# Patient Record
Sex: Male | Born: 1950
Health system: Southern US, Community
[De-identification: ages and names within clinical notes are randomized; demographics above are authoritative.]

## PROBLEM LIST (undated history)

## (undated) ENCOUNTER — Ambulatory Visit (HOSPITAL_BASED_OUTPATIENT_CLINIC_OR_DEPARTMENT_OTHER)

## (undated) DIAGNOSIS — C679 Malignant neoplasm of bladder, unspecified: Secondary | ICD-10-CM

## (undated) DIAGNOSIS — Z86711 Personal history of pulmonary embolism: Secondary | ICD-10-CM

## (undated) DIAGNOSIS — I420 Dilated cardiomyopathy: Secondary | ICD-10-CM

## (undated) HISTORY — PX: PROSTATE SURGERY: SHX751

## (undated) HISTORY — PX: CATARACT EXTRACTION: SUR2

## (undated) HISTORY — PX: WISDOM TOOTH EXTRACTION: SHX21

## (undated) HISTORY — DX: Malignant neoplasm of bladder, unspecified: C67.9

## (undated) HISTORY — PX: OTHER SURGICAL HISTORY: SHX169

## (undated) HISTORY — PX: TONSILLECTOMY: SUR1361

## (undated) HISTORY — DX: Dilated cardiomyopathy: I42.0

## (undated) HISTORY — DX: Personal history of pulmonary embolism: Z86.711

---

## 2001-04-19 HISTORY — PX: INSERTION PROSTATE RADIATION SEED: SUR718

## 2001-08-29 ENCOUNTER — Encounter: Payer: Self-pay | Admitting: Cardiovascular Disease

## 2014-04-23 ENCOUNTER — Ambulatory Visit
Admit: 2014-04-23 | Discharge: 2014-04-23 | Disposition: A | Discharge status: Home / Self Care | Payer: Self-pay | Attending: Cardiology | Admitting: Cardiology

## 2014-04-23 ENCOUNTER — Ambulatory Visit: Payer: Self-pay | Admitting: Cardiology

## 2014-04-23 ENCOUNTER — Encounter: Payer: Self-pay | Admitting: Cardiology

## 2014-04-23 VITALS — BP 148/82 | HR 66 | Ht 71.0 in | Wt 232.0 lb

## 2014-04-23 DIAGNOSIS — I10 Essential (primary) hypertension: Secondary | ICD-10-CM | POA: Insufficient documentation

## 2014-04-23 DIAGNOSIS — R972 Elevated prostate specific antigen [PSA]: Secondary | ICD-10-CM | POA: Insufficient documentation

## 2014-04-23 DIAGNOSIS — I82409 Acute embolism and thrombosis of unspecified deep veins of unspecified lower extremity: Secondary | ICD-10-CM | POA: Insufficient documentation

## 2014-04-23 DIAGNOSIS — I429 Cardiomyopathy, unspecified: Secondary | ICD-10-CM

## 2014-04-23 DIAGNOSIS — I2699 Other pulmonary embolism without acute cor pulmonale: Secondary | ICD-10-CM

## 2014-04-23 DIAGNOSIS — M1610 Unilateral primary osteoarthritis, unspecified hip: Secondary | ICD-10-CM | POA: Insufficient documentation

## 2014-04-23 LAB — ECHO COMPLETE
AV CWD Velocity (Peak): 138 cm/s
Aortic Arch Diameter: 3.62 cm
Aortic Diameter (mid tubular): 3.36 cm
Aortic Diameter (sinus of Valsalva): 3.2 cm
BMI: 32.4 kg/m2
BSA: 2.3 m2
Height: 71 in
LA Systolic Diameter: 4.12 cm
LV Posterior Wall Thickness: 0.92 cm
LV Septal Thickness: 0.99 cm
LVED Diameter: 5.85 cm
LVES Diameter: 4.62 cm
MV Peak A Velocity: 75.3 cm/s
MV Peak E Velocity: 84.7 cm/s
Weight: 3712 oz

## 2014-04-23 LAB — TIBC
Iron: 100 ug/dL (ref 45–170)
TIBC: 312 ug/dL (ref 250–450)
Transferrin Saturation: 32 % (ref 20–55)

## 2014-04-23 LAB — FERRITIN: Ferritin: 326 ng/mL — ABNORMAL HIGH (ref 20–250)

## 2014-04-23 MED ORDER — CARVEDILOL 3.125 MG PO TABS *I*
3.1250 mg | ORAL_TABLET | Freq: Two times a day (BID) | ORAL | Status: DC
Start: 2014-04-23 — End: 2014-08-16

## 2014-04-23 NOTE — Progress Notes (Signed)
Cardiology Office Revisit Note    Date of Visit: 04/23/2014 Patient: John Hale   Patients PCP: Rochele Pageshangkakoti, Narendra C, MD Patient DOB: [birth year]     Subjective/Reason For Visit     I had the pleasure of seeing John Hale in cardiology followup on 04/23/2014.   This is a very pleasant 64 year old gentleman here for followup.  In November of 2014 he had massive bilateral pulmonary embolism.  An echocardiogram done at that time showed and ejection fraction of 35%.  He also had RV failure.  He had IVC filter placed and he also was anticoagulated.  He took Xarelto for 6 months.  His thrombophilia workup was done by a hematologist and apparently was negative.    He does have an elevated PSA and is currently undergoing further evaluation for prostate cancer.  He had a stress echocardiogram in January of 2015.  His LVEF was 45%.  He had normal augmentation of the left ventricle with exercise.  There was no evidence of ischemic heart disease.    The patient has been doing well.  He had to take down trees this summer and he was able to complete strenuous tasks without any chest pain or shortness of breath.  He denies any palpitations syncopal or presyncopal episodes.    His blood pressure he says is usually well controlled.  [place] recently [place] from [place] to [place] and he feels this has resulted in slightly high blood pressure now.      PMHx:    The patient's past medical history significant for hypertension, asthma, prostate cancer, and  DVT and massive bilateral pulmonary embolism.  The patient's past surgical history is noncontributory.    ROS  Medications     Current Outpatient Prescriptions   Medication Sig    ADVAIR DISKUS 100-50 MCG/DOSE diskus inhaler Inhale 1 puff into the lungs 2 times daily    BUDESONIDE PO Take by mouth    lisinopril-hydrochlorothiazide (PRINZIDE,ZESTORETIC) 10-12.5 MG per tablet Take 1 tablet by mouth every morning    rivaroxaban (XARELTO) 20 MG tablet Take 20 mg by mouth  daily     Vitals and Physical Exam     Quintan's  height is 1.803 m (5\' 11" ) and weight is 105.235 kg (232 lb). His blood pressure is 148/82 and his pulse is 66.  Body mass index is 32.37 kg/(m^2).    Physical Exam   HEENT/Neck: No JVD LAD TM Bruit  Lungs: Bil AE adequate. No crackles or rhonchi.  Heart: S1 S2 normal LVS4+  PA: Soft, non tender.  Ext: No c/c/e.  CNS: No focal deficits.    Cardiac/Imaging Data     ECHO:                   Impression and Plan     Patient Active Problem List   Diagnosis Code    HTN (hypertension) I10    Elevated PSA R97.2    DVT (deep venous thrombosis) I82.409    Pulmonary embolism I26.99       This is an 64 y.o. male with  1. Bil Massive PE. Treated with Xarelto for 6 months.  Clinically doing well.  He has an IVC filter for his DVT.  According to the patient thrombophilia workup was negative.  2. Cardiomyopathy. His cardiomyopathy is very likely stress induced.I will do an echocardiogram now to confirm this her liver function has normalized.  This is a lady function continues to be decreased  will do workup  for nonischemic adenopathy.  He is NYHA class I at this time.  I don't see a need for beta blocker therapy at this time, unless his LV function is persistently depressed.   3. HTN.   4. Asthma. Stable.  Echocardiogram today.  Follow up with me in one year.         Danielle Dess, MD  Electronically signed on 04/23/2014 at 9:22 AM.    Addendum:  ECHO today  Mildly dilated left ventricle with mild LV systolic dysfunction. LVEF 45% with grade 2 diastolic dysfunction.   No significant valvular abnormalities.   Dilated left atrium.    LV l function is not completely recovered.  I will initiate workup for nonischemic cardiomyopathy.  I will see him back in 3 months.  I am starting him on beta blocker therapy

## 2014-04-24 LAB — PROTEIN ELECTROPHORESIS, SERUM
A/G Ratio: 1.4 (ref 0.9–1.8)
Albumin: 4.4 g/dL (ref 3.5–5.1)
Alpha 1: 0.4 g/dL (ref 0.2–0.4)
Alpha 2: 0.9 g/dL (ref 0.4–0.9)
Beta: 0.8 g/dL (ref 0.5–1.0)
Gamma: 1 g/dL (ref 0.7–1.4)
Interp,PE: NORMAL
Total Protein: 7.5 g/dL (ref 6.3–7.7)

## 2014-04-24 LAB — ANTINUCLEAR ANTIBODY SCREEN: ANA Screen: NEGATIVE

## 2014-04-25 LAB — PE ELECT,REVIEW

## 2014-05-10 ENCOUNTER — Ambulatory Visit: Payer: Self-pay | Admitting: Cardiology

## 2014-08-16 ENCOUNTER — Encounter: Payer: Self-pay | Admitting: Cardiology

## 2014-08-16 ENCOUNTER — Ambulatory Visit: Payer: Self-pay | Admitting: Cardiology

## 2014-08-16 VITALS — BP 138/82 | HR 62 | Ht 70.5 in | Wt 231.6 lb

## 2014-08-16 DIAGNOSIS — I429 Cardiomyopathy, unspecified: Secondary | ICD-10-CM

## 2014-08-16 MED ORDER — CARVEDILOL 6.25 MG PO TABS *I*
6.2500 mg | ORAL_TABLET | Freq: Two times a day (BID) | ORAL | Status: DC
Start: 2014-08-16 — End: 2015-07-25

## 2014-08-16 NOTE — Progress Notes (Signed)
Cardiology Office Revisit Note    Date of Visit: 08/16/2014 Patient: John Hale   Patients PCP: Rochele Pages, MD Patient DOB: 06-23-1950     Subjective/Reason For Visit     I had the pleasure of seeing John Hale in cardiology followup on 08/16/2014.   Background:  1.  November of 2014 he had massive bilateral pulmonary embolism. An echocardiogram done at that time showed and ejection fraction of 35%. He also had RV failure. He had IVC filter placed and he also was anticoagulated. He took Xarelto for 6 months. His thrombophilia workup was done by a hematologist is reported negative.He does have an elevated PSA and is currently undergoing further evaluation for prostate cancer.  2. He had a stress echocardiogram in January of 2015. His LVEF was 45%. He had normal augmentation of the left ventricle with exercise. There was no evidence of ischemic heart disease.  3. LVEF remains 45%. NICM work up negative.(ANA, SPEP, Iron studies).    This is a very pleasant 64 year old gentleman here for follow-up of his cardiomyopathy.  The patient is overall doing really well.  He denies any chest pain or shortness of breath.  He is in the process of moving to [place].  He denies any palpitations syncopal or presyncopal episodes.  Nothing to suggest sleep apnea either.  ROS  Medications     Current Outpatient Prescriptions   Medication Sig    budesonide (PULMICORT) 90 MCG/ACT flexhaler Inhale 1 puff into the lungs 2 times daily    ADVAIR DISKUS 100-50 MCG/DOSE diskus inhaler Inhale 1 puff into the lungs 2 times daily    lisinopril-hydrochlorothiazide (PRINZIDE,ZESTORETIC) 10-12.5 MG per tablet Take 1 tablet by mouth every morning    carvedilol (COREG) 3.125 MG tablet Take 1 tablet (3.125 mg total) by mouth 2 times daily (with meals)    BUDESONIDE PO Take by mouth    rivaroxaban (XARELTO) 20 MG tablet Take 20 mg by mouth daily     Vitals and Physical Exam     Mckyle's  height is 1.791 m  (5' 10.5") and weight is 105.053 kg (231 lb 9.6 oz). His blood pressure is 138/82 and his pulse is 62. His oxygen saturation is 98%.  Body mass index is 32.75 kg/(m^2).    Physical Exam   HEENT/Neck: No JVD LAD TM Bruit  Lungs: Bil AE adequate. No crackles or rhonchi.  Heart: S1 S2 normal No murmur or rub. No additional sounds.  PA: Soft, non tender.  Ext: No c/c/e.  CNS: No focal deficits.    Cardiac/Imaging Data          Echo Complete 04/23/2014    Narrative  Mildly dilated left ventricle with mild LV systolic dysfunction. LVEF   45% with grade 2 diastolic dysfunction.   No significant valvular abnormalities.   Dilated left atrium.           Impression and Plan     Patient Active Problem List   Diagnosis Code    HTN (hypertension) I10    Elevated PSA R97.2    DVT (deep venous thrombosis) I82.409    Pulmonary embolism I26.99       This is an 64 y.o. male with   1. Bil Massive PE. Treated with Xarelto for 6 months. Clinically doing well. He has an IVC filter for his DVT. Thrombophilia workup per his hematologist has been negative. His right ventricle function has normalized.  2. Cardiomyopathy.He had a significant LV systolic dysfunction diagnosis at  the time of his massive pulmonary embolism.  His ejection fraction was as low as 35%.  It has improved up to 45%.  He is on lisinopril and carvedilol.  I'm increasing his carvedilol to 6.25 mg twice a day.  He is euvolemic and NYHA class I without any symptoms of dyspnea on extreme exertion.  As far as his cardiomyopathy workup is concerned, he said a negative stress echocardiogram and suspicion for ischemic cardiomyopathy is very low. Nonischemic workup so far negative.  If his ejection fraction does not improve I was considering doing an MRI.  Since [place] is moving to [place] he can pursue this when he establishes with a primary care physician there.  3. HTN. Well-controlled.   4. Asthma. Stable.       Danielle DessVijay Breyson Kelm, MD  Electronically signed on  08/16/2014 at 12:09 PM.

## 2014-08-19 ENCOUNTER — Other Ambulatory Visit: Payer: Self-pay | Admitting: Urology

## 2015-07-25 ENCOUNTER — Other Ambulatory Visit: Payer: Self-pay | Admitting: Cardiology

## 2015-07-25 DIAGNOSIS — I429 Cardiomyopathy, unspecified: Secondary | ICD-10-CM

## 2015-11-28 DIAGNOSIS — J4520 Mild intermittent asthma, uncomplicated: Secondary | ICD-10-CM | POA: Diagnosis not present

## 2015-11-28 DIAGNOSIS — Z1389 Encounter for screening for other disorder: Secondary | ICD-10-CM | POA: Diagnosis not present

## 2015-11-28 DIAGNOSIS — C61 Malignant neoplasm of prostate: Secondary | ICD-10-CM | POA: Diagnosis not present

## 2015-11-28 DIAGNOSIS — Z9181 History of falling: Secondary | ICD-10-CM | POA: Diagnosis not present

## 2015-11-28 DIAGNOSIS — I10 Essential (primary) hypertension: Secondary | ICD-10-CM | POA: Diagnosis not present

## 2015-12-12 DIAGNOSIS — R9721 Rising PSA following treatment for malignant neoplasm of prostate: Secondary | ICD-10-CM | POA: Diagnosis not present

## 2015-12-12 DIAGNOSIS — C61 Malignant neoplasm of prostate: Secondary | ICD-10-CM | POA: Diagnosis not present

## 2015-12-12 DIAGNOSIS — N3000 Acute cystitis without hematuria: Secondary | ICD-10-CM | POA: Diagnosis not present

## 2015-12-18 DIAGNOSIS — I420 Dilated cardiomyopathy: Secondary | ICD-10-CM | POA: Diagnosis not present

## 2015-12-18 DIAGNOSIS — I10 Essential (primary) hypertension: Secondary | ICD-10-CM | POA: Insufficient documentation

## 2015-12-18 DIAGNOSIS — Z86711 Personal history of pulmonary embolism: Secondary | ICD-10-CM | POA: Insufficient documentation

## 2015-12-18 HISTORY — DX: Essential (primary) hypertension: I10

## 2015-12-18 HISTORY — DX: Personal history of pulmonary embolism: Z86.711

## 2015-12-24 DIAGNOSIS — S83204A Other tear of unspecified meniscus, current injury, left knee, initial encounter: Secondary | ICD-10-CM | POA: Diagnosis not present

## 2015-12-29 DIAGNOSIS — S8992XA Unspecified injury of left lower leg, initial encounter: Secondary | ICD-10-CM | POA: Diagnosis not present

## 2016-01-01 DIAGNOSIS — C61 Malignant neoplasm of prostate: Secondary | ICD-10-CM | POA: Diagnosis not present

## 2016-01-01 DIAGNOSIS — R9721 Rising PSA following treatment for malignant neoplasm of prostate: Secondary | ICD-10-CM | POA: Diagnosis not present

## 2016-01-02 DIAGNOSIS — I10 Essential (primary) hypertension: Secondary | ICD-10-CM | POA: Diagnosis not present

## 2016-01-02 DIAGNOSIS — Z86711 Personal history of pulmonary embolism: Secondary | ICD-10-CM | POA: Diagnosis not present

## 2016-01-02 DIAGNOSIS — I420 Dilated cardiomyopathy: Secondary | ICD-10-CM | POA: Diagnosis not present

## 2016-01-07 DIAGNOSIS — C61 Malignant neoplasm of prostate: Secondary | ICD-10-CM | POA: Diagnosis not present

## 2016-01-07 DIAGNOSIS — M5137 Other intervertebral disc degeneration, lumbosacral region: Secondary | ICD-10-CM | POA: Diagnosis not present

## 2016-01-21 ENCOUNTER — Other Ambulatory Visit (HOSPITAL_COMMUNITY): Payer: Self-pay | Admitting: Radiation Oncology

## 2016-01-21 DIAGNOSIS — C61 Malignant neoplasm of prostate: Secondary | ICD-10-CM

## 2016-01-23 DIAGNOSIS — I420 Dilated cardiomyopathy: Secondary | ICD-10-CM | POA: Diagnosis not present

## 2016-01-23 DIAGNOSIS — Z86711 Personal history of pulmonary embolism: Secondary | ICD-10-CM | POA: Diagnosis not present

## 2016-01-23 DIAGNOSIS — I10 Essential (primary) hypertension: Secondary | ICD-10-CM | POA: Diagnosis not present

## 2016-01-28 ENCOUNTER — Encounter (HOSPITAL_COMMUNITY): Admission: RE | Admit: 2016-01-28 | Payer: Medicare Other | Source: Ambulatory Visit

## 2016-01-28 ENCOUNTER — Other Ambulatory Visit (HOSPITAL_COMMUNITY): Payer: Self-pay | Admitting: Radiation Oncology

## 2016-01-28 DIAGNOSIS — C61 Malignant neoplasm of prostate: Secondary | ICD-10-CM

## 2016-02-10 ENCOUNTER — Ambulatory Visit (HOSPITAL_COMMUNITY): Admission: RE | Admit: 2016-02-10 | Payer: Medicare Other | Source: Ambulatory Visit

## 2016-02-16 DIAGNOSIS — Z86711 Personal history of pulmonary embolism: Secondary | ICD-10-CM | POA: Diagnosis not present

## 2016-02-16 DIAGNOSIS — I10 Essential (primary) hypertension: Secondary | ICD-10-CM | POA: Diagnosis not present

## 2016-02-16 DIAGNOSIS — I420 Dilated cardiomyopathy: Secondary | ICD-10-CM | POA: Diagnosis not present

## 2016-02-19 ENCOUNTER — Ambulatory Visit (HOSPITAL_COMMUNITY)
Admission: RE | Admit: 2016-02-19 | Discharge: 2016-02-19 | Disposition: A | Discharge status: Home / Self Care | Payer: Medicare Other | Source: Ambulatory Visit | Attending: Radiation Oncology | Admitting: Radiation Oncology

## 2016-02-19 DIAGNOSIS — C61 Malignant neoplasm of prostate: Secondary | ICD-10-CM | POA: Insufficient documentation

## 2016-02-19 DIAGNOSIS — R9721 Rising PSA following treatment for malignant neoplasm of prostate: Secondary | ICD-10-CM | POA: Diagnosis not present

## 2016-03-05 DIAGNOSIS — I10 Essential (primary) hypertension: Secondary | ICD-10-CM | POA: Diagnosis not present

## 2016-04-01 DIAGNOSIS — Z86711 Personal history of pulmonary embolism: Secondary | ICD-10-CM | POA: Diagnosis not present

## 2016-04-01 DIAGNOSIS — I420 Dilated cardiomyopathy: Secondary | ICD-10-CM | POA: Diagnosis not present

## 2016-04-01 DIAGNOSIS — I10 Essential (primary) hypertension: Secondary | ICD-10-CM | POA: Diagnosis not present

## 2016-04-13 DIAGNOSIS — C61 Malignant neoplasm of prostate: Secondary | ICD-10-CM | POA: Diagnosis not present

## 2016-04-13 DIAGNOSIS — N3000 Acute cystitis without hematuria: Secondary | ICD-10-CM | POA: Diagnosis not present

## 2016-04-13 DIAGNOSIS — R972 Elevated prostate specific antigen [PSA]: Secondary | ICD-10-CM | POA: Diagnosis not present

## 2016-08-06 DIAGNOSIS — C61 Malignant neoplasm of prostate: Secondary | ICD-10-CM | POA: Diagnosis not present

## 2016-08-06 DIAGNOSIS — Z0000 Encounter for general adult medical examination without abnormal findings: Secondary | ICD-10-CM | POA: Diagnosis not present

## 2016-08-06 DIAGNOSIS — I10 Essential (primary) hypertension: Secondary | ICD-10-CM | POA: Diagnosis not present

## 2016-09-03 DIAGNOSIS — Z86711 Personal history of pulmonary embolism: Secondary | ICD-10-CM | POA: Diagnosis not present

## 2016-09-03 DIAGNOSIS — I10 Essential (primary) hypertension: Secondary | ICD-10-CM | POA: Diagnosis not present

## 2016-09-03 DIAGNOSIS — I420 Dilated cardiomyopathy: Secondary | ICD-10-CM | POA: Diagnosis not present

## 2016-09-07 DIAGNOSIS — I420 Dilated cardiomyopathy: Secondary | ICD-10-CM | POA: Diagnosis not present

## 2016-10-12 DIAGNOSIS — N3000 Acute cystitis without hematuria: Secondary | ICD-10-CM | POA: Diagnosis not present

## 2016-10-12 DIAGNOSIS — R972 Elevated prostate specific antigen [PSA]: Secondary | ICD-10-CM | POA: Diagnosis not present

## 2016-10-12 DIAGNOSIS — C61 Malignant neoplasm of prostate: Secondary | ICD-10-CM | POA: Diagnosis not present

## 2017-01-10 IMAGING — PT NM PET TUM IMG RESTAG (PS) SKULL BASE T - THIGH
1 of 7 series · 3 of 25 positions shown · non-contrast
Comparison: None.

CLINICAL DATA: Prostate carcinoma with rising PSA. Brachytherapy
seed implantation in the prostate gland (7448).

EXAM:
NUCLEAR MEDICINE PET SKULL BASE TO THIGH
TECHNIQUE: 10.3 mCi F-18 Fluciclovine was injected intravenously. Full-ring PET
imaging was performed from the skull base to thigh after the
radiotracer. CT data was obtained and used for attenuation
correction and anatomic localization.

[Series 4: ct sk_thigh 5.0 b31f · axial · 5.0mm · 0.98mm/px · z∈[+988,+1372]mm · 3 of 240 slices shown]
[im 48/240  brain]
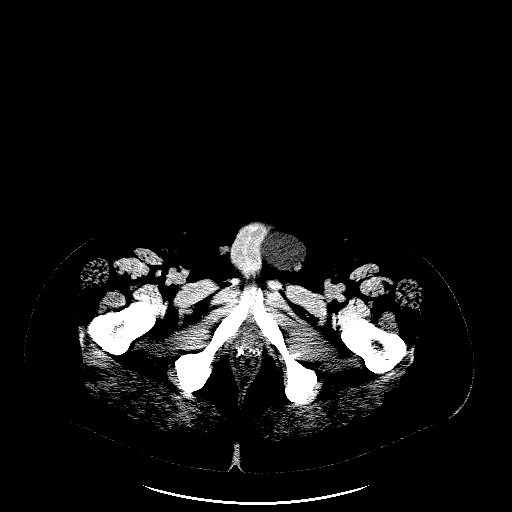
[im 96/240  brain]
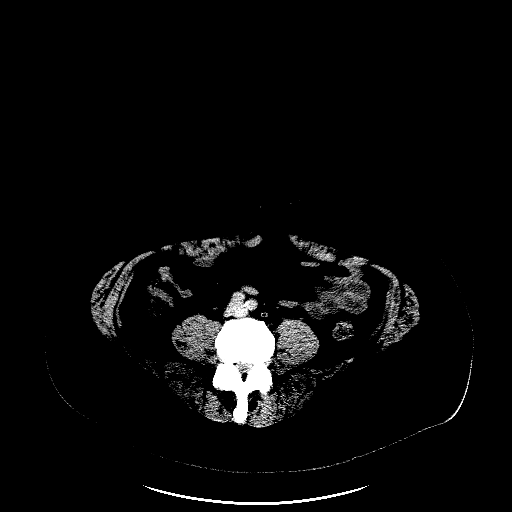
[im 144/240  brain]
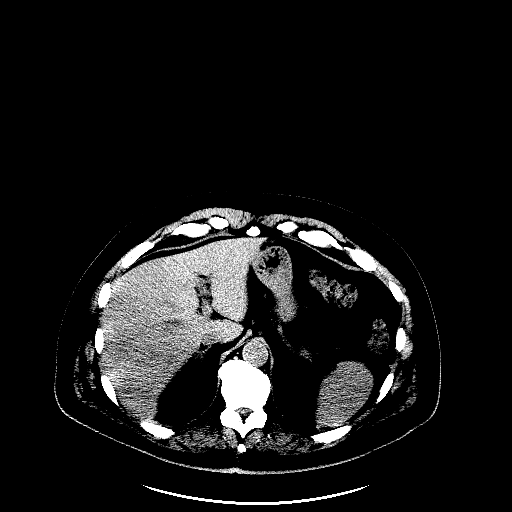

[3 of 25 positions shown; findings below may reference images not displayed]

FINDINGS: NECK

No radiotracer activity in neck lymph nodes.

CHEST

No radiotracer accumulation within mediastinal or hilar lymph nodes.
No suspicious pulmonary nodules on the CT scan. Symmetric
gynecomastia noted.

ABDOMEN/PELVIS

No significant metabolic activity in the prostate bed. Brachytherapy
seeds noted. Small amount of activity at the most inferior aspect of
the prostate / urethra is is favored physiologic.

No accumulation radiotracer in pelvic lymph nodes. No accumulates
radiotracer within periaortic lymph nodes. No liver metastasis.
Physiologic activity noted within the liver and pancreas.

Infrarenal IVC filter noted.  Large LEFT hydrocele noted.

SKELETON

No focal activity to suggest skeletal metastasis. This intense
uptake within the muscles. Query recent exercise.
IMPRESSION: 1. No evidence of local recurrence in the prostate gland.
2. No evidence of nodal metastasis pelvis.
3. No evidence distant loss of disease.
4. No evidence skeletal metastasis.

## 2017-04-14 ENCOUNTER — Other Ambulatory Visit: Payer: Self-pay | Admitting: Cardiology

## 2017-04-21 DIAGNOSIS — C61 Malignant neoplasm of prostate: Secondary | ICD-10-CM | POA: Diagnosis not present

## 2017-04-21 DIAGNOSIS — N3020 Other chronic cystitis without hematuria: Secondary | ICD-10-CM | POA: Diagnosis not present

## 2017-04-22 ENCOUNTER — Ambulatory Visit (HOSPITAL_BASED_OUTPATIENT_CLINIC_OR_DEPARTMENT_OTHER)
Admission: RE | Admit: 2017-04-22 | Discharge: 2017-04-22 | Disposition: A | Discharge status: Home / Self Care | Payer: Medicare Other | Source: Ambulatory Visit | Attending: Cardiology | Admitting: Cardiology

## 2017-04-22 ENCOUNTER — Ambulatory Visit (INDEPENDENT_AMBULATORY_CARE_PROVIDER_SITE_OTHER): Payer: Medicare Other | Admitting: Cardiology

## 2017-04-22 ENCOUNTER — Ambulatory Visit (HOSPITAL_BASED_OUTPATIENT_CLINIC_OR_DEPARTMENT_OTHER): Payer: Medicare Other

## 2017-04-22 ENCOUNTER — Encounter: Payer: Self-pay | Admitting: Cardiology

## 2017-04-22 VITALS — BP 120/66 | HR 68 | Ht 71.0 in | Wt 239.8 lb

## 2017-04-22 DIAGNOSIS — I10 Essential (primary) hypertension: Secondary | ICD-10-CM

## 2017-04-22 DIAGNOSIS — I82412 Acute embolism and thrombosis of left femoral vein: Secondary | ICD-10-CM | POA: Diagnosis not present

## 2017-04-22 DIAGNOSIS — Z86718 Personal history of other venous thrombosis and embolism: Secondary | ICD-10-CM

## 2017-04-22 DIAGNOSIS — I420 Dilated cardiomyopathy: Secondary | ICD-10-CM | POA: Diagnosis not present

## 2017-04-22 DIAGNOSIS — Z86711 Personal history of pulmonary embolism: Secondary | ICD-10-CM | POA: Diagnosis not present

## 2017-04-22 HISTORY — DX: Personal history of other venous thrombosis and embolism: Z86.718

## 2017-04-22 MED ORDER — RIVAROXABAN 20 MG PO TABS: 20.0000 mg | ORAL_TABLET | Freq: Every day | ORAL | 6 refills | Status: DC

## 2017-04-22 NOTE — Progress Notes (Addendum)
Cardiology Office Note:    Date:  04/22/2017   ID:  Allen Macdonald, DOB 1951-04-05, MRN [id number]  PCP:  Ronita Hipps, MD  Cardiologist:  Jenne Campus, MD    Referring MD: Ronita Hipps, MD   Chief Complaint  Patient presents with  . Follow-up  I am doing well  History of Present Illness:    Allen Macdonald is a 67 y.o. male with history of dilated cardiomyopathy.  He is on ACE inhibitor as well beta-blocker.  Comes for regular follow-up.  Overall he said he is doing fine however about 2 weeks ago he noted swelling of left lower extremity with some kind of strength sensation in his leg.  Years ago he had a DVT in this leg and had fairly similar symptoms.  He actually ended up having complication of DVT.  He did have PE.  He was anticoagulated for about a year after that.  [place] filter has been implanted and since that time he has been doing well.  Luckily denies having any shortness of breath, denies having any pleuritic chest pain.  Denies having any proximal nocturnal dyspnea no palpitations no dizziness  Past Medical History:  Diagnosis Date  . Dilated cardiomyopathy ([place])   . History of pulmonary embolus ([place])       Current Medications: Current Meds  Medication Sig  . bicalutamide (CASODEX) 50 MG tablet Take 50 mg by mouth daily.  . carvedilol (COREG) 6.25 MG tablet Take 6.25 mg by mouth 2 (two) times daily.  . fluticasone (FLOVENT HFA) 110 MCG/ACT inhaler Inhale 1 puff into the lungs 2 (two) times daily.  Marland Kitchen lisinopril (PRINIVIL,ZESTRIL) 20 MG tablet TAKE ONE TABLET EVERY DAY     Allergies:   Patient has no known allergies.   Social History   Socioeconomic History  . Marital status: Married    Spouse name: None  . Number of children: None  . Years of education: None  . Highest education level: None  Social Needs  . Financial resource strain: None  . Food insecurity - worry: None  . Food insecurity - inability: None  . Transportation needs - medical:  None  . Transportation needs - non-medical: None  Occupational History  . None  Tobacco Use  . Smoking status: Never Smoker  . Smokeless tobacco: Never Used  Substance and Sexual Activity  . Alcohol use: Yes  . Drug use: No  . Sexual activity: None  Other Topics Concern  . None  Social History Narrative  . None     Family History: The patient's family history includes Heart failure in his mother; Pleurisy in his mother. ROS:   Please see the history of present illness.    All 14 point review of systems negative except as described per history of present illness  EKGs/Labs/Other Studies Reviewed:      Recent Labs: No results found for requested labs within last 8760 hours.  Recent Lipid Panel No results found for: CHOL, TRIG, HDL, CHOLHDL, VLDL, LDLCALC, LDLDIRECT  Physical Exam:    VS:  BP 120/66   Pulse 68   Ht 5\' 11"  (1.803 m)   Wt 239 lb 12.8 oz (108.8 kg)   SpO2 96%   BMI 33.45 kg/m     Wt Readings from Last 3 Encounters:  04/22/17 239 lb 12.8 oz (108.8 kg)     GEN:  Well nourished, well developed in no acute distress HEENT: Normal NECK: No JVD; No carotid bruits LYMPHATICS: No lymphadenopathy CARDIAC:  RRR, no murmurs, no rubs, no gallops RESPIRATORY:  Clear to auscultation without rales, wheezing or rhonchi  ABDOMEN: Soft, non-tender, non-distended MUSCULOSKELETAL:  No edema; No deformity  SKIN: Warm and dry LOWER EXTREMITIES: His left leg got 2+ pitting edema.  There is some tenderness in the calf NEUROLOGIC:  Alert and oriented x 3 PSYCHIATRIC:  Normal affect   ASSESSMENT:    1. Dilated cardiomyopathy ([place])   2. Essential hypertension   3. History of pulmonary embolism   4. History of DVT (deep vein thrombosis)    PLAN:    In order of problems listed above:  1. Dilated cardiomyopathy: On ACE inhibitor and beta-blocker.  In May he had echocardiogram done in our old office will try to retrieve the results of the test.  He was not informed  about the results of the test.  If his ejection fraction is normal will continue present management if not we may consider switching him from ACE inhibitor to [facility]. 2. Essential hypertension: Blood pressure appears to be well controlled continue present management. 3. History of pulmonary emboli years ago.  Stable from that standpoint review. 4. History of deep venous thrombosis in the left lower extremities now new symptoms with swelling as well as pain in left calf.  That the calf is obviously swollen.  We must rule out DVT will do stat ultrasound study of this leg.  If it is positive will initiate his Xarelto 15 mg twice daily.  I will check his Chem-7 today make sure that the dose of this medication will be appropriate.  D-dimer will be done as well.   Of lower extremity was performed which showed acute and some chronic DVT.  I start him on Xarelto 15 mg twice daily.  I will send prescription for 20 mg daily thereafter.  I see him back in 1 month.  Laboratory tests are still pending  Medication Adjustments/Labs and Tests Ordered: Current medicines are reviewed at length with the patient today.  Concerns regarding medicines are outlined above.  No orders of the defined types were placed in this encounter.  Medication changes: No orders of the defined types were placed in this encounter.   Signed, Park Liter, MD, [facility] 04/22/2017 9:57 AM    [place]

## 2017-04-22 NOTE — Progress Notes (Signed)
  Lower extremity venous duplex. Unilateral (left) Positive for DVT. Dr. Agustin Cree was notified/ Patient was informed.  Joelene Millin 04/22/2017, 1:18 PM

## 2017-04-22 NOTE — Patient Instructions (Signed)
Medication Instructions:  Your physician has recommended you make the following change in your medication: Pending results from labs, Echo from our old office and your vascular study  Labwork: Your physician recommends that you have lab work today: BMP, CBC, and D-Dimer  Testing/Procedures: Your physician has requested that you have a lower or upper extremity venous duplex. This test is an ultrasound of the veins in the legs or arms. It looks at venous blood flow that carries blood from the heart to the legs or arms. Allow one hour for a Lower Venous exam. Allow thirty minutes for an Upper Venous exam. There are no restrictions or special instructions.  Follow-Up: Your physician recommends that you schedule a follow-up appointment in: 1 month with Dr. Agustin Cree  Any Other Special Instructions Will Be Listed Below (If Applicable).     If you need a refill on your cardiac medications before your next appointment, please call your pharmacy.

## 2017-04-22 NOTE — Addendum Note (Signed)
Addended by: Jenne Campus on: 04/22/2017 01:39 PM   Modules accepted: Orders

## 2017-04-22 NOTE — Addendum Note (Signed)
Addended by: Aleatha Borer on: 04/22/2017 10:18 AM   Modules accepted: Orders

## 2017-04-23 LAB — BASIC METABOLIC PANEL
BUN / CREAT RATIO: 16 (ref 10–24)
BUN: 18 mg/dL (ref 8–27)
CO2: 21 mmol/L (ref 20–29)
CREATININE: 1.15 mg/dL (ref 0.76–1.27)
Calcium: 9.8 mg/dL (ref 8.6–10.2)
Chloride: 103 mmol/L (ref 96–106)
GFR calc Af Amer: 75 mL/min/{1.73_m2} (ref >59)
GFR, EST NON AFRICAN AMERICAN: 65 mL/min/{1.73_m2} (ref >59)
GLUCOSE: 95 mg/dL (ref 65–99)
Potassium: 5.2 mmol/L (ref 3.5–5.2)
Sodium: 141 mmol/L (ref 134–144)

## 2017-04-23 LAB — D-DIMER, QUANTITATIVE: D-DIMER: 3.82 mg/L FEU — ABNORMAL HIGH (ref 0.00–0.49)

## 2017-04-23 LAB — CBC
HEMOGLOBIN: 13.5 g/dL (ref 13.0–17.7)
Hematocrit: 39.2 % (ref 37.5–51.0)
MCH: 32.5 pg (ref 26.6–33.0)
MCHC: 34.4 g/dL (ref 31.5–35.7)
MCV: 95 fL (ref 79–97)
Platelets: 164 10*3/uL (ref 150–379)
RBC: 4.15 x10E6/uL (ref 4.14–5.80)
RDW: 14 % (ref 12.3–15.4)
WBC: 6.7 10*3/uL (ref 3.4–10.8)

## 2017-04-26 ENCOUNTER — Other Ambulatory Visit: Payer: Self-pay | Admitting: Cardiology

## 2017-04-27 DIAGNOSIS — I10 Essential (primary) hypertension: Secondary | ICD-10-CM | POA: Diagnosis not present

## 2017-04-27 DIAGNOSIS — I82409 Acute embolism and thrombosis of unspecified deep veins of unspecified lower extremity: Secondary | ICD-10-CM | POA: Diagnosis not present

## 2017-04-27 DIAGNOSIS — Z1331 Encounter for screening for depression: Secondary | ICD-10-CM | POA: Diagnosis not present

## 2017-04-27 DIAGNOSIS — Z9181 History of falling: Secondary | ICD-10-CM | POA: Diagnosis not present

## 2017-04-27 DIAGNOSIS — J4520 Mild intermittent asthma, uncomplicated: Secondary | ICD-10-CM | POA: Diagnosis not present

## 2017-05-16 ENCOUNTER — Encounter: Payer: Self-pay | Admitting: Cardiology

## 2017-05-16 ENCOUNTER — Ambulatory Visit: Payer: Medicare Other | Admitting: Cardiology

## 2017-05-16 VITALS — BP 124/70 | HR 55 | Ht 71.0 in | Wt 239.0 lb

## 2017-05-16 DIAGNOSIS — Z86711 Personal history of pulmonary embolism: Secondary | ICD-10-CM

## 2017-05-16 DIAGNOSIS — I10 Essential (primary) hypertension: Secondary | ICD-10-CM

## 2017-05-16 DIAGNOSIS — Z86718 Personal history of other venous thrombosis and embolism: Secondary | ICD-10-CM

## 2017-05-16 DIAGNOSIS — I420 Dilated cardiomyopathy: Secondary | ICD-10-CM

## 2017-05-16 MED ORDER — RIVAROXABAN 20 MG PO TABS: 20.0000 mg | ORAL_TABLET | Freq: Every day | ORAL | 3 refills | Status: DC

## 2017-05-16 NOTE — Patient Instructions (Signed)
Medication Instructions:  Your physician recommends that you continue on your current medications as directed. Please refer to the Current Medication list given to you today.  Labwork: None ordered  Testing/Procedures: None ordered  Follow-Up: Your physician recommends that you schedule a follow-up appointment in: 3 months with Dr. Krasowski   Any Other Special Instructions Will Be Listed Below (If Applicable).     If you need a refill on your cardiac medications before your next appointment, please call your pharmacy.   

## 2017-05-16 NOTE — Addendum Note (Signed)
Addended by: Aleatha Borer on: 05/16/2017 10:41 AM   Modules accepted: Orders

## 2017-05-16 NOTE — Progress Notes (Signed)
Cardiology Office Note:    Date:  05/16/2017   ID:  Allen Macdonald, DOB 10/03/1950, MRN [id number]  PCP:  Ronita Hipps, MD  Cardiologist:  Jenne Campus, MD    Referring MD: Ronita Hipps, MD   Chief Complaint  Patient presents with  . Follow-up    1 month flup after venous doppler  Doing well  History of Present Illness:    Allen Macdonald is a 67 y.o. male with history of central recognize acute DVT.  He did have DVT and PE couple years ago but this is a new occurrence of DVT.  Is being appropriately treated with anticoagulation and doing well.  His leg is not hurting any more he walks climb stairs with no difficulties no chest pain tightness squeezing pressure burning in chest.  Past Medical History:  Diagnosis Date  . Dilated cardiomyopathy ([place])   . History of pulmonary embolus ([place])     Past Surgical History:  Procedure Laterality Date  . PROSTATE SURGERY    . Venacava Filter      Current Medications: Current Meds  Medication Sig  . bicalutamide (CASODEX) 50 MG tablet Take 50 mg by mouth daily.  . carvedilol (COREG) 6.25 MG tablet Take 6.25 mg by mouth 2 (two) times daily.  . fluticasone (FLOVENT HFA) 110 MCG/ACT inhaler Inhale 1 puff into the lungs 2 (two) times daily.  Marland Kitchen lisinopril (PRINIVIL,ZESTRIL) 20 MG tablet TAKE ONE TABLET BY MOUTH EVERY DAY  . rivaroxaban (XARELTO) 20 MG TABS tablet Take 1 tablet (20 mg total) by mouth daily with supper.     Allergies:   Patient has no known allergies.   Social History   Socioeconomic History  . Marital status: Married    Spouse name: None  . Number of children: None  . Years of education: None  . Highest education level: None  Social Needs  . Financial resource strain: None  . Food insecurity - worry: None  . Food insecurity - inability: None  . Transportation needs - medical: None  . Transportation needs - non-medical: None  Occupational History  . None  Tobacco Use  . Smoking status: Never Smoker    . Smokeless tobacco: Never Used  Substance and Sexual Activity  . Alcohol use: Yes  . Drug use: No  . Sexual activity: None  Other Topics Concern  . None  Social History Narrative  . None     Family History: The patient's family history includes Heart failure in his mother; Pleurisy in his mother. ROS:   Please see the history of present illness.    All 14 point review of systems negative except as described per history of present illness  EKGs/Labs/Other Studies Reviewed:      Recent Labs: 04/22/2017: BUN 18; Creatinine, Ser 1.15; Hemoglobin 13.5; Platelets 164; Potassium 5.2; Sodium 141  Recent Lipid Panel No results found for: CHOL, TRIG, HDL, CHOLHDL, VLDL, LDLCALC, LDLDIRECT  Physical Exam:    VS:  BP 124/70 (BP Location: Left Arm, Patient Position: Sitting, Cuff Size: Large)   Pulse (!) 55   Ht 5\' 11"  (1.803 m)   Wt 239 lb (108.4 kg)   SpO2 98%   BMI 33.33 kg/m     Wt Readings from Last 3 Encounters:  05/16/17 239 lb (108.4 kg)  04/22/17 239 lb 12.8 oz (108.8 kg)     GEN:  Well nourished, well developed in no acute distress HEENT: Normal NECK: No JVD; No carotid bruits LYMPHATICS: No lymphadenopathy CARDIAC:  RRR, no murmurs, no rubs, no gallops RESPIRATORY:  Clear to auscultation without rales, wheezing or rhonchi  ABDOMEN: Soft, non-tender, non-distended MUSCULOSKELETAL:  No edema; No deformity  SKIN: Warm and dry LOWER EXTREMITIES: no swelling NEUROLOGIC:  Alert and oriented x 3 PSYCHIATRIC:  Normal affect   ASSESSMENT:    1. Dilated cardiomyopathy ([place])   2. Essential hypertension   3. History of DVT (deep vein thrombosis)   4. History of pulmonary embolism    PLAN:    In order of problems listed above:  1. Dilated cardia myopathy: Still I do not have a report of his echocardiogram from May we will try to re-send request for him again.  He is on ACE inhibitor and beta-blocker which I will continue 2. Essential hypertension: Blood pressure  well controlled. 3. Acute DVT.  On anticoagulation which I will continue.  Now since we have unprovoked DVT he need to be on anticoagulation indefinitely   Medication Adjustments/Labs and Tests Ordered: Current medicines are reviewed at length with the patient today.  Concerns regarding medicines are outlined above.  No orders of the defined types were placed in this encounter.  Medication changes: No orders of the defined types were placed in this encounter.   Signed, Park Liter, MD, [facility] 05/16/2017 10:18 AM    [place]

## 2017-06-21 ENCOUNTER — Telehealth: Payer: Self-pay | Admitting: *Deleted

## 2017-06-21 NOTE — Telephone Encounter (Signed)
Left message with wife, Santiago Glad, to have patient call back.

## 2017-06-21 NOTE — Telephone Encounter (Signed)
Patient and wife, Santiago Glad, informed about [facility] financial assistance program for xarelto and where to go for more information. Both verbalized understanding. No further questions. Scheduled follow up appointment for 08/15/17.

## 2017-08-01 ENCOUNTER — Telehealth: Payer: Self-pay | Admitting: Cardiology

## 2017-08-01 NOTE — Telephone Encounter (Signed)
Dr. Agustin Cree advised to continue xarelto. Patient informed. No further questions.

## 2017-08-01 NOTE — Telephone Encounter (Signed)
Patient is having tooth extraction tomorrow, 08/02/2017 and wants to make sure that is okay with him taking xarelto 20 mg daily. Will have Dr. Agustin Cree advise.

## 2017-08-01 NOTE — Telephone Encounter (Signed)
Please contact patient about dental surgery

## 2017-08-11 DIAGNOSIS — K048 Radicular cyst: Secondary | ICD-10-CM | POA: Diagnosis not present

## 2017-08-11 DIAGNOSIS — M279 Disease of jaws, unspecified: Secondary | ICD-10-CM | POA: Diagnosis not present

## 2017-08-15 ENCOUNTER — Ambulatory Visit: Payer: Medicare Other | Admitting: Cardiology

## 2017-08-15 ENCOUNTER — Encounter: Payer: Self-pay | Admitting: Cardiology

## 2017-08-15 VITALS — BP 110/60 | HR 64 | Ht 71.0 in | Wt 235.8 lb

## 2017-08-15 DIAGNOSIS — Z86711 Personal history of pulmonary embolism: Secondary | ICD-10-CM

## 2017-08-15 DIAGNOSIS — Z86718 Personal history of other venous thrombosis and embolism: Secondary | ICD-10-CM | POA: Diagnosis not present

## 2017-08-15 DIAGNOSIS — I420 Dilated cardiomyopathy: Secondary | ICD-10-CM | POA: Diagnosis not present

## 2017-08-15 MED ORDER — RIVAROXABAN 20 MG PO TABS: 20.0000 mg | ORAL_TABLET | Freq: Every day | ORAL | 3 refills | Status: DC

## 2017-08-15 NOTE — Progress Notes (Signed)
Cardiology Office Note:    Date:  08/15/2017   ID:  Allen Macdonald, DOB [birth year], MRN [id number]  PCP:  Ronita Hipps, MD  Cardiologist:  Jenne Campus, MD    Referring MD: Ronita Hipps, MD   Chief Complaint  Patient presents with  . Follow-up  Doing well  History of Present Illness:    Allen Macdonald is a 67 y.o. male with cardia myopathy ejection fraction 45 to 50% last May.  On beta-blocker ACE inhibitor.  Also history of DVT as well as PE.  Overall seems to be doing clinically well no tightness squeezing pressure burning chest works a lot in the garden with no difficulties.  Past Medical History:  Diagnosis Date  . Dilated cardiomyopathy ([place])   . History of pulmonary embolus (PE)     Past Surgical History:  Procedure Laterality Date  . PROSTATE SURGERY    . Venacava Filter      Current Medications: Current Meds  Medication Sig  . bicalutamide (CASODEX) 50 MG tablet Take 50 mg by mouth daily.  . carvedilol (COREG) 6.25 MG tablet Take 6.25 mg by mouth 2 (two) times daily.  . fluticasone (FLOVENT HFA) 110 MCG/ACT inhaler Inhale 1 puff into the lungs 2 (two) times daily.  Marland Kitchen lisinopril (PRINIVIL,ZESTRIL) 20 MG tablet TAKE ONE TABLET BY MOUTH EVERY DAY  . rivaroxaban (XARELTO) 20 MG TABS tablet Take 1 tablet (20 mg total) by mouth daily with supper.     Allergies:   Patient has no known allergies.   Social History   Socioeconomic History  . Marital status: Married    Spouse name: Not on file  . Number of children: Not on file  . Years of education: Not on file  . Highest education level: Not on file  Occupational History  . Not on file  Social Needs  . Financial resource strain: Not on file  . Food insecurity:    Worry: Not on file    Inability: Not on file  . Transportation needs:    Medical: Not on file    Non-medical: Not on file  Tobacco Use  . Smoking status: Never Smoker  . Smokeless tobacco: Never Used  Substance and Sexual Activity  .  Alcohol use: Yes  . Drug use: No  . Sexual activity: Not on file  Lifestyle  . Physical activity:    Days per week: Not on file    Minutes per session: Not on file  . Stress: Not on file  Relationships  . Social connections:    Talks on phone: Not on file    Gets together: Not on file    Attends religious service: Not on file    Active member of club or organization: Not on file    Attends meetings of clubs or organizations: Not on file    Relationship status: Not on file  Other Topics Concern  . Not on file  Social History Narrative  . Not on file     Family History: The patient's family history includes Heart failure in his mother; Pleurisy in his mother. ROS:   Please see the history of present illness.    All 14 point review of systems negative except as described per history of present illness  EKGs/Labs/Other Studies Reviewed:      Recent Labs: 04/22/2017: BUN 18; Creatinine, Ser 1.15; Hemoglobin 13.5; Platelets 164; Potassium 5.2; Sodium 141  Recent Lipid Panel No results found for: CHOL, TRIG, HDL, CHOLHDL, VLDL, LDLCALC, LDLDIRECT  Physical Exam:    VS:  BP 110/60   Pulse 64   Ht 5\' 11"  (1.803 m)   Wt 235 lb 12.8 oz (107 kg)   SpO2 98%   BMI 32.89 kg/m     Wt Readings from Last 3 Encounters:  08/15/17 235 lb 12.8 oz (107 kg)  05/16/17 239 lb (108.4 kg)  04/22/17 239 lb 12.8 oz (108.8 kg)     GEN:  Well nourished, well developed in no acute distress HEENT: Normal NECK: No JVD; No carotid bruits LYMPHATICS: No lymphadenopathy CARDIAC: RRR, no murmurs, no rubs, no gallops RESPIRATORY:  Clear to auscultation without rales, wheezing or rhonchi  ABDOMEN: Soft, non-tender, non-distended MUSCULOSKELETAL:  No edema; No deformity  SKIN: Warm and dry LOWER EXTREMITIES: no swelling NEUROLOGIC:  Alert and oriented x 3 PSYCHIATRIC:  Normal affect   ASSESSMENT:    1. Dilated cardiomyopathy ([place])   2. History of DVT (deep vein thrombosis)   3. History of  pulmonary embolism    PLAN:    In order of problems listed above:  1. Dilated cardiomyopathy with ejection fraction 45-50 last year.  We will repeat echocardiogram to recheck left ventricular ejection fraction in the meantime we will continue with beta-blocker and ACE inhibitor. 2. History of DVT and PE, clinically doing well I will get echocardiogram to reassess right ventricle size and function we will continue anticoagulation 3. History of PE: As above.  I see him back in 6 months sooner if he has a problem   Medication Adjustments/Labs and Tests Ordered: Current medicines are reviewed at length with the patient today.  Concerns regarding medicines are outlined above.  No orders of the defined types were placed in this encounter.  Medication changes: No orders of the defined types were placed in this encounter.   Signed, Park Liter, MD, [facility] 08/15/2017 9:22 AM    [place]

## 2017-08-15 NOTE — Patient Instructions (Addendum)
Medication Instructions:  Your physician recommends that you continue on your current medications as directed. Please refer to the Current Medication list given to you today.   Labwork: None  Testing/Procedures: Your physician has requested that you have an echocardiogram. Echocardiography is a painless test that uses sound waves to create images of your heart. It provides your doctor with information about the size and shape of your heart and how well your heart's chambers and valves are working. This procedure takes approximately one hour. There are no restrictions for this procedure. This will be done in the new [place] office. You will be contacted to schedule this test.   Follow-Up: Your physician wants you to follow-up in: 6 months. You will receive a reminder letter in the mail two months in advance. If you don't receive a letter, please call our office to schedule the follow-up appointment.   Any Other Special Instructions Will Be Listed Below (If Applicable).     If you need a refill on your cardiac medications before your next appointment, please call your pharmacy.

## 2017-10-24 DIAGNOSIS — N3020 Other chronic cystitis without hematuria: Secondary | ICD-10-CM | POA: Diagnosis not present

## 2017-10-31 ENCOUNTER — Ambulatory Visit (INDEPENDENT_AMBULATORY_CARE_PROVIDER_SITE_OTHER): Payer: Medicare Other

## 2017-10-31 ENCOUNTER — Other Ambulatory Visit: Payer: Self-pay

## 2017-10-31 DIAGNOSIS — I420 Dilated cardiomyopathy: Secondary | ICD-10-CM | POA: Diagnosis not present

## 2017-10-31 DIAGNOSIS — Z86718 Personal history of other venous thrombosis and embolism: Secondary | ICD-10-CM | POA: Diagnosis not present

## 2017-10-31 DIAGNOSIS — Z86711 Personal history of pulmonary embolism: Secondary | ICD-10-CM

## 2017-10-31 NOTE — Progress Notes (Signed)
Echocardiogram Complete   [place]

## 2017-11-01 DIAGNOSIS — C61 Malignant neoplasm of prostate: Secondary | ICD-10-CM | POA: Diagnosis not present

## 2017-11-01 DIAGNOSIS — Z0000 Encounter for general adult medical examination without abnormal findings: Secondary | ICD-10-CM | POA: Diagnosis not present

## 2017-11-01 DIAGNOSIS — I10 Essential (primary) hypertension: Secondary | ICD-10-CM | POA: Diagnosis not present

## 2017-11-01 DIAGNOSIS — I420 Dilated cardiomyopathy: Secondary | ICD-10-CM | POA: Diagnosis not present

## 2017-12-02 DIAGNOSIS — H524 Presbyopia: Secondary | ICD-10-CM | POA: Diagnosis not present

## 2017-12-02 DIAGNOSIS — H43813 Vitreous degeneration, bilateral: Secondary | ICD-10-CM | POA: Diagnosis not present

## 2018-04-03 ENCOUNTER — Ambulatory Visit: Payer: Medicare Other | Admitting: Cardiology

## 2018-04-03 ENCOUNTER — Encounter: Payer: Self-pay | Admitting: Cardiology

## 2018-04-03 VITALS — BP 118/64 | HR 61 | Ht 71.0 in | Wt 244.6 lb

## 2018-04-03 DIAGNOSIS — Z86718 Personal history of other venous thrombosis and embolism: Secondary | ICD-10-CM

## 2018-04-03 DIAGNOSIS — I420 Dilated cardiomyopathy: Secondary | ICD-10-CM | POA: Diagnosis not present

## 2018-04-03 DIAGNOSIS — I10 Essential (primary) hypertension: Secondary | ICD-10-CM

## 2018-04-03 DIAGNOSIS — Z86711 Personal history of pulmonary embolism: Secondary | ICD-10-CM | POA: Diagnosis not present

## 2018-04-03 NOTE — Patient Instructions (Signed)
Medication Instructions:  Your physician recommends that you continue on your current medications as directed. Please refer to the Current Medication list given to you today.  If you need a refill on your cardiac medications before your next appointment, please call your pharmacy.   Lab work: None ordered If you have labs (blood work) drawn today and your tests are completely normal, you will receive your results only by: Marland Kitchen MyChart Message (if you have MyChart) OR . A paper copy in the mail If you have any lab test that is abnormal or we need to change your treatment, we will call you to review the results.  Testing/Procedures: None ordered  Follow-Up: At [facility], you and your health needs are our priority.  As part of our continuing mission to provide you with exceptional heart care, we have created designated Provider Care Teams.  These Care Teams include your primary Cardiologist (physician) and Advanced Practice Providers (APPs -  Physician Assistants and Nurse Practitioners) who all work together to provide you with the care you need, when you need it. You will need a follow up appointment in 6 months.  Please call our office 2 months in advance to schedule this appointment.  You may see  Dorathy Kinsman or another member of our [employer] Provider Team in [place]: Shirlee More, MD . Jyl Heinz, MD  Any Other Special Instructions Will Be Listed Below (If Applicable).

## 2018-04-03 NOTE — Progress Notes (Signed)
Cardiology Office Note:    Date:  04/03/2018   ID:  Allen Macdonald, DOB Dec 06, 1950, MRN [id number]  PCP:  Ronita Hipps, MD  Cardiologist:  Jenne Campus, MD    Referring MD: Ronita Hipps, MD   Chief Complaint  Patient presents with  . Follow-up  Doing great, asymptomatic.  History of Present Illness:    Allen Macdonald is a 67 y.o. male with history of cardiomyopathy latest estimation of left ventricle ejection fraction done in summer showed ejection fraction 50 to 55%.  He is doing well asymptomatic no chest pain tightness squeezing pressure been chest no shortness of breath.  He does not exercise on the regular basis but changing insurance which gave him Silver sneakers he wants to join [employer] which I strongly recommended.  Past Medical History:  Diagnosis Date  . Dilated cardiomyopathy ([place])   . History of pulmonary embolus (PE)     Past Surgical History:  Procedure Laterality Date  . PROSTATE SURGERY    . Venacava Filter      Current Medications: Current Meds  Medication Sig  . bicalutamide (CASODEX) 50 MG tablet Take 50 mg by mouth daily.  . carvedilol (COREG) 6.25 MG tablet Take 6.25 mg by mouth 2 (two) times daily.  . fluticasone (FLOVENT HFA) 110 MCG/ACT inhaler Inhale 1 puff into the lungs 2 (two) times daily.  Marland Kitchen lisinopril (PRINIVIL,ZESTRIL) 20 MG tablet TAKE ONE TABLET BY MOUTH EVERY DAY  . rivaroxaban (XARELTO) 20 MG TABS tablet Take 1 tablet (20 mg total) by mouth daily with supper.     Allergies:   Patient has no known allergies.   Social History   Socioeconomic History  . Marital status: Married    Spouse name: Not on file  . Number of children: Not on file  . Years of education: Not on file  . Highest education level: Not on file  Occupational History  . Not on file  Social Needs  . Financial resource strain: Not on file  . Food insecurity:    Worry: Not on file    Inability: Not on file  . Transportation needs:    Medical: Not on file     Non-medical: Not on file  Tobacco Use  . Smoking status: Never Smoker  . Smokeless tobacco: Never Used  Substance and Sexual Activity  . Alcohol use: Yes  . Drug use: No  . Sexual activity: Not on file  Lifestyle  . Physical activity:    Days per week: Not on file    Minutes per session: Not on file  . Stress: Not on file  Relationships  . Social connections:    Talks on phone: Not on file    Gets together: Not on file    Attends religious service: Not on file    Active member of club or organization: Not on file    Attends meetings of clubs or organizations: Not on file    Relationship status: Not on file  Other Topics Concern  . Not on file  Social History Narrative  . Not on file     Family History: The patient's family history includes Heart failure in his mother; Pleurisy in his mother. ROS:   Please see the history of present illness.    All 14 point review of systems negative except as described per history of present illness  EKGs/Labs/Other Studies Reviewed:      Recent Labs: 04/22/2017: BUN 18; Creatinine, Ser 1.15; Hemoglobin 13.5; Platelets 164; Potassium  5.2; Sodium 141  Recent Lipid Panel No results found for: CHOL, TRIG, HDL, CHOLHDL, VLDL, LDLCALC, LDLDIRECT  Physical Exam:    VS:  BP 118/64   Pulse 61   Ht 5\' 11"  (1.803 m)   Wt 244 lb 9.6 oz (110.9 kg)   SpO2 98%   BMI 34.11 kg/m     Wt Readings from Last 3 Encounters:  04/03/18 244 lb 9.6 oz (110.9 kg)  08/15/17 235 lb 12.8 oz (107 kg)  05/16/17 239 lb (108.4 kg)     GEN:  Well nourished, well developed in no acute distress HEENT: Normal NECK: No JVD; No carotid bruits LYMPHATICS: No lymphadenopathy CARDIAC: RRR, no murmurs, no rubs, no gallops RESPIRATORY:  Clear to auscultation without rales, wheezing or rhonchi  ABDOMEN: Soft, non-tender, non-distended MUSCULOSKELETAL:  No edema; No deformity  SKIN: Warm and dry LOWER EXTREMITIES: no swelling NEUROLOGIC:  Alert and oriented x  3 PSYCHIATRIC:  Normal affect   ASSESSMENT:    1. Dilated cardiomyopathy ([place])   2. Essential hypertension   3. History of DVT (deep vein thrombosis)   4. History of pulmonary embolism    PLAN:    In order of problems listed above:  1. History of cardiomyopathy ejection fraction 5055% on beta-blocker and ACE inhibitor will continue. 2. History of pulmonary emboli anticoagulated which I will continue no evidence of right ventricle enlargement or strength on echocardiogram. 3. Dyslipidemia wanting to have fasting lipid profile done.  However he is going to see his primary care physician next month and he preferred to have it done there.  Will wait for the results of it.   Medication Adjustments/Labs and Tests Ordered: Current medicines are reviewed at length with the patient today.  Concerns regarding medicines are outlined above.  No orders of the defined types were placed in this encounter.  Medication changes: No orders of the defined types were placed in this encounter.   Signed, Park Liter, MD, [facility] 04/03/2018 8:24 AM    [place]

## 2018-04-28 DIAGNOSIS — N3000 Acute cystitis without hematuria: Secondary | ICD-10-CM | POA: Diagnosis not present

## 2018-04-28 DIAGNOSIS — C61 Malignant neoplasm of prostate: Secondary | ICD-10-CM | POA: Diagnosis not present

## 2018-05-01 DIAGNOSIS — I10 Essential (primary) hypertension: Secondary | ICD-10-CM | POA: Diagnosis not present

## 2018-05-01 DIAGNOSIS — J4520 Mild intermittent asthma, uncomplicated: Secondary | ICD-10-CM | POA: Diagnosis not present

## 2018-05-01 DIAGNOSIS — Z6833 Body mass index (BMI) 33.0-33.9, adult: Secondary | ICD-10-CM | POA: Diagnosis not present

## 2018-09-13 ENCOUNTER — Other Ambulatory Visit: Payer: Self-pay | Admitting: Cardiology

## 2018-10-09 ENCOUNTER — Other Ambulatory Visit: Payer: Self-pay

## 2018-10-09 ENCOUNTER — Encounter: Payer: Self-pay | Admitting: Cardiology

## 2018-10-09 ENCOUNTER — Ambulatory Visit (INDEPENDENT_AMBULATORY_CARE_PROVIDER_SITE_OTHER): Payer: Medicare HMO | Admitting: Cardiology

## 2018-10-09 VITALS — BP 108/68 | HR 61 | Wt 234.2 lb

## 2018-10-09 DIAGNOSIS — Z86711 Personal history of pulmonary embolism: Secondary | ICD-10-CM

## 2018-10-09 DIAGNOSIS — Z86718 Personal history of other venous thrombosis and embolism: Secondary | ICD-10-CM | POA: Diagnosis not present

## 2018-10-09 DIAGNOSIS — I10 Essential (primary) hypertension: Secondary | ICD-10-CM

## 2018-10-09 DIAGNOSIS — I420 Dilated cardiomyopathy: Secondary | ICD-10-CM

## 2018-10-09 NOTE — Progress Notes (Signed)
Cardiology Office Note:    Date:  10/09/2018   ID:  Pj Zehner, DOB 02/26/1951, MRN [id number]  PCP:  Allen Hipps, MD  Cardiologist:  Jenne Campus, MD    Referring MD: Allen Hipps, MD   Chief Complaint  Patient presents with  . Follow-up  Doing well  History of Present Illness:    Allen Macdonald is a 68 y.o. male with history of cardiomyopathy, history of PE as well as DVT comes to my office for follow-up overall doing well denies have any chest pain tightness squeezing pressure burning chest.  He is frustrated with coronavirus situation he started going to [facility] and started really liking it but now because of coronavirus he cannot do it he tried to keep up with exercises just walking around and being active but still missing regular structured exercise program.  He likes to travel however his wife developing arthritis and started having difficulty moving around.  That frustrates him as well.  Denies have any chest pain tightness squeezing pressure burning chest.  Past Medical History:  Diagnosis Date  . Dilated cardiomyopathy ([place])   . History of pulmonary embolus (PE)     Past Surgical History:  Procedure Laterality Date  . PROSTATE SURGERY    . Venacava Filter      Current Medications: Current Meds  Medication Sig  . bicalutamide (CASODEX) 50 MG tablet Take 50 mg by mouth daily.  . carvedilol (COREG) 6.25 MG tablet Take 6.25 mg by mouth 2 (two) times daily.  . fluticasone (FLOVENT HFA) 110 MCG/ACT inhaler Inhale 1 puff into the lungs 2 (two) times daily.  Marland Kitchen lisinopril (PRINIVIL,ZESTRIL) 20 MG tablet TAKE ONE TABLET BY MOUTH EVERY DAY  . XARELTO 20 MG TABS tablet TAKE ONE TABLET BY MOUTH ONCE DAILY WITHSUPPER     Allergies:   Patient has no known allergies.   Social History   Socioeconomic History  . Marital status: Married    Spouse name: Not on file  . Number of children: Not on file  . Years of education: Not on file  . Highest education level:  Not on file  Occupational History  . Not on file  Social Needs  . Financial resource strain: Not on file  . Food insecurity    Worry: Not on file    Inability: Not on file  . Transportation needs    Medical: Not on file    Non-medical: Not on file  Tobacco Use  . Smoking status: Never Smoker  . Smokeless tobacco: Never Used  Substance and Sexual Activity  . Alcohol use: Yes  . Drug use: No  . Sexual activity: Not on file  Lifestyle  . Physical activity    Days per week: Not on file    Minutes per session: Not on file  . Stress: Not on file  Relationships  . Social Herbalist on phone: Not on file    Gets together: Not on file    Attends religious service: Not on file    Active member of club or organization: Not on file    Attends meetings of clubs or organizations: Not on file    Relationship status: Not on file  Other Topics Concern  . Not on file  Social History Narrative  . Not on file     Family History: The patient's family history includes Heart failure in his mother; Pleurisy in his mother. ROS:   Please see the history of present illness.  All 14 point review of systems negative except as described per history of present illness  EKGs/Labs/Other Studies Reviewed:      Recent Labs: No results found for requested labs within last 8760 hours.  Recent Lipid Panel No results found for: CHOL, TRIG, HDL, CHOLHDL, VLDL, LDLCALC, LDLDIRECT  Physical Exam:    VS:  BP 108/68   Pulse 61   Wt 234 lb 3.2 oz (106.2 kg)   SpO2 98%   BMI 32.66 kg/m     Wt Readings from Last 3 Encounters:  10/09/18 234 lb 3.2 oz (106.2 kg)  04/03/18 244 lb 9.6 oz (110.9 kg)  08/15/17 235 lb 12.8 oz (107 kg)     GEN:  Well nourished, well developed in no acute distress HEENT: Normal NECK: No JVD; No carotid bruits LYMPHATICS: No lymphadenopathy CARDIAC: RRR, no murmurs, no rubs, no gallops RESPIRATORY:  Clear to auscultation without rales, wheezing or rhonchi   ABDOMEN: Soft, non-tender, non-distended MUSCULOSKELETAL:  No edema; No deformity  SKIN: Warm and dry LOWER EXTREMITIES: no swelling NEUROLOGIC:  Alert and oriented x 3 PSYCHIATRIC:  Normal affect   ASSESSMENT:    1. Dilated cardiomyopathy ([place])   2. Essential hypertension   3. History of DVT (deep vein thrombosis)   4. History of pulmonary embolism    PLAN:    In order of problems listed above:  1. Cardiomyopathy he is on ACE inhibitor as well as beta-blocker I will repeat echocardiogram to check left ventricular ejection fraction.  If his ejection fraction is stable we will continue present management if deteriorates will need to switch him to [facility].  Overall clinically he is doing well physically he is euvolemic. 2. Essential hypertension blood pressure well controlled continue present management. 3. History of DVT and PE anticoagulated which I will continue.  No new problems.  Last echocardiogram showed normal pulmonary pressure.   Medication Adjustments/Labs and Tests Ordered: Current medicines are reviewed at length with the patient today.  Concerns regarding medicines are outlined above.  Orders Placed This Encounter  Procedures  . ECHOCARDIOGRAM COMPLETE   Medication changes: No orders of the defined types were placed in this encounter.   Signed, Park Liter, MD, [facility] 10/09/2018 9:00 AM    [place]

## 2018-10-09 NOTE — Patient Instructions (Signed)
Medication Instructions:  Your physician recommends that you continue on your current medications as directed. Please refer to the Current Medication list given to you today.  If you need a refill on your cardiac medications before your next appointment, please call your pharmacy.   Lab work: None.  If you have labs (blood work) drawn today and your tests are completely normal, you will receive your results only by: . MyChart Message (if you have MyChart) OR . A paper copy in the mail If you have any lab test that is abnormal or we need to change your treatment, we will call you to review the results.  Testing/Procedures: Your physician has requested that you have an echocardiogram. Echocardiography is a painless test that uses sound waves to create images of your heart. It provides your doctor with information about the size and shape of your heart and how well your heart's chambers and valves are working. This procedure takes approximately one hour. There are no restrictions for this procedure.    Follow-Up: At [facility], you and your health needs are our priority.  As part of our continuing mission to provide you with exceptional heart care, we have created designated Provider Care Teams.  These Care Teams include your primary Cardiologist (physician) and Advanced Practice Providers (APPs -  Physician Assistants and Nurse Practitioners) who all work together to provide you with the care you need, when you need it. You will need a follow up appointment in 6 months.  Please call our office 2 months in advance to schedule this appointment.  You may see No primary care provider on file. or another member of our [facility] Provider Team in [place]: Brian Munley, MD . Rajan Revankar, MD  Any Other Special Instructions Will Be Listed Below (If Applicable).   Echocardiogram An echocardiogram is a procedure that uses painless sound waves (ultrasound) to produce an image of the heart.  Images from an echocardiogram can provide important information about:  Signs of coronary artery disease (CAD).  Aneurysm detection. An aneurysm is a weak or damaged part of an artery wall that bulges out from the normal force of blood pumping through the body.  Heart size and shape. Changes in the size or shape of the heart can be associated with certain conditions, including heart failure, aneurysm, and CAD.  Heart muscle function.  Heart valve function.  Signs of a past heart attack.  Fluid buildup around the heart.  Thickening of the heart muscle.  A tumor or infectious growth around the heart valves. Tell a health care provider about:  Any allergies you have.  All medicines you are taking, including vitamins, herbs, eye drops, creams, and over-the-counter medicines.  Any blood disorders you have.  Any surgeries you have had.  Any medical conditions you have.  Whether you are pregnant or may be pregnant. What are the risks? Generally, this is a safe procedure. However, problems may occur, including:  Allergic reaction to dye (contrast) that may be used during the procedure. What happens before the procedure? No specific preparation is needed. You may eat and drink normally. What happens during the procedure?   An IV tube may be inserted into one of your veins.  You may receive contrast through this tube. A contrast is an injection that improves the quality of the pictures from your heart.  A gel will be applied to your chest.  A wand-like tool (transducer) will be moved over your chest. The gel will help to   transmit the sound waves from the transducer.  The sound waves will harmlessly bounce off of your heart to allow the heart images to be captured in real-time motion. The images will be recorded on a computer. The procedure may vary among health care providers and hospitals. What happens after the procedure?  You may return to your normal, everyday life,  including diet, activities, and medicines, unless your health care provider tells you not to do that. Summary  An echocardiogram is a procedure that uses painless sound waves (ultrasound) to produce an image of the heart.  Images from an echocardiogram can provide important information about the size and shape of your heart, heart muscle function, heart valve function, and fluid buildup around your heart.  You do not need to do anything to prepare before this procedure. You may eat and drink normally.  After the echocardiogram is completed, you may return to your normal, everyday life, unless your health care provider tells you not to do that. This information is not intended to replace advice given to you by your health care provider. Make sure you discuss any questions you have with your health care provider. Document Released: 04/02/2000 Document Revised: 05/08/2016 Document Reviewed: 05/08/2016 Elsevier Interactive Patient Education  2019 Elsevier Inc.    

## 2018-10-27 DIAGNOSIS — N3020 Other chronic cystitis without hematuria: Secondary | ICD-10-CM | POA: Diagnosis not present

## 2018-10-27 DIAGNOSIS — C61 Malignant neoplasm of prostate: Secondary | ICD-10-CM | POA: Diagnosis not present

## 2018-11-17 ENCOUNTER — Other Ambulatory Visit: Payer: Medicare HMO

## 2018-11-23 ENCOUNTER — Other Ambulatory Visit: Payer: Self-pay

## 2018-11-23 ENCOUNTER — Ambulatory Visit (INDEPENDENT_AMBULATORY_CARE_PROVIDER_SITE_OTHER): Payer: Medicare HMO

## 2018-11-23 DIAGNOSIS — I420 Dilated cardiomyopathy: Secondary | ICD-10-CM | POA: Diagnosis not present

## 2018-11-23 NOTE — Progress Notes (Signed)
Complete echocardiogram has been performed.  Jimmy Torrez [place] RDCS, RVT 

## 2018-11-28 ENCOUNTER — Telehealth: Payer: Self-pay | Admitting: Emergency Medicine

## 2018-11-28 DIAGNOSIS — I10 Essential (primary) hypertension: Secondary | ICD-10-CM

## 2018-11-28 NOTE — Telephone Encounter (Signed)
Patient informed of echo results and advised to have labs drawn to determine kidney function before Dr. Agustin Cree adjusts medication. Patient verbally understands. No further questions.

## 2018-11-30 DIAGNOSIS — I10 Essential (primary) hypertension: Secondary | ICD-10-CM | POA: Diagnosis not present

## 2018-12-01 LAB — BASIC METABOLIC PANEL
BUN/Creatinine Ratio: 14 (ref 10–24)
BUN: 14 mg/dL (ref 8–27)
CO2: 20 mmol/L (ref 20–29)
Calcium: 8.9 mg/dL (ref 8.6–10.2)
Chloride: 105 mmol/L (ref 96–106)
Creatinine, Ser: 0.99 mg/dL (ref 0.76–1.27)
GFR calc Af Amer: 89 mL/min/{1.73_m2} (ref >59)
GFR calc non Af Amer: 77 mL/min/{1.73_m2} (ref >59)
Glucose: 101 mg/dL — ABNORMAL HIGH (ref 65–99)
Potassium: 4.3 mmol/L (ref 3.5–5.2)
Sodium: 140 mmol/L (ref 134–144)

## 2019-01-29 DIAGNOSIS — Z23 Encounter for immunization: Secondary | ICD-10-CM | POA: Diagnosis not present

## 2019-03-13 ENCOUNTER — Other Ambulatory Visit: Payer: Self-pay | Admitting: Cardiology

## 2019-03-13 NOTE — Telephone Encounter (Signed)
Xarelto refill sent to [facility] [place]. Overdue for follow-up visit

## 2019-03-28 ENCOUNTER — Encounter: Payer: Self-pay | Admitting: Cardiology

## 2019-03-28 ENCOUNTER — Other Ambulatory Visit: Payer: Self-pay

## 2019-03-28 ENCOUNTER — Ambulatory Visit (INDEPENDENT_AMBULATORY_CARE_PROVIDER_SITE_OTHER): Payer: Medicare HMO | Admitting: Cardiology

## 2019-03-28 VITALS — BP 142/80 | HR 59 | Resp 16 | Ht 71.0 in | Wt 237.0 lb

## 2019-03-28 DIAGNOSIS — I10 Essential (primary) hypertension: Secondary | ICD-10-CM | POA: Diagnosis not present

## 2019-03-28 DIAGNOSIS — Z86711 Personal history of pulmonary embolism: Secondary | ICD-10-CM | POA: Diagnosis not present

## 2019-03-28 DIAGNOSIS — I420 Dilated cardiomyopathy: Secondary | ICD-10-CM

## 2019-03-28 MED ORDER — ENTRESTO 24-26 MG PO TABS: 1.0000 | ORAL_TABLET | Freq: Two times a day (BID) | ORAL | 2 refills | Status: DC

## 2019-03-28 NOTE — Patient Instructions (Signed)
Medication Instructions:  Your physician has recommended you make the following change in your medication:   STOP: Lisinopril   START: Entresto 24/26 mg twice daily (after stopping lisinopril for 48 hrs)   *If you need a refill on your cardiac medications before your next appointment, please call your pharmacy*  Lab Work: Your physician recommends that you return for lab work in 1 week: bmp   If you have labs (blood work) drawn today and your tests are completely normal, you will receive your results only by: Marland Kitchen MyChart Message (if you have MyChart) OR . A paper copy in the mail If you have any lab test that is abnormal or we need to change your treatment, we will call you to review the results.  Testing/Procedures: Your physician has requested that you have a lexiscan myoview. For further information please visit [email or web address]. Please follow instruction sheet, as given.    Follow-Up: At [facility], you and your health needs are our priority.  As part of our continuing mission to provide you with exceptional heart care, we have created designated Provider Care Teams.  These Care Teams include your primary Cardiologist (physician) and Advanced Practice Providers (APPs -  Physician Assistants and Nurse Practitioners) who all work together to provide you with the care you need, when you need it.  Your next appointment:   1 month(s)  The format for your next appointment:   In Person  Provider:   Jenne Campus, MD  Other Instructions   Cardiac Nuclear Scan A cardiac nuclear scan is a test that measures blood flow to the heart when a person is resting and when he or she is exercising. The test looks for problems such as:  Not enough blood reaching a portion of the heart.  The heart muscle not working normally. You may need this test if:  You have heart disease.  You have had abnormal lab results.  You have had heart surgery or a balloon procedure to open up  blocked arteries (angioplasty).  You have chest pain.  You have shortness of breath. In this test, a radioactive dye (tracer) is injected into your bloodstream. After the tracer has traveled to your heart, an imaging device is used to measure how much of the tracer is absorbed by or distributed to various areas of your heart. This procedure is usually done at a hospital and takes 2-4 hours. Tell a health care provider about:  Any allergies you have.  All medicines you are taking, including vitamins, herbs, eye drops, creams, and over-the-counter medicines.  Any problems you or family members have had with anesthetic medicines.  Any blood disorders you have.  Any surgeries you have had.  Any medical conditions you have.  Whether you are pregnant or may be pregnant. What are the risks? Generally, this is a safe procedure. However, problems may occur, including:  Serious chest pain and heart attack. This is only a risk if the stress portion of the test is done.  Rapid heartbeat.  Sensation of warmth in your chest. This usually passes quickly.  Allergic reaction to the tracer. What happens before the procedure?  Ask your health care provider about changing or stopping your regular medicines. This is especially important if you are taking diabetes medicines or blood thinners.  Follow instructions from your health care provider about eating or drinking restrictions.  Remove your jewelry on the day of the procedure. What happens during the procedure?  An IV will be inserted into one  of your veins.  Your health care provider will inject a small amount of radioactive tracer through the IV.  You will wait for 20-40 minutes while the tracer travels through your bloodstream.  Your heart activity will be monitored with an electrocardiogram (ECG).  You will lie down on an exam table.  Images of your heart will be taken for about 15-20 minutes.  You may also have a stress test.  For this test, one of the following may be done: ? You will exercise on a treadmill or stationary bike. While you exercise, your heart's activity will be monitored with an ECG, and your blood pressure will be checked. ? You will be given medicines that will increase blood flow to parts of your heart. This is done if you are unable to exercise.  When blood flow to your heart has peaked, a tracer will again be injected through the IV.  After 20-40 minutes, you will get back on the exam table and have more images taken of your heart.  Depending on the type of tracer used, scans may need to be repeated 3-4 hours later.  Your IV line will be removed when the procedure is over. The procedure may vary among health care providers and hospitals. What happens after the procedure?  Unless your health care provider tells you otherwise, you may return to your normal schedule, including diet, activities, and medicines.  Unless your health care provider tells you otherwise, you may increase your fluid intake. This will help to flush the contrast dye from your body. Drink enough fluid to keep your urine pale yellow.  Ask your health care provider, or the department that is doing the test: ? When will my results be ready? ? How will I get my results? Summary  A cardiac nuclear scan measures the blood flow to the heart when a person is resting and when he or she is exercising.  Tell your health care provider if you are pregnant.  Before the procedure, ask your health care provider about changing or stopping your regular medicines. This is especially important if you are taking diabetes medicines or blood thinners.  After the procedure, unless your health care provider tells you otherwise, increase your fluid intake. This will help flush the contrast dye from your body.  After the procedure, unless your health care provider tells you otherwise, you may return to your normal schedule, including diet,  activities, and medicines. This information is not intended to replace advice given to you by your health care provider. Make sure you discuss any questions you have with your health care provider. Document Released: 04/30/2004 Document Revised: 09/19/2017 Document Reviewed: 09/19/2017 Elsevier Patient Education  [place]; Valsartan oral tablet What is this medicine? SACUBITRIL; VALSARTAN (sak UE bi tril; val SAR tan) is a combination of 2 drugs used to reduce the risk of death and hospitalizations in people with long-lasting heart failure. It is usually used with other medicines to treat heart failure. This medicine may be used for other purposes; ask your health care provider or pharmacist if you have questions. COMMON BRAND NAME(S): Entresto What should I tell my health care provider before I take this medicine? They need to know if you have any of these conditions:  diabetes and take a medicine that contains aliskiren  kidney disease  liver disease  an unusual or allergic reaction to sacubitril; valsartan, drugs called angiotensin converting enzyme (ACE) inhibitors, angiotensin II receptor blockers (ARBs), other medicines, foods,  dyes, or preservatives  pregnant or trying to get pregnant  breast-feeding How should I use this medicine? Take this medicine by mouth with a glass of water. Follow the directions on the prescription label. You can take it with or without food. If it upsets your stomach, take it with food. Take your medicine at regular intervals. Do not take it more often than directed. Do not stop taking except on your doctor's advice. Do not take this medicine for at least 36 hours before or after you take an ACE inhibitor medicine. Talk to your health care provider if you are not sure if you take an ACE inhibitor. Talk to your pediatrician regarding the use of this medicine in children. Special care may be needed. Overdosage: If you think you have  taken too much of this medicine contact a poison control center or emergency room at once. NOTE: This medicine is only for you. Do not share this medicine with others. What if I miss a dose? If you miss a dose, take it as soon as you can. If it is almost time for next dose, take only that dose. Do not take double or extra doses. What may interact with this medicine? Do not take this medicine with any of the following medicines:  aliskiren if you have diabetes  angiotensin-converting enzyme (ACE) inhibitors, like benazepril, captopril, enalapril, fosinopril, lisinopril, or ramipril This medicine may also interact with the following medicines:  angiotensin II receptor blockers (ARBs) like azilsartan, candesartan, eprosartan, irbesartan, losartan, olmesartan, telmisartan, or valsartan  lithium  NSAIDS, medicines for pain and inflammation, like ibuprofen or naproxen  potassium-sparing diuretics like amiloride, spironolactone, and triamterene  potassium supplements This list may not describe all possible interactions. Give your health care provider a list of all the medicines, herbs, non-prescription drugs, or dietary supplements you use. Also tell them if you smoke, drink alcohol, or use illegal drugs. Some items may interact with your medicine. What should I watch for while using this medicine? Tell your doctor or healthcare professional if your symptoms do not start to get better or if they get worse. Do not become pregnant while taking this medicine. Women should inform their doctor if they wish to become pregnant or think they might be pregnant. There is a potential for serious side effects to an unborn child. Talk to your health care professional or pharmacist for more information. You may get dizzy. Do not drive, use machinery, or do anything that needs mental alertness until you know how this medicine affects you. Do not stand or sit up quickly, especially if you are an older patient.  This reduces the risk of dizzy or fainting spells. Avoid alcoholic drinks; they can make you more dizzy. What side effects may I notice from receiving this medicine? Side effects that you should report to your doctor or health care professional as soon as possible:  allergic reactions like skin rash, itching or hives, swelling of the face, lips, or tongue  signs and symptoms of increased potassium like muscle weakness; chest pain; or fast, irregular heartbeat  signs and symptoms of kidney injury like trouble passing urine or change in the amount of urine  signs and symptoms of low blood pressure like feeling dizzy or lightheaded, or if you develop extreme fatigue Side effects that usually do not require medical attention (report to your doctor or health care professional if they continue or are bothersome):  cough This list may not describe all possible side effects. Call your doctor  for medical advice about side effects. You may report side effects to FDA at 1-800-FDA-1088. Where should I keep my medicine? Keep out of the reach of children. Store at room temperature between 15 and 30 degrees C (59 and 86 degrees F). Throw away any unused medicine after the expiration date. NOTE: This sheet is a summary. It may not cover all possible information. If you have questions about this medicine, talk to your doctor, pharmacist, or health care provider.  2020 Elsevier/Gold Standard (2015-05-21 13:54:19)

## 2019-03-28 NOTE — Progress Notes (Signed)
Cardiology Office Note:    Date:  03/28/2019   ID:  Allen Macdonald, DOB Jul 16, 1950, MRN [id number]  PCP:  Allen Hipps, MD  Cardiologist:  Allen Campus, MD    Referring MD: Allen Hipps, MD   Chief Complaint  Patient presents with  . Follow-up  Doing well  History of Present Illness:    Allen Macdonald is a 68 y.o. male with history of cardiomyopathy with almost normalization however latest echocardiogram showed deterioration of left ventricle ejection fraction now ejection fraction is 35%.  Overall he is doing well.  Denies have any chest pain, tightness, pressure, burning in the chest still fatigued tired and short of breath but otherwise seems to be doing well.  No dizziness no passing out.  Past Medical History:  Diagnosis Date  . Dilated cardiomyopathy ([place])   . History of pulmonary embolus (PE)     Past Surgical History:  Procedure Laterality Date  . PROSTATE SURGERY    . Venacava Filter      Current Medications: Current Meds  Medication Sig  . bicalutamide (CASODEX) 50 MG tablet Take 50 mg by mouth daily.  . carvedilol (COREG) 6.25 MG tablet Take 6.25 mg by mouth 2 (two) times daily.  . fluticasone (FLOVENT HFA) 110 MCG/ACT inhaler Inhale 1 puff into the lungs 2 (two) times daily.  Marland Kitchen lisinopril (PRINIVIL,ZESTRIL) 20 MG tablet TAKE ONE TABLET BY MOUTH EVERY DAY  . XARELTO 20 MG TABS tablet TAKE ONE TABLET BY MOUTH EVERY DAY WITH SUPPER     Allergies:   Patient has no known allergies.   Social History   Socioeconomic History  . Marital status: Married    Spouse name: Not on file  . Number of children: Not on file  . Years of education: Not on file  . Highest education level: Not on file  Occupational History  . Not on file  Social Needs  . Financial resource strain: Not on file  . Food insecurity    Worry: Not on file    Inability: Not on file  . Transportation needs    Medical: Not on file    Non-medical: Not on file  Tobacco Use  . Smoking  status: Never Smoker  . Smokeless tobacco: Never Used  Substance and Sexual Activity  . Alcohol use: Yes    Alcohol/week: 14.0 standard drinks    Types: 10 Cans of beer, 1 Shots of liquor, 3 Glasses of wine per week  . Drug use: No  . Sexual activity: Not on file  Lifestyle  . Physical activity    Days per week: Not on file    Minutes per session: Not on file  . Stress: Not on file  Relationships  . Social Herbalist on phone: Not on file    Gets together: Not on file    Attends religious service: Not on file    Active member of club or organization: Not on file    Attends meetings of clubs or organizations: Not on file    Relationship status: Not on file  Other Topics Concern  . Not on file  Social History Narrative  . Not on file     Family History: The patient's family history includes Heart failure in his mother; Pleurisy in his mother. ROS:   Please see the history of present illness.    All 14 point review of systems negative except as described per history of present illness  EKGs/Labs/Other Studies Reviewed:  Recent Labs: 11/30/2018: BUN 14; Creatinine, Ser 0.99; Potassium 4.3; Sodium 140  Recent Lipid Panel No results found for: CHOL, TRIG, HDL, CHOLHDL, VLDL, LDLCALC, LDLDIRECT  Physical Exam:    VS:  BP (!) 142/80 (BP Location: Left Arm, Patient Position: Sitting)   Pulse (!) 59   Resp 16   Ht 5\' 11"  (1.803 m)   Wt 237 lb (107.5 kg)   SpO2 98%   BMI 33.05 kg/m     Wt Readings from Last 3 Encounters:  03/28/19 237 lb (107.5 kg)  10/09/18 234 lb 3.2 oz (106.2 kg)  04/03/18 244 lb 9.6 oz (110.9 kg)     GEN:  Well nourished, well developed in no acute distress HEENT: Normal NECK: No JVD; No carotid bruits LYMPHATICS: No lymphadenopathy CARDIAC: RRR, no murmurs, no rubs, no gallops RESPIRATORY:  Clear to auscultation without rales, wheezing or rhonchi  ABDOMEN: Soft, non-tender, non-distended MUSCULOSKELETAL:  No edema; No deformity   SKIN: Warm and dry LOWER EXTREMITIES: no swelling NEUROLOGIC:  Alert and oriented x 3 PSYCHIATRIC:  Normal affect   ASSESSMENT:    1. Dilated cardiomyopathy ([place])   2. History of pulmonary embolism   3. Essential hypertension    PLAN:    In order of problems listed above:  1. Cardiomyopathy with deterioration.  I will stop his lisinopril 48 hours then I start him on Entresto 2426 with intention to quickly ramp up his medical therapy watching for few months and then recheck left ventricle ejection fraction.  In the meantime we will also try to find out etiology of this phenomenon.  He will be scheduled to have stress test.  We will do Lexiscan.  Next week will come to our office to have Chem-7 done and I see him back in my office in the next month to ramp up his medical therapy. 2. History of pulmonary emboli still taking Xarelto which I will continue. 3. Essential hypertension mildly elevated today but hopefully will be better with addition and changes in his medications.  Sadly I cannot go up with his beta-blocker because he is already bradycardic.   Medication Adjustments/Labs and Tests Ordered: Current medicines are reviewed at length with the patient today.  Concerns regarding medicines are outlined above.  No orders of the defined types were placed in this encounter.  Medication changes: No orders of the defined types were placed in this encounter.   Signed, Allen Liter, MD, [facility] 03/28/2019 11:39 AM    [place]

## 2019-04-03 ENCOUNTER — Telehealth (HOSPITAL_COMMUNITY): Payer: Self-pay | Admitting: *Deleted

## 2019-04-03 NOTE — Telephone Encounter (Signed)
Patient given detailed instructions per Myocardial Perfusion Study Information Sheet for the test on 04/04/19 Patient notified to arrive 15 minutes early and that it is imperative to arrive on time for appointment to keep from having the test rescheduled.  If you need to cancel or reschedule your appointment, please call the office within 24 hours of your appointment. . Patient verbalized understanding. Kirstie Peri

## 2019-04-04 ENCOUNTER — Ambulatory Visit (INDEPENDENT_AMBULATORY_CARE_PROVIDER_SITE_OTHER): Payer: Medicare HMO

## 2019-04-04 ENCOUNTER — Other Ambulatory Visit: Payer: Self-pay

## 2019-04-04 VITALS — Ht 70.0 in | Wt 237.0 lb

## 2019-04-04 DIAGNOSIS — I420 Dilated cardiomyopathy: Secondary | ICD-10-CM

## 2019-04-04 DIAGNOSIS — Z86718 Personal history of other venous thrombosis and embolism: Secondary | ICD-10-CM | POA: Diagnosis not present

## 2019-04-04 LAB — MYOCARDIAL PERFUSION IMAGING
LV dias vol: 110 mL (ref 62–150)
LV sys vol: 62 mL
Peak HR: 77 {beats}/min
Rest HR: 57 {beats}/min
SDS: 0
SRS: 6
SSS: 6
TID: 1.1

## 2019-04-04 MED ORDER — TECHNETIUM TC 99M TETROFOSMIN IV KIT
31.6000 | PACK | Freq: Once | INTRAVENOUS | Status: AC | PRN
  Administered 2019-04-04: 31.6 via INTRAVENOUS

## 2019-04-04 MED ORDER — TECHNETIUM TC 99M TETROFOSMIN IV KIT
10.5000 | PACK | Freq: Once | INTRAVENOUS | Status: AC | PRN
  Administered 2019-04-04: 10.5 via INTRAVENOUS

## 2019-04-04 MED ORDER — REGADENOSON 0.4 MG/5ML IV SOLN
0.4000 mg | Freq: Once | INTRAVENOUS | Status: AC
  Administered 2019-04-04: 0.4 mg via INTRAVENOUS

## 2019-04-09 DIAGNOSIS — I10 Essential (primary) hypertension: Secondary | ICD-10-CM | POA: Diagnosis not present

## 2019-04-10 LAB — BASIC METABOLIC PANEL
BUN/Creatinine Ratio: 16 (ref 10–24)
BUN: 16 mg/dL (ref 8–27)
CO2: 20 mmol/L (ref 20–29)
Calcium: 9.3 mg/dL (ref 8.6–10.2)
Chloride: 104 mmol/L (ref 96–106)
Creatinine, Ser: 0.98 mg/dL (ref 0.76–1.27)
GFR calc Af Amer: 90 mL/min/{1.73_m2} (ref >59)
GFR calc non Af Amer: 78 mL/min/{1.73_m2} (ref >59)
Glucose: 91 mg/dL (ref 65–99)
Potassium: 4.5 mmol/L (ref 3.5–5.2)
Sodium: 141 mmol/L (ref 134–144)

## 2019-04-11 ENCOUNTER — Other Ambulatory Visit: Payer: Self-pay | Admitting: Cardiology

## 2019-05-01 ENCOUNTER — Encounter: Payer: Self-pay | Admitting: Cardiology

## 2019-05-01 ENCOUNTER — Other Ambulatory Visit: Payer: Self-pay

## 2019-05-01 ENCOUNTER — Ambulatory Visit (INDEPENDENT_AMBULATORY_CARE_PROVIDER_SITE_OTHER): Payer: Medicare Other | Admitting: Cardiology

## 2019-05-01 VITALS — BP 132/72 | HR 62 | Ht 70.0 in | Wt 238.0 lb

## 2019-05-01 DIAGNOSIS — I10 Essential (primary) hypertension: Secondary | ICD-10-CM

## 2019-05-01 DIAGNOSIS — Z86711 Personal history of pulmonary embolism: Secondary | ICD-10-CM

## 2019-05-01 DIAGNOSIS — I420 Dilated cardiomyopathy: Secondary | ICD-10-CM | POA: Diagnosis not present

## 2019-05-01 DIAGNOSIS — R9439 Abnormal result of other cardiovascular function study: Secondary | ICD-10-CM

## 2019-05-01 HISTORY — DX: Abnormal result of other cardiovascular function study: R94.39

## 2019-05-01 MED ORDER — ENTRESTO 49-51 MG PO TABS: 1.0000 | ORAL_TABLET | Freq: Two times a day (BID) | ORAL | 2 refills | Status: DC

## 2019-05-01 NOTE — Patient Instructions (Signed)
Medication Instructions:  Your physician has recommended you make the following change in your medication:  INCREASE: Entresto to 49/51 mg twice daily   *If you need a refill on your cardiac medications before your next appointment, please call your pharmacy*  Lab Work: Your physician recommends that you return for lab work on Friday or Monday: BMP   If you have labs (blood work) drawn today and your tests are completely normal, you will receive your results only by: Marland Kitchen MyChart Message (if you have MyChart) OR . A paper copy in the mail If you have any lab test that is abnormal or we need to change your treatment, we will call you to review the results.  Testing/Procedures: None.   Follow-Up: At [facility], you and your health needs are our priority.  As part of our continuing mission to provide you with exceptional heart care, we have created designated Provider Care Teams.  These Care Teams include your primary Cardiologist (physician) and Advanced Practice Providers (APPs -  Physician Assistants and Nurse Practitioners) who all work together to provide you with the care you need, when you need it.  Your next appointment:   3 week(s)  The format for your next appointment:   In Person  Provider:   Jenne Campus, MD  Other Instructions

## 2019-05-01 NOTE — Progress Notes (Signed)
Cardiology Office Note:    Date:  05/01/2019   ID:  Melissa Clemente, DOB 02-04-1951, MRN [id number]  PCP:  Ronita Hipps, MD  Cardiologist:  Jenne Campus, MD    Referring MD: Ronita Hipps, MD   Chief Complaint  Patient presents with  . Follow-up  Doing well  History of Present Illness:    Allen Macdonald is a 69 y.o. male with history of cardiomyopathy initial ejection fraction severely diminished and normalization now we have again dropping his ejection fraction to 30 to 35%.  Overall he is doing very well.  Denies have any chest pain, tightness, pressure, burning in the chest.  He is basically asymptomatic.  Gradually trying to put him on right medications.  He is on Entresto tolerating well will double the dose today Chem-7 will be checked next week.  He did have a stress test which showed minimal area of ischemia which is obviously concerned he is completely asymptomatic I will maximize his medical therapy, echocardiogram will be done if there is no improvement will proceed with cardiac catheterization  Past Medical History:  Diagnosis Date  . Dilated cardiomyopathy ([place])   . History of pulmonary embolus (PE)     Past Surgical History:  Procedure Laterality Date  . PROSTATE SURGERY    . Venacava Filter      Current Medications: Current Meds  Medication Sig  . bicalutamide (CASODEX) 50 MG tablet Take 50 mg by mouth daily.  . carvedilol (COREG) 6.25 MG tablet Take 6.25 mg by mouth 2 (two) times daily.  . fluticasone (FLOVENT HFA) 110 MCG/ACT inhaler Inhale 1 puff into the lungs 2 (two) times daily.  . sacubitril-valsartan (ENTRESTO) 24-26 MG Take 1 tablet by mouth 2 (two) times daily.  Alveda Reasons 20 MG TABS tablet TAKE ONE TABLET BY MOUTH EVERY DAY WITH SUPPER.     Allergies:   Patient has no known allergies.   Social History   Socioeconomic History  . Marital status: Married    Spouse name: Not on file  . Number of children: Not on file  . Years of education:  Not on file  . Highest education level: Not on file  Occupational History  . Not on file  Tobacco Use  . Smoking status: Never Smoker  . Smokeless tobacco: Never Used  Substance and Sexual Activity  . Alcohol use: Yes    Alcohol/week: 14.0 standard drinks    Types: 10 Cans of beer, 1 Shots of liquor, 3 Glasses of wine per week  . Drug use: No  . Sexual activity: Not on file  Other Topics Concern  . Not on file  Social History Narrative  . Not on file   Social Determinants of Health   Financial Resource Strain:   . Difficulty of Paying Living Expenses: Not on file  Food Insecurity:   . Worried About Charity fundraiser in the Last Year: Not on file  . Ran Out of Food in the Last Year: Not on file  Transportation Needs:   . Lack of Transportation (Medical): Not on file  . Lack of Transportation (Non-Medical): Not on file  Physical Activity:   . Days of Exercise per Week: Not on file  . Minutes of Exercise per Session: Not on file  Stress:   . Feeling of Stress : Not on file  Social Connections:   . Frequency of Communication with Friends and Family: Not on file  . Frequency of Social Gatherings with Friends and Family:  Not on file  . Attends Religious Services: Not on file  . Active Member of Clubs or Organizations: Not on file  . Attends Archivist Meetings: Not on file  . Marital Status: Not on file     Family History: The patient's family history includes Heart failure in his mother; Pleurisy in his mother. ROS:   Please see the history of present illness.    All 14 point review of systems negative except as described per history of present illness  EKGs/Labs/Other Studies Reviewed:      Recent Labs: 04/09/2019: BUN 16; Creatinine, Ser 0.98; Potassium 4.5; Sodium 141  Recent Lipid Panel No results found for: CHOL, TRIG, HDL, CHOLHDL, VLDL, LDLCALC, LDLDIRECT  Physical Exam:    VS:  BP 132/72   Pulse 62   Ht 5\' 10"  (1.778 m)   Wt 238 lb (108 kg)    SpO2 98%   BMI 34.15 kg/m     Wt Readings from Last 3 Encounters:  05/01/19 238 lb (108 kg)  04/04/19 237 lb (107.5 kg)  03/28/19 237 lb (107.5 kg)     GEN:  Well nourished, well developed in no acute distress HEENT: Normal NECK: No JVD; No carotid bruits LYMPHATICS: No lymphadenopathy CARDIAC: RRR, no murmurs, no rubs, no gallops RESPIRATORY:  Clear to auscultation without rales, wheezing or rhonchi  ABDOMEN: Soft, non-tender, non-distended MUSCULOSKELETAL:  No edema; No deformity  SKIN: Warm and dry LOWER EXTREMITIES: no swelling NEUROLOGIC:  Alert and oriented x 3 PSYCHIATRIC:  Normal affect   ASSESSMENT:    1. Dilated cardiomyopathy ([place])   2. History of pulmonary embolism   3. Essential hypertension   4. Abnormal stress test minimal ischemia involving apex    PLAN:    In order of problems listed above:  1. Dilated cardiomyopathy gradually try to go on appropriate medication we will increase Entresto today 2. History of pulmonary emboli continue with anticoagulation 3. Essential hypertension blood pressure well controlled continue present management. 4. Abnormal stress test.  Plan as outlined above we will try medical therapy first and then if no improvement ejection fraction will proceed with cardiac catheterization   Medication Adjustments/Labs and Tests Ordered: Current medicines are reviewed at length with the patient today.  Concerns regarding medicines are outlined above.  No orders of the defined types were placed in this encounter.  Medication changes: No orders of the defined types were placed in this encounter.   Signed, Park Liter, MD, [facility] 05/01/2019 10:31 AM    [place]

## 2019-05-01 NOTE — Addendum Note (Signed)
Addended by: Ashok Norris on: 05/01/2019 10:46 AM   Modules accepted: Orders

## 2019-05-07 DIAGNOSIS — I10 Essential (primary) hypertension: Secondary | ICD-10-CM | POA: Diagnosis not present

## 2019-05-07 LAB — BASIC METABOLIC PANEL
BUN/Creatinine Ratio: 18 (ref 10–24)
BUN: 21 mg/dL (ref 8–27)
CO2: 20 mmol/L (ref 20–29)
Calcium: 9.5 mg/dL (ref 8.6–10.2)
Chloride: 103 mmol/L (ref 96–106)
Creatinine, Ser: 1.15 mg/dL (ref 0.76–1.27)
GFR calc Af Amer: 74 mL/min/{1.73_m2} (ref >59)
GFR calc non Af Amer: 64 mL/min/{1.73_m2} (ref >59)
Glucose: 108 mg/dL — ABNORMAL HIGH (ref 65–99)
Potassium: 4.6 mmol/L (ref 3.5–5.2)
Sodium: 138 mmol/L (ref 134–144)

## 2019-05-23 ENCOUNTER — Ambulatory Visit (INDEPENDENT_AMBULATORY_CARE_PROVIDER_SITE_OTHER): Payer: Medicare Other | Admitting: Cardiology

## 2019-05-23 ENCOUNTER — Other Ambulatory Visit: Payer: Self-pay

## 2019-05-23 ENCOUNTER — Encounter: Payer: Self-pay | Admitting: Cardiology

## 2019-05-23 VITALS — BP 138/80 | HR 67 | Ht 70.0 in | Wt 237.0 lb

## 2019-05-23 DIAGNOSIS — R9439 Abnormal result of other cardiovascular function study: Secondary | ICD-10-CM | POA: Diagnosis not present

## 2019-05-23 DIAGNOSIS — Z86711 Personal history of pulmonary embolism: Secondary | ICD-10-CM | POA: Diagnosis not present

## 2019-05-23 DIAGNOSIS — I10 Essential (primary) hypertension: Secondary | ICD-10-CM | POA: Diagnosis not present

## 2019-05-23 DIAGNOSIS — I420 Dilated cardiomyopathy: Secondary | ICD-10-CM

## 2019-05-23 MED ORDER — CARVEDILOL 12.5 MG PO TABS: 12.5000 mg | ORAL_TABLET | Freq: Two times a day (BID) | ORAL | 2 refills | Status: DC

## 2019-05-23 NOTE — Progress Notes (Signed)
Cardiology Office Note:    Date:  05/23/2019   ID:  Allen Macdonald, DOB 1950/07/16, MRN [id number]  PCP:  Allen Hipps, MD  Cardiologist:  Allen Campus, MD    Referring MD: Allen Hipps, MD   Chief Complaint  Patient presents with  . Follow-up    3 WK FU     History of Present Illness:    Allen Macdonald is a 69 y.o. male with past medical history significant for cardiomyopathy, initial ejection fraction diminished and normalization and now again deterioration of left ventricle ejection fraction to 35%.  He did have a stress test done which showed minimal area of ischemia involving apex.  Favor medical therapy for now.  He is on decent dose of Entresto.  Today I will double the dose of carvedilol.  Interestingly he tells me that he got pain in the right calf and right Achilles.  This exactly the same sensation he got before he developed his DVT.  He is taking Xarelto on the regular basis, therefore chances for DVT is rather low however, I will ask him to have a duplex of lower extremities to rule out any DVT.  Past Medical History:  Diagnosis Date  . Dilated cardiomyopathy ([place])   . History of pulmonary embolus (PE)     Past Surgical History:  Procedure Laterality Date  . PROSTATE SURGERY    . Venacava Filter      Current Medications: Current Meds  Medication Sig  . bicalutamide (CASODEX) 50 MG tablet Take 50 mg by mouth daily.  . carvedilol (COREG) 6.25 MG tablet Take 6.25 mg by mouth 2 (two) times daily.  . fluticasone (FLOVENT HFA) 110 MCG/ACT inhaler Inhale 1 puff into the lungs 2 (two) times daily.  . sacubitril-valsartan (ENTRESTO) 49-51 MG Take 1 tablet by mouth 2 (two) times daily.  Alveda Reasons 20 MG TABS tablet TAKE ONE TABLET BY MOUTH EVERY DAY WITH SUPPER.     Allergies:   Patient has no known allergies.   Social History   Socioeconomic History  . Marital status: Married    Spouse name: Not on file  . Number of children: Not on file  . Years of  education: Not on file  . Highest education level: Not on file  Occupational History  . Not on file  Tobacco Use  . Smoking status: Never Smoker  . Smokeless tobacco: Never Used  Substance and Sexual Activity  . Alcohol use: Yes    Alcohol/week: 14.0 standard drinks    Types: 10 Cans of beer, 1 Shots of liquor, 3 Glasses of wine per week  . Drug use: No  . Sexual activity: Not on file  Other Topics Concern  . Not on file  Social History Narrative  . Not on file   Social Determinants of Health   Financial Resource Strain:   . Difficulty of Paying Living Expenses: Not on file  Food Insecurity:   . Worried About Charity fundraiser in the Last Year: Not on file  . Ran Out of Food in the Last Year: Not on file  Transportation Needs:   . Lack of Transportation (Medical): Not on file  . Lack of Transportation (Non-Medical): Not on file  Physical Activity:   . Days of Exercise per Week: Not on file  . Minutes of Exercise per Session: Not on file  Stress:   . Feeling of Stress : Not on file  Social Connections:   . Frequency of Communication with  Friends and Family: Not on file  . Frequency of Social Gatherings with Friends and Family: Not on file  . Attends Religious Services: Not on file  . Active Member of Clubs or Organizations: Not on file  . Attends Archivist Meetings: Not on file  . Marital Status: Not on file     Family History: The patient's family history includes Heart failure in his mother; Pleurisy in his mother. ROS:   Please see the history of present illness.    All 14 point review of systems negative except as described per history of present illness  EKGs/Labs/Other Studies Reviewed:      Recent Labs: 05/07/2019: BUN 21; Creatinine, Ser 1.15; Potassium 4.6; Sodium 138  Recent Lipid Panel No results found for: CHOL, TRIG, HDL, CHOLHDL, VLDL, LDLCALC, LDLDIRECT  Physical Exam:    VS:  BP 138/80   Pulse 67   Ht 5\' 10"  (1.778 m)   Wt 237  lb (107.5 kg)   SpO2 99%   BMI 34.01 kg/m     Wt Readings from Last 3 Encounters:  05/23/19 237 lb (107.5 kg)  05/01/19 238 lb (108 kg)  04/04/19 237 lb (107.5 kg)     GEN:  Well nourished, well developed in no acute distress HEENT: Normal NECK: No JVD; No carotid bruits LYMPHATICS: No lymphadenopathy CARDIAC: RRR, no murmurs, no rubs, no gallops RESPIRATORY:  Clear to auscultation without rales, wheezing or rhonchi  ABDOMEN: Soft, non-tender, non-distended MUSCULOSKELETAL:  No edema; No deformity  SKIN: Warm and dry LOWER EXTREMITIES: no swelling NEUROLOGIC:  Alert and oriented x 3 PSYCHIATRIC:  Normal affect   ASSESSMENT:    1. Dilated cardiomyopathy ([place])   2. Essential hypertension   3. History of pulmonary embolism   4. Abnormal stress test minimal ischemia involving apex    PLAN:    In order of problems listed above:  1. Dilated cardiomyopathy gradually try to put him on right medications.  Will double the dose of carvedilol today.  His EKG today showed sinus rhythm rate 67 right axis and no ST segment changes 2. Essential hypertension blood pressure well controlled continue present management. 3. History of pulmonary emboli continue anticoagulation 4. Abnormal stress test showing minimal area of ischemia.  Completely asymptomatic.  Plan is to watch his heart if it does not improve within a month or 2 then will proceed with cardiac catheterization   Medication Adjustments/Labs and Tests Ordered: Current medicines are reviewed at length with the patient today.  Concerns regarding medicines are outlined above.  No orders of the defined types were placed in this encounter.  Medication changes: No orders of the defined types were placed in this encounter.   Signed, Park Liter, MD, [facility] 05/23/2019 11:20 AM    [place]

## 2019-05-23 NOTE — Patient Instructions (Signed)
Medication Instructions:  Your physician has recommended you make the following change in your medication:  INCREASE: carvedilol to 12.5 mg twice daily   *If you need a refill on your cardiac medications before your next appointment, please call your pharmacy*  Lab Work: None.  If you have labs (blood work) drawn today and your tests are completely normal, you will receive your results only by: Marland Kitchen MyChart Message (if you have MyChart) OR . A paper copy in the mail If you have any lab test that is abnormal or we need to change your treatment, we will call you to review the results.  Testing/Procedures: None.   Follow-Up: At [facility], you and your health needs are our priority.  As part of our continuing mission to provide you with exceptional heart care, we have created designated Provider Care Teams.  These Care Teams include your primary Cardiologist (physician) and Advanced Practice Providers (APPs -  Physician Assistants and Nurse Practitioners) who all work together to provide you with the care you need, when you need it.  Your next appointment:   1 month(s)  The format for your next appointment:   In Person  Provider:   Jenne Campus, MD  Other Instructions

## 2019-06-21 ENCOUNTER — Ambulatory Visit: Payer: Medicare Other | Admitting: Cardiology

## 2019-06-25 ENCOUNTER — Ambulatory Visit: Payer: Medicare Other | Admitting: Cardiology

## 2019-06-25 ENCOUNTER — Encounter: Payer: Self-pay | Admitting: Cardiology

## 2019-06-25 ENCOUNTER — Other Ambulatory Visit: Payer: Self-pay

## 2019-06-25 VITALS — BP 104/68 | HR 60 | Ht 71.0 in | Wt 242.0 lb

## 2019-06-25 DIAGNOSIS — R9439 Abnormal result of other cardiovascular function study: Secondary | ICD-10-CM | POA: Diagnosis not present

## 2019-06-25 DIAGNOSIS — I420 Dilated cardiomyopathy: Secondary | ICD-10-CM

## 2019-06-25 DIAGNOSIS — I10 Essential (primary) hypertension: Secondary | ICD-10-CM | POA: Diagnosis not present

## 2019-06-25 DIAGNOSIS — Z86711 Personal history of pulmonary embolism: Secondary | ICD-10-CM | POA: Diagnosis not present

## 2019-06-25 LAB — BASIC METABOLIC PANEL
BUN/Creatinine Ratio: 21 (ref 10–24)
BUN: 21 mg/dL (ref 8–27)
CO2: 20 mmol/L (ref 20–29)
Calcium: 9.3 mg/dL (ref 8.6–10.2)
Chloride: 105 mmol/L (ref 96–106)
Creatinine, Ser: 1.02 mg/dL (ref 0.76–1.27)
GFR calc Af Amer: 86 mL/min/{1.73_m2} (ref >59)
GFR calc non Af Amer: 74 mL/min/{1.73_m2} (ref >59)
Glucose: 89 mg/dL (ref 65–99)
Potassium: 4.6 mmol/L (ref 3.5–5.2)
Sodium: 139 mmol/L (ref 134–144)

## 2019-06-25 NOTE — Progress Notes (Signed)
Bmp

## 2019-06-25 NOTE — Progress Notes (Signed)
Cardiology Office Note:    Date:  06/25/2019   ID:  Allen Macdonald, DOB [birth year], MRN [id number]  PCP:  Ronita Hipps, MD  Cardiologist:  Jenne Campus, MD    Referring MD: Ronita Hipps, MD   No chief complaint on file. Doing well  History of Present Illness:    Allen Macdonald is a 69 y.o. male with past medical history significant for cardiomyopathy with normalization, however recently repeated echocardiogram showed deterioration of left ventricle ejection fraction down to 35.  He did have a stress test done which showed minimal area of ischemia however he prefer medical therapy.  Gradually try to go off of the medication he is already on decent dose of Entresto, recently increased dose of carvedilol, now his heart rate is 60 and his blood pressure is 104/68.  I think there is some role will Aldactone to his medical regiment which I will try to do today.  We will check his Chem-7 if Chem-7 is fine then will continue on Bactrim  Past Medical History:  Diagnosis Date  . Dilated cardiomyopathy ([place])   . History of pulmonary embolus (PE)     Past Surgical History:  Procedure Laterality Date  . PROSTATE SURGERY    . Venacava Filter      Current Medications: Current Meds  Medication Sig  . bicalutamide (CASODEX) 50 MG tablet Take 50 mg by mouth daily.  . carvedilol (COREG) 12.5 MG tablet Take 1 tablet (12.5 mg total) by mouth 2 (two) times daily.  . fluticasone (FLOVENT HFA) 110 MCG/ACT inhaler Inhale 1 puff into the lungs 2 (two) times daily.  . sacubitril-valsartan (ENTRESTO) 49-51 MG Take 1 tablet by mouth 2 (two) times daily.  Alveda Reasons 20 MG TABS tablet TAKE ONE TABLET BY MOUTH EVERY DAY WITH SUPPER.     Allergies:   Patient has no known allergies.   Social History   Socioeconomic History  . Marital status: Married    Spouse name: Not on file  . Number of children: Not on file  . Years of education: Not on file  . Highest education level: Not on file    Occupational History  . Not on file  Tobacco Use  . Smoking status: Never Smoker  . Smokeless tobacco: Never Used  Substance and Sexual Activity  . Alcohol use: Yes    Alcohol/week: 14.0 standard drinks    Types: 10 Cans of beer, 1 Shots of liquor, 3 Glasses of wine per week  . Drug use: No  . Sexual activity: Not on file  Other Topics Concern  . Not on file  Social History Narrative  . Not on file   Social Determinants of Health   Financial Resource Strain:   . Difficulty of Paying Living Expenses: Not on file  Food Insecurity:   . Worried About Charity fundraiser in the Last Year: Not on file  . Ran Out of Food in the Last Year: Not on file  Transportation Needs:   . Lack of Transportation (Medical): Not on file  . Lack of Transportation (Non-Medical): Not on file  Physical Activity:   . Days of Exercise per Week: Not on file  . Minutes of Exercise per Session: Not on file  Stress:   . Feeling of Stress : Not on file  Social Connections:   . Frequency of Communication with Friends and Family: Not on file  . Frequency of Social Gatherings with Friends and Family: Not on file  .  Attends Religious Services: Not on file  . Active Member of Clubs or Organizations: Not on file  . Attends Archivist Meetings: Not on file  . Marital Status: Not on file     Family History: The patient's family history includes Heart failure in his mother; Pleurisy in his mother. ROS:   Please see the history of present illness.    All 14 point review of systems negative except as described per history of present illness  EKGs/Labs/Other Studies Reviewed:      Recent Labs: 05/07/2019: BUN 21; Creatinine, Ser 1.15; Potassium 4.6; Sodium 138  Recent Lipid Panel No results found for: CHOL, TRIG, HDL, CHOLHDL, VLDL, LDLCALC, LDLDIRECT  Physical Exam:    VS:  BP 104/68   Pulse 60   Ht 5\' 11"  (1.803 m)   Wt 242 lb (109.8 kg)   BMI 33.75 kg/m     Wt Readings from Last 3  Encounters:  06/25/19 242 lb (109.8 kg)  05/23/19 237 lb (107.5 kg)  05/01/19 238 lb (108 kg)     GEN:  Well nourished, well developed in no acute distress HEENT: Normal NECK: No JVD; No carotid bruits LYMPHATICS: No lymphadenopathy CARDIAC: RRR, no murmurs, no rubs, no gallops RESPIRATORY:  Clear to auscultation without rales, wheezing or rhonchi  ABDOMEN: Soft, non-tender, non-distended MUSCULOSKELETAL:  No edema; No deformity  SKIN: Warm and dry LOWER EXTREMITIES: no swelling NEUROLOGIC:  Alert and oriented x 3 PSYCHIATRIC:  Normal affect   ASSESSMENT:    1. Dilated cardiomyopathy ([place])   2. Essential hypertension   3. History of pulmonary embolism   4. Abnormal stress test minimal ischemia involving apex    PLAN:    In order of problems listed above:  1. Dilated cardiomyopathy: Stable asymptomatic plan as outlined above we will document if you prefer just maintain. 2. Essential hypertension blood pressure appears to be on the low side.  We will continue present management. 3. History of PE.  He is anticoagulated which I will continue. 4. Normal stress test, favor medical therapy.   Medication Adjustments/Labs and Tests Ordered: Current medicines are reviewed at length with the patient today.  Concerns regarding medicines are outlined above.  No orders of the defined types were placed in this encounter.  Medication changes: No orders of the defined types were placed in this encounter.   Signed, Park Liter, MD, [facility] 06/25/2019 9:05 AM    [place]

## 2019-06-25 NOTE — Patient Instructions (Signed)

## 2019-06-28 ENCOUNTER — Telehealth: Payer: Self-pay | Admitting: Emergency Medicine

## 2019-06-28 DIAGNOSIS — I10 Essential (primary) hypertension: Secondary | ICD-10-CM

## 2019-06-28 MED ORDER — SPIRONOLACTONE 25 MG PO TABS: 12.5000 mg | ORAL_TABLET | Freq: Every day | ORAL | 1 refills | Status: DC

## 2019-06-28 NOTE — Telephone Encounter (Signed)
Called patient. Informed him of lab results and Dr. Wendy Poet recommendation to start aldactone 12.5 mg daily and have labs redrawn in 1 week. He verbally understood. No further questions.

## 2019-07-05 DIAGNOSIS — I10 Essential (primary) hypertension: Secondary | ICD-10-CM | POA: Diagnosis not present

## 2019-07-05 LAB — BASIC METABOLIC PANEL
BUN/Creatinine Ratio: 11 (ref 10–24)
BUN: 11 mg/dL (ref 8–27)
CO2: 21 mmol/L (ref 20–29)
Calcium: 8.9 mg/dL (ref 8.6–10.2)
Chloride: 105 mmol/L (ref 96–106)
Creatinine, Ser: 0.97 mg/dL (ref 0.76–1.27)
GFR calc Af Amer: 91 mL/min/{1.73_m2} (ref >59)
GFR calc non Af Amer: 79 mL/min/{1.73_m2} (ref >59)
Glucose: 96 mg/dL (ref 65–99)
Potassium: 4.7 mmol/L (ref 3.5–5.2)
Sodium: 139 mmol/L (ref 134–144)

## 2019-08-06 DIAGNOSIS — I10 Essential (primary) hypertension: Secondary | ICD-10-CM | POA: Diagnosis not present

## 2019-08-06 DIAGNOSIS — Z0000 Encounter for general adult medical examination without abnormal findings: Secondary | ICD-10-CM | POA: Diagnosis not present

## 2019-08-08 ENCOUNTER — Other Ambulatory Visit: Payer: Self-pay | Admitting: Cardiology

## 2019-08-28 DIAGNOSIS — C61 Malignant neoplasm of prostate: Secondary | ICD-10-CM

## 2019-08-28 HISTORY — DX: Malignant neoplasm of prostate: C61

## 2019-09-10 DIAGNOSIS — Z13820 Encounter for screening for osteoporosis: Secondary | ICD-10-CM | POA: Diagnosis not present

## 2019-09-10 DIAGNOSIS — M810 Age-related osteoporosis without current pathological fracture: Secondary | ICD-10-CM | POA: Diagnosis not present

## 2019-09-18 ENCOUNTER — Other Ambulatory Visit: Payer: Self-pay

## 2019-09-18 ENCOUNTER — Ambulatory Visit: Payer: Medicare Other | Admitting: Cardiology

## 2019-09-18 ENCOUNTER — Encounter: Payer: Self-pay | Admitting: Cardiology

## 2019-09-18 VITALS — BP 132/68 | HR 59 | Ht 71.0 in | Wt 239.2 lb

## 2019-09-18 DIAGNOSIS — R05 Cough: Secondary | ICD-10-CM | POA: Diagnosis not present

## 2019-09-18 DIAGNOSIS — I420 Dilated cardiomyopathy: Secondary | ICD-10-CM

## 2019-09-18 DIAGNOSIS — Z86718 Personal history of other venous thrombosis and embolism: Secondary | ICD-10-CM

## 2019-09-18 DIAGNOSIS — I10 Essential (primary) hypertension: Secondary | ICD-10-CM

## 2019-09-18 DIAGNOSIS — Z86711 Personal history of pulmonary embolism: Secondary | ICD-10-CM | POA: Diagnosis not present

## 2019-09-18 DIAGNOSIS — R9439 Abnormal result of other cardiovascular function study: Secondary | ICD-10-CM | POA: Diagnosis not present

## 2019-09-18 NOTE — Progress Notes (Signed)
Cardiology Office Note:    Date:  09/18/2019   ID:  Allen Macdonald, DOB [birth year], MRN [id number]  PCP:  Ronita Hipps, MD  Cardiologist:  Jenne Campus, MD    Referring MD: Ronita Hipps, MD   Chief Complaint  Patient presents with  . Follow-up    3 MO FU   I am feeling well  History of Present Illness:    Allen Macdonald is a 69 y.o. male with past medical history significant for cardiomyopathy, then he improved left ventricle ejection fraction to normal, however last echocardiogram done in August of last year showed deterioration of left ventricle ejection fraction to about 35%.  His medication has been modified he has been put on [employer] as well as Aldactone.  He seems to be doing well.  Still busy working the garden.  As a part of evaluation he had stress test done which showed a small minimal area of ischemia involving apex.  We did talk about options for the situation including cardiac catheterization, however he prefers conservative approach.  Therefore the plan will be to repeat his echocardiogram based on that decide what will be the next step.  If his ejection fraction still diminished then cardiac catheterization will be warranted.  Luckily, clinically he is doing well.  He slowed down a little bit he said but still working the garden have no difficulty doing it.  Past Medical History:  Diagnosis Date  . Dilated cardiomyopathy ([place])   . History of pulmonary embolus (PE)     Past Surgical History:  Procedure Laterality Date  . PROSTATE SURGERY    . Venacava Filter      Current Medications: Current Meds  Medication Sig  . bicalutamide (CASODEX) 50 MG tablet Take 50 mg by mouth daily.  . carvedilol (COREG) 12.5 MG tablet Take 1 tablet (12.5 mg total) by mouth 2 (two) times daily.  Marland Kitchen ENTRESTO 49-51 MG TAKE 1 TABLET BY MOUTH TWICE DAILY.  . fluticasone (FLOVENT HFA) 110 MCG/ACT inhaler Inhale 1 puff into the lungs 2 (two) times daily.  Marland Kitchen spironolactone  (ALDACTONE) 25 MG tablet Take 0.5 tablets (12.5 mg total) by mouth daily.  Alveda Reasons 20 MG TABS tablet TAKE ONE TABLET BY MOUTH EVERY DAY WITH SUPPER.     Allergies:   Patient has no known allergies.   Social History   Socioeconomic History  . Marital status: Married    Spouse name: Not on file  . Number of children: Not on file  . Years of education: Not on file  . Highest education level: Not on file  Occupational History  . Not on file  Tobacco Use  . Smoking status: Never Smoker  . Smokeless tobacco: Never Used  Substance and Sexual Activity  . Alcohol use: Yes    Alcohol/week: 14.0 standard drinks    Types: 10 Cans of beer, 1 Shots of liquor, 3 Glasses of wine per week  . Drug use: No  . Sexual activity: Not on file  Other Topics Concern  . Not on file  Social History Narrative  . Not on file   Social Determinants of Health   Financial Resource Strain:   . Difficulty of Paying Living Expenses:   Food Insecurity:   . Worried About Charity fundraiser in the Last Year:   . Arboriculturist in the Last Year:   Transportation Needs:   . Film/video editor (Medical):   Marland Kitchen Lack of Transportation (Non-Medical):  Physical Activity:   . Days of Exercise per Week:   . Minutes of Exercise per Session:   Stress:   . Feeling of Stress :   Social Connections:   . Frequency of Communication with Friends and Family:   . Frequency of Social Gatherings with Friends and Family:   . Attends Religious Services:   . Active Member of Clubs or Organizations:   . Attends Archivist Meetings:   Marland Kitchen Marital Status:      Family History: The patient's family history includes Heart failure in his mother; Pleurisy in his mother. ROS:   Please see the history of present illness.    All 14 point review of systems negative except as described per history of present illness  EKGs/Labs/Other Studies Reviewed:      Recent Labs: 07/05/2019: BUN 11; Creatinine, Ser 0.97;  Potassium 4.7; Sodium 139  Recent Lipid Panel No results found for: CHOL, TRIG, HDL, CHOLHDL, VLDL, LDLCALC, LDLDIRECT  Physical Exam:    VS:  BP 132/68   Pulse (!) 59   Ht 5\' 11"  (1.803 m)   Wt 239 lb 3.2 oz (108.5 kg)   SpO2 97%   BMI 33.36 kg/m     Wt Readings from Last 3 Encounters:  09/18/19 239 lb 3.2 oz (108.5 kg)  06/25/19 242 lb (109.8 kg)  05/23/19 237 lb (107.5 kg)     GEN:  Well nourished, well developed in no acute distress HEENT: Normal NECK: No JVD; No carotid bruits LYMPHATICS: No lymphadenopathy CARDIAC: RRR, no murmurs, no rubs, no gallops RESPIRATORY:  Clear to auscultation without rales, wheezing or rhonchi  ABDOMEN: Soft, non-tender, non-distended MUSCULOSKELETAL:  No edema; No deformity  SKIN: Warm and dry LOWER EXTREMITIES: no swelling NEUROLOGIC:  Alert and oriented x 3 PSYCHIATRIC:  Normal affect   ASSESSMENT:    1. Dilated cardiomyopathy ([place])   2. Essential hypertension   3. Abnormal stress test minimal ischemia involving apex   4. History of DVT (deep vein thrombosis)   5. History of pulmonary embolism    PLAN:    In order of problems listed above:  1. Dilated cardiomyopathy on appropriate medical therapy echocardiogram will be repeated to recheck left ventricle ejection fraction. 2. Essential hypertension: Blood pressure well controlled continue present management. 3. Abnormal stress test showed minimal area of ischemia involving apex.  Patient does not want to have a cardiac catheterization at this stage.  However, if his echocardiogram showed persistence of diminished left ventricle ejection fraction then cardiac catheterization will be reconsidered. 4. History of DVT as well as PE.  Continue anticoagulation with Xarelto. 5. I have his cholesterol from [facility] this is from January 2020.  We will recheck his fasting lipid profile.   Medication Adjustments/Labs and Tests Ordered: Current medicines are reviewed at length with the patient  today.  Concerns regarding medicines are outlined above.  No orders of the defined types were placed in this encounter.  Medication changes: No orders of the defined types were placed in this encounter.   Signed, Park Liter, MD, [facility] 09/18/2019 9:34 AM    [place]

## 2019-09-18 NOTE — Patient Instructions (Signed)
Medication Instructions:  Your physician recommends that you continue on your current medications as directed. Please refer to the Current Medication list given to you today.  *If you need a refill on your cardiac medications before your next appointment, please call your pharmacy*   Lab Work: Your physician recommends that you return for lab work today: lipid, bmp   If you have labs (blood work) drawn today and your tests are completely normal, you will receive your results only by:  [place] (if you have MyChart) OR  A paper copy in the mail If you have any lab test that is abnormal or we need to change your treatment, we will call you to review the results.   Testing/Procedures: Your physician has requested that you have an echocardiogram. Echocardiography is a painless test that uses sound waves to create images of your heart. It provides your doctor with information about the size and shape of your heart and how well your hearts chambers and valves are working. This procedure takes approximately one hour. There are no restrictions for this procedure.      Follow-Up: At [facility], you and your health needs are our priority.  As part of our continuing mission to provide you with exceptional heart care, we have created designated Provider Care Teams.  These Care Teams include your primary Cardiologist (physician) and Advanced Practice Providers (APPs -  Physician Assistants and Nurse Practitioners) who all work together to provide you with the care you need, when you need it.  We recommend signing up for the patient portal called "MyChart".  Sign up information is provided on this After Visit Summary.  MyChart is used to connect with patients for Virtual Visits (Telemedicine).  Patients are able to view lab/test results, encounter notes, upcoming appointments, etc.  Non-urgent messages can be sent to your provider as well.   To learn more about what you can do with MyChart,  go to [email or web address].    Your next appointment:   5 month(s)  The format for your next appointment:   In Person  Provider:   Jenne Campus, MD   Other Instructions   Echocardiogram An echocardiogram is a procedure that uses painless sound waves (ultrasound) to produce an image of the heart. Images from an echocardiogram can provide important information about:  Signs of coronary artery disease (CAD).  Aneurysm detection. An aneurysm is a weak or damaged part of an artery wall that bulges out from the normal force of blood pumping through the body.  Heart size and shape. Changes in the size or shape of the heart can be associated with certain conditions, including heart failure, aneurysm, and CAD.  Heart muscle function.  Heart valve function.  Signs of a past heart attack.  Fluid buildup around the heart.  Thickening of the heart muscle.  A tumor or infectious growth around the heart valves. Tell a health care provider about:  Any allergies you have.  All medicines you are taking, including vitamins, herbs, eye drops, creams, and over-the-counter medicines.  Any blood disorders you have.  Any surgeries you have had.  Any medical conditions you have.  Whether you are pregnant or may be pregnant. What are the risks? Generally, this is a safe procedure. However, problems may occur, including:  Allergic reaction to dye (contrast) that may be used during the procedure. What happens before the procedure? No specific preparation is needed. You may eat and drink normally. What happens during the procedure?   An  IV tube may be inserted into one of your veins.  You may receive contrast through this tube. A contrast is an injection that improves the quality of the pictures from your heart.  A gel will be applied to your chest.  A wand-like tool (transducer) will be moved over your chest. The gel will help to transmit the sound waves from the  transducer.  The sound waves will harmlessly bounce off of your heart to allow the heart images to be captured in real-time motion. The images will be recorded on a computer. The procedure may vary among health care providers and hospitals. What happens after the procedure?  You may return to your normal, everyday life, including diet, activities, and medicines, unless your health care provider tells you not to do that. Summary  An echocardiogram is a procedure that uses painless sound waves (ultrasound) to produce an image of the heart.  Images from an echocardiogram can provide important information about the size and shape of your heart, heart muscle function, heart valve function, and fluid buildup around your heart.  You do not need to do anything to prepare before this procedure. You may eat and drink normally.  After the echocardiogram is completed, you may return to your normal, everyday life, unless your health care provider tells you not to do that. This information is not intended to replace advice given to you by your health care provider. Make sure you discuss any questions you have with your health care provider. Document Revised: 07/27/2018 Document Reviewed: 05/08/2016 Elsevier Patient Education  [place].

## 2019-10-01 ENCOUNTER — Other Ambulatory Visit: Payer: Self-pay | Admitting: Cardiology

## 2019-10-01 NOTE — Telephone Encounter (Signed)
LOV was 09/18/2019 with Dr. Raliegh Ip

## 2019-10-04 ENCOUNTER — Ambulatory Visit (INDEPENDENT_AMBULATORY_CARE_PROVIDER_SITE_OTHER): Payer: Medicare Other

## 2019-10-04 ENCOUNTER — Other Ambulatory Visit: Payer: Medicare Other

## 2019-10-04 ENCOUNTER — Other Ambulatory Visit: Payer: Self-pay

## 2019-10-04 DIAGNOSIS — I420 Dilated cardiomyopathy: Secondary | ICD-10-CM

## 2019-11-08 ENCOUNTER — Encounter: Payer: Self-pay | Admitting: Allergy and Immunology

## 2019-11-08 ENCOUNTER — Ambulatory Visit (INDEPENDENT_AMBULATORY_CARE_PROVIDER_SITE_OTHER): Payer: Medicare Other | Admitting: Allergy and Immunology

## 2019-11-08 ENCOUNTER — Other Ambulatory Visit: Payer: Self-pay

## 2019-11-08 ENCOUNTER — Telehealth: Payer: Self-pay

## 2019-11-08 VITALS — BP 114/62 | HR 60 | Resp 16 | Ht 70.5 in | Wt 238.0 lb

## 2019-11-08 DIAGNOSIS — K219 Gastro-esophageal reflux disease without esophagitis: Secondary | ICD-10-CM | POA: Diagnosis not present

## 2019-11-08 DIAGNOSIS — Z8709 Personal history of other diseases of the respiratory system: Secondary | ICD-10-CM | POA: Diagnosis not present

## 2019-11-08 DIAGNOSIS — K224 Dyskinesia of esophagus: Secondary | ICD-10-CM

## 2019-11-08 DIAGNOSIS — J383 Other diseases of vocal cords: Secondary | ICD-10-CM | POA: Diagnosis not present

## 2019-11-08 DIAGNOSIS — R1310 Dysphagia, unspecified: Secondary | ICD-10-CM | POA: Diagnosis not present

## 2019-11-08 DIAGNOSIS — J310 Chronic rhinitis: Secondary | ICD-10-CM

## 2019-11-08 DIAGNOSIS — R05 Cough: Secondary | ICD-10-CM | POA: Diagnosis not present

## 2019-11-08 MED ORDER — FAMOTIDINE 40 MG PO TABS
40.0000 mg | ORAL_TABLET | Freq: Every day | ORAL | 5 refills | Status: DC
Start: 2019-11-08 — End: 2020-05-12

## 2019-11-08 MED ORDER — OMEPRAZOLE 40 MG PO CPDR: 40.0000 mg | DELAYED_RELEASE_CAPSULE | Freq: Every morning | ORAL | 5 refills | Status: DC

## 2019-11-08 MED ORDER — IPRATROPIUM BROMIDE 0.06 % NA SOLN: NASAL | 5 refills | Status: DC

## 2019-11-08 NOTE — Telephone Encounter (Signed)
Please refer to [facility] for Laryngopharyngeal reflux.  They prefer faxed referrals.  Thank you. Please do not schedule between August 12 to 23rd.

## 2019-11-08 NOTE — Patient Instructions (Addendum)
  1. Obtain a Chest X-ray  2. Obtain a Barium swallow  3. Obtain a ENT evaluation of throat  4. Treat reflux / LPR:   A. Omeprazole 40 mg in AM  B. Famotidine 40 mg in PM  C. Decrease caffeine and alcohol consumption  5. Eliminate use of Flovent  6. Can use nasal ipratropium 0.06% - 2 sprays each nostril every 6 hours to dry up nose. Can use prior to eating.  7. Entresto???  8. Return to clinic in 4 weeks or earlier if needed

## 2019-11-08 NOTE — Progress Notes (Signed)
[place] - [place] - [place] - [place] - [place]   Dear Dr. Helene Kelp,  Thank you for referring Allen Macdonald to the [facility] on 11/08/2019.   Below is a summation of this patient's evaluation and recommendations.  Thank you for your referral. I will keep you informed about this patient's response to treatment.   If you have any questions please do not hesitate to contact me.   Sincerely,  Jiles Prows, MD Allergy / Immunology [place]   ______________________________________________________________________    NEW PATIENT NOTE  Referring Provider: Ronita Hipps, MD Primary Provider: Ronita Hipps, MD Date of office visit: 11/08/2019    Subjective:   Chief Complaint:  Allen Macdonald (DOB: 1951/02/09) is a 69 y.o. male who presents to the clinic on 11/08/2019 with a chief complaint of Cough .     HPI: Allen Macdonald presents to this clinic in evaluation of respiratory tract problems.  Approximately 2 months ago he acutely lost his voice and he cannot project his voice with any volume.  In addition, he has had some discomfort in his mid chest when he swallows and when he swallows he has had some intermittent cough.  This intermittent cough and discomfort with swallowing actually predates his loss of voice by a year or so.  He has a long history of indigestion with foods especially if he eats the "wrong food".  He does consume approximately 4 cups of coffee per day and drinks 6 beers per day several times per week.  He does not have any shortness of breath or chest tightness or coughing spells or other respiratory tract symptoms.  He has a little bit of runny nose especially when he eats but he does not have any sneezing or nasal congestion or anosmia or loss of taste.  Apparently he was started on Flovent 20 years ago for cough.  He started Enhaut about a month prior to losing his  voice.  He is using Entresto for history of cardiomyopathy.  His most recent echocardiogram 2021 identified very good heart function.  He has a history of prostate cancer diagnosed in 2003 and treated with prostate radioactive seeds.  Apparently had an increase in his PSA and was started on antiandrogenic medicines including the use of Lupron and bicalutamide with a very good response driving his PSA to 0.  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  His last chest x-ray was 2014 at which point in time he had a PE and DVT.  Past Medical History:  Diagnosis Date  . Dilated cardiomyopathy ([place])   . History of pulmonary embolus (PE)     Past Surgical History:  Procedure Laterality Date  . INSERTION PROSTATE RADIATION SEED  2003  . PROSTATE SURGERY    . Venacava Filter      Allergies as of 11/08/2019   No Known Allergies     Medication List      bicalutamide 50 MG tablet Commonly known as: CASODEX Take 50 mg by mouth daily.   carvedilol 12.5 MG tablet Commonly known as: COREG Take 1 tablet (12.5 mg total) by mouth 2 (two) times daily.   DENOSUMAB [place] Inject into the skin every 6 (six) months.   Entresto 49-51 MG Generic drug: sacubitril-valsartan TAKE 1 TABLET BY MOUTH TWICE DAILY.   fluticasone 110  MCG/ACT inhaler Commonly known as: FLOVENT HFA Inhale 1 puff into the lungs 2 (two) times daily.   LUPRON IJ Inject as directed every 6 (six) months.   spironolactone 25 MG tablet Commonly known as: ALDACTONE Take 12.5 mg by mouth daily.   Xarelto 20 MG Tabs tablet Generic drug: rivaroxaban TAKE ONE TABLET BY MOUTH EVERY DAY WITH SUPPER       Review of systems negative except as noted in HPI / PMHx or noted below:  Review of Systems  Constitutional: Negative.   HENT: Negative.   Eyes: Negative.   Respiratory: Negative.   Cardiovascular: Negative.     Gastrointestinal: Negative.   Genitourinary: Negative.   Musculoskeletal: Negative.   Skin: Negative.   Neurological: Negative.   Endo/Heme/Allergies: Negative.   Psychiatric/Behavioral: Negative.     Family History  Problem Relation Age of Onset  . Heart failure Mother   . Pleurisy Mother     Social History   Socioeconomic History  . Marital status: Married    Spouse name: Not on file  . Number of children: Not on file  . Years of education: Not on file  . Highest education level: Not on file  Occupational History  . Not on file  Tobacco Use  . Smoking status: Never Smoker  . Smokeless tobacco: Never Used  Vaping Use  . Vaping Use: Never used  Substance and Sexual Activity  . Alcohol use: Yes    Alcohol/week: 14.0 standard drinks    Types: 10 Cans of beer, 1 Shots of liquor, 3 Glasses of wine per week  . Drug use: No  . Sexual activity: Not on file  Other Topics Concern  . Not on file  Social History Narrative  . Not on file    Environmental and Social history  Lives in a house with a dry environment, no animals located inside the household, carpet in the bedroom, no plastic on the bed, plastic on the pillow, no smoking ongoing with inside the household.  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  Objective:   Vitals:   11/08/19 1007  BP: 114/62  Pulse: 60  Resp: 16  SpO2: 96%   Height: 5' 10.5" (179.1 cm) Weight: 238 lb (108 kg)  Physical Exam Constitutional:      Appearance: He is not diaphoretic.     Comments: Raspy low-volume voice  HENT:     Head: Normocephalic.     Right Ear: Tympanic membrane, ear canal and external ear normal.     Left Ear: Tympanic membrane, ear canal and external ear normal.     Nose: Nose normal. No mucosal edema or rhinorrhea.     Mouth/Throat:     Pharynx: Uvula midline. No oropharyngeal exudate.  Eyes:     Conjunctiva/sclera: Conjunctivae normal.  Neck:     Thyroid: No thyromegaly.      Trachea: Trachea normal. No tracheal tenderness or tracheal deviation.  Cardiovascular:     Rate and Rhythm: Normal rate and regular rhythm.     Heart sounds: Normal heart sounds, S1 normal and S2 normal. No murmur heard.   Pulmonary:     Effort: No respiratory distress.     Breath sounds: Normal breath sounds. No stridor. No wheezing or rales.  Lymphadenopathy:     Head:     Right side of head: No tonsillar adenopathy.     Left side of head: No tonsillar adenopathy.     Cervical: No cervical  adenopathy.  Skin:    Findings: No erythema or rash.     Nails: There is no clubbing.  Neurological:     Mental Status: He is alert.     Diagnostics: Allergy skin tests were not performed.   Spirometry was performed and demonstrated an FEV1 of 3.24 @ 100 % of predicted. FEV1/FVC = 0.76   Results of an echocardiogram obtained 04 October 2019 identified the following:  1. Left ventricular ejection fraction, by estimation, is 55 to 60%. The  left ventricle has normal function. The left ventricle has no regional  wall motion abnormalities. Left ventricular diastolic parameters are  consistent with Grade I diastolic  dysfunction (impaired relaxation).  2. Left atrial size was mildly dilated.  3. The mitral valve is normal in structure. Mild mitral valve  regurgitation. No evidence of mitral stenosis.  4. The aortic valve is normal in structure. Aortic valve regurgitation is  not visualized. No aortic stenosis is present.    Assessment and Plan:    1. Vocal cord dysfunction   2. LPRD (laryngopharyngeal reflux disease)   3. Esophageal dysmotility   4. History of asthma   5. Nonallergic rhinitis     1. Obtain a Chest X-ray  2. Obtain a Barium swallow  3. Obtain a ENT evaluation of throat  4. Treat reflux / LPR:   A. Omeprazole 40 mg in AM  B. Famotidine 40 mg in PM  C. Decrease caffeine and alcohol consumption  5. Eliminate use of Flovent  6. Can use nasal ipratropium  0.06% - 2 sprays each nostril every 6 hours to dry up nose. Can use prior to eating.  7. Entresto???  8. Return to clinic in 4 weeks or earlier if needed  Allen Macdonald has a laryngeal problem and an esophageal problem and a cough in the context of asbestos exposure as a young man, a history of prostate cancer, a history of reflux, and the use of a ACE inhibitor.  We will work through these issues with the diagnostic studies noted above and empirically treat him for LPR.  Whether he requires Flovent is an open question as he really has no symptomatology suggesting active asthma and we will remove this agent at this point and see what happens as we move forward.  I will see him back in his clinic in 4 weeks or earlier if there is a problem.  Jiles Prows, MD Allergy / Immunology [place] [facility] [place]

## 2019-11-12 ENCOUNTER — Other Ambulatory Visit: Payer: Self-pay

## 2019-11-12 ENCOUNTER — Telehealth: Payer: Self-pay

## 2019-11-12 ENCOUNTER — Encounter: Payer: Self-pay | Admitting: Allergy and Immunology

## 2019-11-12 DIAGNOSIS — K224 Dyskinesia of esophagus: Secondary | ICD-10-CM | POA: Diagnosis not present

## 2019-11-12 DIAGNOSIS — K449 Diaphragmatic hernia without obstruction or gangrene: Secondary | ICD-10-CM | POA: Diagnosis not present

## 2019-11-12 MED ORDER — ENTRESTO 49-51 MG PO TABS: 1.0000 | ORAL_TABLET | Freq: Two times a day (BID) | ORAL | 2 refills | Status: DC

## 2019-11-12 NOTE — Telephone Encounter (Signed)
Refill sent for Entresto 49-51 to [facility].

## 2019-11-12 NOTE — Telephone Encounter (Signed)
Left message for patient to return call to office.   Please inform patient that per Dr. Neldon Mc, barium swallow, and chest x ray studies were both normal. He will discuss more with Kolt at follow up visit.

## 2019-11-13 NOTE — Telephone Encounter (Signed)
Wife informed of information by Bret C.

## 2019-11-14 ENCOUNTER — Telehealth: Payer: Self-pay | Admitting: Allergy and Immunology

## 2019-11-14 NOTE — Telephone Encounter (Signed)
Issachar has been referred to [facility].  When contacted, they preferred referrals via fax.  I have faxed the referral as well as placing the referral in Epic.  They are to contact the patient to schedule, and then contact me with the appointment information.  I will follow up on 7/30.

## 2019-11-19 NOTE — Telephone Encounter (Signed)
Patient scheduled for August 26th at 3:00pm.  Patient has been notified by [facility].

## 2019-11-20 ENCOUNTER — Encounter: Payer: Self-pay | Admitting: *Deleted

## 2019-12-10 ENCOUNTER — Telehealth: Payer: Self-pay | Admitting: *Deleted

## 2019-12-10 MED ORDER — SPIRONOLACTONE 25 MG PO TABS: 12.5000 mg | ORAL_TABLET | Freq: Every day | ORAL | 3 refills | Status: DC

## 2019-12-10 NOTE — Telephone Encounter (Signed)
Rx refill sent to pharmacy. 

## 2019-12-12 ENCOUNTER — Ambulatory Visit: Payer: Medicare Other | Admitting: Allergy and Immunology

## 2019-12-13 DIAGNOSIS — R490 Dysphonia: Secondary | ICD-10-CM | POA: Diagnosis not present

## 2019-12-13 DIAGNOSIS — R070 Pain in throat: Secondary | ICD-10-CM | POA: Diagnosis not present

## 2019-12-13 DIAGNOSIS — J342 Deviated nasal septum: Secondary | ICD-10-CM | POA: Diagnosis not present

## 2019-12-13 DIAGNOSIS — K219 Gastro-esophageal reflux disease without esophagitis: Secondary | ICD-10-CM | POA: Diagnosis not present

## 2019-12-13 DIAGNOSIS — J309 Allergic rhinitis, unspecified: Secondary | ICD-10-CM | POA: Diagnosis not present

## 2019-12-17 ENCOUNTER — Encounter: Payer: Self-pay | Admitting: Allergy and Immunology

## 2019-12-17 ENCOUNTER — Other Ambulatory Visit: Payer: Self-pay

## 2019-12-17 ENCOUNTER — Ambulatory Visit (INDEPENDENT_AMBULATORY_CARE_PROVIDER_SITE_OTHER): Payer: Medicare Other | Admitting: Allergy and Immunology

## 2019-12-17 VITALS — BP 110/54 | HR 52 | Resp 14

## 2019-12-17 DIAGNOSIS — J383 Other diseases of vocal cords: Secondary | ICD-10-CM

## 2019-12-17 DIAGNOSIS — K219 Gastro-esophageal reflux disease without esophagitis: Secondary | ICD-10-CM | POA: Diagnosis not present

## 2019-12-17 DIAGNOSIS — J310 Chronic rhinitis: Secondary | ICD-10-CM | POA: Diagnosis not present

## 2019-12-17 NOTE — Progress Notes (Signed)
[place] - [place] - [place]   Follow-up Note  Referring Provider: Ronita Hipps, MD Primary Provider: Ronita Hipps, MD Date of Office Visit: 12/17/2019  Subjective:   Allen Macdonald (DOB: Aug 07, 1950) is a 69 y.o. male who returns to the [facility] on 12/17/2019 in re-evaluation of the following:  HPI: Allen Macdonald returns to this clinic in evaluation of of hoarseness and swallowing difficulty and slight cough addressed during his initial evaluation of 08 November 2019.  He has basically resolved the discomfort in his mid chest.  His swallowing has improved significantly though occasionally he is still little bit "sore" at the top of his throat when he swallows.  He has consolidated his caffeine from 4 cups of coffee per day to 2 cups/day and has decreased his 6 beers per day down to 4 beers per day.  He continues to treat reflux on a consistent basis.  His runny nose that occurred around the time of eating has responded to the use of his nasal ipratropium.  Allergies as of 12/17/2019   No Known Allergies     Medication List    bicalutamide 50 MG tablet Commonly known as: CASODEX Take 50 mg by mouth daily.   carvedilol 12.5 MG tablet Commonly known as: COREG Take 1 tablet (12.5 mg total) by mouth 2 (two) times daily.   DENOSUMAB [place] Inject into the skin every 6 (six) months.   Entresto 49-51 MG Generic drug: sacubitril-valsartan Take 1 tablet by mouth 2 (two) times daily.   famotidine 40 MG tablet Commonly known as: PEPCID Take 1 tablet (40 mg total) by mouth at bedtime.   ipratropium 0.06 % nasal spray Commonly known as: ATROVENT Can use two sprays in each nostril every six hours if needed to dry up runny nose.   LUPRON IJ Inject as directed every 6 (six) months.   omeprazole 40 MG capsule Commonly known as: PRILOSEC Take 1 capsule (40 mg total) by mouth in the morning.   spironolactone 25 MG tablet Commonly known as:  ALDACTONE Take 0.5 tablets (12.5 mg total) by mouth daily.   Xarelto 20 MG Tabs tablet Generic drug: rivaroxaban TAKE ONE TABLET BY MOUTH EVERY DAY WITH SUPPER       Past Medical History:  Diagnosis Date  . Dilated cardiomyopathy ([place])   . History of pulmonary embolus (PE)     Past Surgical History:  Procedure Laterality Date  . INSERTION PROSTATE RADIATION SEED  2003  . PROSTATE SURGERY    . Venacava Filter      Review of systems negative except as noted in HPI / PMHx or noted below:  Review of Systems  Constitutional: Negative.   HENT: Negative.   Eyes: Negative.   Respiratory: Negative.   Cardiovascular: Negative.   Gastrointestinal: Negative.   Genitourinary: Negative.   Musculoskeletal: Negative.   Skin: Negative.   Neurological: Negative.   Endo/Heme/Allergies: Negative.   Psychiatric/Behavioral: Negative.      Objective:   Vitals:   12/17/19 0955  BP: (!) 110/54  Pulse: (!) 52  Resp: 14  SpO2: 97%          Physical Exam Constitutional:      Appearance: He is not diaphoretic.  HENT:     Head: Normocephalic.     Right Ear: Tympanic membrane, ear canal and external ear normal.     Left Ear: Tympanic membrane, ear canal and external ear normal.     Nose: Nose  normal. No mucosal edema or rhinorrhea.     Mouth/Throat:     Pharynx: Uvula midline. No oropharyngeal exudate.  Eyes:     Conjunctiva/sclera: Conjunctivae normal.  Neck:     Thyroid: No thyromegaly.     Trachea: Trachea normal. No tracheal tenderness or tracheal deviation.  Cardiovascular:     Rate and Rhythm: Normal rate and regular rhythm.     Heart sounds: Normal heart sounds, S1 normal and S2 normal. No murmur heard.   Pulmonary:     Effort: No respiratory distress.     Breath sounds: Normal breath sounds. No stridor. No wheezing or rales.  Lymphadenopathy:     Head:     Right side of head: No tonsillar adenopathy.     Left side of head: No tonsillar adenopathy.     Cervical:  No cervical adenopathy.  Skin:    Findings: No erythema or rash.     Nails: There is no clubbing.  Neurological:     Mental Status: He is alert.     Diagnostics:    Results of a chest x-ray obtained 08 November 2019 identified mild peribronchial thickening.  Results of a barium swallow obtained 12 November 2019 identified no significant abnormality.  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  Assessment and Plan:   1. LPRD (laryngopharyngeal reflux disease)   2. Nonallergic rhinitis   3. Vocal cord mass     1. Treat reflux / LPR:   A. Omeprazole 40 mg in AM  B. Famotidine 40 mg in PM  C. Decrease caffeine and alcohol consumption  2. Can use nasal ipratropium 0.06% - 2 sprays each nostril every 6 hours to dry up nose. Can use prior to eating.  3. Biopsy of vocal cord with ENT  4. Return to clinic in December 2021 or earlier if problem  Allen Macdonald will continue to treat his reflux issue with the therapy noted above and his nonallergic rhinitis with nasal ipratropium and he will follow up with ENT regarding further evaluation and treatment of his vocal cord mass.  I will see him back in this clinic in December 2021 or earlier if there is a problem.  Allena Katz, MD Allergy / Immunology [place]

## 2019-12-17 NOTE — Patient Instructions (Signed)
  1. Treat reflux / LPR:   A. Omeprazole 40 mg in AM  B. Famotidine 40 mg in PM  C. Decrease caffeine and alcohol consumption  2. Can use nasal ipratropium 0.06% - 2 sprays each nostril every 6 hours to dry up nose. Can use prior to eating.  3. Biopsy of vocal cord with ENT  4. Return to clinic in December 2021 or earlier if problem

## 2019-12-18 ENCOUNTER — Encounter: Payer: Self-pay | Admitting: Allergy and Immunology

## 2020-01-08 ENCOUNTER — Telehealth: Payer: Self-pay | Admitting: Cardiology

## 2020-01-08 NOTE — Telephone Encounter (Signed)
   [facility] Pre-operative Risk Assessment    HEARTCARE STAFF: - Please ensure there is not already an duplicate clearance open for this procedure. - Under Visit Info/Reason for Call, type in Other and utilize the format Clearance MM/DD/YY or Clearance TBD. Do not use dashes or single digits. - If request is for dental extraction, please clarify the # of teeth to be extracted.  Request for surgical clearance:  1. What type of surgery is being performed? Laryngoscopy with Biopsy  2. When is this surgery scheduled? 01/23/20  3. What type of clearance is required (medical clearance vs. Pharmacy clearance to hold med vs. Both)? Both  4. Are there any medications that need to be held prior to surgery and how long? Xarelto 5-7 days   5. Practice name and name of physician performing surgery? Dr. Cletis [place], [facility]  6. What is the office phone number? [phone]    7.   What is the office fax number? [phone]  8.   Anesthesia type (None, local, MAC, general) ? general   Allen Macdonald 01/08/2020, 4:39 PM  _________________________________________________________________   (provider comments below)

## 2020-01-09 NOTE — Telephone Encounter (Signed)
Patient with diagnosis of DVT and PE on Xarelto for anticoagulation.    Procedure: Laryngoscopy with Biopsy Date of procedure: 01/23/20  CrCl >177mL/min Platelet count 185K  Requested 5-7 day hold is excessive for Xarelto as it will be cleared in a day. Per office protocol, patient can hold Xarelto for 1 day prior to procedure given history of recurrent VTE.

## 2020-01-09 NOTE — Telephone Encounter (Signed)
Left voicemail to call back.  Pharmacy to review anticoagulation. 

## 2020-01-09 NOTE — Telephone Encounter (Signed)
   Primary Cardiologist: Jenne Campus, MD  Chart reviewed as part of pre-operative protocol coverage. Patient was contacted 01/09/2020 in reference to pre-operative risk assessment for pending surgery as outlined below.  Recardo Linn was last seen on 09/18/19 by Dr Agustin Cree for hx of cardiomyopathy that improved from 35% to 60%.  He is on meds to improve this. He is active and no chest pain.  Since that day, Micael Barb has done well from cardiac perspective.   Therefore, based on ACC/AHA guidelines, the patient would be at acceptable risk for the planned procedure without further cardiovascular testing.   For Xarelto see note from pharmacist.  We recommend holding 24 hours pre and resume post op when safe to do so.     The patient was advised that if he develops new symptoms prior to surgery to contact our office to arrange for a follow-up visit, and he verbalized understanding.  I will route this recommendation to the requesting party via Epic fax function and remove from pre-op pool. Please call with questions.  Cecilie Kicks, NP 01/09/2020, 1:59 PM

## 2020-01-09 NOTE — Telephone Encounter (Signed)
Fu   Wife returning call to Preop   Best call back number is [phone]

## 2020-01-14 DIAGNOSIS — Z86711 Personal history of pulmonary embolism: Secondary | ICD-10-CM | POA: Diagnosis not present

## 2020-01-14 DIAGNOSIS — R070 Pain in throat: Secondary | ICD-10-CM | POA: Diagnosis not present

## 2020-01-14 DIAGNOSIS — R490 Dysphonia: Secondary | ICD-10-CM | POA: Diagnosis not present

## 2020-01-14 DIAGNOSIS — J383 Other diseases of vocal cords: Secondary | ICD-10-CM | POA: Diagnosis not present

## 2020-01-14 DIAGNOSIS — D380 Neoplasm of uncertain behavior of larynx: Secondary | ICD-10-CM | POA: Diagnosis not present

## 2020-01-15 NOTE — Telephone Encounter (Signed)
I forwarded Dr. Wendy Poet recommendation to [facility]

## 2020-01-15 NOTE — Telephone Encounter (Signed)
Called by the patient's wife, surgeon really prefers to hold Xarelto for 3 days. Family is aware longer holding time may cause increased chance of DVT/PE. Patient's wife want Dr. Wendy Poet recommendation in this case.   Dr. Agustin Cree, please forward your recommendation to [facility] PREOP

## 2020-01-15 NOTE — Telephone Encounter (Signed)
Within normal kidney function we do not usually hold Xarelto for more than 2 days but if surgeon insists 3 days should be fine please be aware that it does carry some risks of thrombosis.  Also we extend duration of holding of Xarelto in situations that we dealing with neurological procedure.  It does not look this is the type of procedure.  Again ideally 48 hours no more.

## 2020-01-15 NOTE — Telephone Encounter (Signed)
Follow Up:    Wife calling back, concerning pt's Xarelto.

## 2020-01-17 DIAGNOSIS — Z1152 Encounter for screening for COVID-19: Secondary | ICD-10-CM | POA: Diagnosis not present

## 2020-01-17 DIAGNOSIS — Z1159 Encounter for screening for other viral diseases: Secondary | ICD-10-CM | POA: Diagnosis not present

## 2020-01-21 NOTE — Telephone Encounter (Signed)
I called and spoke with the patient. He has already held his xarelto today in anticipation of surgery on 01/23/20.   I will forward to the requesting office again, please reach out to them and let them know.   Ledora Bottcher, PA-C 01/21/2020, 10:09 AM [place] [address] [place], [place] [address]

## 2020-01-21 NOTE — Telephone Encounter (Signed)
Allen Macdonald is calling wanting to confirm it is okay for the patient to stop the Xarelto today, due to his surgery being 01/23/20. She states if she does not answer a VM can be left confirming.

## 2020-01-23 DIAGNOSIS — Z79899 Other long term (current) drug therapy: Secondary | ICD-10-CM | POA: Diagnosis not present

## 2020-01-23 DIAGNOSIS — I447 Left bundle-branch block, unspecified: Secondary | ICD-10-CM | POA: Diagnosis not present

## 2020-01-23 DIAGNOSIS — M1990 Unspecified osteoarthritis, unspecified site: Secondary | ICD-10-CM | POA: Diagnosis not present

## 2020-01-23 DIAGNOSIS — Z86718 Personal history of other venous thrombosis and embolism: Secondary | ICD-10-CM | POA: Diagnosis not present

## 2020-01-23 DIAGNOSIS — I519 Heart disease, unspecified: Secondary | ICD-10-CM | POA: Diagnosis not present

## 2020-01-23 DIAGNOSIS — R001 Bradycardia, unspecified: Secondary | ICD-10-CM | POA: Diagnosis not present

## 2020-01-23 DIAGNOSIS — C006 Malignant neoplasm of commissure of lip, unspecified: Secondary | ICD-10-CM | POA: Diagnosis not present

## 2020-01-23 DIAGNOSIS — Z86711 Personal history of pulmonary embolism: Secondary | ICD-10-CM | POA: Diagnosis not present

## 2020-01-23 DIAGNOSIS — I10 Essential (primary) hypertension: Secondary | ICD-10-CM | POA: Diagnosis not present

## 2020-01-23 DIAGNOSIS — D380 Neoplasm of uncertain behavior of larynx: Secondary | ICD-10-CM | POA: Diagnosis not present

## 2020-01-23 DIAGNOSIS — K219 Gastro-esophageal reflux disease without esophagitis: Secondary | ICD-10-CM | POA: Diagnosis not present

## 2020-01-23 DIAGNOSIS — J45909 Unspecified asthma, uncomplicated: Secondary | ICD-10-CM | POA: Diagnosis not present

## 2020-01-23 DIAGNOSIS — D491 Neoplasm of unspecified behavior of respiratory system: Secondary | ICD-10-CM | POA: Diagnosis not present

## 2020-01-23 DIAGNOSIS — R490 Dysphonia: Secondary | ICD-10-CM | POA: Diagnosis not present

## 2020-01-23 DIAGNOSIS — C320 Malignant neoplasm of glottis: Secondary | ICD-10-CM | POA: Diagnosis not present

## 2020-01-23 DIAGNOSIS — J383 Other diseases of vocal cords: Secondary | ICD-10-CM | POA: Diagnosis not present

## 2020-01-31 DIAGNOSIS — C320 Malignant neoplasm of glottis: Secondary | ICD-10-CM | POA: Diagnosis not present

## 2020-01-31 DIAGNOSIS — H9201 Otalgia, right ear: Secondary | ICD-10-CM | POA: Diagnosis not present

## 2020-01-31 DIAGNOSIS — J342 Deviated nasal septum: Secondary | ICD-10-CM | POA: Diagnosis not present

## 2020-01-31 DIAGNOSIS — R070 Pain in throat: Secondary | ICD-10-CM | POA: Diagnosis not present

## 2020-01-31 DIAGNOSIS — C329 Malignant neoplasm of larynx, unspecified: Secondary | ICD-10-CM | POA: Diagnosis not present

## 2020-02-12 DIAGNOSIS — C61 Malignant neoplasm of prostate: Secondary | ICD-10-CM | POA: Diagnosis not present

## 2020-02-20 DIAGNOSIS — C329 Malignant neoplasm of larynx, unspecified: Secondary | ICD-10-CM | POA: Diagnosis not present

## 2020-02-20 DIAGNOSIS — R7301 Impaired fasting glucose: Secondary | ICD-10-CM | POA: Diagnosis not present

## 2020-02-20 LAB — POCT GLUCOSE (2 HR PP): Glucose 2 Hr PP, POC: 109 mg/dL

## 2020-02-22 DIAGNOSIS — Z86711 Personal history of pulmonary embolism: Secondary | ICD-10-CM | POA: Insufficient documentation

## 2020-02-24 NOTE — Progress Notes (Signed)
[facility]  [address] [place],    [address] [phone]  Clinic Day:  02/25/2020  Referring physician: Ronita Hipps, MD   HISTORY OF PRESENT ILLNESS:  The patient is a 69 y.o. male with early stage laryngeal cancer.  He comes in today to go over his PET scan to determine if he has any evidence of metastatic disease.  Since his last visit, the patient has been doing well.  Outside of persistent hoarseness, he denies having other symptoms which concern his for disease progression.  PHYSICAL EXAM:  Blood pressure (!) 165/77, pulse (!) 59, temperature 98 F (36.7 C), temperature source Oral, resp. rate 16, height 5' 10.5" (1.791 m), weight 240 lb 4.8 oz (109 kg), SpO2 96 %. Wt Readings from Last 3 Encounters:  02/25/20 240 lb 4.8 oz (109 kg)  02/25/20 241 lb (109.3 kg)  11/08/19 238 lb (108 kg)   Body mass index is 33.99 kg/m. Performance status (ECOG): 1 Physical Exam Constitutional:      Appearance: Normal appearance. He is not ill-appearing.  HENT:     Mouth/Throat:     Mouth: Mucous membranes are moist.     Pharynx: Oropharynx is clear. No oropharyngeal exudate or posterior oropharyngeal erythema.  Cardiovascular:     Rate and Rhythm: Normal rate and regular rhythm.     Heart sounds: No murmur heard.  No friction rub. No gallop.   Pulmonary:     Effort: Pulmonary effort is normal. No respiratory distress.     Breath sounds: Normal breath sounds. No wheezing, rhonchi or rales.  Abdominal:     General: Bowel sounds are normal. There is no distension.     Palpations: Abdomen is soft. There is no mass.     Tenderness: There is no abdominal tenderness.  Musculoskeletal:        General: No swelling.     Right lower leg: No edema.     Left lower leg: No edema.  Lymphadenopathy:     Cervical: No cervical adenopathy.     Upper Body:     Right upper body: No supraclavicular or axillary adenopathy.     Left upper body: No  supraclavicular or axillary adenopathy.     Lower Body: No right inguinal adenopathy. No left inguinal adenopathy.  Skin:    General: Skin is warm.     Coloration: Skin is not jaundiced.     Findings: No lesion or rash.  Neurological:     General: No focal deficit present.     Mental Status: He is alert and oriented to person, place, and time. Mental status is at baseline.     Cranial Nerves: Cranial nerves are intact.  Psychiatric:        Mood and Affect: Mood normal.        Behavior: Behavior normal.        Thought Content: Thought content normal.     STUDIES:  His PET scan revealed the following: FINDINGS: Mediastinal blood pool activity: SUV max 2.78  Liver activity: SUV max NA  NECK: Intense activity in the anterior commissure of the larynx with SUV max equal 17.8. There are no hypermetabolic cervical lymph nodes. Physiologic metabolic activity in the submandibular glands.  Incidental CT findings: none  CHEST: Normal mediastinum and cardiac silhouette. Normal pulmonary vasculature. No evidence of effusion, infiltrate, or pneumothorax. No acute bony abnormality.  Incidental CT findings: none  ABDOMEN/PELVIS: No abnormal hypermetabolic activity within the liver, pancreas, adrenal  glands, or spleen. No hypermetabolic lymph nodes in the abdomen or pelvis.  Incidental CT findings: Infrarenal IVC filter noted. Brachytherapy seeds in the prostate gland  SKELETON: No focal hypermetabolic activity to suggest skeletal metastasis.  Incidental CT findings: none  IMPRESSION: 1. Intense metabolic activity in the anterior commissure of the larynx consistent with head neck carcinoma. 2. No evidence of metastatic adenopathy in the neck. 3. No evidence distant metastatic disease.  ASSESSMENT & PLAN:   Assessment/Plan:  A 69 y.o. male who clinically appears to have only stage I (T1 N0 M0) laryngeal cancer.  In clinic today, I went over his PET scan images with him, for which  he could see he has no evidence of nodal or distant metastasis.  Based upon this, I do not think he warrants concurrent chemotherapy.  He likely only needs definitive radiation to eradicate his disease.  I will turn his care over to radiation oncology so they may spearhead his treatment plan.  I would not have a problem seeing him in the future if new issues arise that require repeat medical oncologic evaluation.  The patient understands all the plans discussed today and is in agreement with them.     Allessandra Bernardi Macarthur Critchley, MD

## 2020-02-25 ENCOUNTER — Other Ambulatory Visit: Payer: Self-pay

## 2020-02-25 ENCOUNTER — Encounter: Payer: Self-pay | Admitting: Cardiology

## 2020-02-25 ENCOUNTER — Inpatient Hospital Stay: Payer: Medicare Other | Attending: Oncology | Admitting: Oncology

## 2020-02-25 ENCOUNTER — Ambulatory Visit: Payer: Medicare Other | Admitting: Cardiology

## 2020-02-25 ENCOUNTER — Encounter: Payer: Self-pay | Admitting: Oncology

## 2020-02-25 VITALS — BP 165/77 | HR 59 | Temp 98.0°F | Resp 16 | Ht 70.5 in | Wt 240.3 lb

## 2020-02-25 VITALS — BP 110/64 | HR 58 | Ht 70.5 in | Wt 241.0 lb

## 2020-02-25 DIAGNOSIS — Z86711 Personal history of pulmonary embolism: Secondary | ICD-10-CM

## 2020-02-25 DIAGNOSIS — C329 Malignant neoplasm of larynx, unspecified: Secondary | ICD-10-CM | POA: Insufficient documentation

## 2020-02-25 DIAGNOSIS — R9439 Abnormal result of other cardiovascular function study: Secondary | ICD-10-CM | POA: Diagnosis not present

## 2020-02-25 DIAGNOSIS — I10 Essential (primary) hypertension: Secondary | ICD-10-CM

## 2020-02-25 DIAGNOSIS — I420 Dilated cardiomyopathy: Secondary | ICD-10-CM | POA: Diagnosis not present

## 2020-02-25 HISTORY — DX: Malignant neoplasm of larynx, unspecified: C32.9

## 2020-02-25 NOTE — Progress Notes (Signed)
Cardiology Office Note:    Date:  02/25/2020   ID:  Allen Macdonald, DOB 1950-05-07, MRN [id number]  PCP:  Allen Hipps, MD  Cardiologist:  Allen Campus, MD    Referring MD: Allen Hipps, MD   Chief Complaint  Patient presents with  . Follow-up  I am doing well cardiac wise  History of Present Illness:    Allen Macdonald is a 68 y.o. male with past medical history significant for cardiomyopathy ejection fraction 35%, then he was put on appropriate medication with improvement however following echocardiogram showed deterioration of left ventricle ejection fraction.  Stress test was done which showed small/minimal area showing ischemia involving apex.  Cardiac catheterization was offered, he did not want to do it.  Repeated echocardiogram showed normalization of left ventricle ejection fraction.  Also recently he was diagnosed with laryngeal cancer.  This is a squamous cell.  Likely his path did not show any metastasis.  He is scheduled to talk to oncologist today about therapy I suspect radiation will be recommended.  Except for hoarse voice and some difficulty swallowing meaning pain he does not have any problems he left guardian he got very busy gardening season doing very well from that point review.  No chest pain tightness squeezing pressure burning chest no shortness of breath no swelling of lower extremities.  Past Medical History:  Diagnosis Date  . Abnormal stress test minimal ischemia involving apex 05/01/2019  . Dilated cardiomyopathy ([place])   . Essential hypertension 12/18/2015  . History of DVT (deep vein thrombosis) 04/22/2017  . History of pulmonary embolism 12/18/2015  . History of pulmonary embolus (PE)   . PC (prostate cancer) ([place]) 08/28/2019    Past Surgical History:  Procedure Laterality Date  . INSERTION PROSTATE RADIATION SEED  2003  . PROSTATE SURGERY    . Venacava Filter      Current Medications: Current Meds  Medication Sig  . bicalutamide (CASODEX)  50 MG tablet Take 50 mg by mouth daily.  . carvedilol (COREG) 12.5 MG tablet Take 1 tablet (12.5 mg total) by mouth 2 (two) times daily.  . DENOSUMAB [place] Inject into the skin every 6 (six) months.  . famotidine (PEPCID) 40 MG tablet Take 1 tablet (40 mg total) by mouth at bedtime.  Marland Kitchen ipratropium (ATROVENT) 0.06 % nasal spray Can use two sprays in each nostril every six hours if needed to dry up runny nose.  Marland Kitchen Leuprolide Acetate (LUPRON IJ) Inject as directed every 6 (six) months.  Marland Kitchen omeprazole (PRILOSEC) 40 MG capsule Take 1 capsule (40 mg total) by mouth in the morning.  . sacubitril-valsartan (ENTRESTO) 49-51 MG Take 1 tablet by mouth 2 (two) times daily.  Marland Kitchen spironolactone (ALDACTONE) 25 MG tablet Take 0.5 tablets (12.5 mg total) by mouth daily.  Alveda Reasons 20 MG TABS tablet TAKE ONE TABLET BY MOUTH EVERY DAY WITH SUPPER     Allergies:   Patient has no known allergies.   Social History   Socioeconomic History  . Marital status: Married    Spouse name: Not on file  . Number of children: Not on file  . Years of education: Not on file  . Highest education level: Not on file  Occupational History  . Not on file  Tobacco Use  . Smoking status: Never Smoker  . Smokeless tobacco: Never Used  Vaping Use  . Vaping Use: Never used  Substance and Sexual Activity  . Alcohol use: Yes    Alcohol/week: 14.0 standard drinks  Types: 10 Cans of beer, 1 Shots of liquor, 3 Glasses of wine per week  . Drug use: No  . Sexual activity: Not on file  Other Topics Concern  . Not on file  Social History Narrative  . Not on file   Social Determinants of Health   Financial Resource Strain:   . Difficulty of Paying Living Expenses: Not on file  Food Insecurity:   . Worried About Charity fundraiser in the Last Year: Not on file  . Ran Out of Food in the Last Year: Not on file  Transportation Needs:   . Lack of Transportation (Medical): Not on file  . Lack of Transportation (Non-Medical): Not on  file  Physical Activity:   . Days of Exercise per Week: Not on file  . Minutes of Exercise per Session: Not on file  Stress:   . Feeling of Stress : Not on file  Social Connections:   . Frequency of Communication with Friends and Family: Not on file  . Frequency of Social Gatherings with Friends and Family: Not on file  . Attends Religious Services: Not on file  . Active Member of Clubs or Organizations: Not on file  . Attends Archivist Meetings: Not on file  . Marital Status: Not on file     Family History: The patient's family history includes Heart failure in his mother; Pleurisy in his mother. ROS:   Please see the history of present illness.    All 14 point review of systems negative except as described per history of present illness  EKGs/Labs/Other Studies Reviewed:      Recent Labs: 07/05/2019: BUN 11; Creatinine, Ser 0.97; Potassium 4.7; Sodium 139  Recent Lipid Panel No results found for: CHOL, TRIG, HDL, CHOLHDL, VLDL, LDLCALC, LDLDIRECT  Physical Exam:    VS:  BP 110/64 (BP Location: Right Arm, Patient Position: Sitting, Cuff Size: Normal)   Pulse (!) 58   Ht 5' 10.5" (1.791 m)   Wt 241 lb (109.3 kg)   SpO2 99%   BMI 34.09 kg/m     Wt Readings from Last 3 Encounters:  02/25/20 241 lb (109.3 kg)  11/08/19 238 lb (108 kg)  09/18/19 239 lb 3.2 oz (108.5 kg)     GEN:  Well nourished, well developed in no acute distress HEENT: Normal NECK: No JVD; No carotid bruits LYMPHATICS: No lymphadenopathy CARDIAC: RRR, no murmurs, no rubs, no gallops RESPIRATORY:  Clear to auscultation without rales, wheezing or rhonchi  ABDOMEN: Soft, non-tender, non-distended MUSCULOSKELETAL:  No edema; No deformity  SKIN: Warm and dry LOWER EXTREMITIES: no swelling NEUROLOGIC:  Alert and oriented x 3 PSYCHIATRIC:  Normal affect   ASSESSMENT:    1. Dilated cardiomyopathy ([place])   2. Abnormal stress test minimal ischemia involving apex   3. History of pulmonary  embolism   4. Essential hypertension   5. Laryngeal cancer ([place]) squamous cell    PLAN:    In order of problems listed above:  1. Dilated cardiomyopathy with echocardiogram reviewed showed normalization of left ventricle ejection fraction.  Left atrium was mildly enlarged.  Ejection fraction 55 to 60%.  We will continue present management which include Entresto, Coreg, Aldactone.  I did review Chem-7 which was done just 2 weeks ago in the hospital which show potassium 4.2 and normal creatinine.  We will continue present management. 2. Abnormal stress test involving apex.  Patient completely asymptomatic does not want to do anything about it.  He is doing  well again if his ejection fraction deteriorated then will push harder for cardiac catheterization. 3. History of pulmonary emboli: He is on anticoagulation which I will continue. 4. Essential hypertension blood pressure well controlled continue present management. 5. aryngeal cancer: I did review note by Dr. Thayne Sink his oncologist.  This is a squamous cell, likely his PET scan did not show any metastasis.  I suspect radiation will be recommended.  Today he does have appointment to discuss that  Medication Adjustments/Labs and Tests Ordered: Current medicines are reviewed at length with the patient today.  Concerns regarding medicines are outlined above.  No orders of the defined types were placed in this encounter.  Medication changes: No orders of the defined types were placed in this encounter.   Signed, Park Liter, MD, [facility] 02/25/2020 8:33 AM    [place]

## 2020-02-25 NOTE — Patient Instructions (Signed)

## 2020-02-27 ENCOUNTER — Telehealth: Payer: Self-pay | Admitting: Oncology

## 2020-02-27 NOTE — Telephone Encounter (Signed)
Made no changes to patient per 11/8 LOS

## 2020-03-03 DIAGNOSIS — Z510 Encounter for antineoplastic radiation therapy: Secondary | ICD-10-CM | POA: Diagnosis not present

## 2020-03-03 DIAGNOSIS — C320 Malignant neoplasm of glottis: Secondary | ICD-10-CM | POA: Diagnosis not present

## 2020-03-04 DIAGNOSIS — C320 Malignant neoplasm of glottis: Secondary | ICD-10-CM | POA: Diagnosis not present

## 2020-03-04 DIAGNOSIS — Z23 Encounter for immunization: Secondary | ICD-10-CM | POA: Diagnosis not present

## 2020-03-04 DIAGNOSIS — Z510 Encounter for antineoplastic radiation therapy: Secondary | ICD-10-CM | POA: Diagnosis not present

## 2020-03-06 DIAGNOSIS — Z510 Encounter for antineoplastic radiation therapy: Secondary | ICD-10-CM | POA: Diagnosis not present

## 2020-03-06 DIAGNOSIS — C320 Malignant neoplasm of glottis: Secondary | ICD-10-CM | POA: Diagnosis not present

## 2020-03-07 DIAGNOSIS — C320 Malignant neoplasm of glottis: Secondary | ICD-10-CM | POA: Diagnosis not present

## 2020-03-07 DIAGNOSIS — Z510 Encounter for antineoplastic radiation therapy: Secondary | ICD-10-CM | POA: Diagnosis not present

## 2020-03-10 DIAGNOSIS — Z510 Encounter for antineoplastic radiation therapy: Secondary | ICD-10-CM | POA: Diagnosis not present

## 2020-03-10 DIAGNOSIS — C320 Malignant neoplasm of glottis: Secondary | ICD-10-CM | POA: Diagnosis not present

## 2020-03-11 DIAGNOSIS — C320 Malignant neoplasm of glottis: Secondary | ICD-10-CM | POA: Diagnosis not present

## 2020-03-11 DIAGNOSIS — Z510 Encounter for antineoplastic radiation therapy: Secondary | ICD-10-CM | POA: Diagnosis not present

## 2020-03-12 DIAGNOSIS — Z510 Encounter for antineoplastic radiation therapy: Secondary | ICD-10-CM | POA: Diagnosis not present

## 2020-03-12 DIAGNOSIS — C320 Malignant neoplasm of glottis: Secondary | ICD-10-CM | POA: Diagnosis not present

## 2020-03-17 DIAGNOSIS — Z510 Encounter for antineoplastic radiation therapy: Secondary | ICD-10-CM | POA: Diagnosis not present

## 2020-03-17 DIAGNOSIS — C320 Malignant neoplasm of glottis: Secondary | ICD-10-CM | POA: Diagnosis not present

## 2020-03-18 DIAGNOSIS — Z510 Encounter for antineoplastic radiation therapy: Secondary | ICD-10-CM | POA: Diagnosis not present

## 2020-03-18 DIAGNOSIS — C320 Malignant neoplasm of glottis: Secondary | ICD-10-CM | POA: Diagnosis not present

## 2020-03-19 DIAGNOSIS — C320 Malignant neoplasm of glottis: Secondary | ICD-10-CM | POA: Diagnosis not present

## 2020-03-19 DIAGNOSIS — Z510 Encounter for antineoplastic radiation therapy: Secondary | ICD-10-CM | POA: Diagnosis not present

## 2020-03-20 DIAGNOSIS — C320 Malignant neoplasm of glottis: Secondary | ICD-10-CM | POA: Diagnosis not present

## 2020-03-20 DIAGNOSIS — Z510 Encounter for antineoplastic radiation therapy: Secondary | ICD-10-CM | POA: Diagnosis not present

## 2020-03-21 DIAGNOSIS — Z510 Encounter for antineoplastic radiation therapy: Secondary | ICD-10-CM | POA: Diagnosis not present

## 2020-03-21 DIAGNOSIS — C320 Malignant neoplasm of glottis: Secondary | ICD-10-CM | POA: Diagnosis not present

## 2020-03-24 DIAGNOSIS — C320 Malignant neoplasm of glottis: Secondary | ICD-10-CM | POA: Diagnosis not present

## 2020-03-24 DIAGNOSIS — Z510 Encounter for antineoplastic radiation therapy: Secondary | ICD-10-CM | POA: Diagnosis not present

## 2020-03-25 DIAGNOSIS — C320 Malignant neoplasm of glottis: Secondary | ICD-10-CM | POA: Diagnosis not present

## 2020-03-25 DIAGNOSIS — Z510 Encounter for antineoplastic radiation therapy: Secondary | ICD-10-CM | POA: Diagnosis not present

## 2020-03-26 DIAGNOSIS — Z510 Encounter for antineoplastic radiation therapy: Secondary | ICD-10-CM | POA: Diagnosis not present

## 2020-03-26 DIAGNOSIS — C320 Malignant neoplasm of glottis: Secondary | ICD-10-CM | POA: Diagnosis not present

## 2020-03-27 DIAGNOSIS — C320 Malignant neoplasm of glottis: Secondary | ICD-10-CM | POA: Diagnosis not present

## 2020-03-27 DIAGNOSIS — Z510 Encounter for antineoplastic radiation therapy: Secondary | ICD-10-CM | POA: Diagnosis not present

## 2020-03-28 DIAGNOSIS — Z510 Encounter for antineoplastic radiation therapy: Secondary | ICD-10-CM | POA: Diagnosis not present

## 2020-03-28 DIAGNOSIS — C320 Malignant neoplasm of glottis: Secondary | ICD-10-CM | POA: Diagnosis not present

## 2020-03-31 DIAGNOSIS — C320 Malignant neoplasm of glottis: Secondary | ICD-10-CM | POA: Diagnosis not present

## 2020-03-31 DIAGNOSIS — Z510 Encounter for antineoplastic radiation therapy: Secondary | ICD-10-CM | POA: Diagnosis not present

## 2020-04-01 DIAGNOSIS — Z510 Encounter for antineoplastic radiation therapy: Secondary | ICD-10-CM | POA: Diagnosis not present

## 2020-04-01 DIAGNOSIS — C320 Malignant neoplasm of glottis: Secondary | ICD-10-CM | POA: Diagnosis not present

## 2020-04-02 DIAGNOSIS — C320 Malignant neoplasm of glottis: Secondary | ICD-10-CM | POA: Diagnosis not present

## 2020-04-02 DIAGNOSIS — Z510 Encounter for antineoplastic radiation therapy: Secondary | ICD-10-CM | POA: Diagnosis not present

## 2020-04-03 ENCOUNTER — Other Ambulatory Visit: Payer: Self-pay | Admitting: Cardiology

## 2020-04-04 DIAGNOSIS — C320 Malignant neoplasm of glottis: Secondary | ICD-10-CM | POA: Diagnosis not present

## 2020-04-04 DIAGNOSIS — Z510 Encounter for antineoplastic radiation therapy: Secondary | ICD-10-CM | POA: Diagnosis not present

## 2020-04-07 DIAGNOSIS — Z510 Encounter for antineoplastic radiation therapy: Secondary | ICD-10-CM | POA: Diagnosis not present

## 2020-04-07 DIAGNOSIS — C320 Malignant neoplasm of glottis: Secondary | ICD-10-CM | POA: Diagnosis not present

## 2020-04-08 DIAGNOSIS — C320 Malignant neoplasm of glottis: Secondary | ICD-10-CM | POA: Diagnosis not present

## 2020-04-08 DIAGNOSIS — Z510 Encounter for antineoplastic radiation therapy: Secondary | ICD-10-CM | POA: Diagnosis not present

## 2020-04-09 DIAGNOSIS — Z510 Encounter for antineoplastic radiation therapy: Secondary | ICD-10-CM | POA: Diagnosis not present

## 2020-04-09 DIAGNOSIS — C320 Malignant neoplasm of glottis: Secondary | ICD-10-CM | POA: Diagnosis not present

## 2020-04-10 DIAGNOSIS — Z510 Encounter for antineoplastic radiation therapy: Secondary | ICD-10-CM | POA: Diagnosis not present

## 2020-04-10 DIAGNOSIS — C320 Malignant neoplasm of glottis: Secondary | ICD-10-CM | POA: Diagnosis not present

## 2020-04-14 DIAGNOSIS — C320 Malignant neoplasm of glottis: Secondary | ICD-10-CM | POA: Diagnosis not present

## 2020-04-14 DIAGNOSIS — Z510 Encounter for antineoplastic radiation therapy: Secondary | ICD-10-CM | POA: Diagnosis not present

## 2020-04-15 DIAGNOSIS — Z510 Encounter for antineoplastic radiation therapy: Secondary | ICD-10-CM | POA: Diagnosis not present

## 2020-04-15 DIAGNOSIS — C320 Malignant neoplasm of glottis: Secondary | ICD-10-CM | POA: Diagnosis not present

## 2020-04-16 DIAGNOSIS — Z510 Encounter for antineoplastic radiation therapy: Secondary | ICD-10-CM | POA: Diagnosis not present

## 2020-04-16 DIAGNOSIS — C320 Malignant neoplasm of glottis: Secondary | ICD-10-CM | POA: Diagnosis not present

## 2020-04-29 ENCOUNTER — Other Ambulatory Visit: Payer: Self-pay | Admitting: Allergy and Immunology

## 2020-05-07 ENCOUNTER — Encounter: Payer: Self-pay | Admitting: Allergy and Immunology

## 2020-05-07 ENCOUNTER — Ambulatory Visit (INDEPENDENT_AMBULATORY_CARE_PROVIDER_SITE_OTHER): Payer: Medicare Other | Admitting: Allergy and Immunology

## 2020-05-07 ENCOUNTER — Other Ambulatory Visit: Payer: Self-pay

## 2020-05-07 VITALS — BP 130/82 | HR 75 | Resp 16

## 2020-05-07 DIAGNOSIS — J310 Chronic rhinitis: Secondary | ICD-10-CM | POA: Diagnosis not present

## 2020-05-07 DIAGNOSIS — K219 Gastro-esophageal reflux disease without esophagitis: Secondary | ICD-10-CM

## 2020-05-07 NOTE — Patient Instructions (Addendum)
  1.  Continue to treat reflux / LPR:   A. Omeprazole 40 mg in AM  B. Famotidine 40 mg in PM  C. Decrease caffeine and alcohol consumption  2.  Continue to use nasal ipratropium 0.06% - 2 sprays each nostril every 6 hours to dry up nose. Can use prior to eating.  3. Return to clinic in 1 year or earlier if problem

## 2020-05-07 NOTE — Progress Notes (Signed)
[place] [facility] [place] [facility] [place] - [place] - [place]   Follow-up Note  Referring Provider: Marylen Ponto, MD Primary Provider: Marylen Ponto, MD Date of Office Visit: 05/07/2020  Subjective:   Allen Macdonald (DOB: 09/17/1950) is a 70 y.o. male who [facility] in re-evaluation of the following:  HPI:   Allen Macdonald returns to this clinic in reevaluation of LPR and nonallergic rhinitis and laryngeal cancer.  His last visit to this clinic was 17 December 2019.  Overall he feels pretty good.  He still has a very raspy voice.  He has undergone his radiation therapy for his isolated laryngeal cancer without metastasis or regional lymph node involvement.   He continues to treat his reflux with a combination of a proton pump inhibitor and H2 receptor blocker and has decreased his caffeine consumption to 2 caffeinated drinks per day.    He continues to use nasal ipratropium which is working quite well for his nonallergic rhinitis.  He has received 3 Moderna COVID-vaccine and a flu vaccine this year.  Allergies as of 05/07/2020   No Known Allergies     Medication List      bicalutamide 50 MG tablet Commonly known as: CASODEX Take 50 mg by mouth daily.   carvedilol 12.5 MG tablet Commonly known as: COREG Take 1 tablet (12.5 mg total) by mouth 2 (two) times daily.   DENOSUMAB [place] Inject into the skin every 6 (six) months.   Entresto 49-51 MG Generic drug: sacubitril-valsartan Take 1 tablet by mouth 2 (two) times daily.   famotidine 40 MG tablet Commonly known as: PEPCID Take 1 tablet (40 mg total) by mouth at bedtime.   ipratropium 0.06 % nasal spray Commonly known as: ATROVENT Can use two sprays in each nostril every six hours if needed to dry up runny nose.   LUPRON IJ Inject as directed every 6 (six) months.   omeprazole 40 MG capsule Commonly known as: PRILOSEC TAKE 1 CAPSULE BY MOUTH EVERY MORNING   spironolactone 25 MG  tablet Commonly known as: ALDACTONE Take 0.5 tablets (12.5 mg total) by mouth daily.   Xarelto 20 MG Tabs tablet Generic drug: rivaroxaban TAKE ONE TABLET BY MOUTH EVERY DAY WITH SUPPER       Past Medical History:  Diagnosis Date  . Abnormal stress test minimal ischemia involving apex 05/01/2019  . Dilated cardiomyopathy ([facility])   . Essential hypertension 12/18/2015  . History of DVT (deep vein thrombosis) 04/22/2017  . History of pulmonary embolism 12/18/2015  . History of pulmonary embolus (PE)   . PC (prostate cancer) ([facility]) 08/28/2019    Past Surgical History:  Procedure Laterality Date  . INSERTION PROSTATE RADIATION SEED  2003  . PROSTATE SURGERY    . Venacava Filter      Review of systems negative except as noted in HPI / PMHx or noted below:  Review of Systems  Constitutional: Negative.   HENT: Negative.   Eyes: Negative.   Respiratory: Negative.   Cardiovascular: Negative.   Gastrointestinal: Negative.   Genitourinary: Negative.   Musculoskeletal: Negative.   Skin: Negative.   Neurological: Negative.   Endo/Heme/Allergies: Negative.   Psychiatric/Behavioral: Negative.      Objective:   Vitals:   05/07/20 0949  BP: 130/82  Pulse: 75  Resp: 16  SpO2: 98%          Physical Exam Constitutional:      Appearance: He is not diaphoretic.  Comments: Raspy voice  HENT:     Head: Normocephalic.     Right Ear: Tympanic membrane, ear canal and external ear normal.     Left Ear: Tympanic membrane, ear canal and external ear normal.     Nose: Nose normal. No mucosal edema or rhinorrhea.     Mouth/Throat:     Mouth: Oropharynx is clear and moist and mucous membranes are normal.     Pharynx: Uvula midline. No oropharyngeal exudate.  Eyes:     Conjunctiva/sclera: Conjunctivae normal.  Neck:     Thyroid: No thyromegaly.     Trachea: Trachea normal. No tracheal tenderness or tracheal deviation.  Cardiovascular:     Rate and Rhythm: Normal rate and regular  rhythm.     Heart sounds: Normal heart sounds, S1 normal and S2 normal. No murmur heard.   Pulmonary:     Effort: No respiratory distress.     Breath sounds: Normal breath sounds. No stridor. No wheezing or rales.  Musculoskeletal:        General: No edema.  Lymphadenopathy:     Head:     Right side of head: No tonsillar adenopathy.     Left side of head: No tonsillar adenopathy.     Cervical: No cervical adenopathy.  Skin:    Findings: No erythema or rash.     Nails: There is no clubbing.  Neurological:     Mental Status: He is alert.     Diagnostics: none  Assessment and Plan:   1. LPRD (laryngopharyngeal reflux disease)   2. Nonallergic rhinitis    1.  Continue to treat reflux / LPR:   A. Omeprazole 40 mg in AM  B. Famotidine 40 mg in PM  C. Decrease caffeine and alcohol consumption  2.  Continue to use nasal ipratropium 0.06% - 2 sprays each nostril every 6 hours to dry up nose. Can use prior to eating.  3. Return to clinic in 1 year or earlier if problem  Allen Macdonald is doing quite well on his current plan and I see no need for changing this plan for all of 2022.  He will follow-up with his radiation oncologist and ENT doctor regarding future evaluation of his isolated/limited laryngeal cancer.  I will see him back in this clinic in 1 year or earlier if there is a problem.  Laurette Schimke, MD Allergy / Immunology [facility]

## 2020-05-08 ENCOUNTER — Encounter: Payer: Self-pay | Admitting: Allergy and Immunology

## 2020-05-12 ENCOUNTER — Other Ambulatory Visit: Payer: Self-pay | Admitting: Allergy and Immunology

## 2020-07-17 DIAGNOSIS — C320 Malignant neoplasm of glottis: Secondary | ICD-10-CM | POA: Diagnosis not present

## 2020-07-17 DIAGNOSIS — Z08 Encounter for follow-up examination after completed treatment for malignant neoplasm: Secondary | ICD-10-CM | POA: Diagnosis not present

## 2020-07-22 DIAGNOSIS — H52223 Regular astigmatism, bilateral: Secondary | ICD-10-CM | POA: Diagnosis not present

## 2020-07-22 DIAGNOSIS — H43813 Vitreous degeneration, bilateral: Secondary | ICD-10-CM | POA: Diagnosis not present

## 2020-07-22 DIAGNOSIS — H524 Presbyopia: Secondary | ICD-10-CM | POA: Diagnosis not present

## 2020-07-25 ENCOUNTER — Other Ambulatory Visit: Payer: Self-pay | Admitting: Cardiology

## 2020-07-25 NOTE — Telephone Encounter (Signed)
Entresto approved and filled

## 2020-08-26 DIAGNOSIS — Z923 Personal history of irradiation: Secondary | ICD-10-CM | POA: Diagnosis not present

## 2020-08-26 DIAGNOSIS — J384 Edema of larynx: Secondary | ICD-10-CM | POA: Diagnosis not present

## 2020-08-26 DIAGNOSIS — J342 Deviated nasal septum: Secondary | ICD-10-CM | POA: Diagnosis not present

## 2020-08-26 DIAGNOSIS — J343 Hypertrophy of nasal turbinates: Secondary | ICD-10-CM | POA: Diagnosis not present

## 2020-08-26 DIAGNOSIS — Z8521 Personal history of malignant neoplasm of larynx: Secondary | ICD-10-CM | POA: Diagnosis not present

## 2020-08-26 DIAGNOSIS — R490 Dysphonia: Secondary | ICD-10-CM | POA: Diagnosis not present

## 2020-09-01 ENCOUNTER — Ambulatory Visit: Payer: Medicare Other | Admitting: Cardiology

## 2020-09-22 ENCOUNTER — Other Ambulatory Visit: Payer: Self-pay

## 2020-09-24 DIAGNOSIS — M810 Age-related osteoporosis without current pathological fracture: Secondary | ICD-10-CM | POA: Diagnosis not present

## 2020-10-06 ENCOUNTER — Ambulatory Visit: Payer: Medicare Other | Admitting: Cardiology

## 2020-10-06 ENCOUNTER — Encounter: Payer: Self-pay | Admitting: Cardiology

## 2020-10-06 ENCOUNTER — Other Ambulatory Visit: Payer: Self-pay

## 2020-10-06 VITALS — BP 130/74 | HR 49 | Ht 70.5 in | Wt 237.0 lb

## 2020-10-06 DIAGNOSIS — I10 Essential (primary) hypertension: Secondary | ICD-10-CM | POA: Diagnosis not present

## 2020-10-06 DIAGNOSIS — I420 Dilated cardiomyopathy: Secondary | ICD-10-CM

## 2020-10-06 DIAGNOSIS — Z86711 Personal history of pulmonary embolism: Secondary | ICD-10-CM | POA: Diagnosis not present

## 2020-10-06 NOTE — Progress Notes (Signed)
Cardiology Office Note:    Date:  10/06/2020   ID:  Allen Macdonald, DOB [birth year], MRN [id number]  PCP:  Ronita Hipps, MD  Cardiologist:  Jenne Campus, MD    Referring MD: Ronita Hipps, MD   Chief Complaint  Patient presents with   Follow-up  Am doing very well  History of Present Illness:    Allen Macdonald is a 70 y.o. male with past medical history significant for cardiomyopathy ejection fraction 35% but then with appropriate medication his ejection fraction normalized.  In the process of investigation of etiology of this phenomenon he did have a stress test which showed minimal area of ischemia involving apex, however he did not want to have a cardiac catheterization.  Also recently he was discovered to have laryngeal cancer.  That being managed with radiation.  He seems to be doing well from that point review.  Luckily he did not have any metastasis. He comes today to my office for follow-up overall doing well.  Denies have any chest pain, tightness, pressure, burning in the chest.  He was noted to be bradycardic but asymptomatic.  There is no dizziness passing out.  Overall seems to be doing well.  Past Medical History:  Diagnosis Date   Abnormal stress test minimal ischemia involving apex 05/01/2019   Dilated cardiomyopathy ([facility])    Essential hypertension 12/18/2015   History of DVT (deep vein thrombosis) 04/22/2017   History of pulmonary embolism 12/18/2015   History of pulmonary embolus (PE)    Laryngeal cancer (H[facility]) squamous cell 02/25/2020   PC (prostate cancer) ([place]) 08/28/2019    Past Surgical History:  Procedure Laterality Date   INSERTION PROSTATE RADIATION SEED  2003   PROSTATE SURGERY     Venacava Filter      Current Medications: Current Meds  Medication Sig   bicalutamide (CASODEX) 50 MG tablet Take 50 mg by mouth daily.   carvedilol (COREG) 12.5 MG tablet Take 1 tablet (12.5 mg total) by mouth 2 (two) times daily.   DENOSUMAB [place] Inject into the skin  every 6 (six) months.   ENTRESTO 49-51 MG TAKE ONE TABLET BY MOUTH TWICE DAILY   Leuprolide Acetate (LUPRON IJ) Inject as directed every 6 (six) months.   lisinopril (ZESTRIL) 20 MG tablet Take 1 tablet by mouth daily.   omeprazole (PRILOSEC) 40 MG capsule TAKE 1 CAPSULE BY MOUTH EVERY MORNING   spironolactone (ALDACTONE) 25 MG tablet Take 0.5 tablets (12.5 mg total) by mouth daily.   XARELTO 20 MG TABS tablet TAKE ONE TABLET BY MOUTH EVERY DAY WITH SUPPER     Allergies:   Patient has no known allergies.   Social History   Socioeconomic History   Marital status: Married    Spouse name: Not on file   Number of children: Not on file   Years of education: Not on file   Highest education level: Not on file  Occupational History   Not on file  Tobacco Use   Smoking status: Never   Smokeless tobacco: Never  Vaping Use   Vaping Use: Never used  Substance and Sexual Activity   Alcohol use: Yes    Alcohol/week: 14.0 standard drinks    Types: 10 Cans of beer, 1 Shots of liquor, 3 Glasses of wine per week   Drug use: No   Sexual activity: Not on file  Other Topics Concern   Not on file  Social History Narrative   Not on file   Social Determinants of  Health   Financial Resource Strain: Not on file  Food Insecurity: Not on file  Transportation Needs: Not on file  Physical Activity: Not on file  Stress: Not on file  Social Connections: Not on file     Family History: The patient's family history includes Heart failure in his mother; Pleurisy in his mother. ROS:   Please see the history of present illness.    All 14 point review of systems negative except as described per history of present illness  EKGs/Labs/Other Studies Reviewed:      Recent Labs: No results found for requested labs within last 8760 hours.  Recent Lipid Panel No results found for: CHOL, TRIG, HDL, CHOLHDL, VLDL, LDLCALC, LDLDIRECT  Physical Exam:    VS:  BP 130/74 (BP Location: Right Arm, Patient  Position: Sitting, Cuff Size: Normal)   Pulse (!) 49   Ht 5' 10.5" (1.791 m)   Wt 237 lb (107.5 kg)   SpO2 98%   BMI 33.53 kg/m     Wt Readings from Last 3 Encounters:  10/06/20 237 lb (107.5 kg)  02/25/20 240 lb 4.8 oz (109 kg)  02/25/20 241 lb (109.3 kg)     GEN:  Well nourished, well developed in no acute distress HEENT: Normal NECK: No JVD; No carotid bruits LYMPHATICS: No lymphadenopathy CARDIAC: RRR, no murmurs, no rubs, no gallops RESPIRATORY:  Clear to auscultation without rales, wheezing or rhonchi  ABDOMEN: Soft, non-tender, non-distended MUSCULOSKELETAL:  No edema; No deformity  SKIN: Warm and dry LOWER EXTREMITIES: no swelling NEUROLOGIC:  Alert and oriented x 3 PSYCHIATRIC:  Normal affect   ASSESSMENT:    1. Essential hypertension   2. Dilated cardiomyopathy ([place])   3. History of pulmonary embolus (PE)    PLAN:    In order of problems listed above:  Essential hypertension blood pressure well controlled continue present management Dilated cardiomyopathy we will repeat his echocardiogram to check left ventricle ejection fraction if ejection fraction is preserved we will cut down his beta-blocker if not we will continue. Sinus bradycardia which is related to beta-blocker but again he is asymptomatic therefore I favor continue. History of pulmonary emboli we will continue with anticoagulation he takes Xarelto 20 which I will continue Dyslipidemia I do not have any recent fasting lipid profile he is scheduled to see his primary care physician tomorrow and he will have cholesterol checked and   Medication Adjustments/Labs and Tests Ordered: Current medicines are reviewed at length with the patient today.  Concerns regarding medicines are outlined above.  No orders of the defined types were placed in this encounter.  Medication changes: No orders of the defined types were placed in this encounter.   Signed, Park Liter, MD, [facility] 10/06/2020 12:02 PM     [name]

## 2020-10-06 NOTE — Patient Instructions (Signed)
Medication Instructions:  Your physician recommends that you continue on your current medications as directed. Please refer to the Current Medication list given to you today.  *If you need a refill on your cardiac medications before your next appointment, please call your pharmacy*   Lab Work: None If you have labs (blood work) drawn today and your tests are completely normal, you will receive your results only by: [place] (if you have MyChart) OR A paper copy in the mail If you have any lab test that is abnormal or we need to change your treatment, we will call you to review the results.   Testing/Procedures: Your physician has requested that you have an echocardiogram. Echocardiography is a painless test that uses sound waves to create images of your heart. It provides your doctor with information about the size and shape of your heart and how well your heart's chambers and valves are working. This procedure takes approximately one hour. There are no restrictions for this procedure.    Follow-Up: At [facility], you and your health needs are our priority.  As part of our continuing mission to provide you with exceptional heart care, we have created designated Provider Care Teams.  These Care Teams include your primary Cardiologist (physician) and Advanced Practice Providers (APPs -  Physician Assistants and Nurse Practitioners) who all work together to provide you with the care you need, when you need it.  We recommend signing up for the patient portal called "MyChart".  Sign up information is provided on this After Visit Summary.  MyChart is used to connect with patients for Virtual Visits (Telemedicine).  Patients are able to view lab/test results, encounter notes, upcoming appointments, etc.  Non-urgent messages can be sent to your provider as well.   To learn more about what you can do with MyChart, go to [email or web address].    Your next appointment:   6  month(s)  The format for your next appointment:   In Person  Provider:   Jenne Campus, MD   Other Instructions  Echocardiogram An echocardiogram is a test that uses sound waves (ultrasound) to produce images of the heart. Images from an echocardiogram can provide important information about: Heart size and shape. The size and thickness and movement of your heart's walls. Heart muscle function and strength. Heart valve function or if you have stenosis. Stenosis is when the heart valves are too narrow. If blood is flowing backward through the heart valves (regurgitation). A tumor or infectious growth around the heart valves. Areas of heart muscle that are not working well because of poor blood flow or injury from a heart attack. Aneurysm detection. An aneurysm is a weak or damaged part of an artery wall. The wall bulges out from the normal force of blood pumping through the body. Tell a health care provider about: Any allergies you have. All medicines you are taking, including vitamins, herbs, eye drops, creams, and over-the-counter medicines. Any blood disorders you have. Any surgeries you have had. Any medical conditions you have. Whether you are pregnant or may be pregnant. What are the risks? Generally, this is a safe test. However, problems may occur, including an allergic reaction to dye (contrast) that may be used during the test. What happens before the test? No specific preparation is needed. You may eat and drink normally. What happens during the test?  You will take off your clothes from the waist up and put on a hospital gown. Electrodes or electrocardiogram (ECG)patches may be placed  on your chest. The electrodes or patches are then connected to a device that monitors your heart rate and rhythm. You will lie down on a table for an ultrasound exam. A gel will be applied to your chest to help sound waves pass through your skin. A handheld device, called a  transducer, will be pressed against your chest and moved over your heart. The transducer produces sound waves that travel to your heart and bounce back (or "echo" back) to the transducer. These sound waves will be captured in real-time and changed into images of your heart that can be viewed on a video monitor. The images will be recorded on a computer and reviewed by your health care provider. You may be asked to change positions or hold your breath for a short time. This makes it easier to get different views or better views of your heart. In some cases, you may receive contrast through an IV in one of your veins. This can improve the quality of the pictures from your heart. The procedure may vary among health care providers and hospitals. What can I expect after the test? You may return to your normal, everyday life, including diet, activities, andmedicines, unless your health care provider tells you not to do that. Follow these instructions at home: It is up to you to get the results of your test. Ask your health care provider, or the department that is doing the test, when your results will be ready. Keep all follow-up visits. This is important. Summary An echocardiogram is a test that uses sound waves (ultrasound) to produce images of the heart. Images from an echocardiogram can provide important information about the size and shape of your heart, heart muscle function, heart valve function, and other possible heart problems. You do not need to do anything to prepare before this test. You may eat and drink normally. After the echocardiogram is completed, you may return to your normal, everyday life, unless your health care provider tells you not to do that. This information is not intended to replace advice given to you by your health care provider. Make sure you discuss any questions you have with your healthcare provider. Document Revised: 11/27/2019 Document Reviewed: 11/27/2019 Elsevier  Patient Education  2022 [employer].

## 2020-10-07 ENCOUNTER — Other Ambulatory Visit: Payer: Self-pay | Admitting: Cardiology

## 2020-10-07 DIAGNOSIS — I420 Dilated cardiomyopathy: Secondary | ICD-10-CM | POA: Diagnosis not present

## 2020-10-07 DIAGNOSIS — Z0000 Encounter for general adult medical examination without abnormal findings: Secondary | ICD-10-CM | POA: Diagnosis not present

## 2020-10-07 DIAGNOSIS — Z79899 Other long term (current) drug therapy: Secondary | ICD-10-CM | POA: Diagnosis not present

## 2020-10-07 DIAGNOSIS — I10 Essential (primary) hypertension: Secondary | ICD-10-CM | POA: Diagnosis not present

## 2020-10-10 ENCOUNTER — Ambulatory Visit (INDEPENDENT_AMBULATORY_CARE_PROVIDER_SITE_OTHER): Payer: Medicare Other

## 2020-10-10 ENCOUNTER — Other Ambulatory Visit: Payer: Self-pay

## 2020-10-10 DIAGNOSIS — I420 Dilated cardiomyopathy: Secondary | ICD-10-CM | POA: Diagnosis not present

## 2020-10-10 DIAGNOSIS — I10 Essential (primary) hypertension: Secondary | ICD-10-CM

## 2020-10-10 DIAGNOSIS — Z86711 Personal history of pulmonary embolism: Secondary | ICD-10-CM

## 2020-10-10 LAB — ECHOCARDIOGRAM COMPLETE
Area-P 1/2: 2.5 cm2
Calc EF: 59.1 %
S' Lateral: 4.2 cm
Single Plane A2C EF: 64.9 %
Single Plane A4C EF: 54.6 %

## 2020-10-27 ENCOUNTER — Other Ambulatory Visit: Payer: Self-pay | Admitting: Allergy and Immunology

## 2020-10-27 DIAGNOSIS — M79672 Pain in left foot: Secondary | ICD-10-CM | POA: Diagnosis not present

## 2020-11-19 DIAGNOSIS — C320 Malignant neoplasm of glottis: Secondary | ICD-10-CM | POA: Diagnosis not present

## 2020-11-20 DIAGNOSIS — S96912A Strain of unspecified muscle and tendon at ankle and foot level, left foot, initial encounter: Secondary | ICD-10-CM | POA: Diagnosis not present

## 2020-11-20 DIAGNOSIS — M2022 Hallux rigidus, left foot: Secondary | ICD-10-CM | POA: Diagnosis not present

## 2020-11-20 DIAGNOSIS — M722 Plantar fascial fibromatosis: Secondary | ICD-10-CM | POA: Diagnosis not present

## 2020-12-08 ENCOUNTER — Other Ambulatory Visit: Payer: Self-pay | Admitting: Cardiology

## 2020-12-26 DIAGNOSIS — J343 Hypertrophy of nasal turbinates: Secondary | ICD-10-CM | POA: Diagnosis not present

## 2020-12-26 DIAGNOSIS — R070 Pain in throat: Secondary | ICD-10-CM | POA: Diagnosis not present

## 2020-12-26 DIAGNOSIS — R490 Dysphonia: Secondary | ICD-10-CM | POA: Diagnosis not present

## 2020-12-26 DIAGNOSIS — Z8521 Personal history of malignant neoplasm of larynx: Secondary | ICD-10-CM | POA: Diagnosis not present

## 2020-12-26 DIAGNOSIS — J342 Deviated nasal septum: Secondary | ICD-10-CM | POA: Diagnosis not present

## 2020-12-26 DIAGNOSIS — Z923 Personal history of irradiation: Secondary | ICD-10-CM | POA: Diagnosis not present

## 2021-01-02 ENCOUNTER — Telehealth: Payer: Self-pay

## 2021-01-20 DIAGNOSIS — Z23 Encounter for immunization: Secondary | ICD-10-CM | POA: Diagnosis not present

## 2021-01-29 DIAGNOSIS — Z08 Encounter for follow-up examination after completed treatment for malignant neoplasm: Secondary | ICD-10-CM | POA: Diagnosis not present

## 2021-01-29 DIAGNOSIS — C320 Malignant neoplasm of glottis: Secondary | ICD-10-CM | POA: Diagnosis not present

## 2021-03-30 ENCOUNTER — Other Ambulatory Visit: Payer: Self-pay | Admitting: Cardiology

## 2021-03-30 NOTE — Telephone Encounter (Signed)
Xarelto 20 mg refill request received. Pt is 70 years old, weight- 107.5 kg, Crea- 1.1  on 10/07/20, last seen by Dr. Agustin Cree on 10/06/20, Diagnosis-afib, CrCl- 78.45; Dose is appropriate based on dosing criteria. Will send in refill to requested pharmacy.

## 2021-04-22 DIAGNOSIS — C61 Malignant neoplasm of prostate: Secondary | ICD-10-CM | POA: Diagnosis not present

## 2021-04-28 ENCOUNTER — Ambulatory Visit: Payer: Medicare Other | Admitting: Cardiology

## 2021-04-28 DIAGNOSIS — R0981 Nasal congestion: Secondary | ICD-10-CM | POA: Diagnosis not present

## 2021-04-28 DIAGNOSIS — Z8521 Personal history of malignant neoplasm of larynx: Secondary | ICD-10-CM | POA: Diagnosis not present

## 2021-04-28 DIAGNOSIS — J343 Hypertrophy of nasal turbinates: Secondary | ICD-10-CM | POA: Diagnosis not present

## 2021-04-28 DIAGNOSIS — J342 Deviated nasal septum: Secondary | ICD-10-CM | POA: Diagnosis not present

## 2021-04-28 DIAGNOSIS — Z923 Personal history of irradiation: Secondary | ICD-10-CM | POA: Diagnosis not present

## 2021-04-29 ENCOUNTER — Other Ambulatory Visit: Payer: Self-pay

## 2021-04-29 ENCOUNTER — Encounter: Payer: Self-pay | Admitting: Cardiology

## 2021-04-29 ENCOUNTER — Ambulatory Visit: Payer: Medicare HMO | Admitting: Cardiology

## 2021-04-29 VITALS — BP 106/62 | HR 53 | Ht 71.0 in | Wt 242.0 lb

## 2021-04-29 DIAGNOSIS — C61 Malignant neoplasm of prostate: Secondary | ICD-10-CM

## 2021-04-29 DIAGNOSIS — I420 Dilated cardiomyopathy: Secondary | ICD-10-CM | POA: Diagnosis not present

## 2021-04-29 DIAGNOSIS — C329 Malignant neoplasm of larynx, unspecified: Secondary | ICD-10-CM | POA: Diagnosis not present

## 2021-04-29 DIAGNOSIS — R9439 Abnormal result of other cardiovascular function study: Secondary | ICD-10-CM | POA: Diagnosis not present

## 2021-04-29 DIAGNOSIS — Z86718 Personal history of other venous thrombosis and embolism: Secondary | ICD-10-CM | POA: Diagnosis not present

## 2021-04-29 DIAGNOSIS — I10 Essential (primary) hypertension: Secondary | ICD-10-CM | POA: Diagnosis not present

## 2021-04-29 DIAGNOSIS — R350 Frequency of micturition: Secondary | ICD-10-CM | POA: Diagnosis not present

## 2021-04-29 DIAGNOSIS — R351 Nocturia: Secondary | ICD-10-CM | POA: Diagnosis not present

## 2021-04-29 DIAGNOSIS — N400 Enlarged prostate without lower urinary tract symptoms: Secondary | ICD-10-CM | POA: Diagnosis not present

## 2021-04-29 DIAGNOSIS — Z86711 Personal history of pulmonary embolism: Secondary | ICD-10-CM

## 2021-04-29 DIAGNOSIS — Z79818 Long term (current) use of other agents affecting estrogen receptors and estrogen levels: Secondary | ICD-10-CM | POA: Diagnosis not present

## 2021-04-29 DIAGNOSIS — R3916 Straining to void: Secondary | ICD-10-CM | POA: Diagnosis not present

## 2021-04-29 DIAGNOSIS — Z5111 Encounter for antineoplastic chemotherapy: Secondary | ICD-10-CM | POA: Diagnosis not present

## 2021-04-29 NOTE — Addendum Note (Signed)
Addended by: Edwyna Shell I on: 04/29/2021 10:04 AM   Modules accepted: Orders

## 2021-04-29 NOTE — Patient Instructions (Signed)

## 2021-04-29 NOTE — Progress Notes (Signed)
Cardiology Office Note:    Date:  04/29/2021   ID:  Allen Macdonald, DOB [birth year], MRN [id number]  PCP:  Ronita Hipps, MD  Cardiologist:  Jenne Campus, MD    Referring MD: Ronita Hipps, MD   Chief Complaint  Patient presents with   Follow-up  I am doing fine  History of Present Illness:    Allen Macdonald is a 71 y.o. male   with past medical history significant for cardiomyopathy ejection fraction 35% but then with appropriate medication his ejection fraction normalized.  In the process of investigation of etiology of this phenomenon he did have a stress test which showed minimal area of ischemia involving apex, however he did not want to have a cardiac catheterization.  Also recently he was discovered to have laryngeal cancer.  That being managed with radiation.   He comes today to my office for follow-up.  Overall he is doing very well.  He denies have any chest pain tightness squeezing pressure burning chest no palpitations no dizziness no swelling of lower extremities.  He recently seen his doctor for laryngeal cancer everything is stable, today later he is going to see his new urologist.  He is getting injections for his prostate CA but thinks seems to be stable cardiac wise doing well  Past Medical History:  Diagnosis Date   Abnormal stress test minimal ischemia involving apex 05/01/2019   Dilated cardiomyopathy ([place])    Essential hypertension 12/18/2015   History of DVT (deep vein thrombosis) 04/22/2017   History of pulmonary embolism 12/18/2015   History of pulmonary embolus (PE)    Laryngeal cancer ([place]) squamous cell 02/25/2020   PC (prostate cancer) ([place]) 08/28/2019    Past Surgical History:  Procedure Laterality Date   INSERTION PROSTATE RADIATION SEED  2003   PROSTATE SURGERY     Venacava Filter      Current Medications: Current Meds  Medication Sig   bicalutamide (CASODEX) 50 MG tablet Take 50 mg by mouth daily.   carvedilol (COREG) 12.5 MG tablet Take 1  tablet (12.5 mg total) by mouth 2 (two) times daily.   DENOSUMAB [place] Inject 60 mLs into the skin every 6 (six) months.   Leuprolide Acetate (LUPRON IJ) Inject 1 each as directed every 6 (six) months. Unknown strength   lisinopril (ZESTRIL) 20 MG tablet Take 1 tablet by mouth daily.   omeprazole (PRILOSEC) 40 MG capsule Take 40 mg by mouth daily.   rivaroxaban (XARELTO) 20 MG TABS tablet Take 20 mg by mouth daily with supper.   sacubitril-valsartan (ENTRESTO) 49-51 MG Take 1 tablet by mouth 2 (two) times daily.   spironolactone (ALDACTONE) 25 MG tablet Take 12.5 mg by mouth daily.     Allergies:   Patient has no known allergies.   Social History   Socioeconomic History   Marital status: Married    Spouse name: Not on file   Number of children: Not on file   Years of education: Not on file   Highest education level: Not on file  Occupational History   Not on file  Tobacco Use   Smoking status: Never   Smokeless tobacco: Never  Vaping Use   Vaping Use: Never used  Substance and Sexual Activity   Alcohol use: Yes    Alcohol/week: 14.0 standard drinks    Types: 10 Cans of beer, 1 Shots of liquor, 3 Glasses of wine per week   Drug use: No   Sexual activity: Not on file  Other  Topics Concern   Not on file  Social History Narrative   Not on file   Social Determinants of Health   Financial Resource Strain: Not on file  Food Insecurity: Not on file  Transportation Needs: Not on file  Physical Activity: Not on file  Stress: Not on file  Social Connections: Not on file     Family History: The patient's family history includes Heart failure in his mother; Pleurisy in his mother. ROS:   Please see the history of present illness.    All 14 point review of systems negative except as described per history of present illness  EKGs/Labs/Other Studies Reviewed:      Recent Labs: No results found for requested labs within last 8760 hours.  Recent Lipid Panel No results found  for: CHOL, TRIG, HDL, CHOLHDL, VLDL, LDLCALC, LDLDIRECT  Physical Exam:    VS:  BP 106/62 (BP Location: Left Arm, Patient Position: Sitting)    Pulse (!) 53    Ht 5\' 11"  (1.803 m)    Wt 242 lb (109.8 kg)    SpO2 91%    BMI 33.75 kg/m     Wt Readings from Last 3 Encounters:  04/29/21 242 lb (109.8 kg)  10/06/20 237 lb (107.5 kg)  02/25/20 240 lb 4.8 oz (109 kg)     GEN:  Well nourished, well developed in no acute distress HEENT: Normal NECK: No JVD; No carotid bruits LYMPHATICS: No lymphadenopathy CARDIAC: RRR, no murmurs, no rubs, no gallops RESPIRATORY:  Clear to auscultation without rales, wheezing or rhonchi  ABDOMEN: Soft, non-tender, non-distended MUSCULOSKELETAL:  No edema; No deformity  SKIN: Warm and dry LOWER EXTREMITIES: no swelling NEUROLOGIC:  Alert and oriented x 3 PSYCHIATRIC:  Normal affect   ASSESSMENT:    1. Dilated cardiomyopathy ([place])   2. Essential hypertension   3. Laryngeal cancer (HCC) squamous cell   4. PC (prostate cancer) ([place])   5. History of DVT (deep vein thrombosis)   6. History of pulmonary embolism   7. Abnormal stress test minimal ischemia involving apex    PLAN:    In order of problems listed above:  History of dilated cardiomyopathy in the summertime last year we did echocardiogram which showed lower limits of normal ejection fraction.  He is on Entresto as well as a beta-blocker which I will continue. Essential hypertension: Blood pressure well controlled continue present management. Laryngeal cancer apparently stable followed by oncology team Dyslipidemia I did review K PN which show HDL 56 I do have LDL total cholesterol however is 172 Prostate CA stable followed by urology. History of DVT and PE.  He is anticoagulated which I will continue Abnormal stress test but she is completely asymptomatic does not want to have any work-up for it.   Medication Adjustments/Labs and Tests Ordered: Current medicines are reviewed at length with  the patient today.  Concerns regarding medicines are outlined above.  No orders of the defined types were placed in this encounter.  Medication changes: No orders of the defined types were placed in this encounter.   Signed, Park Liter, MD, [facility] 04/29/2021 9:46 AM    [place]

## 2021-05-04 ENCOUNTER — Other Ambulatory Visit: Payer: Self-pay | Admitting: Allergy and Immunology

## 2021-05-04 ENCOUNTER — Other Ambulatory Visit: Payer: Self-pay | Admitting: Cardiology

## 2021-05-27 ENCOUNTER — Ambulatory Visit: Payer: Medicare Other | Admitting: Allergy and Immunology

## 2021-06-10 ENCOUNTER — Other Ambulatory Visit: Payer: Self-pay

## 2021-06-10 ENCOUNTER — Encounter: Payer: Self-pay | Admitting: Allergy and Immunology

## 2021-06-10 ENCOUNTER — Ambulatory Visit: Payer: Medicare HMO | Admitting: Allergy and Immunology

## 2021-06-10 VITALS — BP 112/64 | HR 53 | Resp 16 | Ht 70.5 in | Wt 247.8 lb

## 2021-06-10 DIAGNOSIS — J310 Chronic rhinitis: Secondary | ICD-10-CM | POA: Diagnosis not present

## 2021-06-10 DIAGNOSIS — K219 Gastro-esophageal reflux disease without esophagitis: Secondary | ICD-10-CM

## 2021-06-10 NOTE — Patient Instructions (Addendum)
°  1.  Continue to treat reflux / LPR:   A. Omeprazole 40 mg in AM  B. Minimize caffeine and alcohol consumption  2.  Continue to use nasal ipratropium 0.06% - 2 sprays each nostril every 6 hours to dry up nose. Can use prior to eating.  3. Return to clinic if needed

## 2021-06-10 NOTE — Progress Notes (Signed)
[place] [facility] [place] [facility] [place]   Follow-up Note  Referring Provider: Ronita Hipps, MD Primary Provider: Ronita Hipps, MD Date of Office Visit: 06/10/2021  Subjective:   Allen Macdonald (DOB: [birth year]) is a 71 y.o. male who returns to the [facility] [place] on 06/10/2021 in re-evaluation of the following:  HPI: Allen Macdonald returns to this clinic in evaluation of LPR, rhinitis, and a history of laryngeal cancer.  His last visit to this clinic was 07 May 2020.  He is really doing very well and has very little issues with his throat at this point in time.  He continues to use omeprazole on a consistent basis and has been able to discontinue his famotidine.  He is careful about caffeine consumption not consuming more than 2 cups of coffee in the morning.  His nose is really doing very well while using nasal ipratropium on a rare basis.  He has had an excellent response to radiation therapy for his isolated laryngeal cancer with remission at 1 year.  He has received 5 COVID vaccines and does not think that he has received this years flu vaccine.  Allergies as of 06/10/2021   No Known Allergies      Medication List    bicalutamide 50 MG tablet Commonly known as: CASODEX Take 50 mg by mouth daily.   carvedilol 12.5 MG tablet Commonly known as: COREG Take 1 tablet (12.5 mg total) by mouth 2 (two) times daily.   DENOSUMAB [place] Inject 60 mLs into the skin every 6 (six) months.   Entresto 49-51 MG Generic drug: sacubitril-valsartan Take 1 tablet by mouth 2 (two) times daily.   LUPRON IJ Inject 1 each as directed every 6 (six) months. Unknown strength   omeprazole 40 MG capsule Commonly known as: PRILOSEC TAKE 1 CAPSULE BY MOUTH EVERY MORNING   rivaroxaban 20 MG Tabs tablet Commonly known as: XARELTO Take 20 mg by mouth daily with supper.   spironolactone 25 MG tablet Commonly known as: ALDACTONE Take 12.5 mg by mouth daily.     Past Medical History:  Diagnosis Date   Abnormal stress test minimal ischemia involving apex 05/01/2019   Dilated cardiomyopathy ([place])    Essential hypertension 12/18/2015   History of DVT (deep vein thrombosis) 04/22/2017   History of pulmonary embolism 12/18/2015   History of pulmonary embolus (PE)    Laryngeal cancer (HCC) squamous cell 02/25/2020   PC (prostate cancer) ([place]) 08/28/2019    Past Surgical History:  Procedure Laterality Date   INSERTION PROSTATE RADIATION SEED  2003   PROSTATE SURGERY     Venacava Filter      Review of systems negative except as noted in HPI / PMHx or noted below:  Review of Systems  Constitutional: Negative.   HENT: Negative.    Eyes: Negative.   Respiratory: Negative.    Cardiovascular: Negative.   Gastrointestinal: Negative.   Genitourinary: Negative.   Musculoskeletal: Negative.   Skin: Negative.   Neurological: Negative.   Endo/Heme/Allergies: Negative.   Psychiatric/Behavioral: Negative.      Objective:   Vitals:   06/10/21 1010  BP: 112/64  Pulse: (!) 53  Resp: 16  SpO2: 95%   Height: 5' 10.5" (179.1 cm)  Weight: 247 lb 12.8 oz (112.4 kg)   Physical Exam Constitutional:      Appearance: He is not diaphoretic.  HENT:     Head: Normocephalic.     Right Ear: Tympanic membrane, ear canal and  external ear normal.     Left Ear: Tympanic membrane, ear canal and external ear normal.     Nose: Nose normal. No mucosal edema or rhinorrhea.     Mouth/Throat:     Pharynx: Uvula midline. No oropharyngeal exudate.  Eyes:     Conjunctiva/sclera: Conjunctivae normal.  Neck:     Thyroid: No thyromegaly.     Trachea: Trachea normal. No tracheal tenderness or tracheal deviation.  Cardiovascular:     Rate and Rhythm: Normal rate and regular rhythm.     Heart sounds: Normal heart sounds, S1 normal and S2 normal. No murmur heard. Pulmonary:     Effort: No respiratory distress.     Breath sounds: Normal breath sounds. No stridor. No  wheezing or rales.  Lymphadenopathy:     Head:     Right side of head: No tonsillar adenopathy.     Left side of head: No tonsillar adenopathy.     Cervical: No cervical adenopathy.  Skin:    Findings: No erythema or rash.     Nails: There is no clubbing.  Neurological:     Mental Status: He is alert.    Diagnostics: none  Assessment and Plan:   1. LPRD (laryngopharyngeal reflux disease)   2. Nonallergic rhinitis     1.  Continue to treat reflux / LPR:   A. Omeprazole 40 mg in AM  B. Minimize caffeine and alcohol consumption  2.  Continue to use nasal ipratropium 0.06% - 2 sprays each nostril every 6 hours to dry up nose. Can use prior to eating.  3. Return to clinic if needed  Allen Macdonald is really doing very well on his current plan and I think it is worthwhile for him to remain on omeprazole on a longstanding basis and he certainly has the option of using nasal ipratropium should it be required.  I do not think there is any need for him to follow-up in this clinic on a regular basis and he can have refills of his omeprazole and ipratropium performed by his primary care doctor.  Certainly if he develops more problems in the future he is welcome to return to this clinic.  Allena Katz, MD Allergy / Immunology [place]

## 2021-06-11 ENCOUNTER — Encounter: Payer: Self-pay | Admitting: Allergy and Immunology

## 2021-07-08 DIAGNOSIS — C320 Malignant neoplasm of glottis: Secondary | ICD-10-CM | POA: Diagnosis not present

## 2021-07-08 DIAGNOSIS — Z8521 Personal history of malignant neoplasm of larynx: Secondary | ICD-10-CM | POA: Diagnosis not present

## 2021-07-08 DIAGNOSIS — Z08 Encounter for follow-up examination after completed treatment for malignant neoplasm: Secondary | ICD-10-CM | POA: Diagnosis not present

## 2021-08-31 ENCOUNTER — Other Ambulatory Visit: Payer: Self-pay | Admitting: Cardiology

## 2021-09-07 DIAGNOSIS — J343 Hypertrophy of nasal turbinates: Secondary | ICD-10-CM | POA: Diagnosis not present

## 2021-09-07 DIAGNOSIS — Z8521 Personal history of malignant neoplasm of larynx: Secondary | ICD-10-CM | POA: Diagnosis not present

## 2021-09-07 DIAGNOSIS — R490 Dysphonia: Secondary | ICD-10-CM | POA: Diagnosis not present

## 2021-09-07 DIAGNOSIS — R0981 Nasal congestion: Secondary | ICD-10-CM | POA: Diagnosis not present

## 2021-09-07 DIAGNOSIS — Z923 Personal history of irradiation: Secondary | ICD-10-CM | POA: Diagnosis not present

## 2021-09-07 DIAGNOSIS — J342 Deviated nasal septum: Secondary | ICD-10-CM | POA: Diagnosis not present

## 2021-09-28 ENCOUNTER — Other Ambulatory Visit: Payer: Self-pay | Admitting: Cardiology

## 2021-09-28 NOTE — Telephone Encounter (Signed)
Prescription refill request for Xarelto received.  Indication: DVT/PE Last office visit: 04/29/21  Nelta Numbers MD Weight: 109.8kg Age: 71 Scr: 1.10 on 10/07/21  Scheduled for labs at MD appt 11/2021 CrCl: 92.89  Based on above findings Xarelto '20mg'$  daily is the appropriate dose.  Refill approved.

## 2021-10-14 DIAGNOSIS — I10 Essential (primary) hypertension: Secondary | ICD-10-CM | POA: Diagnosis not present

## 2021-10-14 DIAGNOSIS — Z79899 Other long term (current) drug therapy: Secondary | ICD-10-CM | POA: Diagnosis not present

## 2021-10-14 DIAGNOSIS — Z6832 Body mass index (BMI) 32.0-32.9, adult: Secondary | ICD-10-CM | POA: Diagnosis not present

## 2021-10-14 DIAGNOSIS — Z0000 Encounter for general adult medical examination without abnormal findings: Secondary | ICD-10-CM | POA: Diagnosis not present

## 2021-10-14 DIAGNOSIS — Z1331 Encounter for screening for depression: Secondary | ICD-10-CM | POA: Diagnosis not present

## 2021-10-23 DIAGNOSIS — C61 Malignant neoplasm of prostate: Secondary | ICD-10-CM | POA: Diagnosis not present

## 2021-10-28 ENCOUNTER — Other Ambulatory Visit: Payer: Self-pay | Admitting: Allergy and Immunology

## 2021-11-09 DIAGNOSIS — N401 Enlarged prostate with lower urinary tract symptoms: Secondary | ICD-10-CM | POA: Diagnosis not present

## 2021-11-09 DIAGNOSIS — R3912 Poor urinary stream: Secondary | ICD-10-CM | POA: Diagnosis not present

## 2021-11-09 DIAGNOSIS — R3916 Straining to void: Secondary | ICD-10-CM | POA: Diagnosis not present

## 2021-11-09 DIAGNOSIS — R350 Frequency of micturition: Secondary | ICD-10-CM | POA: Diagnosis not present

## 2021-11-09 DIAGNOSIS — R351 Nocturia: Secondary | ICD-10-CM | POA: Diagnosis not present

## 2021-11-09 DIAGNOSIS — N138 Other obstructive and reflux uropathy: Secondary | ICD-10-CM | POA: Diagnosis not present

## 2021-11-09 DIAGNOSIS — C61 Malignant neoplasm of prostate: Secondary | ICD-10-CM | POA: Diagnosis not present

## 2021-11-09 DIAGNOSIS — Z79818 Long term (current) use of other agents affecting estrogen receptors and estrogen levels: Secondary | ICD-10-CM | POA: Diagnosis not present

## 2021-11-19 DIAGNOSIS — C320 Malignant neoplasm of glottis: Secondary | ICD-10-CM | POA: Diagnosis not present

## 2021-11-19 DIAGNOSIS — Z8521 Personal history of malignant neoplasm of larynx: Secondary | ICD-10-CM | POA: Diagnosis not present

## 2021-11-30 ENCOUNTER — Other Ambulatory Visit: Payer: Self-pay | Admitting: Allergy and Immunology

## 2021-12-10 ENCOUNTER — Ambulatory Visit: Payer: Medicare HMO | Admitting: Cardiology

## 2021-12-10 ENCOUNTER — Encounter: Payer: Self-pay | Admitting: Cardiology

## 2021-12-10 VITALS — BP 106/68 | HR 58 | Resp 18 | Ht 71.0 in | Wt 241.6 lb

## 2021-12-10 DIAGNOSIS — R9439 Abnormal result of other cardiovascular function study: Secondary | ICD-10-CM

## 2021-12-10 DIAGNOSIS — I10 Essential (primary) hypertension: Secondary | ICD-10-CM | POA: Diagnosis not present

## 2021-12-10 DIAGNOSIS — Z86711 Personal history of pulmonary embolism: Secondary | ICD-10-CM | POA: Diagnosis not present

## 2021-12-10 DIAGNOSIS — C61 Malignant neoplasm of prostate: Secondary | ICD-10-CM | POA: Diagnosis not present

## 2021-12-10 DIAGNOSIS — C329 Malignant neoplasm of larynx, unspecified: Secondary | ICD-10-CM

## 2021-12-10 DIAGNOSIS — I420 Dilated cardiomyopathy: Secondary | ICD-10-CM | POA: Diagnosis not present

## 2021-12-10 NOTE — Progress Notes (Signed)
Cardiology Office Note:    Date:  12/10/2021   ID:  Allen Macdonald, DOB [birth year], MRN [id number]  PCP:  Ronita Hipps, MD  Cardiologist:  Jenne Campus, MD    Referring MD: Ronita Hipps, MD   Chief Complaint  Patient presents with   Follow-up  Doing well  History of Present Illness:    Allen Macdonald is a 71 y.o. male with past medical history significant for cardiomyopathy, he was put on guideline directed medical therapy and his ejection fraction improved to lower limits of normal.  Additional problems include minimally abnormal stress test but he is completely asymptomatic as he does not want to do any work-up for it, prostate cancer treated with seeds radiation as well as laryngeal cancer treated with radiation.  He is in my office today, he is doing well.  Denies have any chest pain tightness squeezing pressure burning chest no palpitations dizziness swelling of lower extremities.  He was noted to have frequent ventricular ectopy on the EKG but completely asymptomatic  Past Medical History:  Diagnosis Date   Abnormal stress test minimal ischemia involving apex 05/01/2019   Dilated cardiomyopathy ([place])    Essential hypertension 12/18/2015   History of DVT (deep vein thrombosis) 04/22/2017   History of pulmonary embolism 12/18/2015   History of pulmonary embolus (PE)    Laryngeal cancer (HCC) squamous cell 02/25/2020   PC (prostate cancer) ([place]) 08/28/2019    Past Surgical History:  Procedure Laterality Date   INSERTION PROSTATE RADIATION SEED  2003   PROSTATE SURGERY     Venacava Filter      Current Medications: Current Meds  Medication Sig   bicalutamide (CASODEX) 50 MG tablet Take 50 mg by mouth daily.   carvedilol (COREG) 12.5 MG tablet Take 1 tablet (12.5 mg total) by mouth 2 (two) times daily.   DENOSUMAB [place] Inject 60 mLs into the skin every 6 (six) months.   Leuprolide Acetate (LUPRON IJ) Inject 1 each as directed every 6 (six) months. Unknown strength    omeprazole (PRILOSEC) 40 MG capsule TAKE 1 CAPSULE BY MOUTH EVERY MORNING   rivaroxaban (XARELTO) 20 MG TABS tablet TAKE 1 TABLET BY MOUTH ONCE DAILY WITH SUPPER.   sacubitril-valsartan (ENTRESTO) 49-51 MG Take 1 tablet by mouth 2 (two) times daily.   spironolactone (ALDACTONE) 25 MG tablet TAKE 1/2 TABLET BY MOUTH ONCE DAILY     Allergies:   Patient has no known allergies.   Social History   Socioeconomic History   Marital status: Married    Spouse name: Not on file   Number of children: Not on file   Years of education: Not on file   Highest education level: Not on file  Occupational History   Not on file  Tobacco Use   Smoking status: Never   Smokeless tobacco: Never  Vaping Use   Vaping Use: Never used  Substance and Sexual Activity   Alcohol use: Yes    Alcohol/week: 14.0 standard drinks of alcohol    Types: 10 Cans of beer, 1 Shots of liquor, 3 Glasses of wine per week   Drug use: No   Sexual activity: Not on file  Other Topics Concern   Not on file  Social History Narrative   Not on file   Social Determinants of Health   Financial Resource Strain: Not on file  Food Insecurity: Not on file  Transportation Needs: Not on file  Physical Activity: Not on file  Stress: Not on file  Social Connections: Not on file     Family History: The patient's family history includes Heart failure in his mother; Pleurisy in his mother. ROS:   Please see the history of present illness.    All 14 point review of systems negative except as described per history of present illness  EKGs/Labs/Other Studies Reviewed:      Recent Labs: No results found for requested labs within last 365 days.  Recent Lipid Panel No results found for: "CHOL", "TRIG", "HDL", "CHOLHDL", "VLDL", "LDLCALC", "LDLDIRECT"  Physical Exam:    VS:  BP 106/68 (BP Location: Left Arm, Patient Position: Sitting, Cuff Size: Normal)   Pulse (!) 58   Resp 18   Ht '5\' 11"'$  (1.803 m)   Wt 241 lb 9.6 oz (109.6  kg)   SpO2 97%   BMI 33.70 kg/m     Wt Readings from Last 3 Encounters:  12/10/21 241 lb 9.6 oz (109.6 kg)  06/10/21 247 lb 12.8 oz (112.4 kg)  04/29/21 242 lb (109.8 kg)     GEN:  Well nourished, well developed in no acute distress HEENT: Normal NECK: No JVD; No carotid bruits LYMPHATICS: No lymphadenopathy CARDIAC: RRR, no murmurs, no rubs, no gallops RESPIRATORY:  Clear to auscultation without rales, wheezing or rhonchi  ABDOMEN: Soft, non-tender, non-distended MUSCULOSKELETAL:  No edema; No deformity  SKIN: Warm and dry LOWER EXTREMITIES: no swelling NEUROLOGIC:  Alert and oriented x 3 PSYCHIATRIC:  Normal affect   ASSESSMENT:    1. Dilated cardiomyopathy ([place])   2. Essential hypertension   3. Laryngeal cancer (HCC) squamous cell   4. PC (prostate cancer) ([place])   5. History of pulmonary embolism   6. Abnormal stress test minimal ischemia involving apex    PLAN:    In order of problems listed above:  Dilated cardiomyopathy on appropriate medications I will ask him to have echocardiogram done to recheck left ventricle ejection fraction. Essential hypertension blood pressure well controlled continue present management Laryngeal cancer which was squamous cell, that is being managed with radiation.  I will schedule him to have carotic ultrasounds make sure he does not have carotic artery stenosis secondary to radiation therapy for his laryngeal cancer Prostate cancer apparently stable History of PE as well as DVT, anticoagulated which I will continue   Medication Adjustments/Labs and Tests Ordered: Current medicines are reviewed at length with the patient today.  Concerns regarding medicines are outlined above.  No orders of the defined types were placed in this encounter.  Medication changes: No orders of the defined types were placed in this encounter.   Signed, Park Liter, MD, [facility] 12/10/2021 10:03 AM    [place]

## 2021-12-10 NOTE — Patient Instructions (Signed)
Medication Instructions:  Your physician recommends that you continue on your current medications as directed. Please refer to the Current Medication list given to you today.  *If you need a refill on your cardiac medications before your next appointment, please call your pharmacy*   Lab Work: None If you have labs (blood work) drawn today and your tests are completely normal, you will receive your results only by: [place] (if you have MyChart) OR A paper copy in the mail If you have any lab test that is abnormal or we need to change your treatment, we will call you to review the results.   Testing/Procedures: Your physician has requested that you have an echocardiogram. Echocardiography is a painless test that uses sound waves to create images of your heart. It provides your doctor with information about the size and shape of your heart and how well your heart's chambers and valves are working. This procedure takes approximately one hour. There are no restrictions for this procedure.  Your physician has requested that you have a carotid duplex. This test is an ultrasound of the carotid arteries in your neck. It looks at blood flow through these arteries that supply the brain with blood. Allow one hour for this exam. There are no restrictions or special instructions.    Follow-Up: At [facility], you and your health needs are our priority.  As part of our continuing mission to provide you with exceptional heart care, we have created designated Provider Care Teams.  These Care Teams include your primary Cardiologist (physician) and Advanced Practice Providers (APPs -  Physician Assistants and Nurse Practitioners) who all work together to provide you with the care you need, when you need it.  We recommend signing up for the patient portal called "MyChart".  Sign up information is provided on this After Visit Summary.  MyChart is used to connect with patients for Virtual Visits  (Telemedicine).  Patients are able to view lab/test results, encounter notes, upcoming appointments, etc.  Non-urgent messages can be sent to your provider as well.   To learn more about what you can do with MyChart, go to [email or web address].    Your next appointment:   6 month(s)  The format for your next appointment:   In Person  Provider:   Jenne Campus, MD    Other Instructions None  Important Information About Sugar

## 2021-12-10 NOTE — Addendum Note (Signed)
Addended by: Edwyna Shell I on: 12/10/2021 10:14 AM   Modules accepted: Orders

## 2021-12-23 DIAGNOSIS — H5213 Myopia, bilateral: Secondary | ICD-10-CM | POA: Diagnosis not present

## 2021-12-24 ENCOUNTER — Ambulatory Visit: Payer: Medicare HMO | Attending: Cardiology

## 2021-12-24 ENCOUNTER — Ambulatory Visit (INDEPENDENT_AMBULATORY_CARE_PROVIDER_SITE_OTHER): Payer: Medicare HMO

## 2021-12-24 DIAGNOSIS — C61 Malignant neoplasm of prostate: Secondary | ICD-10-CM

## 2021-12-24 DIAGNOSIS — I420 Dilated cardiomyopathy: Secondary | ICD-10-CM

## 2021-12-24 DIAGNOSIS — R9439 Abnormal result of other cardiovascular function study: Secondary | ICD-10-CM

## 2021-12-24 DIAGNOSIS — Z86711 Personal history of pulmonary embolism: Secondary | ICD-10-CM

## 2021-12-24 DIAGNOSIS — C329 Malignant neoplasm of larynx, unspecified: Secondary | ICD-10-CM | POA: Diagnosis not present

## 2021-12-24 DIAGNOSIS — I10 Essential (primary) hypertension: Secondary | ICD-10-CM | POA: Diagnosis not present

## 2021-12-24 LAB — ECHOCARDIOGRAM COMPLETE
Area-P 1/2: 2.61 cm2
Calc EF: 48.6 %
S' Lateral: 4.3 cm
Single Plane A2C EF: 49.6 %
Single Plane A4C EF: 45.3 %

## 2022-01-06 ENCOUNTER — Telehealth: Payer: Self-pay

## 2022-01-06 NOTE — Telephone Encounter (Signed)
-----   Message from Park Liter, MD sent at 12/30/2021 11:17 AM EDT ----- Coronary artery showing no significant stenosis

## 2022-01-06 NOTE — Telephone Encounter (Signed)
Unable to reach the patient, I mailed a letter requesting a call back

## 2022-01-14 ENCOUNTER — Telehealth: Payer: Self-pay | Admitting: Cardiology

## 2022-01-14 NOTE — Telephone Encounter (Signed)
Pt returning call in regards to results. Please advise

## 2022-01-14 NOTE — Telephone Encounter (Signed)
Results reviewed with pt as per Dr. Krasowski's note.  Pt verbalized understanding and had no additional questions. Routed to PCP  

## 2022-01-22 DIAGNOSIS — H25812 Combined forms of age-related cataract, left eye: Secondary | ICD-10-CM | POA: Diagnosis not present

## 2022-02-08 DIAGNOSIS — Z23 Encounter for immunization: Secondary | ICD-10-CM | POA: Diagnosis not present

## 2022-02-16 DIAGNOSIS — L819 Disorder of pigmentation, unspecified: Secondary | ICD-10-CM | POA: Diagnosis not present

## 2022-02-16 DIAGNOSIS — H0013 Chalazion right eye, unspecified eyelid: Secondary | ICD-10-CM | POA: Diagnosis not present

## 2022-02-16 DIAGNOSIS — Z6832 Body mass index (BMI) 32.0-32.9, adult: Secondary | ICD-10-CM | POA: Diagnosis not present

## 2022-02-23 DIAGNOSIS — H25812 Combined forms of age-related cataract, left eye: Secondary | ICD-10-CM | POA: Diagnosis not present

## 2022-02-23 DIAGNOSIS — Z01818 Encounter for other preprocedural examination: Secondary | ICD-10-CM | POA: Diagnosis not present

## 2022-03-01 ENCOUNTER — Other Ambulatory Visit: Payer: Self-pay | Admitting: Cardiology

## 2022-03-02 DIAGNOSIS — I10 Essential (primary) hypertension: Secondary | ICD-10-CM | POA: Diagnosis not present

## 2022-03-02 DIAGNOSIS — H259 Unspecified age-related cataract: Secondary | ICD-10-CM | POA: Diagnosis not present

## 2022-03-02 DIAGNOSIS — H25812 Combined forms of age-related cataract, left eye: Secondary | ICD-10-CM | POA: Diagnosis not present

## 2022-03-16 DIAGNOSIS — I10 Essential (primary) hypertension: Secondary | ICD-10-CM | POA: Diagnosis not present

## 2022-03-16 DIAGNOSIS — Z86711 Personal history of pulmonary embolism: Secondary | ICD-10-CM | POA: Diagnosis not present

## 2022-03-16 DIAGNOSIS — H0011 Chalazion right upper eyelid: Secondary | ICD-10-CM | POA: Diagnosis not present

## 2022-03-16 DIAGNOSIS — H259 Unspecified age-related cataract: Secondary | ICD-10-CM | POA: Diagnosis not present

## 2022-03-16 DIAGNOSIS — Z7901 Long term (current) use of anticoagulants: Secondary | ICD-10-CM | POA: Diagnosis not present

## 2022-03-16 DIAGNOSIS — Z79899 Other long term (current) drug therapy: Secondary | ICD-10-CM | POA: Diagnosis not present

## 2022-03-16 DIAGNOSIS — K219 Gastro-esophageal reflux disease without esophagitis: Secondary | ICD-10-CM | POA: Diagnosis not present

## 2022-03-16 DIAGNOSIS — H2511 Age-related nuclear cataract, right eye: Secondary | ICD-10-CM | POA: Diagnosis not present

## 2022-03-16 DIAGNOSIS — H25811 Combined forms of age-related cataract, right eye: Secondary | ICD-10-CM | POA: Diagnosis not present

## 2022-03-23 DIAGNOSIS — Z8521 Personal history of malignant neoplasm of larynx: Secondary | ICD-10-CM | POA: Diagnosis not present

## 2022-03-23 DIAGNOSIS — Z923 Personal history of irradiation: Secondary | ICD-10-CM | POA: Diagnosis not present

## 2022-03-23 DIAGNOSIS — R490 Dysphonia: Secondary | ICD-10-CM | POA: Diagnosis not present

## 2022-03-26 DIAGNOSIS — H59033 Cystoid macular edema following cataract surgery, bilateral: Secondary | ICD-10-CM | POA: Diagnosis not present

## 2022-03-29 ENCOUNTER — Other Ambulatory Visit: Payer: Self-pay | Admitting: Cardiology

## 2022-03-29 DIAGNOSIS — Z86718 Personal history of other venous thrombosis and embolism: Secondary | ICD-10-CM

## 2022-03-29 DIAGNOSIS — Z86711 Personal history of pulmonary embolism: Secondary | ICD-10-CM

## 2022-03-29 NOTE — Telephone Encounter (Signed)
Xarelto '20mg'$  refill request received. Pt is 71 years old, weight-109.6kg, Crea-1.30 on 10/14/2021 via [facility] from [facility], last seen by Dr. Agustin Cree on 12/10/2021, Diagnosis-DVT & PE, CrCl- 78.45 mL/min; Dose is appropriate based on dosing criteria. Will send in refill to requested pharmacy.

## 2022-05-03 ENCOUNTER — Other Ambulatory Visit: Payer: Self-pay | Admitting: Cardiology

## 2022-05-31 ENCOUNTER — Other Ambulatory Visit: Payer: Self-pay | Admitting: Allergy and Immunology

## 2022-06-08 DIAGNOSIS — H59033 Cystoid macular edema following cataract surgery, bilateral: Secondary | ICD-10-CM | POA: Diagnosis not present

## 2022-06-21 ENCOUNTER — Encounter: Payer: Self-pay | Admitting: Cardiology

## 2022-06-21 ENCOUNTER — Ambulatory Visit: Payer: Medicare HMO | Attending: Cardiology | Admitting: Cardiology

## 2022-06-21 VITALS — BP 90/60 | HR 56 | Ht 71.0 in | Wt 248.0 lb

## 2022-06-21 DIAGNOSIS — I10 Essential (primary) hypertension: Secondary | ICD-10-CM

## 2022-06-21 DIAGNOSIS — I420 Dilated cardiomyopathy: Secondary | ICD-10-CM | POA: Diagnosis not present

## 2022-06-21 DIAGNOSIS — C61 Malignant neoplasm of prostate: Secondary | ICD-10-CM

## 2022-06-21 DIAGNOSIS — C329 Malignant neoplasm of larynx, unspecified: Secondary | ICD-10-CM | POA: Diagnosis not present

## 2022-06-21 DIAGNOSIS — Z86711 Personal history of pulmonary embolism: Secondary | ICD-10-CM

## 2022-06-21 NOTE — Progress Notes (Signed)
Cardiology Office Note:    Date:  06/21/2022   ID:  Allen Macdonald, DOB [birth year], MRN [id number]  PCP:  Ronita Hipps, MD  Cardiologist:  Jenne Campus, MD    Referring MD: Ronita Hipps, MD   Chief Complaint  Patient presents with   Follow-up  Doing well  History of Present Illness:    Allen Macdonald is a 72 y.o. male past medical history significant for cardiomyopathy however latest echocardiogram showed normal low normal ejection fraction, he did have a minimally abnormal stress test but he is absolutely completely asymptomatic does not want to do any workup for ischemia, additional problem include prostate cancer treated with seed radiation, laryngeal cancer treated with radiation.  He is in my office today for follow-up.  Overall doing very well.  He denies have any chest pain tightness squeezing pressure burning chest no palpitation dizziness swelling of lower extremities.  Past Medical History:  Diagnosis Date   Abnormal stress test minimal ischemia involving apex 05/01/2019   Dilated cardiomyopathy (HCC)    Essential hypertension 12/18/2015   History of DVT (deep vein thrombosis) 04/22/2017   History of pulmonary embolism 12/18/2015   History of pulmonary embolus (PE)    Laryngeal cancer (HCC) squamous cell 02/25/2020   PC (prostate cancer) ([place]) 08/28/2019    Past Surgical History:  Procedure Laterality Date   INSERTION PROSTATE RADIATION SEED  2003   PROSTATE SURGERY     Venacava Filter      Current Medications: Current Meds  Medication Sig   bicalutamide (CASODEX) 50 MG tablet Take 50 mg by mouth daily.   carvedilol (COREG) 12.5 MG tablet Take 1 tablet (12.5 mg total) by mouth 2 (two) times daily.   DENOSUMAB [place] Inject 60 mLs into the skin every 6 (six) months.   Leuprolide Acetate (LUPRON IJ) Inject 1 each as directed every 6 (six) months. Unknown strength   omeprazole (PRILOSEC) 40 MG capsule TAKE 1 CAPSULE BY MOUTH EVERY MORNING   sacubitril-valsartan  (ENTRESTO) 49-51 MG Take 1 tablet by mouth 2 (two) times daily.   spironolactone (ALDACTONE) 25 MG tablet Take 0.5 tablets (12.5 mg total) by mouth daily.   XARELTO 20 MG TABS tablet TAKE ONE TABLET BY MOUTH EVERY DAY WITH SUPPER     Allergies:   Patient has no known allergies.   Social History   Socioeconomic History   Marital status: Married    Spouse name: Not on file   Number of children: Not on file   Years of education: Not on file   Highest education level: Not on file  Occupational History   Not on file  Tobacco Use   Smoking status: Never   Smokeless tobacco: Never  Vaping Use   Vaping Use: Never used  Substance and Sexual Activity   Alcohol use: Yes    Alcohol/week: 14.0 standard drinks of alcohol    Types: 10 Cans of beer, 1 Shots of liquor, 3 Glasses of wine per week   Drug use: No   Sexual activity: Not on file  Other Topics Concern   Not on file  Social History Narrative   Not on file   Social Determinants of Health   Financial Resource Strain: Not on file  Food Insecurity: Not on file  Transportation Needs: Not on file  Physical Activity: Not on file  Stress: Not on file  Social Connections: Not on file     Family History: The patient's family history includes Heart failure in his mother;  Pleurisy in his mother. ROS:   Please see the history of present illness.    All 14 point review of systems negative except as described per history of present illness  EKGs/Labs/Other Studies Reviewed:      Recent Labs: No results found for requested labs within last 365 days.  Recent Lipid Panel No results found for: "CHOL", "TRIG", "HDL", "CHOLHDL", "VLDL", "LDLCALC", "LDLDIRECT"  Physical Exam:    VS:  BP 90/60 (BP Location: Left Arm, Patient Position: Sitting, Cuff Size: Normal)   Pulse (!) 56   Ht '5\' 11"'$  (1.803 m)   Wt 248 lb (112.5 kg)   SpO2 97%   BMI 34.59 kg/m     Wt Readings from Last 3 Encounters:  06/21/22 248 lb (112.5 kg)  12/10/21  241 lb 9.6 oz (109.6 kg)  06/10/21 247 lb 12.8 oz (112.4 kg)     GEN:  Well nourished, well developed in no acute distress HEENT: Normal NECK: No JVD; No carotid bruits LYMPHATICS: No lymphadenopathy CARDIAC: RRR, no murmurs, no rubs, no gallops RESPIRATORY:  Clear to auscultation without rales, wheezing or rhonchi  ABDOMEN: Soft, non-tender, non-distended MUSCULOSKELETAL:  No edema; No deformity  SKIN: Warm and dry LOWER EXTREMITIES: no swelling NEUROLOGIC:  Alert and oriented x 3 PSYCHIATRIC:  Normal affect   ASSESSMENT:    1. Dilated cardiomyopathy ([place])   2. Essential hypertension   3. Laryngeal cancer (HCC) squamous cell   4. History of pulmonary embolism   5. PC (prostate cancer) ([place])    PLAN:    In order of problems listed above:  Dilated cardiomyopathy last ejection fraction lower limits of normal on guideline directed medical therapy continue present management. Essential hypertension actually opposite problem blood pressure remain low continue monitoring. History of laryngeal cancer.  Overall doing well from that point review. History of pulmonary emboli continue anticoagulation.  No reactivation of the problem Prostate cancer stable Dyslipidemia I did review K PN which show total cholesterol 177 HDL 51   Medication Adjustments/Labs and Tests Ordered: Current medicines are reviewed at length with the patient today.  Concerns regarding medicines are outlined above.  No orders of the defined types were placed in this encounter.  Medication changes: No orders of the defined types were placed in this encounter.   Signed, Park Liter, MD, [facility] 06/21/2022 10:46 AM    [place]

## 2022-06-21 NOTE — Patient Instructions (Signed)

## 2022-06-25 DIAGNOSIS — C61 Malignant neoplasm of prostate: Secondary | ICD-10-CM | POA: Diagnosis not present

## 2022-07-02 DIAGNOSIS — R350 Frequency of micturition: Secondary | ICD-10-CM | POA: Diagnosis not present

## 2022-07-02 DIAGNOSIS — R351 Nocturia: Secondary | ICD-10-CM | POA: Diagnosis not present

## 2022-07-02 DIAGNOSIS — N401 Enlarged prostate with lower urinary tract symptoms: Secondary | ICD-10-CM | POA: Diagnosis not present

## 2022-07-02 DIAGNOSIS — Z79818 Long term (current) use of other agents affecting estrogen receptors and estrogen levels: Secondary | ICD-10-CM | POA: Diagnosis not present

## 2022-07-02 DIAGNOSIS — R3916 Straining to void: Secondary | ICD-10-CM | POA: Diagnosis not present

## 2022-07-02 DIAGNOSIS — M818 Other osteoporosis without current pathological fracture: Secondary | ICD-10-CM | POA: Diagnosis not present

## 2022-07-02 DIAGNOSIS — C61 Malignant neoplasm of prostate: Secondary | ICD-10-CM | POA: Diagnosis not present

## 2022-07-09 ENCOUNTER — Other Ambulatory Visit: Payer: Self-pay

## 2022-07-09 ENCOUNTER — Telehealth: Payer: Self-pay | Admitting: Cardiology

## 2022-07-09 DIAGNOSIS — Z86718 Personal history of other venous thrombosis and embolism: Secondary | ICD-10-CM

## 2022-07-09 DIAGNOSIS — Z86711 Personal history of pulmonary embolism: Secondary | ICD-10-CM

## 2022-07-09 MED ORDER — RIVAROXABAN 20 MG PO TABS: ORAL_TABLET | ORAL | 0 refills | Status: DC

## 2022-07-09 NOTE — Telephone Encounter (Signed)
Called patient and informed him that samples of his medication Xarelto were left in the sample closet for him to pick up. Patient verbalized understanding and had no further questions at this time.

## 2022-07-09 NOTE — Telephone Encounter (Signed)
Pharmacy unable to get Xarelto due to being "hacked"; patient ran out yesterday and has no way of getting more. They are hoping to get it in sometime next week- Patient is hoping we can give samples to last until then.  Best number to call [phone]  Thank you!

## 2022-08-18 DIAGNOSIS — J3489 Other specified disorders of nose and nasal sinuses: Secondary | ICD-10-CM | POA: Diagnosis not present

## 2022-08-18 DIAGNOSIS — J069 Acute upper respiratory infection, unspecified: Secondary | ICD-10-CM | POA: Diagnosis not present

## 2022-08-18 DIAGNOSIS — R051 Acute cough: Secondary | ICD-10-CM | POA: Diagnosis not present

## 2022-08-18 DIAGNOSIS — R062 Wheezing: Secondary | ICD-10-CM | POA: Diagnosis not present

## 2022-08-18 DIAGNOSIS — R0981 Nasal congestion: Secondary | ICD-10-CM | POA: Diagnosis not present

## 2022-08-31 DIAGNOSIS — D485 Neoplasm of uncertain behavior of skin: Secondary | ICD-10-CM | POA: Diagnosis not present

## 2022-08-31 DIAGNOSIS — L814 Other melanin hyperpigmentation: Secondary | ICD-10-CM | POA: Diagnosis not present

## 2022-08-31 DIAGNOSIS — D692 Other nonthrombocytopenic purpura: Secondary | ICD-10-CM | POA: Diagnosis not present

## 2022-09-24 ENCOUNTER — Other Ambulatory Visit: Payer: Self-pay | Admitting: Cardiology

## 2022-09-24 DIAGNOSIS — Z86718 Personal history of other venous thrombosis and embolism: Secondary | ICD-10-CM

## 2022-09-24 DIAGNOSIS — Z86711 Personal history of pulmonary embolism: Secondary | ICD-10-CM

## 2022-09-24 NOTE — Telephone Encounter (Signed)
Prescription refill request for Xarelto received.  Indication:PE Last office visit:3/24 Weight:112.5  kg Age:72 Scr:1.3  6/23 CrCl:80.53  ml/min  Prescription refilled

## 2022-11-18 DIAGNOSIS — K219 Gastro-esophageal reflux disease without esophagitis: Secondary | ICD-10-CM | POA: Diagnosis not present

## 2022-11-18 DIAGNOSIS — Z1331 Encounter for screening for depression: Secondary | ICD-10-CM | POA: Diagnosis not present

## 2022-11-18 DIAGNOSIS — Z0000 Encounter for general adult medical examination without abnormal findings: Secondary | ICD-10-CM | POA: Diagnosis not present

## 2022-11-18 DIAGNOSIS — C61 Malignant neoplasm of prostate: Secondary | ICD-10-CM | POA: Diagnosis not present

## 2022-11-18 DIAGNOSIS — I10 Essential (primary) hypertension: Secondary | ICD-10-CM | POA: Diagnosis not present

## 2022-11-18 DIAGNOSIS — Z1339 Encounter for screening examination for other mental health and behavioral disorders: Secondary | ICD-10-CM | POA: Diagnosis not present

## 2022-11-18 DIAGNOSIS — Z6834 Body mass index (BMI) 34.0-34.9, adult: Secondary | ICD-10-CM | POA: Diagnosis not present

## 2022-12-01 ENCOUNTER — Other Ambulatory Visit: Payer: Self-pay | Admitting: Cardiology

## 2022-12-27 DIAGNOSIS — C61 Malignant neoplasm of prostate: Secondary | ICD-10-CM | POA: Diagnosis not present

## 2023-01-03 ENCOUNTER — Ambulatory Visit: Payer: Medicare HMO | Attending: Cardiology | Admitting: Cardiology

## 2023-01-03 ENCOUNTER — Encounter: Payer: Self-pay | Admitting: Cardiology

## 2023-01-03 VITALS — BP 110/58 | HR 62 | Ht 71.0 in | Wt 249.0 lb

## 2023-01-03 DIAGNOSIS — Z86711 Personal history of pulmonary embolism: Secondary | ICD-10-CM

## 2023-01-03 DIAGNOSIS — I420 Dilated cardiomyopathy: Secondary | ICD-10-CM | POA: Diagnosis not present

## 2023-01-03 DIAGNOSIS — C329 Malignant neoplasm of larynx, unspecified: Secondary | ICD-10-CM | POA: Diagnosis not present

## 2023-01-03 DIAGNOSIS — I10 Essential (primary) hypertension: Secondary | ICD-10-CM | POA: Diagnosis not present

## 2023-01-03 DIAGNOSIS — N401 Enlarged prostate with lower urinary tract symptoms: Secondary | ICD-10-CM | POA: Diagnosis not present

## 2023-01-03 DIAGNOSIS — Z79818 Long term (current) use of other agents affecting estrogen receptors and estrogen levels: Secondary | ICD-10-CM | POA: Diagnosis not present

## 2023-01-03 DIAGNOSIS — C61 Malignant neoplasm of prostate: Secondary | ICD-10-CM

## 2023-01-03 DIAGNOSIS — N138 Other obstructive and reflux uropathy: Secondary | ICD-10-CM | POA: Diagnosis not present

## 2023-01-03 DIAGNOSIS — R0609 Other forms of dyspnea: Secondary | ICD-10-CM

## 2023-01-03 NOTE — Addendum Note (Signed)
Addended by: Baldo Ash D on: 01/03/2023 09:39 AM   Modules accepted: Orders

## 2023-01-03 NOTE — Patient Instructions (Signed)

## 2023-01-03 NOTE — Progress Notes (Signed)
Cardiology Office Note:    Date:  01/03/2023   ID:  Allen Macdonald, DOB 1950-12-22, MRN [id number]  PCP:  Marylen Ponto, MD  Cardiologist:  Gypsy Balsam, MD    Referring MD: Marylen Ponto, MD   Chief Complaint  Patient presents with   Follow-up    History of Present Illness:    Allen Macdonald is a 72 y.o. male past medical history significant for cardiomyopathy with ejection fraction 4550, normalization, also minimally abnormal stress of but completely asymptomatic in favor of conservative approach at this additional problem include prostate cancer treated with seed radiation laryngeal cancer also treated with radiation.  Comes today to months for follow-up.  Overall doing well.  He denies have any chest pain tightness squeezing pressure burning chest.  Doing well overall.  Past Medical History:  Diagnosis Date   Abnormal stress test minimal ischemia involving apex 05/01/2019   Dilated cardiomyopathy (HCC)    Essential hypertension 12/18/2015   History of DVT (deep vein thrombosis) 04/22/2017   History of pulmonary embolism 12/18/2015   History of pulmonary embolus (PE)    Laryngeal cancer (HCC) squamous cell 02/25/2020   PC (prostate cancer) (HCC) 08/28/2019    Past Surgical History:  Procedure Laterality Date   INSERTION PROSTATE RADIATION SEED  2003   PROSTATE SURGERY     Venacava Filter      Current Medications: Current Meds  Medication Sig   bicalutamide (CASODEX) 50 MG tablet Take 50 mg by mouth daily.   carvedilol (COREG) 12.5 MG tablet Take 1 tablet (12.5 mg total) by mouth 2 (two) times daily.   DENOSUMAB [place] Inject 60 mLs into the skin every 6 (six) months.   Leuprolide Acetate (LUPRON IJ) Inject 1 each as directed every 6 (six) months. Unknown strength   omeprazole (PRILOSEC) 40 MG capsule TAKE 1 CAPSULE BY MOUTH EVERY MORNING (Patient taking differently: Take 40 mg by mouth daily.)   rivaroxaban (XARELTO) 20 MG TABS tablet TAKE 1 TABLET BY MOUTH EVERY DAY  WITH SUPPER (Patient taking differently: Take 20 mg by mouth daily with supper.)   sacubitril-valsartan (ENTRESTO) 49-51 MG Take 1 tablet by mouth 2 (two) times daily.   spironolactone (ALDACTONE) 25 MG tablet Take 0.5 tablets (12.5 mg total) by mouth daily.     Allergies:   Patient has no known allergies.   Social History   Socioeconomic History   Marital status: Married    Spouse name: Not on file   Number of children: Not on file   Years of education: Not on file   Highest education level: Not on file  Occupational History   Not on file  Tobacco Use   Smoking status: Never   Smokeless tobacco: Never  Vaping Use   Vaping status: Never Used  Substance and Sexual Activity   Alcohol use: Yes    Alcohol/week: 14.0 standard drinks of alcohol    Types: 10 Cans of beer, 1 Shots of liquor, 3 Glasses of wine per week   Drug use: No   Sexual activity: Not on file  Other Topics Concern   Not on file  Social History Narrative   Not on file   Social Determinants of Health   Financial Resource Strain: Not on file  Food Insecurity: Not on file  Transportation Needs: Not on file  Physical Activity: Not on file  Stress: Not on file  Social Connections: Not on file     Family History: The patient's family history includes Heart failure  in his mother; Pleurisy in his mother. ROS:   Please see the history of present illness.    All 14 point review of systems negative except as described per history of present illness  EKGs/Labs/Other Studies Reviewed:    EKG Interpretation Date/Time:  Monday January 03 2023 09:03:09 EDT Ventricular Rate:  62 PR Interval:  210 QRS Duration:  104 QT Interval:  422 QTC Calculation: 428 R Axis:   56  Text Interpretation: Sinus rhythm with 1st degree A-V block Otherwise normal ECG No previous ECGs available Confirmed by Gypsy Balsam [phone]) on 01/03/2023 9:24:14 AM    Recent Labs: No results found for requested labs within last 365  days.  Recent Lipid Panel No results found for: "CHOL", "TRIG", "HDL", "CHOLHDL", "VLDL", "LDLCALC", "LDLDIRECT"  Physical Exam:    VS:  BP (!) 110/58 (BP Location: Left Arm, Patient Position: Sitting)   Pulse 62   Ht 5\' 11"  (1.803 m)   Wt 249 lb (112.9 kg)   SpO2 96%   BMI 34.73 kg/m     Wt Readings from Last 3 Encounters:  01/03/23 249 lb (112.9 kg)  06/21/22 248 lb (112.5 kg)  12/10/21 241 lb 9.6 oz (109.6 kg)     GEN:  Well nourished, well developed in no acute distress HEENT: Normal NECK: No JVD; No carotid bruits LYMPHATICS: No lymphadenopathy CARDIAC: RRR, no murmurs, no rubs, no gallops RESPIRATORY:  Clear to auscultation without rales, wheezing or rhonchi  ABDOMEN: Soft, non-tender, non-distended MUSCULOSKELETAL:  No edema; No deformity  SKIN: Warm and dry LOWER EXTREMITIES: no swelling NEUROLOGIC:  Alert and oriented x 3 PSYCHIATRIC:  Normal affect   ASSESSMENT:    1. Essential hypertension   2. Dilated cardiomyopathy (HCC)   3. Laryngeal cancer (HCC) squamous cell   4. PC (prostate cancer) (HCC)   5. History of pulmonary embolism    PLAN:    In order of problems listed above:  Essential hypertension blood pressure well-controlled continue present management. Cardiomyopathy will repeat echocardiogram.  He has minimal swelling of lower extremities but he said he went to [place] and he ate around staff and he blames it on that.  Again echocardiogram will be done. Laryngeal cancer status post radiation.  Apparently doing well. History of pulmonary emboli continue anticoagulation. Prostate cancer followed by urology apparently stable.   Medication Adjustments/Labs and Tests Ordered: Current medicines are reviewed at length with the patient today.  Concerns regarding medicines are outlined above.  Orders Placed This Encounter  Procedures   EKG 12-Lead   Medication changes: No orders of the defined types were placed in this  encounter.   Signed, Georgeanna Lea, MD, [facility] 01/03/2023 9:30 AM    [facility]

## 2023-01-04 DIAGNOSIS — C6930 Malignant neoplasm of unspecified choroid: Secondary | ICD-10-CM | POA: Diagnosis not present

## 2023-01-04 DIAGNOSIS — H3129 Other hereditary choroidal dystrophy: Secondary | ICD-10-CM | POA: Diagnosis not present

## 2023-01-04 DIAGNOSIS — Z9849 Cataract extraction status, unspecified eye: Secondary | ICD-10-CM | POA: Diagnosis not present

## 2023-01-04 DIAGNOSIS — Z0100 Encounter for examination of eyes and vision without abnormal findings: Secondary | ICD-10-CM | POA: Diagnosis not present

## 2023-01-04 DIAGNOSIS — Z961 Presence of intraocular lens: Secondary | ICD-10-CM | POA: Diagnosis not present

## 2023-01-04 DIAGNOSIS — H52223 Regular astigmatism, bilateral: Secondary | ICD-10-CM | POA: Diagnosis not present

## 2023-01-04 DIAGNOSIS — H5213 Myopia, bilateral: Secondary | ICD-10-CM | POA: Diagnosis not present

## 2023-01-04 DIAGNOSIS — H524 Presbyopia: Secondary | ICD-10-CM | POA: Diagnosis not present

## 2023-01-11 DIAGNOSIS — Z743 Need for continuous supervision: Secondary | ICD-10-CM | POA: Diagnosis not present

## 2023-01-11 DIAGNOSIS — R0689 Other abnormalities of breathing: Secondary | ICD-10-CM | POA: Diagnosis not present

## 2023-01-11 DIAGNOSIS — R404 Transient alteration of awareness: Secondary | ICD-10-CM | POA: Diagnosis not present

## 2023-01-11 DIAGNOSIS — T7840XA Allergy, unspecified, initial encounter: Secondary | ICD-10-CM | POA: Diagnosis not present

## 2023-01-11 DIAGNOSIS — T782XXA Anaphylactic shock, unspecified, initial encounter: Secondary | ICD-10-CM | POA: Diagnosis not present

## 2023-01-11 DIAGNOSIS — T63441A Toxic effect of venom of bees, accidental (unintentional), initial encounter: Secondary | ICD-10-CM | POA: Diagnosis not present

## 2023-01-11 DIAGNOSIS — Z86711 Personal history of pulmonary embolism: Secondary | ICD-10-CM | POA: Diagnosis not present

## 2023-01-11 DIAGNOSIS — X58XXXA Exposure to other specified factors, initial encounter: Secondary | ICD-10-CM | POA: Diagnosis not present

## 2023-01-11 DIAGNOSIS — Z923 Personal history of irradiation: Secondary | ICD-10-CM | POA: Diagnosis not present

## 2023-01-11 DIAGNOSIS — Z7901 Long term (current) use of anticoagulants: Secondary | ICD-10-CM | POA: Diagnosis not present

## 2023-01-11 DIAGNOSIS — Z452 Encounter for adjustment and management of vascular access device: Secondary | ICD-10-CM | POA: Diagnosis not present

## 2023-01-11 DIAGNOSIS — R0902 Hypoxemia: Secondary | ICD-10-CM | POA: Diagnosis not present

## 2023-01-11 DIAGNOSIS — Z86718 Personal history of other venous thrombosis and embolism: Secondary | ICD-10-CM | POA: Diagnosis not present

## 2023-01-11 DIAGNOSIS — R918 Other nonspecific abnormal finding of lung field: Secondary | ICD-10-CM | POA: Diagnosis not present

## 2023-01-11 DIAGNOSIS — Z8521 Personal history of malignant neoplasm of larynx: Secondary | ICD-10-CM | POA: Diagnosis not present

## 2023-01-11 DIAGNOSIS — Z8546 Personal history of malignant neoplasm of prostate: Secondary | ICD-10-CM | POA: Diagnosis not present

## 2023-01-11 DIAGNOSIS — J45909 Unspecified asthma, uncomplicated: Secondary | ICD-10-CM | POA: Diagnosis not present

## 2023-01-11 DIAGNOSIS — I10 Essential (primary) hypertension: Secondary | ICD-10-CM | POA: Diagnosis not present

## 2023-01-12 DIAGNOSIS — T63441A Toxic effect of venom of bees, accidental (unintentional), initial encounter: Secondary | ICD-10-CM | POA: Diagnosis not present

## 2023-01-16 DIAGNOSIS — I493 Ventricular premature depolarization: Secondary | ICD-10-CM | POA: Diagnosis not present

## 2023-02-04 ENCOUNTER — Other Ambulatory Visit: Payer: Medicare HMO

## 2023-02-07 DIAGNOSIS — T782XXA Anaphylactic shock, unspecified, initial encounter: Secondary | ICD-10-CM | POA: Diagnosis not present

## 2023-02-07 DIAGNOSIS — Z6834 Body mass index (BMI) 34.0-34.9, adult: Secondary | ICD-10-CM | POA: Diagnosis not present

## 2023-02-11 ENCOUNTER — Ambulatory Visit: Payer: Medicare HMO | Attending: Cardiology

## 2023-02-11 DIAGNOSIS — R0609 Other forms of dyspnea: Secondary | ICD-10-CM

## 2023-02-11 LAB — ECHOCARDIOGRAM COMPLETE
AR max vel: 1.61 cm2
AV Area VTI: 1.7 cm2
AV Area mean vel: 1.63 cm2
AV Mean grad: 6 mm[Hg]
AV Peak grad: 10.5 mm[Hg]
Ao pk vel: 1.62 m/s
Area-P 1/2: 3.1 cm2
MV M vel: 3.72 m/s
MV Peak grad: 55.4 mm[Hg]
S' Lateral: 4.1 cm

## 2023-02-15 ENCOUNTER — Telehealth: Payer: Self-pay

## 2023-02-15 NOTE — Telephone Encounter (Signed)
Medication list provided by the patient has been updated and processed to scan.

## 2023-02-17 ENCOUNTER — Ambulatory Visit: Payer: Medicare HMO | Admitting: Allergy and Immunology

## 2023-02-17 ENCOUNTER — Encounter: Payer: Self-pay | Admitting: Allergy and Immunology

## 2023-02-17 VITALS — BP 126/82 | HR 54 | Resp 16 | Ht 71.0 in | Wt 246.2 lb

## 2023-02-17 DIAGNOSIS — K219 Gastro-esophageal reflux disease without esophagitis: Secondary | ICD-10-CM

## 2023-02-17 DIAGNOSIS — J310 Chronic rhinitis: Secondary | ICD-10-CM

## 2023-02-17 DIAGNOSIS — T782XXD Anaphylactic shock, unspecified, subsequent encounter: Secondary | ICD-10-CM | POA: Diagnosis not present

## 2023-02-17 DIAGNOSIS — T63481D Toxic effect of venom of other arthropod, accidental (unintentional), subsequent encounter: Secondary | ICD-10-CM | POA: Diagnosis not present

## 2023-02-17 NOTE — Patient Instructions (Addendum)
  1.  Continue to treat reflux / LPR:   A. Omeprazole 40 mg in AM  B. Minimize caffeine and alcohol consumption  2.  Treat and prevent venom reaction:   A. Loratadine 10 mg - 1 tablet 2 times per day  B. Famotidine 20 mg - 1 tablet 2 times per day  C. Epi-pen, benadryl, MD/ER evaluation for allergic reaction  D. Blood -hymenoptera venom IgE panel  3. Immunotherapy???

## 2023-02-21 ENCOUNTER — Encounter: Payer: Self-pay | Admitting: Allergy and Immunology

## 2023-02-26 LAB — HYMENOPTERA VENOM ALLERGY PROF
I001-IgE Honeybee: <0.1 kU/L
I003-IgE Yellow Jacket: 27.6 kU/L — AB
I208-IgE Api m 1: <0.1 kU/L
I209-IgE Ves v 5: 2.97 kU/L — AB
I210-IgE Pol d 5: 8.02 kU/L — AB
I211-IgE Ves v 1: 4.14 kU/L — AB
I211-IgE Ves v 1: 32.5 kU/L — AB
I214-IgE Api m 2: <0.1 kU/L
I215-IgE Api m 3: <0.1 kU/L
I216-IgE Api m 5: <0.1 kU/L
I217-IgE Api m 10: <0.1 kU/L
Tryptase: 4.9 ug/L (ref 2.2–13.2)

## 2023-02-26 LAB — ALLERGEN COMPONENT COMMENTS

## 2023-03-02 ENCOUNTER — Telehealth: Payer: Self-pay

## 2023-03-02 NOTE — Telephone Encounter (Signed)
-----   Message from Gypsy Balsam sent at 02/15/2023  9:02 AM EDT ----- Echocardiogram showed normal left ventricle ejection fraction mild mitral valve regurgitation, overall looks good

## 2023-03-02 NOTE — Telephone Encounter (Signed)
Patient notified of results and verbalized understanding.  

## 2023-03-22 DIAGNOSIS — R490 Dysphonia: Secondary | ICD-10-CM | POA: Diagnosis not present

## 2023-03-22 DIAGNOSIS — Z923 Personal history of irradiation: Secondary | ICD-10-CM | POA: Diagnosis not present

## 2023-03-22 DIAGNOSIS — Z8521 Personal history of malignant neoplasm of larynx: Secondary | ICD-10-CM | POA: Diagnosis not present

## 2023-03-28 ENCOUNTER — Other Ambulatory Visit: Payer: Self-pay | Admitting: Cardiology

## 2023-03-28 DIAGNOSIS — Z86711 Personal history of pulmonary embolism: Secondary | ICD-10-CM

## 2023-03-28 DIAGNOSIS — Z86718 Personal history of other venous thrombosis and embolism: Secondary | ICD-10-CM

## 2023-03-28 NOTE — Telephone Encounter (Signed)
Prescription refill request for Xarelto received.  Indication:pe Last office visit:9/24 Weight:111.7  kg Age:72 Scr:1.24  9/24 CrCl:82.57  ml/min  Prescription refilled

## 2023-04-27 ENCOUNTER — Other Ambulatory Visit: Payer: Self-pay | Admitting: Cardiology

## 2023-05-04 ENCOUNTER — Other Ambulatory Visit: Payer: Self-pay | Admitting: Allergy and Immunology

## 2023-05-04 DIAGNOSIS — T63481D Toxic effect of venom of other arthropod, accidental (unintentional), subsequent encounter: Secondary | ICD-10-CM

## 2023-05-16 DIAGNOSIS — N35911 Unspecified urethral stricture, male, meatal: Secondary | ICD-10-CM | POA: Diagnosis not present

## 2023-05-16 DIAGNOSIS — I10 Essential (primary) hypertension: Secondary | ICD-10-CM | POA: Diagnosis not present

## 2023-05-16 DIAGNOSIS — C329 Malignant neoplasm of larynx, unspecified: Secondary | ICD-10-CM | POA: Diagnosis not present

## 2023-05-16 DIAGNOSIS — N320 Bladder-neck obstruction: Secondary | ICD-10-CM | POA: Diagnosis not present

## 2023-05-16 DIAGNOSIS — N3041 Irradiation cystitis with hematuria: Secondary | ICD-10-CM | POA: Diagnosis not present

## 2023-05-16 DIAGNOSIS — Z9221 Personal history of antineoplastic chemotherapy: Secondary | ICD-10-CM | POA: Diagnosis not present

## 2023-05-16 DIAGNOSIS — C61 Malignant neoplasm of prostate: Secondary | ICD-10-CM | POA: Diagnosis not present

## 2023-05-16 DIAGNOSIS — R310 Gross hematuria: Secondary | ICD-10-CM | POA: Diagnosis not present

## 2023-05-17 DIAGNOSIS — N320 Bladder-neck obstruction: Secondary | ICD-10-CM | POA: Diagnosis not present

## 2023-05-17 DIAGNOSIS — N3040 Irradiation cystitis without hematuria: Secondary | ICD-10-CM | POA: Diagnosis not present

## 2023-05-17 DIAGNOSIS — N35911 Unspecified urethral stricture, male, meatal: Secondary | ICD-10-CM | POA: Diagnosis not present

## 2023-05-17 DIAGNOSIS — Z9221 Personal history of antineoplastic chemotherapy: Secondary | ICD-10-CM | POA: Diagnosis not present

## 2023-05-17 DIAGNOSIS — R310 Gross hematuria: Secondary | ICD-10-CM | POA: Diagnosis not present

## 2023-05-24 DIAGNOSIS — Z79818 Long term (current) use of other agents affecting estrogen receptors and estrogen levels: Secondary | ICD-10-CM | POA: Diagnosis not present

## 2023-05-24 DIAGNOSIS — R310 Gross hematuria: Secondary | ICD-10-CM | POA: Diagnosis not present

## 2023-05-24 DIAGNOSIS — R339 Retention of urine, unspecified: Secondary | ICD-10-CM | POA: Diagnosis not present

## 2023-05-24 DIAGNOSIS — N401 Enlarged prostate with lower urinary tract symptoms: Secondary | ICD-10-CM | POA: Diagnosis not present

## 2023-05-24 DIAGNOSIS — C61 Malignant neoplasm of prostate: Secondary | ICD-10-CM | POA: Diagnosis not present

## 2023-05-24 DIAGNOSIS — N138 Other obstructive and reflux uropathy: Secondary | ICD-10-CM | POA: Diagnosis not present

## 2023-05-25 ENCOUNTER — Ambulatory Visit (INDEPENDENT_AMBULATORY_CARE_PROVIDER_SITE_OTHER): Payer: Medicare HMO

## 2023-05-25 DIAGNOSIS — T63481D Toxic effect of venom of other arthropod, accidental (unintentional), subsequent encounter: Secondary | ICD-10-CM

## 2023-05-25 DIAGNOSIS — T782XXD Anaphylactic shock, unspecified, subsequent encounter: Secondary | ICD-10-CM

## 2023-05-25 NOTE — Progress Notes (Signed)
 Immunotherapy   Patient Details  Name: Millie Shorb MRN: [id number] Date of Birth: 1950-06-29  05/25/2023  Dorean Blahut started injections for  wasp and yellow jacket.  Frequency:1 time per week Epi-Pen:Epi-Pen Available  Consent signed and patient instructions given.   Christipher Rieger 05/25/2023, 9:59 AM

## 2023-06-01 ENCOUNTER — Ambulatory Visit (INDEPENDENT_AMBULATORY_CARE_PROVIDER_SITE_OTHER): Payer: Medicare HMO

## 2023-06-01 DIAGNOSIS — T63481D Toxic effect of venom of other arthropod, accidental (unintentional), subsequent encounter: Secondary | ICD-10-CM

## 2023-06-01 DIAGNOSIS — T782XXD Anaphylactic shock, unspecified, subsequent encounter: Secondary | ICD-10-CM | POA: Diagnosis not present

## 2023-06-01 DIAGNOSIS — C61 Malignant neoplasm of prostate: Secondary | ICD-10-CM | POA: Diagnosis not present

## 2023-06-01 DIAGNOSIS — R310 Gross hematuria: Secondary | ICD-10-CM | POA: Diagnosis not present

## 2023-06-06 ENCOUNTER — Other Ambulatory Visit: Payer: Self-pay | Admitting: Cardiology

## 2023-06-06 NOTE — Telephone Encounter (Signed)
 Prescription sent to pharmacy.

## 2023-06-07 DIAGNOSIS — K429 Umbilical hernia without obstruction or gangrene: Secondary | ICD-10-CM | POA: Diagnosis not present

## 2023-06-07 DIAGNOSIS — K449 Diaphragmatic hernia without obstruction or gangrene: Secondary | ICD-10-CM | POA: Diagnosis not present

## 2023-06-07 DIAGNOSIS — C61 Malignant neoplasm of prostate: Secondary | ICD-10-CM | POA: Diagnosis not present

## 2023-06-07 DIAGNOSIS — R338 Other retention of urine: Secondary | ICD-10-CM | POA: Diagnosis not present

## 2023-06-07 DIAGNOSIS — N401 Enlarged prostate with lower urinary tract symptoms: Secondary | ICD-10-CM | POA: Diagnosis not present

## 2023-06-07 DIAGNOSIS — Z79818 Long term (current) use of other agents affecting estrogen receptors and estrogen levels: Secondary | ICD-10-CM | POA: Diagnosis not present

## 2023-06-07 DIAGNOSIS — N1330 Unspecified hydronephrosis: Secondary | ICD-10-CM | POA: Diagnosis not present

## 2023-06-07 DIAGNOSIS — N2889 Other specified disorders of kidney and ureter: Secondary | ICD-10-CM | POA: Diagnosis not present

## 2023-06-07 DIAGNOSIS — N134 Hydroureter: Secondary | ICD-10-CM | POA: Diagnosis not present

## 2023-06-07 DIAGNOSIS — R399 Unspecified symptoms and signs involving the genitourinary system: Secondary | ICD-10-CM | POA: Insufficient documentation

## 2023-06-07 DIAGNOSIS — R310 Gross hematuria: Secondary | ICD-10-CM | POA: Diagnosis not present

## 2023-06-07 DIAGNOSIS — N138 Other obstructive and reflux uropathy: Secondary | ICD-10-CM | POA: Diagnosis not present

## 2023-06-08 ENCOUNTER — Ambulatory Visit (INDEPENDENT_AMBULATORY_CARE_PROVIDER_SITE_OTHER): Payer: Medicare HMO

## 2023-06-08 DIAGNOSIS — T63481D Toxic effect of venom of other arthropod, accidental (unintentional), subsequent encounter: Secondary | ICD-10-CM

## 2023-06-08 DIAGNOSIS — T782XXD Anaphylactic shock, unspecified, subsequent encounter: Secondary | ICD-10-CM | POA: Diagnosis not present

## 2023-06-11 DIAGNOSIS — Z7901 Long term (current) use of anticoagulants: Secondary | ICD-10-CM | POA: Diagnosis not present

## 2023-06-11 DIAGNOSIS — Z86711 Personal history of pulmonary embolism: Secondary | ICD-10-CM | POA: Diagnosis not present

## 2023-06-11 DIAGNOSIS — N1339 Other hydronephrosis: Secondary | ICD-10-CM | POA: Diagnosis not present

## 2023-06-11 DIAGNOSIS — Z6833 Body mass index (BMI) 33.0-33.9, adult: Secondary | ICD-10-CM | POA: Diagnosis not present

## 2023-06-11 DIAGNOSIS — R310 Gross hematuria: Secondary | ICD-10-CM | POA: Diagnosis not present

## 2023-06-11 DIAGNOSIS — Z86718 Personal history of other venous thrombosis and embolism: Secondary | ICD-10-CM | POA: Diagnosis not present

## 2023-06-11 DIAGNOSIS — I130 Hypertensive heart and chronic kidney disease with heart failure and stage 1 through stage 4 chronic kidney disease, or unspecified chronic kidney disease: Secondary | ICD-10-CM | POA: Diagnosis not present

## 2023-06-11 DIAGNOSIS — M1990 Unspecified osteoarthritis, unspecified site: Secondary | ICD-10-CM | POA: Diagnosis not present

## 2023-06-11 DIAGNOSIS — N179 Acute kidney failure, unspecified: Secondary | ICD-10-CM | POA: Diagnosis not present

## 2023-06-11 DIAGNOSIS — N183 Chronic kidney disease, stage 3 unspecified: Secondary | ICD-10-CM | POA: Diagnosis not present

## 2023-06-11 DIAGNOSIS — I509 Heart failure, unspecified: Secondary | ICD-10-CM | POA: Diagnosis not present

## 2023-06-11 DIAGNOSIS — Z923 Personal history of irradiation: Secondary | ICD-10-CM | POA: Diagnosis not present

## 2023-06-11 DIAGNOSIS — N1330 Unspecified hydronephrosis: Secondary | ICD-10-CM | POA: Diagnosis not present

## 2023-06-11 DIAGNOSIS — J45909 Unspecified asthma, uncomplicated: Secondary | ICD-10-CM | POA: Diagnosis not present

## 2023-06-11 DIAGNOSIS — N131 Hydronephrosis with ureteral stricture, not elsewhere classified: Secondary | ICD-10-CM | POA: Diagnosis not present

## 2023-06-11 DIAGNOSIS — E669 Obesity, unspecified: Secondary | ICD-10-CM | POA: Diagnosis not present

## 2023-06-11 DIAGNOSIS — Z8546 Personal history of malignant neoplasm of prostate: Secondary | ICD-10-CM | POA: Diagnosis not present

## 2023-06-11 DIAGNOSIS — Z8521 Personal history of malignant neoplasm of larynx: Secondary | ICD-10-CM | POA: Diagnosis not present

## 2023-06-11 DIAGNOSIS — C61 Malignant neoplasm of prostate: Secondary | ICD-10-CM | POA: Diagnosis not present

## 2023-06-11 DIAGNOSIS — I10 Essential (primary) hypertension: Secondary | ICD-10-CM | POA: Diagnosis not present

## 2023-06-11 DIAGNOSIS — Z91030 Bee allergy status: Secondary | ICD-10-CM | POA: Diagnosis not present

## 2023-06-12 DIAGNOSIS — C61 Malignant neoplasm of prostate: Secondary | ICD-10-CM | POA: Diagnosis not present

## 2023-06-12 DIAGNOSIS — N179 Acute kidney failure, unspecified: Secondary | ICD-10-CM | POA: Diagnosis not present

## 2023-06-12 DIAGNOSIS — N1330 Unspecified hydronephrosis: Secondary | ICD-10-CM | POA: Diagnosis not present

## 2023-06-13 DIAGNOSIS — N1339 Other hydronephrosis: Secondary | ICD-10-CM | POA: Diagnosis not present

## 2023-06-13 DIAGNOSIS — C61 Malignant neoplasm of prostate: Secondary | ICD-10-CM | POA: Diagnosis not present

## 2023-06-13 DIAGNOSIS — N183 Chronic kidney disease, stage 3 unspecified: Secondary | ICD-10-CM | POA: Diagnosis not present

## 2023-06-13 DIAGNOSIS — N179 Acute kidney failure, unspecified: Secondary | ICD-10-CM | POA: Diagnosis not present

## 2023-06-13 DIAGNOSIS — I10 Essential (primary) hypertension: Secondary | ICD-10-CM | POA: Diagnosis not present

## 2023-06-14 DIAGNOSIS — N1339 Other hydronephrosis: Secondary | ICD-10-CM | POA: Diagnosis not present

## 2023-06-14 DIAGNOSIS — C61 Malignant neoplasm of prostate: Secondary | ICD-10-CM | POA: Diagnosis not present

## 2023-06-14 DIAGNOSIS — N179 Acute kidney failure, unspecified: Secondary | ICD-10-CM | POA: Diagnosis not present

## 2023-06-14 DIAGNOSIS — N183 Chronic kidney disease, stage 3 unspecified: Secondary | ICD-10-CM | POA: Diagnosis not present

## 2023-06-14 DIAGNOSIS — I10 Essential (primary) hypertension: Secondary | ICD-10-CM | POA: Diagnosis not present

## 2023-06-15 ENCOUNTER — Ambulatory Visit: Payer: Medicare HMO

## 2023-06-26 DIAGNOSIS — C678 Malignant neoplasm of overlapping sites of bladder: Secondary | ICD-10-CM | POA: Diagnosis not present

## 2023-06-26 DIAGNOSIS — D62 Acute posthemorrhagic anemia: Secondary | ICD-10-CM | POA: Diagnosis not present

## 2023-06-26 DIAGNOSIS — Y846 Urinary catheterization as the cause of abnormal reaction of the patient, or of later complication, without mention of misadventure at the time of the procedure: Secondary | ICD-10-CM | POA: Diagnosis not present

## 2023-06-26 DIAGNOSIS — N1330 Unspecified hydronephrosis: Secondary | ICD-10-CM | POA: Diagnosis not present

## 2023-06-26 DIAGNOSIS — T45515A Adverse effect of anticoagulants, initial encounter: Secondary | ICD-10-CM | POA: Diagnosis not present

## 2023-06-26 DIAGNOSIS — E669 Obesity, unspecified: Secondary | ICD-10-CM | POA: Diagnosis not present

## 2023-06-26 DIAGNOSIS — R319 Hematuria, unspecified: Secondary | ICD-10-CM | POA: Diagnosis not present

## 2023-06-26 DIAGNOSIS — E875 Hyperkalemia: Secondary | ICD-10-CM | POA: Diagnosis not present

## 2023-06-26 DIAGNOSIS — Z452 Encounter for adjustment and management of vascular access device: Secondary | ICD-10-CM | POA: Diagnosis not present

## 2023-06-26 DIAGNOSIS — I130 Hypertensive heart and chronic kidney disease with heart failure and stage 1 through stage 4 chronic kidney disease, or unspecified chronic kidney disease: Secondary | ICD-10-CM | POA: Diagnosis not present

## 2023-06-26 DIAGNOSIS — R918 Other nonspecific abnormal finding of lung field: Secondary | ICD-10-CM | POA: Diagnosis not present

## 2023-06-26 DIAGNOSIS — I5033 Acute on chronic diastolic (congestive) heart failure: Secondary | ICD-10-CM | POA: Diagnosis not present

## 2023-06-26 DIAGNOSIS — J9811 Atelectasis: Secondary | ICD-10-CM | POA: Diagnosis not present

## 2023-06-26 DIAGNOSIS — Z923 Personal history of irradiation: Secondary | ICD-10-CM | POA: Diagnosis not present

## 2023-06-26 DIAGNOSIS — N179 Acute kidney failure, unspecified: Secondary | ICD-10-CM | POA: Diagnosis not present

## 2023-06-26 DIAGNOSIS — M7989 Other specified soft tissue disorders: Secondary | ICD-10-CM | POA: Diagnosis not present

## 2023-06-26 DIAGNOSIS — C675 Malignant neoplasm of bladder neck: Secondary | ICD-10-CM | POA: Diagnosis not present

## 2023-06-26 DIAGNOSIS — C679 Malignant neoplasm of bladder, unspecified: Secondary | ICD-10-CM | POA: Diagnosis not present

## 2023-06-26 DIAGNOSIS — I3481 Nonrheumatic mitral (valve) annulus calcification: Secondary | ICD-10-CM | POA: Diagnosis not present

## 2023-06-26 DIAGNOSIS — D6832 Hemorrhagic disorder due to extrinsic circulating anticoagulants: Secondary | ICD-10-CM | POA: Diagnosis not present

## 2023-06-26 DIAGNOSIS — I10 Essential (primary) hypertension: Secondary | ICD-10-CM | POA: Diagnosis not present

## 2023-06-26 DIAGNOSIS — Z95828 Presence of other vascular implants and grafts: Secondary | ICD-10-CM | POA: Diagnosis not present

## 2023-06-26 DIAGNOSIS — N183 Chronic kidney disease, stage 3 unspecified: Secondary | ICD-10-CM | POA: Diagnosis not present

## 2023-06-26 DIAGNOSIS — J90 Pleural effusion, not elsewhere classified: Secondary | ICD-10-CM | POA: Diagnosis not present

## 2023-06-26 DIAGNOSIS — N1832 Chronic kidney disease, stage 3b: Secondary | ICD-10-CM | POA: Diagnosis not present

## 2023-06-26 DIAGNOSIS — D494 Neoplasm of unspecified behavior of bladder: Secondary | ICD-10-CM | POA: Diagnosis not present

## 2023-06-26 DIAGNOSIS — C61 Malignant neoplasm of prostate: Secondary | ICD-10-CM | POA: Diagnosis not present

## 2023-06-26 DIAGNOSIS — Z8546 Personal history of malignant neoplasm of prostate: Secondary | ICD-10-CM | POA: Diagnosis not present

## 2023-06-26 DIAGNOSIS — I4891 Unspecified atrial fibrillation: Secondary | ICD-10-CM | POA: Diagnosis not present

## 2023-06-26 DIAGNOSIS — E872 Acidosis, unspecified: Secondary | ICD-10-CM | POA: Diagnosis not present

## 2023-06-26 DIAGNOSIS — N138 Other obstructive and reflux uropathy: Secondary | ICD-10-CM | POA: Diagnosis not present

## 2023-06-26 DIAGNOSIS — Z7901 Long term (current) use of anticoagulants: Secondary | ICD-10-CM | POA: Diagnosis not present

## 2023-06-26 DIAGNOSIS — C7949 Secondary malignant neoplasm of other parts of nervous system: Secondary | ICD-10-CM | POA: Diagnosis not present

## 2023-06-26 DIAGNOSIS — Z86718 Personal history of other venous thrombosis and embolism: Secondary | ICD-10-CM | POA: Diagnosis not present

## 2023-06-26 DIAGNOSIS — N139 Obstructive and reflux uropathy, unspecified: Secondary | ICD-10-CM | POA: Diagnosis not present

## 2023-06-26 DIAGNOSIS — R310 Gross hematuria: Secondary | ICD-10-CM | POA: Diagnosis not present

## 2023-06-26 DIAGNOSIS — I420 Dilated cardiomyopathy: Secondary | ICD-10-CM | POA: Diagnosis not present

## 2023-06-26 DIAGNOSIS — T83091A Other mechanical complication of indwelling urethral catheter, initial encounter: Secondary | ICD-10-CM | POA: Diagnosis not present

## 2023-06-27 DIAGNOSIS — I999 Unspecified disorder of circulatory system: Secondary | ICD-10-CM | POA: Insufficient documentation

## 2023-07-07 DIAGNOSIS — C679 Malignant neoplasm of bladder, unspecified: Secondary | ICD-10-CM | POA: Diagnosis not present

## 2023-07-07 DIAGNOSIS — Z09 Encounter for follow-up examination after completed treatment for conditions other than malignant neoplasm: Secondary | ICD-10-CM | POA: Diagnosis not present

## 2023-07-07 DIAGNOSIS — Z6834 Body mass index (BMI) 34.0-34.9, adult: Secondary | ICD-10-CM | POA: Diagnosis not present

## 2023-07-14 ENCOUNTER — Other Ambulatory Visit: Payer: Self-pay

## 2023-07-14 DIAGNOSIS — C61 Malignant neoplasm of prostate: Secondary | ICD-10-CM | POA: Diagnosis not present

## 2023-07-14 DIAGNOSIS — C679 Malignant neoplasm of bladder, unspecified: Secondary | ICD-10-CM | POA: Diagnosis not present

## 2023-07-14 DIAGNOSIS — D649 Anemia, unspecified: Secondary | ICD-10-CM

## 2023-07-15 ENCOUNTER — Telehealth: Payer: Self-pay | Admitting: Oncology

## 2023-07-15 ENCOUNTER — Other Ambulatory Visit: Payer: Self-pay | Admitting: Oncology

## 2023-07-15 ENCOUNTER — Encounter: Payer: Self-pay | Admitting: Oncology

## 2023-07-15 ENCOUNTER — Inpatient Hospital Stay

## 2023-07-15 ENCOUNTER — Inpatient Hospital Stay: Attending: Oncology | Admitting: Oncology

## 2023-07-15 VITALS — BP 132/64 | HR 58 | Temp 97.8°F | Resp 16 | Ht 71.0 in | Wt 230.4 lb

## 2023-07-15 DIAGNOSIS — C7989 Secondary malignant neoplasm of other specified sites: Secondary | ICD-10-CM | POA: Insufficient documentation

## 2023-07-15 DIAGNOSIS — R310 Gross hematuria: Secondary | ICD-10-CM | POA: Insufficient documentation

## 2023-07-15 DIAGNOSIS — C679 Malignant neoplasm of bladder, unspecified: Secondary | ICD-10-CM | POA: Insufficient documentation

## 2023-07-15 DIAGNOSIS — D649 Anemia, unspecified: Secondary | ICD-10-CM | POA: Diagnosis not present

## 2023-07-15 DIAGNOSIS — D500 Iron deficiency anemia secondary to blood loss (chronic): Secondary | ICD-10-CM

## 2023-07-15 LAB — CBC WITH DIFFERENTIAL (CANCER CENTER ONLY)
Abs Immature Granulocytes: 0.01 10*3/uL (ref 0.00–0.07)
Basophils Absolute: 0 10*3/uL (ref 0.0–0.1)
Basophils Relative: 0 %
Eosinophils Absolute: 0.2 10*3/uL (ref 0.0–0.5)
Eosinophils Relative: 5 %
HCT: 27.2 % — ABNORMAL LOW (ref 39.0–52.0)
Hemoglobin: 9 g/dL — ABNORMAL LOW (ref 13.0–17.0)
Immature Granulocytes: 0 %
Lymphocytes Relative: 19 %
Lymphs Abs: 0.9 10*3/uL (ref 0.7–4.0)
MCH: 31.6 pg (ref 26.0–34.0)
MCHC: 33.1 g/dL (ref 30.0–36.0)
MCV: 95.4 fL (ref 80.0–100.0)
Monocytes Absolute: 0.5 10*3/uL (ref 0.1–1.0)
Monocytes Relative: 10 %
Neutro Abs: 2.9 10*3/uL (ref 1.7–7.7)
Neutrophils Relative %: 66 %
Platelet Count: 170 10*3/uL (ref 150–400)
RBC: 2.85 MIL/uL — ABNORMAL LOW (ref 4.22–5.81)
RDW: 14.3 % (ref 11.5–15.5)
WBC Count: 4.5 10*3/uL (ref 4.0–10.5)
nRBC: 0 % (ref 0.0–0.2)
nRBC: 0 /100{WBCs}

## 2023-07-15 NOTE — Telephone Encounter (Signed)
 07/15/23 Spoke with patient and confirmed next appt

## 2023-07-15 NOTE — Progress Notes (Signed)
 [facility]  [address] [place],  [place]  [address] [phone]  Clinic Day:  07/15/2023  Referring physician: Gaither Juba, MD  HISTORY OF PRESENT ILLNESS:  The patient is a 73 y.o. male  who I was asked to consult upon for normocytic anemia.  Labs earlier this month showed a hemoglobin of 8.4, with an MCV of 90.  Of note, recent labs have shown renal insufficiency, with an elevated creatinine of 1.48.  Furthermore a recent biopsy showed a poorly differentiated bladder carcinoma which showed smooth muscle invasion.  This biopsy was done after he was recently hospitalized for hematuria.  Scans showed hydronephrosis to where bilateral nephrostomy tubes had to be placed.  Outside of his gross hematuria, he denies having other overt forms of blood loss.    PAST MEDICAL HISTORY:   Past Medical History:  Diagnosis Date   Abnormal stress test minimal ischemia involving apex 05/01/2019   Bladder cancer (HCC)    Dilated cardiomyopathy (HCC)    Essential hypertension 12/18/2015   History of DVT (deep vein thrombosis) 04/22/2017   History of pulmonary embolism 12/18/2015   Laryngeal cancer (HCC) squamous cell 02/25/2020   PC (prostate cancer) (HCC) 08/28/2019    PAST SURGICAL HISTORY:   Past Surgical History:  Procedure Laterality Date   CATARACT EXTRACTION Bilateral    INSERTION PROSTATE RADIATION SEED  2003   PROSTATE SURGERY     TONSILLECTOMY     Venacava Filter     WISDOM TOOTH EXTRACTION      CURRENT MEDICATIONS:   Current Outpatient Medications  Medication Sig Dispense Refill   bicalutamide (CASODEX) 50 MG tablet Take 50 mg by mouth daily.     carvedilol (COREG) 12.5 MG tablet Take 1 tablet (12.5 mg total) by mouth 2 (two) times daily. 60 tablet 2   DENOSUMAB [place] Inject 60 mLs into the skin every 6 (six) months.     EPINEPHRINE IJ Inject 0.3 mg as directed as needed (Anaphylaxis).     famotidine (PEPCID) 20 MG tablet Take 1 tablet (20  mg total) by mouth 2 (two) times daily. 180 tablet 3   Leuprolide Acetate (LUPRON IJ) Inject 1 each as directed every 6 (six) months. Unknown strength     loratadine (CLARITIN) 10 MG tablet Take 1 tablet (10 mg total) by mouth 2 (two) times daily. 180 tablet 3   omeprazole (PRILOSEC) 40 MG capsule TAKE 1 CAPSULE BY MOUTH EVERY MORNING (Patient taking differently: Take 40 mg by mouth daily.) 30 capsule 5   sacubitril-valsartan (ENTRESTO) 49-51 MG TAKE 1 TABLET BY MOUTH TWICE DAILY 180 tablet 2   spironolactone (ALDACTONE) 25 MG tablet TAKE 1/2 TABLET BY MOUTH ONCE DAILY. 45 tablet 1   XARELTO 20 MG TABS tablet TAKE 1 TABLET BY MOUTH EVERY DAY WITH SUPPER 90 tablet 1   No current facility-administered medications for this visit.    ALLERGIES:   Allergies  Allergen Reactions   Bee Venom Anaphylaxis    FAMILY HISTORY:   Family History  Problem Relation Age of Onset   Heart failure Mother    Pleurisy Mother    Other Father        UNK MEDICAL HX    SOCIAL HISTORY:  The patient was born in [place], [place].  He lives in town with his wife of 34 years.  He has no children.  He did [employer] for 44 years.  There is no history of smoking.  He intermittently consumes alcohol.  REVIEW OF SYSTEMS:  Review of Systems  Constitutional:  Negative for fatigue, fever and unexpected weight change.  Respiratory:  Negative for chest tightness, cough, hemoptysis and shortness of breath.   Cardiovascular:  Negative for chest pain and palpitations.  Gastrointestinal:  Negative for abdominal distention, abdominal pain, blood in stool, constipation, diarrhea, nausea and vomiting.  Genitourinary:  Positive for difficulty urinating and hematuria. Negative for dysuria and frequency.   Musculoskeletal:  Negative for arthralgias, back pain and myalgias.  Skin:  Negative for itching and rash.  Neurological:  Negative for dizziness, headaches and light-headedness.  Psychiatric/Behavioral:  Negative for  depression and suicidal ideas. The patient is not nervous/anxious.    PHYSICAL EXAM:  Blood pressure 132/64, pulse (!) 58, temperature 97.8 F (36.6 C), temperature source Oral, resp. rate 16, height 5\' 11"  (1.803 m), weight 230 lb 6.4 oz (104.5 kg), SpO2 100%. Wt Readings from Last 3 Encounters:  07/15/23 230 lb 6.4 oz (104.5 kg)  02/17/23 246 lb 3.2 oz (111.7 kg)  01/03/23 249 lb (112.9 kg)   Body mass index is 32.13 kg/m. Performance status (ECOG): 1 - Symptomatic but completely ambulatory Physical Exam Constitutional:      Appearance: Normal appearance. He is not ill-appearing.  HENT:     Mouth/Throat:     Mouth: Mucous membranes are moist.     Pharynx: Oropharynx is clear. No oropharyngeal exudate or posterior oropharyngeal erythema.  Cardiovascular:     Rate and Rhythm: Normal rate and regular rhythm.     Heart sounds: No murmur heard.    No friction rub. No gallop.  Pulmonary:     Effort: Pulmonary effort is normal. No respiratory distress.     Breath sounds: Normal breath sounds. No wheezing, rhonchi or rales.  Abdominal:     General: Bowel sounds are normal. There is no distension.     Palpations: Abdomen is soft. There is no mass.     Tenderness: There is no abdominal tenderness.  Musculoskeletal:        General: No swelling.     Right lower leg: No edema.     Left lower leg: No edema.  Lymphadenopathy:     Cervical: No cervical adenopathy.     Upper Body:     Right upper body: No supraclavicular or axillary adenopathy.     Left upper body: No supraclavicular or axillary adenopathy.     Lower Body: No right inguinal adenopathy. No left inguinal adenopathy.  Skin:    General: Skin is warm.     Coloration: Skin is not jaundiced.     Findings: No lesion or rash.  Neurological:     General: No focal deficit present.     Mental Status: He is alert and oriented to person, place, and time. Mental status is at baseline.  Psychiatric:        Mood and Affect: Mood  normal.        Behavior: Behavior normal.        Thought Content: Thought content normal.   LABS:      Latest Ref Rng & Units 07/15/2023    1:09 PM 04/22/2017   10:23 AM  CBC  WBC 4.0 - 10.5 K/uL 4.5  6.7   Hemoglobin 13.0 - 17.0 g/dL 9.0  46.9   Hematocrit 39.0 - 52.0 % 27.2  39.2   Platelets 150 - 400 K/uL 170  164       Latest Ref Rng & Units 07/05/2019   10:45 AM  06/25/2019    9:09 AM 05/07/2019   11:19 AM  CMP  Glucose 65 - 99 mg/dL 96  89  604   BUN 8 - 27 mg/dL 11  21  21    Creatinine 0.76 - 1.27 mg/dL 5.40  9.81  1.91   Sodium 134 - 144 mmol/L 139  139  138   Potassium 3.5 - 5.2 mmol/L 4.7  4.6  4.6   Chloride 96 - 106 mmol/L 105  105  103   CO2 20 - 29 mmol/L 21  20  20    Calcium 8.6 - 10.2 mg/dL 8.9  9.3  9.5    ASSESSMENT & PLAN:  A 73 y.o. male who I was asked to consult upon for gross hematuria.  As mentioned previously, this gentleman has been diagnosed with muscle invasive bladder, for which he is scheduled to undergo therapy in [place] to treat this.  I feel that IV iron could be given while he is getting treated for his bladder cancer.  He understands his bladder cancer needs to be treated to offset the gross hematuria he has been dealing with for weeks.  I will tentatively see him back in 6 months for repeat clinical assessment.  The patient understands all the plans discussed today and is in agreement with them.  I do appreciate Gaither Juba, MD for his new consult.   Zitlaly Malson Felicia Horde, MD

## 2023-07-17 LAB — SOLUBLE TRANSFERRIN RECEPTOR: Transferrin Receptor: 12.3 nmol/L (ref 12.2–27.3)

## 2023-07-20 DIAGNOSIS — Y828 Other medical devices associated with adverse incidents: Secondary | ICD-10-CM | POA: Diagnosis not present

## 2023-07-20 DIAGNOSIS — K8020 Calculus of gallbladder without cholecystitis without obstruction: Secondary | ICD-10-CM | POA: Diagnosis not present

## 2023-07-20 DIAGNOSIS — N3289 Other specified disorders of bladder: Secondary | ICD-10-CM | POA: Diagnosis not present

## 2023-07-20 DIAGNOSIS — K429 Umbilical hernia without obstruction or gangrene: Secondary | ICD-10-CM | POA: Diagnosis not present

## 2023-07-20 DIAGNOSIS — Z8551 Personal history of malignant neoplasm of bladder: Secondary | ICD-10-CM | POA: Diagnosis not present

## 2023-07-20 DIAGNOSIS — Z8546 Personal history of malignant neoplasm of prostate: Secondary | ICD-10-CM | POA: Diagnosis not present

## 2023-07-20 DIAGNOSIS — T83092A Other mechanical complication of nephrostomy catheter, initial encounter: Secondary | ICD-10-CM | POA: Diagnosis not present

## 2023-07-20 DIAGNOSIS — T83098A Other mechanical complication of other indwelling urethral catheter, initial encounter: Secondary | ICD-10-CM | POA: Diagnosis not present

## 2023-07-20 DIAGNOSIS — R319 Hematuria, unspecified: Secondary | ICD-10-CM | POA: Diagnosis not present

## 2023-07-21 DIAGNOSIS — C775 Secondary and unspecified malignant neoplasm of intrapelvic lymph nodes: Secondary | ICD-10-CM | POA: Diagnosis not present

## 2023-07-21 DIAGNOSIS — R310 Gross hematuria: Secondary | ICD-10-CM | POA: Diagnosis not present

## 2023-07-21 DIAGNOSIS — Z79818 Long term (current) use of other agents affecting estrogen receptors and estrogen levels: Secondary | ICD-10-CM | POA: Diagnosis not present

## 2023-07-21 DIAGNOSIS — N1339 Other hydronephrosis: Secondary | ICD-10-CM | POA: Diagnosis not present

## 2023-07-21 DIAGNOSIS — C7A1 Malignant poorly differentiated neuroendocrine tumors: Secondary | ICD-10-CM | POA: Diagnosis not present

## 2023-07-21 DIAGNOSIS — C61 Malignant neoplasm of prostate: Secondary | ICD-10-CM | POA: Diagnosis not present

## 2023-07-26 DIAGNOSIS — C61 Malignant neoplasm of prostate: Secondary | ICD-10-CM | POA: Diagnosis not present

## 2023-07-26 DIAGNOSIS — Z86718 Personal history of other venous thrombosis and embolism: Secondary | ICD-10-CM | POA: Diagnosis not present

## 2023-07-26 DIAGNOSIS — N1339 Other hydronephrosis: Secondary | ICD-10-CM | POA: Diagnosis not present

## 2023-07-26 DIAGNOSIS — Z7901 Long term (current) use of anticoagulants: Secondary | ICD-10-CM | POA: Diagnosis not present

## 2023-07-26 DIAGNOSIS — N1330 Unspecified hydronephrosis: Secondary | ICD-10-CM | POA: Diagnosis not present

## 2023-07-26 DIAGNOSIS — C675 Malignant neoplasm of bladder neck: Secondary | ICD-10-CM | POA: Diagnosis not present

## 2023-07-26 DIAGNOSIS — Z79899 Other long term (current) drug therapy: Secondary | ICD-10-CM | POA: Diagnosis not present

## 2023-07-26 DIAGNOSIS — Z86711 Personal history of pulmonary embolism: Secondary | ICD-10-CM | POA: Diagnosis not present

## 2023-07-26 DIAGNOSIS — C679 Malignant neoplasm of bladder, unspecified: Secondary | ICD-10-CM | POA: Diagnosis not present

## 2023-07-27 ENCOUNTER — Other Ambulatory Visit: Payer: Self-pay

## 2023-07-27 MED ORDER — FAMOTIDINE 20 MG PO TABS: 20.0000 mg | ORAL_TABLET | Freq: Two times a day (BID) | ORAL | 3 refills | Status: DC

## 2023-07-27 MED ORDER — LORATADINE 10 MG PO TABS: 10.0000 mg | ORAL_TABLET | Freq: Two times a day (BID) | ORAL | 3 refills | Status: DC

## 2023-08-03 DIAGNOSIS — C679 Malignant neoplasm of bladder, unspecified: Secondary | ICD-10-CM | POA: Diagnosis not present

## 2023-08-03 DIAGNOSIS — D500 Iron deficiency anemia secondary to blood loss (chronic): Secondary | ICD-10-CM | POA: Insufficient documentation

## 2023-08-08 DIAGNOSIS — C679 Malignant neoplasm of bladder, unspecified: Secondary | ICD-10-CM | POA: Diagnosis not present

## 2023-08-08 DIAGNOSIS — D649 Anemia, unspecified: Secondary | ICD-10-CM | POA: Diagnosis not present

## 2023-08-08 DIAGNOSIS — Z5111 Encounter for antineoplastic chemotherapy: Secondary | ICD-10-CM | POA: Insufficient documentation

## 2023-08-09 DIAGNOSIS — E538 Deficiency of other specified B group vitamins: Secondary | ICD-10-CM | POA: Insufficient documentation

## 2023-08-10 DIAGNOSIS — E538 Deficiency of other specified B group vitamins: Secondary | ICD-10-CM | POA: Diagnosis not present

## 2023-08-10 DIAGNOSIS — D649 Anemia, unspecified: Secondary | ICD-10-CM | POA: Diagnosis not present

## 2023-08-10 DIAGNOSIS — C679 Malignant neoplasm of bladder, unspecified: Secondary | ICD-10-CM | POA: Diagnosis not present

## 2023-08-10 DIAGNOSIS — Z5111 Encounter for antineoplastic chemotherapy: Secondary | ICD-10-CM | POA: Diagnosis not present

## 2023-08-10 DIAGNOSIS — Z5112 Encounter for antineoplastic immunotherapy: Secondary | ICD-10-CM | POA: Diagnosis not present

## 2023-08-11 DIAGNOSIS — E538 Deficiency of other specified B group vitamins: Secondary | ICD-10-CM | POA: Diagnosis not present

## 2023-08-11 DIAGNOSIS — Z5111 Encounter for antineoplastic chemotherapy: Secondary | ICD-10-CM | POA: Diagnosis not present

## 2023-08-11 DIAGNOSIS — D649 Anemia, unspecified: Secondary | ICD-10-CM | POA: Diagnosis not present

## 2023-08-11 DIAGNOSIS — C679 Malignant neoplasm of bladder, unspecified: Secondary | ICD-10-CM | POA: Diagnosis not present

## 2023-08-11 DIAGNOSIS — Z5112 Encounter for antineoplastic immunotherapy: Secondary | ICD-10-CM | POA: Diagnosis not present

## 2023-08-12 DIAGNOSIS — Z5112 Encounter for antineoplastic immunotherapy: Secondary | ICD-10-CM | POA: Diagnosis not present

## 2023-08-12 DIAGNOSIS — E538 Deficiency of other specified B group vitamins: Secondary | ICD-10-CM | POA: Diagnosis not present

## 2023-08-12 DIAGNOSIS — C679 Malignant neoplasm of bladder, unspecified: Secondary | ICD-10-CM | POA: Diagnosis not present

## 2023-08-12 DIAGNOSIS — Z5111 Encounter for antineoplastic chemotherapy: Secondary | ICD-10-CM | POA: Diagnosis not present

## 2023-08-12 DIAGNOSIS — D649 Anemia, unspecified: Secondary | ICD-10-CM | POA: Diagnosis not present

## 2023-08-15 DIAGNOSIS — C679 Malignant neoplasm of bladder, unspecified: Secondary | ICD-10-CM | POA: Diagnosis not present

## 2023-08-22 DIAGNOSIS — C679 Malignant neoplasm of bladder, unspecified: Secondary | ICD-10-CM | POA: Diagnosis not present

## 2023-08-22 DIAGNOSIS — D696 Thrombocytopenia, unspecified: Secondary | ICD-10-CM | POA: Insufficient documentation

## 2023-08-24 DIAGNOSIS — C679 Malignant neoplasm of bladder, unspecified: Secondary | ICD-10-CM | POA: Diagnosis not present

## 2023-08-29 DIAGNOSIS — N1339 Other hydronephrosis: Secondary | ICD-10-CM | POA: Diagnosis not present

## 2023-08-29 DIAGNOSIS — Z436 Encounter for attention to other artificial openings of urinary tract: Secondary | ICD-10-CM | POA: Diagnosis not present

## 2023-08-29 DIAGNOSIS — C61 Malignant neoplasm of prostate: Secondary | ICD-10-CM | POA: Diagnosis not present

## 2023-09-06 DIAGNOSIS — M7989 Other specified soft tissue disorders: Secondary | ICD-10-CM | POA: Diagnosis not present

## 2023-09-06 DIAGNOSIS — C679 Malignant neoplasm of bladder, unspecified: Secondary | ICD-10-CM | POA: Diagnosis not present

## 2023-09-06 DIAGNOSIS — Z5111 Encounter for antineoplastic chemotherapy: Secondary | ICD-10-CM | POA: Diagnosis not present

## 2023-09-06 DIAGNOSIS — M79661 Pain in right lower leg: Secondary | ICD-10-CM | POA: Diagnosis not present

## 2023-09-07 DIAGNOSIS — M79661 Pain in right lower leg: Secondary | ICD-10-CM | POA: Diagnosis not present

## 2023-09-07 DIAGNOSIS — I82431 Acute embolism and thrombosis of right popliteal vein: Secondary | ICD-10-CM | POA: Diagnosis not present

## 2023-09-07 DIAGNOSIS — M7989 Other specified soft tissue disorders: Secondary | ICD-10-CM | POA: Diagnosis not present

## 2023-09-07 DIAGNOSIS — I82411 Acute embolism and thrombosis of right femoral vein: Secondary | ICD-10-CM | POA: Diagnosis not present

## 2023-09-07 DIAGNOSIS — I82412 Acute embolism and thrombosis of left femoral vein: Secondary | ICD-10-CM | POA: Diagnosis not present

## 2023-09-07 DIAGNOSIS — M79604 Pain in right leg: Secondary | ICD-10-CM | POA: Diagnosis not present

## 2023-09-07 DIAGNOSIS — C679 Malignant neoplasm of bladder, unspecified: Secondary | ICD-10-CM | POA: Diagnosis not present

## 2023-09-07 DIAGNOSIS — I82441 Acute embolism and thrombosis of right tibial vein: Secondary | ICD-10-CM | POA: Diagnosis not present

## 2023-09-08 DIAGNOSIS — C679 Malignant neoplasm of bladder, unspecified: Secondary | ICD-10-CM | POA: Diagnosis not present

## 2023-09-13 DIAGNOSIS — Z79899 Other long term (current) drug therapy: Secondary | ICD-10-CM | POA: Diagnosis not present

## 2023-09-13 DIAGNOSIS — D696 Thrombocytopenia, unspecified: Secondary | ICD-10-CM | POA: Diagnosis not present

## 2023-09-13 DIAGNOSIS — C679 Malignant neoplasm of bladder, unspecified: Secondary | ICD-10-CM | POA: Diagnosis not present

## 2023-09-13 DIAGNOSIS — E538 Deficiency of other specified B group vitamins: Secondary | ICD-10-CM | POA: Diagnosis not present

## 2023-09-13 DIAGNOSIS — M79661 Pain in right lower leg: Secondary | ICD-10-CM | POA: Diagnosis not present

## 2023-09-13 DIAGNOSIS — M7989 Other specified soft tissue disorders: Secondary | ICD-10-CM | POA: Diagnosis not present

## 2023-09-13 DIAGNOSIS — Z5111 Encounter for antineoplastic chemotherapy: Secondary | ICD-10-CM | POA: Diagnosis not present

## 2023-09-13 DIAGNOSIS — Z5112 Encounter for antineoplastic immunotherapy: Secondary | ICD-10-CM | POA: Diagnosis not present

## 2023-09-13 DIAGNOSIS — D649 Anemia, unspecified: Secondary | ICD-10-CM | POA: Diagnosis not present

## 2023-09-27 DIAGNOSIS — Z5111 Encounter for antineoplastic chemotherapy: Secondary | ICD-10-CM | POA: Diagnosis not present

## 2023-09-27 DIAGNOSIS — C679 Malignant neoplasm of bladder, unspecified: Secondary | ICD-10-CM | POA: Diagnosis not present

## 2023-09-27 DIAGNOSIS — D630 Anemia in neoplastic disease: Secondary | ICD-10-CM | POA: Diagnosis not present

## 2023-09-27 DIAGNOSIS — Z5112 Encounter for antineoplastic immunotherapy: Secondary | ICD-10-CM | POA: Diagnosis not present

## 2023-09-27 DIAGNOSIS — D696 Thrombocytopenia, unspecified: Secondary | ICD-10-CM | POA: Diagnosis not present

## 2023-09-27 DIAGNOSIS — D649 Anemia, unspecified: Secondary | ICD-10-CM | POA: Diagnosis not present

## 2023-10-04 DIAGNOSIS — D649 Anemia, unspecified: Secondary | ICD-10-CM | POA: Diagnosis not present

## 2023-10-04 DIAGNOSIS — C679 Malignant neoplasm of bladder, unspecified: Secondary | ICD-10-CM | POA: Diagnosis not present

## 2023-10-04 DIAGNOSIS — D696 Thrombocytopenia, unspecified: Secondary | ICD-10-CM | POA: Diagnosis not present

## 2023-10-04 DIAGNOSIS — Z5112 Encounter for antineoplastic immunotherapy: Secondary | ICD-10-CM | POA: Diagnosis not present

## 2023-10-04 DIAGNOSIS — Z5111 Encounter for antineoplastic chemotherapy: Secondary | ICD-10-CM | POA: Diagnosis not present

## 2023-10-04 DIAGNOSIS — D630 Anemia in neoplastic disease: Secondary | ICD-10-CM | POA: Diagnosis not present

## 2023-10-11 DIAGNOSIS — D630 Anemia in neoplastic disease: Secondary | ICD-10-CM | POA: Diagnosis not present

## 2023-10-11 DIAGNOSIS — C679 Malignant neoplasm of bladder, unspecified: Secondary | ICD-10-CM | POA: Diagnosis not present

## 2023-10-11 DIAGNOSIS — D649 Anemia, unspecified: Secondary | ICD-10-CM | POA: Diagnosis not present

## 2023-10-11 DIAGNOSIS — Z5111 Encounter for antineoplastic chemotherapy: Secondary | ICD-10-CM | POA: Diagnosis not present

## 2023-10-11 DIAGNOSIS — Z5112 Encounter for antineoplastic immunotherapy: Secondary | ICD-10-CM | POA: Diagnosis not present

## 2023-10-11 DIAGNOSIS — D696 Thrombocytopenia, unspecified: Secondary | ICD-10-CM | POA: Diagnosis not present

## 2023-10-17 DIAGNOSIS — C679 Malignant neoplasm of bladder, unspecified: Secondary | ICD-10-CM | POA: Diagnosis not present

## 2023-10-17 DIAGNOSIS — D696 Thrombocytopenia, unspecified: Secondary | ICD-10-CM | POA: Diagnosis not present

## 2023-10-24 DIAGNOSIS — D696 Thrombocytopenia, unspecified: Secondary | ICD-10-CM | POA: Diagnosis not present

## 2023-10-24 DIAGNOSIS — D649 Anemia, unspecified: Secondary | ICD-10-CM | POA: Diagnosis not present

## 2023-10-24 DIAGNOSIS — C679 Malignant neoplasm of bladder, unspecified: Secondary | ICD-10-CM | POA: Diagnosis not present

## 2023-10-24 DIAGNOSIS — Z5111 Encounter for antineoplastic chemotherapy: Secondary | ICD-10-CM | POA: Diagnosis not present

## 2023-10-25 DIAGNOSIS — Z436 Encounter for attention to other artificial openings of urinary tract: Secondary | ICD-10-CM | POA: Diagnosis not present

## 2023-10-25 DIAGNOSIS — N1339 Other hydronephrosis: Secondary | ICD-10-CM | POA: Diagnosis not present

## 2023-10-25 DIAGNOSIS — C61 Malignant neoplasm of prostate: Secondary | ICD-10-CM | POA: Diagnosis not present

## 2023-10-28 DIAGNOSIS — K8020 Calculus of gallbladder without cholecystitis without obstruction: Secondary | ICD-10-CM | POA: Diagnosis not present

## 2023-10-28 DIAGNOSIS — I700 Atherosclerosis of aorta: Secondary | ICD-10-CM | POA: Diagnosis not present

## 2023-10-28 DIAGNOSIS — C679 Malignant neoplasm of bladder, unspecified: Secondary | ICD-10-CM | POA: Diagnosis not present

## 2023-11-02 DIAGNOSIS — D649 Anemia, unspecified: Secondary | ICD-10-CM | POA: Diagnosis not present

## 2023-11-02 DIAGNOSIS — C679 Malignant neoplasm of bladder, unspecified: Secondary | ICD-10-CM | POA: Diagnosis not present

## 2023-11-02 DIAGNOSIS — R9389 Abnormal findings on diagnostic imaging of other specified body structures: Secondary | ICD-10-CM | POA: Diagnosis not present

## 2023-11-02 DIAGNOSIS — E538 Deficiency of other specified B group vitamins: Secondary | ICD-10-CM | POA: Diagnosis not present

## 2023-11-02 DIAGNOSIS — Z5111 Encounter for antineoplastic chemotherapy: Secondary | ICD-10-CM | POA: Diagnosis not present

## 2023-11-03 DIAGNOSIS — D696 Thrombocytopenia, unspecified: Secondary | ICD-10-CM | POA: Diagnosis not present

## 2023-11-03 DIAGNOSIS — C679 Malignant neoplasm of bladder, unspecified: Secondary | ICD-10-CM | POA: Diagnosis not present

## 2023-11-03 DIAGNOSIS — D630 Anemia in neoplastic disease: Secondary | ICD-10-CM | POA: Diagnosis not present

## 2023-11-03 DIAGNOSIS — Z5111 Encounter for antineoplastic chemotherapy: Secondary | ICD-10-CM | POA: Diagnosis not present

## 2023-11-03 DIAGNOSIS — D649 Anemia, unspecified: Secondary | ICD-10-CM | POA: Diagnosis not present

## 2023-11-08 DIAGNOSIS — M47814 Spondylosis without myelopathy or radiculopathy, thoracic region: Secondary | ICD-10-CM | POA: Diagnosis not present

## 2023-11-08 DIAGNOSIS — M48061 Spinal stenosis, lumbar region without neurogenic claudication: Secondary | ICD-10-CM | POA: Diagnosis not present

## 2023-11-08 DIAGNOSIS — M4804 Spinal stenosis, thoracic region: Secondary | ICD-10-CM | POA: Diagnosis not present

## 2023-11-08 DIAGNOSIS — M5124 Other intervertebral disc displacement, thoracic region: Secondary | ICD-10-CM | POA: Diagnosis not present

## 2023-11-08 DIAGNOSIS — M4814 Ankylosing hyperostosis [Forestier], thoracic region: Secondary | ICD-10-CM | POA: Diagnosis not present

## 2023-11-08 DIAGNOSIS — C679 Malignant neoplasm of bladder, unspecified: Secondary | ICD-10-CM | POA: Diagnosis not present

## 2023-11-08 DIAGNOSIS — M898X8 Other specified disorders of bone, other site: Secondary | ICD-10-CM | POA: Diagnosis not present

## 2023-11-08 DIAGNOSIS — M533 Sacrococcygeal disorders, not elsewhere classified: Secondary | ICD-10-CM | POA: Diagnosis not present

## 2023-11-08 DIAGNOSIS — M47816 Spondylosis without myelopathy or radiculopathy, lumbar region: Secondary | ICD-10-CM | POA: Diagnosis not present

## 2023-11-08 DIAGNOSIS — R9389 Abnormal findings on diagnostic imaging of other specified body structures: Secondary | ICD-10-CM | POA: Diagnosis not present

## 2023-11-08 NOTE — Progress Notes (Addendum)
 Hematology/Oncology Follow-up Visit  Patient Name:  Allen Macdonald Date of Birth:  [birth year] Date of Encounter:  11/11/2023  Referring Provider:  Evern Maria Re*, [phone]   PCP:  Marcellus GORMAN Baptist, MD  Diagnoses:  1. Cancer of the bladder, poorly differentiated. Presumed poorly differentiated bladder cancer. Initially cT3N0M0. Now Stage IV, metastatic to the bone by MRI of the spine on 11/08/2023.   2. H/o prostate cancer, Gleason score (3+3=6). S/p brachytherapy in 2003. On ADT.   3. H/o laryngeal cancer in 2021. Stage I (T1 N0 M0) laryngeal cancer. S/p radiation therapy.   4. H/o DVT/PE   5. Anemia, B12 deficiency  6. Relapsed  Prostate Cancer  to lymph nodes  2016; initially Brachytherapy 2003 for localized disease;was started on Lupron  2016 and has been PSA 0 since    BTMB 2 mt/Mb  MSI High Not Detected  Tumor Fraction 7.9%   Variant of Unknown Significance  ATM (C2801Y) [6.20%]  ATRX (E1279K) [7.90%]  NTRK3 (E784Q) [4.80%]   CHIP  Pathogenic  TET2 (K1491*) [0.70%]  TET2 (Q913*) [0.40%]  TET2 (D411*) [2.50%]  POTENTIAL CLINICAL TRIALS  No molecular matches to active studies from these results   Treatment for bladder cancer:  1. Imfinzi/Split dose Cisplatin 35 mg/m2 on d1 and d8/Gemcitabine 1,000 mg/m2 on d1 and d8 x 4 cycles -surgery cystectomy- followed by maintenance Imfinzi x 8 cycles  Cycle 1: Day 1 - 08/08/2023, Day 8 - 08/15/2023   I reduced dose of Gemcitabine by 50% on 08/24/2023 due to severe thrombocytopenia and bleeding, which led to drop in hemoglobin.  Cycle 2: Day 1 - 09/06/2023, Day 8 - 09/13/2023 (Gemzar alone. Held Cisplatin for rising creatinine and developing hearing loss). Switched from Cisplatin to Carboplatin.   Imfinzi/Carboplatin/Gemcitabine 500 mg/m2 on d1 and d8  Held on 09/27/2023 due to dehydration, neutropenia, and rapid falling hemoglobin with symptoms.  Cycle 3: Day 1 - 10/04/2023, Day 8 - 10/11/2023    Treatment for B12  deficiency: 155 on 08/08/2023 - Started oral B12  - IM B12 1,000 mcg: 08/10/2023, 08/11/2023, 08/12/2023, 08/15/2023   Initial Hematologic/Oncologic History: 07/26/2023  Allen Macdonald is a 73 y.o. male who was seen in consultation at the request of Evern Maria Re* for an evaluation of bladder cancer.   He has a history of PE and DVT and was on Xarelto . According to cardiologist Dr. Karry note on 12/18/2015, he had a DVT and PE in 2014, which was caused by travels and sedentary life style. He apparently had some hematologic work-up at the time which was negative. He was on anticoagulants for 6 months then discontinued. Doppler on 04/22/2017 found an acute DVT involving the left femoral vein, left popliteal vein, and left peroneal veins. He was started on Xaretlo by Dr. Bernie. Recent doppler on 06/27/2023 showed a thrombus in the right GSV below the knee that appeared chronic.   CT urogram on 06/07/2023 showed mild bilateral hydronephrosis and hydroureter to the bilateral ureterovesical junctions, which appeared distended. No calculi or other obstruction visible. Findings suggest obstruction due to bladder wall edema. Urology planned to do a cystoscopy.   Patient was admitted from 06/11/2023 - 06/16/2023 with complaint of hematuria. Xarelto  was held and he had a Foley catheter placed. He had mild worsening of his kidney function, which was suspected to be secondary to IV contrast from CT urogram and bilateral ureteral obstruction. He was given IV fluids.   He presented to the ED on 06/26/2022 again with hematuria.  According to his wife, he began having hematuria on 06/18/2023 and they discussed with his urologist; he was advised to hold Xarelto  and the hematuria resolved. He restarted Xarelto  on 06/22/2023, then began experiencing hematuria again on 06/26/2023.   S/p cystoscopy with clot evacuation and TURBT on 06/27/2023. Tissue exam revealed poorly-differentiated carcinoma, invading smooth  muscle. The pattern of staining was nonspecific and could not differentiate between urothelial carcinoma versus prostatic adenocarcinoma since tumor cells are nonreactive for all the above antibodies.   Had percutaneous nephrostomy tubes placed on 06/29/2023. His creatinine improved afterwards.   CT of the chest on 07/01/2023 found no evidence of prostate cancer metastasis in the thorax. There was a 15 mm ground-glass nodule in the right upper lobe.   CT of the abdomen and pelvis on 07/20/2023 showed asymmetric left bladder wall thickening of the nondistended bladder.   He has an appointment with Dr. Evern on 09/08/2023 to discuss cystoprostatectomy.   Of note, he has a history of prostate cancer. According to Dr. Donnise note on 05/24/2023, he was diagnosed with prostate cancer, Gleason score (3+3=6). S/p brachytherapy in 2003. He had rising PSA in 2014 and 2016, but had negative prostate biopsies. He was found to have recurrence in a left lymph node in 2016 and was started on ADT. PSA was 28 prior to ADT. Last PSA was <0.01 on 06/01/2023. He is currently still receiving Lupron  through urology. Last Lupron  on 07/21/2023.   He also has a history of head and neck cancer. PET scan on 02/20/2020 showed intense metabolic activity in the anterior commissure of the larynx consistent with head and neck cancer. No evidence of metastatic adenopathy in the neck or distant metastatic disease. According to Dr. Trudi note on 02/25/2020, he had Stage I (T1 N0 M0) laryngeal cancer. As there was no evidence of metastatic disease, he did not warrant concurrent chemotherapy. He underwent radiation therapy.   Patient is doing good today. He is accompanied by his wife today. Patient reports that he worked with [employer] his entire life. He currently lives in [place].    He denies neuropathy in his hands or feet. Patient reports that he occasionally urinates through his urethra, but not often.    Patient reports he went  into anaphylactic shock in September when he was stung by a bee.    Interim Note:  Allen Macdonald returns today for follow up of bladder cancer. He is accompanied by his wife. Discussed recent scan with patient and his family. He is feeling better today, since receiving a blood transfusion. He noticed some blood in his right nephrostomy bag this morning, but he believes this is due to him accidentally tugging on the right nephrostomy tube when adjusting the placement of his nephrostomy bag.    Review of Systems: All other systems are negative.  Current Outpatient Medications:  .  amLODIPine  (NORVASC ) 5 mg tablet, Take 1 tablet (5 mg total) by mouth daily., Disp: 30 tablet, Rfl: 0 .  apixaban  (ELIQUIS ) 2.5 mg tab, Take 1 tablet (2.5 mg total) by mouth 2 (two) times a day., Disp: 60 tablet, Rfl: 11 .  bicalutamide  (CASODEX ) 50 mg tablet, Take 50 mg by mouth Once Daily., Disp: , Rfl:  .  calcium  carbonate-vitamin D3 600 mg-20 mcg (800 unit) tab, Take 1 tablet by mouth daily., Disp: , Rfl:  .  carvediloL  (COREG ) 12.5 mg tablet, Take 12.5 mg by mouth in the morning and 12.5 mg in the evening. Take with meals., Disp: ,  Rfl:  .  cyanocobalamin (VITAMIN B12) 1,000 mcg tablet, Take 1,000 mcg by mouth daily., Disp: , Rfl:  .  denosumab  (Prolia ) 60 mg/mL syrg syringe, Inject 60 mg under the skin every 6 (six) months., Disp: , Rfl:  .  dexAMETHasone  (Decadron ) 4 mg tablet, Take 1 tablet (4 mg total) by mouth in the morning and 1 tablet (4 mg total) in the evening. Take with meals. Take prescribed dose on the 3 days AFTER chemotherapy infusion treatment.., Disp: 36 tablet, Rfl: 1 .  EPINEPHrine (SYMJEPI) 0.3 mg/0.3 mL injection syringe, Inject 1 Syringe into the thigh as needed for anaphylaxis., Disp: , Rfl:  .  famotidine  (PEPCID ) 20 mg tablet, Take 1 tablet (20 mg total) by mouth 2 (two) times a day., Disp: 180 tablet, Rfl: 0 .  leuprolide  acetate, 6 month, (Lupron  Depot, 6 Month,) 45 mg sykt  injection, Inject 45 mg into the muscle every 6 (six) months., Disp: , Rfl:  .  loratadine  (CLARITIN ) 10 mg tablet, Take 1 tablet (10 mg total) by mouth daily., Disp: 90 tablet, Rfl: 0 .  magnesium oxide 400 mg (241 mg magnesium) tab, Take 400 mg by mouth 2 (two) times a day., Disp: , Rfl:  .  OLANZapine (ZyPREXA) 5 mg tablet, Take 1 tablet (5 mg total) by mouth at bedtime. Take prescribed dose on the 3 days AFTER chemotherapy infusion treatment., Disp: 30 tablet, Rfl: 1 .  omeprazole  (PriLOSEC) 40 mg DR capsule, Take 40 mg by mouth., Disp: , Rfl:  .  ondansetron  (Zofran ) 8 mg tablet, Take 1 tablet (8 mg total) by mouth every 8 (eight) hours as needed for nausea or vomiting., Disp: 30 tablet, Rfl: 3 .  prochlorperazine  (COMPAZINE ) 10 mg tablet, Take 1 tablet (10 mg total) by mouth every 6 (six) hours as needed for nausea or vomiting., Disp: 30 tablet, Rfl: 3 Past medical history, allergies, current medications, social history and family history were reviewed and updated when appropriate.  Patient Active Problem List  Diagnosis  . Left knee injury  . PC (prostate cancer) (HCC)  . Acquired hallux rigidus, left  . Strain of left foot  . Plantar fasciitis  . Abnormal stress test  . Dilated cardiomyopathy    (CMD)  . Essential hypertension  . History of DVT (deep vein thrombosis)  . Laryngeal cancer    (CMD)  . Personal history of pulmonary embolism  . Abnormal renal finding  . Vascular problem  . Malignant neoplasm of urinary bladder    (CMD)  . Encounter for antineoplastic chemotherapy  . B12 deficiency  . Thrombocytopenia  . Anemia  . Iron deficiency anemia due to chronic blood loss  . Thrombocytopenia    Allergies  Allergen Reactions  . Bee Sting Anaphylaxis  . Yellow Jacket Venom Anaphylaxis    Physical Examination: Vital Signs: BP 148/75 (BP Location: Left arm, Patient Position: Sitting)   Pulse 68   Temp 98.2 F (36.8 C) (Oral)   Resp 17   Wt 102 kg (223 lb 14.4 oz)    SpO2 96%   BMI 31.23 kg/m  General:  Pleasant male in no acute distress.  ECOG: 1/2 HEENT: Normocephalic, atraumatic. PERRLA, EOMI, sclera anicteric.  Nose without discharge.  No thrush or mucositis noted on oropharyngeal examination. Oral mucosa moist.  Pharynx had no tonsillar enlargement or exudates.  Neck supple without masses. No thyromegaly present. Trachea midline. Lymphatic/Immunologic:  No cervical, axillary, or femoral adenopathy.  Cardiovascular:  Regular rate and rhythm, no audible murmurs,  gallops, or rubs.  No JVD noted on examination.    Respiratory:  Chest clear to percussion and auscultation; no respiratory distress.  Gastrointestinal:  Has nephrostomy tubes. Abdomen soft and without tenderness; no hepatosplenomegaly or other masses. No clinical ascites. No rebound or guarding tenderness. Extremities:  No edema or suspicious rashes.  Skin:  No pathologic appearing petechiae or bruising noted.  Neurologic: Alert and oriented to person, place and circumstance.  Strength and sensation are grossly intact.  Cranial nerves III through XII grossly intact. Psychiatric:  Mood and affect are normal. Speech is fluent.  Results: Results for orders placed or performed in visit on 11/11/23  CBC with Differential   Collection Time: 11/11/23  8:44 AM  Result Value Ref Range   WBC 6.26 4.40 - 11.00 10*3/uL   RBC 3.45 (L) 4.50 - 5.90 10*6/uL   Hemoglobin 10.1 (L) 14.0 - 17.5 g/dL   Hematocrit 69.3 (L) 58.4 - 50.4 %   Mean Corpuscular Volume (MCV) 88.6 80.0 - 96.0 fL   Mean Corpuscular Hemoglobin (MCH) 29.3 27.5 - 33.2 pg   Mean Corpuscular Hemoglobin Conc (MCHC) 33.1 33.0 - 37.0 g/dL   Red Cell Distribution Width (RDW) 19.3 (H) 12.3 - 17.0 %   Platelet Count (PLT) 181 150 - 450 10*3/uL   Mean Platelet Volume (MPV) 6.7 (L) 6.8 - 10.2 fL   Neutrophils % 71 %   Lymphocytes % 15 %   Monocytes % 12 %   Eosinophils % 1 %   Basophils % 1 %   Neutrophils Absolute 4.40 1.80 - 7.80 10*3/uL    Lymphocytes # 0.90 (L) 1.00 - 4.80 10*3/uL   Monocytes # 0.80 0.00 - 0.80 10*3/uL   Eosinophils # 0.10 0.00 - 0.50 10*3/uL   Basophils # 0.10 0.00 - 0.20 10*3/uL    TISSUE EXAM: 06/27/2023  Final Diagnosis    BLADDER TUMOR, TRANSURETHRAL RESECTION:                POORLY-DIFFERENTIATED CARCINOMA, INVADING SMOOTH MUSCLE, SEE COMMENT.    Electronically signed by Will Lourdes Balding, MD on 07/01/2023 at 1123 EDT  Comment   Biopsy consists of 1 fragment of fibrous tissue and smooth muscle, infiltrated by malignant cells with marked crush artifact.  Tumor cells are positive for pancytokeratin immunohistochemical stain and are negative for p63, NKX3.1, PSAP and GATA3 immunohistochemical stains.   This pattern of staining is nonspecific and cannot differentiate between urothelial carcinoma versus prostatic adenocarcinoma since tumor cells are nonreactive for all the above antibodies.   This case has been reviewed by Dr Jenkins Glasser who is in essential agreement with the above diagnosis.   Positive controls were reviewed as stained appropriately    CT CHEST ABDOMEN PELVIS WWO CONTRAST: 10/28/2023  Narrative & Impression  CLINICAL DATA:  Malignant neoplasm of urinary bladder. * Tracking Code: BO *   EXAM: CT CHEST WITH CONTRAST   CT ABDOMEN AND PELVIS WITH AND WITHOUT CONTRAST   TECHNIQUE: Multidetector CT imaging of the chest was performed during intravenous contrast administration. Multidetector CT imaging of the abdomen and pelvis was performed following the standard protocol before and during bolus administration of intravenous contrast.   RADIATION DOSE REDUCTION: This exam was performed according to the departmental dose-optimization program which includes automated exposure control, adjustment of the mA and/or kV according to patient size and/or use of iterative reconstruction technique.   CONTRAST:  80 mL iohexoL  (OMNIPAQUE ) 350 mg iodine/mL injection (MDV) 80 mL    COMPARISON:  Multiple  priors including CT July 01, 2023 and CT July 20, 2023   FINDINGS: CT CHEST FINDINGS   Cardiovascular: Right chest Port-A-Cath with tip in the right atrium. Coronary artery calcifications. Aortic atherosclerosis. Normal size heart. No significant pericardial effusion/thickening.   Mediastinum/Nodes: No suspicious thyroid nodule. No pathologically enlarged mediastinal, hilar or axillary lymph nodes. Tiny hiatal hernia.   Lungs/Pleura: Previously seen right upper lobe ground-glass pulmonary nodule has resolved.   New 10 mm right lower lobe ground-glass pulmonary nodule on image 38/9. Adjacent tiny satellite nodules for instance a 3-4 mm nodule on image 38/9.   Musculoskeletal: Multifocal new tiny sclerotic osseous lesions involving the axial and proximal appendicular skeleton for instance the proximal left humerus on image 53/4 and multiple vertebral bodies on image 66/8   CT ABDOMEN AND PELVIS FINDINGS   Hepatobiliary: No suspicious hepatic lesion. Cholelithiasis without findings of acute cholecystitis. No biliary ductal dilation.   Pancreas: No pancreatic ductal dilation or evidence of acute inflammation.   Spleen: No splenomegaly.   Adrenals/Urinary Tract: Bilateral adrenal glands appear normal. Bilateral percutaneous nephroureteral stents dense in place with mild prominence of the bilateral collecting systems and ureters and some urothelial hyperenhancement for instance in the bilateral proximal ureters on image 54/7.   Urinary bladder is nondistended limiting evaluation.   Stomach/Bowel: Stomach is unremarkable for degree of distension. No pathologic dilation of small or large bowel. No evidence of acute bowel inflammation.   Vascular/Lymphatic: Aortic atherosclerosis. IVC filter in place. No pathologically enlarged abdominal or pelvic lymph nodes.   Reproductive: Fiducial markers in the prostate gland.   Other: No significant  abdominopelvic free fluid.   Musculoskeletal: Multifocal sclerotic osseous lesions involving the axial and proximal appendicular skeleton are new from prior examination for instance in the left pubic symphysis on image 122/6, in the right proximal femur on image 129/6 and in multiple vertebral bodies on image 65/4.   IMPRESSION: 1. Multifocal new tiny sclerotic osseous lesions involving the axial and proximal appendicular skeleton, compatible with osseous metastatic disease. 2. Bilateral percutaneous nephroureteral stents in place with mild prominence of the bilateral collecting systems and ureters and some urothelial hyperenhancement for instance in the bilateral proximal ureters, which may reflect infection or inflammation. 3. Previously seen right upper lobe ground-glass pulmonary nodule has resolved. New 10 mm right lower lobe ground-glass pulmonary nodule with adjacent tiny satellite nodules, nonspecific but favored to reflect an infectious or inflammatory process. Suggest continued attention on follow-up imaging. 4. Nondistended urinary bladder limits evaluation. 5. Cholelithiasis without findings of acute cholecystitis. 6.  Aortic Atherosclerosis (ICD10-I70.0).     Electronically Signed   By: Allen Macdonald M.D.   On: 10/28/2023 15:48    MRI SPINE THORACIC AND LUMBAR WWO CONTRAST: 11/08/2023  Narrative & Impression  CLINICAL DATA:  Provided history: Assessment of cancer to bone suggested by CT scan. Malignant neoplasm of bladder, unspecified.   EXAM: MRI THORACIC AND LUMBAR SPINE WITHOUT AND WITH CONTRAST   TECHNIQUE: Multiplanar and multiecho pulse sequences of the thoracic and lumbar spine were obtained without and with intravenous contrast.   CONTRAST:  10 mL gadobutroL (GADAVIST) INTRAVENOUS SOLUTION (SDV) soln 10 mL   COMPARISON:  CT chest/abdomen/pelvis 10/28/2023. CT abdomen/pelvis 07/20/2023. Chest CT 07/01/2023. PET CT 02/20/2020.   FINDINGS: MRI  THORACIC SPINE FINDINGS   Alignment:  No significant spondylolisthesis.   Vertebrae: Numerous small osseous lesions within the thoracic spine, the majority of which are sclerotic and were better appreciated on the recent prior CT of 10/28/2023. Findings  are consistent with osseous metastatic disease. The largest is a 16 mm lesion within the T6 vertebral body (for instance as seen on series 11, image 10) (series 17, image 10). No thoracic vertebral compression fracture. No epidural extension of disease is identified. Multilevel bridging ventrolateral osteophytes (with relative preservation of the intervertebral disc spaces). These findings are compatible with diffuse idiopathic skeletal hyperostosis (DISH). Multilevel degenerative endplate irregularity.   Cord: No signal abnormality identified within the thoracic spinal cord. No pathologic spinal cord enhancement.   Paraspinal and other soft tissues: No paraspinal mass or collection.   Disc levels:   Multilevel disc degeneration, greatest at T8-T9 (moderate-to-advanced at this level).   At T1-T2, there is a shallow broad-based central disc protrusion (without significant spinal canal stenosis).   At T5-T6, there is a small central disc protrusion (without significant spinal canal stenosis).   At T6-T7, there is a small central disc protrusion (without significant spinal canal stenosis).   At T7-T8, there is a central disc protrusion eccentric to the left which efface the ventral thecal sac, resulting in mild spinal canal stenosis and mildly flattening the ventral aspect of the spinal cord.   No significant foraminal stenosis within the thoracic spine.   MRI LUMBAR SPINE FINDINGS   Segmentation: 5 lumbar vertebrae. The caudal most well-formed intervertebral disc space is designated L5-S1.   Alignment: Mild grade 1 retrolisthesis at T12-L1, L1-L2, L2-L3, L3-L4 L4-L5 and L5-S1.   Vertebrae: Numerous small osseous lesions  within the lumbar spine, sacral spine and iliac bones, the majority of which are sclerotic and were better appreciated on the recent prior CT of 10/28/2023. Findings are consistent with osseous metastatic disease. The largest is a 12 mm lesion within the left aspect of the L2 vertebral body (for instance as seen on series 9, image 12) (series 11, image 12). No lumbar vertebral compression fracture. No evidence of epidural extension of disease. Multilevel degenerative endplate irregularity. Multilevel ventrolateral osteophytes.   Conus medullaris: Extends to the L1-L2 level and appears normal. No pathologic enhancement of the cauda equina nerve roots.   Paraspinal and other soft tissues: No paraspinal mass or collection.   Disc levels:   Multilevel disc degeneration, greatest at L2-L3 and L5-S1 (moderate-to-advanced at these levels).   T12-L1: Mild grade 1 retrolisthesis. Disc bulge. No significant spinal canal or foraminal stenosis.   L1-L2: Mild grade 1 retrolisthesis. Facet hypertrophy. No significant spinal canal or foraminal stenosis.   L2-L3: Mild grade 1 retrolisthesis. Disc bulge asymmetric to the right. Endplate osteophytes along the right lateral aspect of the disc space. Facet and ligamentum flavum hypertrophy. No significant spinal canal stenosis. Mild relative right neural foraminal narrowing.   L3-L4: Mild grade 1 retrolisthesis. Disc bulge. Facet and ligamentum flavum hypertrophy. Mild relative spinal canal narrowing. Mild relative neural foraminal narrowing on the right.   L4-L5: Mild grade 1 retrolisthesis. Disc bulge. Facet and ligamentum flavum hypertrophy. Mild relative bilateral subarticular narrowing. No significant central canal stenosis. Mild-to-moderate bilateral neural foraminal narrowing.   L5-S1: Grade 1 retrolisthesis. Disc bulge asymmetric to the left. Endplate osteophytes along the left lateral aspect of the disc space. Mild facet hypertrophy.  No significant spinal canal stenosis. Bilateral neural foraminal narrowing (mild right, moderate left).   IMPRESSION: MRI thoracic spine:   1. Numerous small osseous lesions within the thoracic spine, the majority of which are sclerotic and were better appreciated on the recent prior CT chest/abdomen/pelvis of 10/28/2023. Findings are consistent with osseous metastatic disease. No pathologic compression fracture.  No epidural extension of disease. 2. Thoracic spondylosis as described. At T7-T8, a central disc protrusion results in mild spinal canal stenosis (and mildly flattens the ventral aspect of the spinal cord). No significant spinal canal stenosis at the remaining levels. No significant foraminal stenosis. 3. Findings compatible with diffuse idiopathic skeletal hyperostosis (DISH).   MRI lumbar spine:   1. Numerous small osseous lesions within the lumbar spine, sacral spine and iliac bones, the majority of which are sclerotic and were better appreciated on the recent prior CT of 10/28/2023. Findings are consistent with osseous metastatic disease. No pathologic compression fracture. No epidural extension of disease. 2. Lumbar spondylosis as outlined within the body of the report. No more than mild subarticular or central canal stenosis. Multilevel foraminal stenosis, greatest on the left at L5-S1 (moderate at this site).     Electronically Signed   By: Rockey Childs D.O.   On: 11/08/2023 10:17    Assessment and Plan: 11/11/2023; Last seen 11/02/2023    (Initial consult on 07/26/2023)   1. Cancer of the bladder - poorly differentiated with muscle invasion. Confined to bladder by CT. Initially cT3N0M0. Now Stage IV, metastatic to the bone by MIR of the spine on 11/08/2023.   S/p cystoscopy with clot evacuation and TURBT on 06/27/2023. Tissue exam revealed poorly-differentiated carcinoma, invading smooth muscle. The pattern of staining was nonspecific and could not differentiate  between urothelial carcinoma versus prostatic adenocarcinoma since tumor cells are nonreactive for all the above antibodies. But PSA was not measurable and so likely poorly differentiated bladder cancer.   Explained that people with bladder cancer who can tolerate chemotherapy should receive neoadjuvant chemotherapy. Discussed Cisplatin and Carboplatin with the patient. I told the patient that Cisplatin is difficult to give to people with kidney issues. Discussed Durvalumab/Cisplatin/Gemcitabine with the patient. I explained that we could split dose the Cisplatin to prevent damage to the kidney. Reviewed side effects and toxicities of treatment. I explained that we would continue with immunotherapy for 8 cycles after surgery.    On 07/26/2023, I prescribed Compazine , Zofran , and Zyprexa for nausea.  His creatinine was >8.0 but is improving. His creatinine was 1.33 on 07/26/2023. We gave kidneys some time to improve before starting treatment.  Started Imfinzi/Cisplatin/Gemcitabine on 08/08/2023. We are using split dose Cisplatin d1 and d8 at 35 mg/m2. After Day 1, Cycle 1, he developed urinary incontinence. Prior to starting chemotherapy, he had not been able to urinate independently.   He started experiencing hematuria on 08/20/2023. His platelet count was 21,000 and we transfused 2 units of platelets. His counts were low on 08/24/2023; his WBC was 2,440, hemoglobin was 8.1 g/dL and his platelet count was 72,000. Counts would not be ready for re-treatment on 08/30/2023. We held chemotherapy on 08/30/2023 to allow full recovery of his counts.    I reduced dose of Gemcitabine by 50% on 08/24/2023 due to severe thrombocytopenia and bleeding, which led to drop in hemoglobin. Held Cisplatin on Day 8, Cycle 2 and gave gemcitabine alone due to worsening kidney function and developing hearing loss. Switched from Cisplatin to Carboplatin with Day 1, Cycle 3 on 09/27/2023. Held treatment on 09/27/2023 due to a neutrophil count  of 1,400, dehydration and falling hemoglobin. He will receive treatment next week.   Patient met with Dr. Evern on 09/08/2023 to discuss cystoprostatectomy. We aimed to get 3 cycles of neoadjuvant treatment first.    On 10/04/2023, he reported transient hematuria and temporarily stopped taking his Eliquis  due to the  new onset bleeding. He was advised to immediately restart given his adequate counts and his high probability for a recurrent thromboembolism with discontinuation of anticoagulation.   S/p Day 8, Cycle 3 of Gemcitabine on 10/11/2023. Completed 3 cycles of neoadjuvant platinum therapy and immunotherapy.   CT of the chest, abdomen, pelvis on 10/28/2023 showed multifocal new tiny sclerotic osseous lesions involving the axial and proximal appendicular skeleton, compatible with osseous metastatic disease. Previously seen right upper lobe ground-glass pulmonary nodule has resolved. New 10 mm right lower lobe ground-glass pulmonary nodule with adjacent tiny satellite nodules, nonspecific but favored to reflect an infectious or inflammatory process.   Given that he is now metastatic to the bone, he is no longer a candidate for surgical resection. We are ordering a PET scan to determine how active the cancer is in the bones. In retrospect, there were lesions in the bones prior, but it could not definitively be called metastatic disease to the bone. Based on the PET scan, we will determine if these are treated vs progressive lesions; if there is low activity in the osseous lesions and he is in a partial remission, we will continue immunotherapy with Imfinzi to hopefully prevent side effects from treatment. If the osseous lesions are very metabolically active and he is progressing now, we will switch to enfortumab and Keytruda.   We are sending a NGS study today, 11/11/2023.   Patient is agreeable to this approach.   Addendum: see PET/Bone mets 8/4/20251. Hypermetabolism at the base of the bladder and  in the region of prior prostate surgery with brachytherapy seeds. This is likely urine activity. No obvious bladder lesion. 2. No findings for metastatic disease involving the soft tissues of the chest, abdomen or pelvis. 3. Scattered sclerotic bone lesions most of which are not hypermetabolic. This is likely treated prostate cancer. (Written by radiologist) 4. A few scattered bone lesions demonstrate mild hypermetabolism most notably the right-side of the manubrium of the sternum which is suspicious for active metastatic disease.  Most of mets are inactive. But  one is definitely active in his sternum. Tried to get him in to do PSA (history stage 4 prostate in remission 9 years. Last PSA in Feb was 0.00.)But patient had stroke yesterday and is at [facility] after  thrombectomy and cannot come in. Will see one of my partners next week.Pld prostaae cancer just starting to grow or new bladder cancer mostly well traeted and out in remission except for 1-2 lesions.   2. Bone mets - CT of the chest, abdomen, pelvis on 10/28/2023 showed multifocal new tiny sclerotic osseous lesions involving the axial and proximal appendicular skeleton, compatible with osseous metastatic disease.  CT scan on 10/28/2023 was suggestive of new osseous metastatic disease. It was confirmed by MRI of the lumbar and thoracic spine on 11/08/2023; MRI of the spine showed numerous small osseous lesions within the thoracic and lumbar spine, consistent with osseous metastatic disease. No pathologic compression fracture or epidural extension of disease.   We are ordering a PET scan to determine how active the cancer is in the bones. In retrospect, there were lesions in the bones prior, but it could not definitively be called metastatic disease to the bone. Based on the PET scan, we will determine if these are treated vs progressive lesions; if there is low activity in the osseous lesions and he is in a partial remission, we will continue  immunotherapy with Imfinzi to hopefully prevent side effects from treatment. If the osseous lesions  are very metabolically active and he is progressing now, we will switch to enfortumab and Keytruda.   Addendum : 11/22/2023 PET scan revealed:  3. Prostate cancer advanced to lymph nodes (metastatic) 2016- Gleason score (3+3=6). S/p brachytherapy in 2003. He had rising PSA in 2014 and 2016, but had negative prostate biopsies. He was found to have recurrence in a left intrapelvic lymph node in 2016 and was started on ADT. PSA was 28 prior to ADT.   Last PSA was <0.01 on 06/01/2023. He is currently still receiving Lupron  through urology. Last Lupron  was received on 07/21/2023.   4. H/o laryngeal cancer - in 2021. Stage I (T1 N0 M0) laryngeal cancer. S/p radiation therapy.   PET scan on 02/20/2020 showed intense metabolic activity in the anterior commissure of the larynx consistent with head and neck cancer. No evidence of metastatic adenopathy in the neck or distant metastatic disease. As there was no evidence of metastatic disease, he did not warrant concurrent chemotherapy. He underwent radiation therapy.   5. H/o DVT/PE - DVT and PE in 2014, which was caused by travels and sedentary life style. He apparently had some hematologic work-up at the time which was negative. He was on anticoagulants for 6 months then discontinued.   Doppler on 04/22/2017 found an acute DVT involving the left femoral vein, left popliteal vein, and left peroneal veins. He was started on Xaretlo by Dr. Bernie.   Xarelto  has been held intermittently in early 2025 for hematuria. He reported still being off Xarelto  on 08/22/2023, due to the history of hematuria.    Patient had increased swelling in the right ankle on 09/06/2023. He was seen in the ED on 09/07/2023. Doppler on 09/07/2023 found an acute DVT in the right popliteal vein, posterior tibial veins, and left superficial femoral vein. He has an IVC filter, so the recommendation was  to start low dose anticoagulants. He was started on Eliquis .   On 10/04/2023, he reported transient hematuria and temporarily stopped taking his Eliquis  due to the new onset bleeding. He was advised to immediately restart given his adequate counts and his high probability for a recurrent thromboembolism with discontinuation of anticoagulation.   He is on Eliquis  2.5 mg BID.   Had one day of mild bleeding. Held Eliquis  for 2 days then restarted.   Patient presented to our urgent care on 10/17/2023 with complaint of blood in his nephrostomy bag, starting 2 days prior. He was on Eliquis  for history of DVT/PE at the time. His platelet count had fallen to 26,000 on 10/17/2023. We gave one unit of platelets and advised him to hold Eliquis  until his platelet count was above 50,000. Restarted Eliquis  on 10/22/2023.   6. Bilateral hydronephrosis. Patient has nephrostomy tubes - Creatinine initially >8.0 and on 07/26/2023, creatinine was down to 1.33 and GFR 57.   He had his nephrostomy tubes changed on 08/29/2023. He has an appointment to have his nephrostomy tubes exchanged on 10/25/2023.   7. Anemia, B12 deficiency - he received a blood transfusion while in the hospital in early 2025. He is asymptomatic.   Work-up on 08/08/2023: his ferritin was 119 with a percent saturation of 17%, B12 was low at 155, and folate was 8.8. Free kappa was mildly elevated at 23.84, free lambda was 25.47, and kappa/lambda ratio was 0.94.   Recommended IM B12 injections daily x 4, then taking oral B12 daily. Received B12 injections from 08/10/2023 - 08/15/2023. He is taking oral B12 daily.   He has  been experiencing hematuria.   Received 2 units of PRBC on 09/27/2023 for a hemoglobin of 7.7 g/dL.    Received 2 units of PRBC given hemoglobin of 7.3 g/dL on 2/82/7974.    On 11/02/2023, his ferritin was 714 with a percent saturation of 12%, and B12 was 634. Three stool guaiacs on 11/08/2023 were negative for blood.   Today, his Hgb  10.1 g/dL. He is asymptomatic.   8. Thrombocytopenia - his platelet count was 21,000 on 08/22/2023. He was experiencing hematuria. Patient was off Xarelto  at the time, due to history of hematuria. We are setting him up for platelet transfusions, to bring his platelet count up to 50,000. We transfused 2 units of platelets on 08/22/2023. Platelet count improved to 72,000 on 08/24/2023.   Patient presented to our urgent care on 10/17/2023 with complaint of blood in his nephrostomy bag, starting 2 days prior. He was on Eliquis  for history of DVT/PE at the time. His platelet count had fallen to 26,000 on 10/17/2023. We gave 1 unit of platelets on 10/17/2023 and 1 unit on 10/18/2023 and advised him to hold Eliquis  until his platelet count was above 50,000. Restarted Eliquis  on 10/22/2023.   Platelet count is 181,000 today, 11/11/2023.     Zachary Slocumb, MD   In total, 45 minutes of time was spent on this encounter, greater than 50% of which was spent face-to-face with Kaysan James Macnair for the history, physical exam, assessment and formulation of treatment plan.  This document serves as a record of services personally performed by Zachary Slocumb, MD. It was created on their behalf by Marlis Media, Scribe, a trained medical scribe. The creation of this record is the provider's dictation and/or activities during the visit.   Electronically signed by: Marlis Media, Scribe 11/11/2023 8:55 AM

## 2023-11-10 DIAGNOSIS — C679 Malignant neoplasm of bladder, unspecified: Secondary | ICD-10-CM | POA: Diagnosis not present

## 2023-11-11 DIAGNOSIS — C679 Malignant neoplasm of bladder, unspecified: Secondary | ICD-10-CM | POA: Diagnosis not present

## 2023-11-11 DIAGNOSIS — C61 Malignant neoplasm of prostate: Secondary | ICD-10-CM | POA: Diagnosis not present

## 2023-11-11 DIAGNOSIS — Z5111 Encounter for antineoplastic chemotherapy: Secondary | ICD-10-CM | POA: Diagnosis not present

## 2023-11-18 DIAGNOSIS — C61 Malignant neoplasm of prostate: Secondary | ICD-10-CM | POA: Diagnosis not present

## 2023-11-18 DIAGNOSIS — I700 Atherosclerosis of aorta: Secondary | ICD-10-CM | POA: Diagnosis not present

## 2023-11-18 DIAGNOSIS — J90 Pleural effusion, not elsewhere classified: Secondary | ICD-10-CM | POA: Diagnosis not present

## 2023-11-18 DIAGNOSIS — C679 Malignant neoplasm of bladder, unspecified: Secondary | ICD-10-CM | POA: Diagnosis not present

## 2023-11-18 DIAGNOSIS — M899 Disorder of bone, unspecified: Secondary | ICD-10-CM | POA: Diagnosis not present

## 2023-11-18 DIAGNOSIS — Z79899 Other long term (current) drug therapy: Secondary | ICD-10-CM | POA: Diagnosis not present

## 2023-11-18 DIAGNOSIS — K8020 Calculus of gallbladder without cholecystitis without obstruction: Secondary | ICD-10-CM | POA: Diagnosis not present

## 2023-11-18 DIAGNOSIS — N3289 Other specified disorders of bladder: Secondary | ICD-10-CM | POA: Diagnosis not present

## 2023-11-20 ENCOUNTER — Emergency Department (HOSPITAL_COMMUNITY)

## 2023-11-20 ENCOUNTER — Encounter (HOSPITAL_COMMUNITY)
Admission: EM | Disposition: A | Discharge status: Rehabilitation Facility | Payer: Self-pay | Source: Home / Self Care | Attending: Neurology

## 2023-11-20 ENCOUNTER — Other Ambulatory Visit: Payer: Self-pay

## 2023-11-20 ENCOUNTER — Inpatient Hospital Stay (HOSPITAL_COMMUNITY)
Admission: EM | Admit: 2023-11-20 | Discharge: 2023-11-25 | DRG: 023 | Disposition: A | Discharge status: Rehabilitation Facility | Attending: Neurology | Admitting: Neurology

## 2023-11-20 ENCOUNTER — Emergency Department (HOSPITAL_COMMUNITY): Admitting: Certified Registered Nurse Anesthetist

## 2023-11-20 ENCOUNTER — Encounter (HOSPITAL_COMMUNITY): Payer: Self-pay | Admitting: Neurology

## 2023-11-20 DIAGNOSIS — D6869 Other thrombophilia: Secondary | ICD-10-CM | POA: Diagnosis present

## 2023-11-20 DIAGNOSIS — E860 Dehydration: Secondary | ICD-10-CM | POA: Diagnosis not present

## 2023-11-20 DIAGNOSIS — C7951 Secondary malignant neoplasm of bone: Secondary | ICD-10-CM | POA: Diagnosis not present

## 2023-11-20 DIAGNOSIS — Z86711 Personal history of pulmonary embolism: Secondary | ICD-10-CM

## 2023-11-20 DIAGNOSIS — D62 Acute posthemorrhagic anemia: Secondary | ICD-10-CM | POA: Diagnosis not present

## 2023-11-20 DIAGNOSIS — I6502 Occlusion and stenosis of left vertebral artery: Secondary | ICD-10-CM | POA: Diagnosis not present

## 2023-11-20 DIAGNOSIS — Z7901 Long term (current) use of anticoagulants: Secondary | ICD-10-CM | POA: Diagnosis not present

## 2023-11-20 DIAGNOSIS — I63512 Cerebral infarction due to unspecified occlusion or stenosis of left middle cerebral artery: Secondary | ICD-10-CM | POA: Diagnosis present

## 2023-11-20 DIAGNOSIS — K219 Gastro-esophageal reflux disease without esophagitis: Secondary | ICD-10-CM | POA: Diagnosis present

## 2023-11-20 DIAGNOSIS — R29705 NIHSS score 5: Secondary | ICD-10-CM | POA: Diagnosis not present

## 2023-11-20 DIAGNOSIS — R29724 NIHSS score 24: Secondary | ICD-10-CM | POA: Diagnosis present

## 2023-11-20 DIAGNOSIS — E66811 Obesity, class 1: Secondary | ICD-10-CM | POA: Diagnosis not present

## 2023-11-20 DIAGNOSIS — Z8249 Family history of ischemic heart disease and other diseases of the circulatory system: Secondary | ICD-10-CM | POA: Diagnosis not present

## 2023-11-20 DIAGNOSIS — R9082 White matter disease, unspecified: Secondary | ICD-10-CM | POA: Diagnosis not present

## 2023-11-20 DIAGNOSIS — I651 Occlusion and stenosis of basilar artery: Secondary | ICD-10-CM | POA: Diagnosis not present

## 2023-11-20 DIAGNOSIS — I82441 Acute embolism and thrombosis of right tibial vein: Secondary | ICD-10-CM | POA: Diagnosis not present

## 2023-11-20 DIAGNOSIS — I679 Cerebrovascular disease, unspecified: Secondary | ICD-10-CM | POA: Diagnosis not present

## 2023-11-20 DIAGNOSIS — I6782 Cerebral ischemia: Secondary | ICD-10-CM | POA: Diagnosis not present

## 2023-11-20 DIAGNOSIS — I420 Dilated cardiomyopathy: Secondary | ICD-10-CM

## 2023-11-20 DIAGNOSIS — I63442 Cerebral infarction due to embolism of left cerebellar artery: Secondary | ICD-10-CM | POA: Diagnosis not present

## 2023-11-20 DIAGNOSIS — R29719 NIHSS score 19: Secondary | ICD-10-CM | POA: Diagnosis not present

## 2023-11-20 DIAGNOSIS — D6859 Other primary thrombophilia: Secondary | ICD-10-CM | POA: Diagnosis not present

## 2023-11-20 DIAGNOSIS — N179 Acute kidney failure, unspecified: Secondary | ICD-10-CM | POA: Diagnosis not present

## 2023-11-20 DIAGNOSIS — N390 Urinary tract infection, site not specified: Secondary | ICD-10-CM | POA: Diagnosis present

## 2023-11-20 DIAGNOSIS — E669 Obesity, unspecified: Secondary | ICD-10-CM | POA: Diagnosis present

## 2023-11-20 DIAGNOSIS — D649 Anemia, unspecified: Secondary | ICD-10-CM | POA: Diagnosis not present

## 2023-11-20 DIAGNOSIS — I82433 Acute embolism and thrombosis of popliteal vein, bilateral: Secondary | ICD-10-CM | POA: Diagnosis not present

## 2023-11-20 DIAGNOSIS — I110 Hypertensive heart disease with heart failure: Secondary | ICD-10-CM | POA: Diagnosis not present

## 2023-11-20 DIAGNOSIS — I63032 Cerebral infarction due to thrombosis of left carotid artery: Secondary | ICD-10-CM | POA: Diagnosis not present

## 2023-11-20 DIAGNOSIS — K31819 Angiodysplasia of stomach and duodenum without bleeding: Secondary | ICD-10-CM | POA: Diagnosis not present

## 2023-11-20 DIAGNOSIS — I10 Essential (primary) hypertension: Secondary | ICD-10-CM | POA: Diagnosis not present

## 2023-11-20 DIAGNOSIS — Z86718 Personal history of other venous thrombosis and embolism: Secondary | ICD-10-CM

## 2023-11-20 DIAGNOSIS — Z6833 Body mass index (BMI) 33.0-33.9, adult: Secondary | ICD-10-CM | POA: Diagnosis not present

## 2023-11-20 DIAGNOSIS — R195 Other fecal abnormalities: Secondary | ICD-10-CM | POA: Diagnosis not present

## 2023-11-20 DIAGNOSIS — I69398 Other sequelae of cerebral infarction: Secondary | ICD-10-CM | POA: Diagnosis not present

## 2023-11-20 DIAGNOSIS — R29710 NIHSS score 10: Secondary | ICD-10-CM | POA: Diagnosis not present

## 2023-11-20 DIAGNOSIS — I6522 Occlusion and stenosis of left carotid artery: Secondary | ICD-10-CM | POA: Diagnosis not present

## 2023-11-20 DIAGNOSIS — E785 Hyperlipidemia, unspecified: Secondary | ICD-10-CM | POA: Diagnosis present

## 2023-11-20 DIAGNOSIS — G8191 Hemiplegia, unspecified affecting right dominant side: Secondary | ICD-10-CM | POA: Diagnosis not present

## 2023-11-20 DIAGNOSIS — I82412 Acute embolism and thrombosis of left femoral vein: Secondary | ICD-10-CM | POA: Diagnosis not present

## 2023-11-20 DIAGNOSIS — I670 Dissection of cerebral arteries, nonruptured: Secondary | ICD-10-CM | POA: Diagnosis not present

## 2023-11-20 DIAGNOSIS — Z79899 Other long term (current) drug therapy: Secondary | ICD-10-CM | POA: Diagnosis not present

## 2023-11-20 DIAGNOSIS — Z91030 Bee allergy status: Secondary | ICD-10-CM

## 2023-11-20 DIAGNOSIS — I639 Cerebral infarction, unspecified: Principal | ICD-10-CM

## 2023-11-20 DIAGNOSIS — C679 Malignant neoplasm of bladder, unspecified: Secondary | ICD-10-CM | POA: Diagnosis not present

## 2023-11-20 DIAGNOSIS — I509 Heart failure, unspecified: Secondary | ICD-10-CM | POA: Diagnosis not present

## 2023-11-20 DIAGNOSIS — D509 Iron deficiency anemia, unspecified: Secondary | ICD-10-CM | POA: Diagnosis not present

## 2023-11-20 DIAGNOSIS — J9811 Atelectasis: Secondary | ICD-10-CM | POA: Diagnosis not present

## 2023-11-20 DIAGNOSIS — Z8551 Personal history of malignant neoplasm of bladder: Secondary | ICD-10-CM | POA: Diagnosis not present

## 2023-11-20 DIAGNOSIS — I69391 Dysphagia following cerebral infarction: Secondary | ICD-10-CM | POA: Diagnosis not present

## 2023-11-20 DIAGNOSIS — Z8521 Personal history of malignant neoplasm of larynx: Secondary | ICD-10-CM

## 2023-11-20 DIAGNOSIS — I6523 Occlusion and stenosis of bilateral carotid arteries: Secondary | ICD-10-CM | POA: Diagnosis not present

## 2023-11-20 DIAGNOSIS — I69320 Aphasia following cerebral infarction: Secondary | ICD-10-CM | POA: Diagnosis not present

## 2023-11-20 DIAGNOSIS — K449 Diaphragmatic hernia without obstruction or gangrene: Secondary | ICD-10-CM | POA: Diagnosis not present

## 2023-11-20 DIAGNOSIS — R4701 Aphasia: Secondary | ICD-10-CM | POA: Diagnosis present

## 2023-11-20 DIAGNOSIS — I63132 Cerebral infarction due to embolism of left carotid artery: Secondary | ICD-10-CM | POA: Diagnosis not present

## 2023-11-20 DIAGNOSIS — K2289 Other specified disease of esophagus: Secondary | ICD-10-CM | POA: Diagnosis not present

## 2023-11-20 DIAGNOSIS — R29818 Other symptoms and signs involving the nervous system: Secondary | ICD-10-CM | POA: Diagnosis not present

## 2023-11-20 DIAGNOSIS — K3189 Other diseases of stomach and duodenum: Secondary | ICD-10-CM | POA: Diagnosis not present

## 2023-11-20 DIAGNOSIS — I672 Cerebral atherosclerosis: Secondary | ICD-10-CM | POA: Diagnosis not present

## 2023-11-20 DIAGNOSIS — I63412 Cerebral infarction due to embolism of left middle cerebral artery: Principal | ICD-10-CM | POA: Diagnosis present

## 2023-11-20 DIAGNOSIS — E876 Hypokalemia: Secondary | ICD-10-CM | POA: Diagnosis not present

## 2023-11-20 DIAGNOSIS — I080 Rheumatic disorders of both mitral and aortic valves: Secondary | ICD-10-CM | POA: Diagnosis not present

## 2023-11-20 DIAGNOSIS — R531 Weakness: Secondary | ICD-10-CM | POA: Diagnosis not present

## 2023-11-20 DIAGNOSIS — I951 Orthostatic hypotension: Secondary | ICD-10-CM | POA: Diagnosis not present

## 2023-11-20 DIAGNOSIS — I82462 Acute embolism and thrombosis of left calf muscular vein: Secondary | ICD-10-CM | POA: Diagnosis not present

## 2023-11-20 DIAGNOSIS — I7770 Dissection of unspecified artery: Secondary | ICD-10-CM | POA: Diagnosis not present

## 2023-11-20 DIAGNOSIS — I69322 Dysarthria following cerebral infarction: Secondary | ICD-10-CM | POA: Diagnosis not present

## 2023-11-20 DIAGNOSIS — Z8546 Personal history of malignant neoplasm of prostate: Secondary | ICD-10-CM

## 2023-11-20 DIAGNOSIS — K317 Polyp of stomach and duodenum: Secondary | ICD-10-CM | POA: Diagnosis not present

## 2023-11-20 DIAGNOSIS — Z923 Personal history of irradiation: Secondary | ICD-10-CM

## 2023-11-20 DIAGNOSIS — I63312 Cerebral infarction due to thrombosis of left middle cerebral artery: Secondary | ICD-10-CM | POA: Diagnosis not present

## 2023-11-20 DIAGNOSIS — I825Y3 Chronic embolism and thrombosis of unspecified deep veins of proximal lower extremity, bilateral: Secondary | ICD-10-CM | POA: Diagnosis not present

## 2023-11-20 DIAGNOSIS — R29810 Facial weakness: Secondary | ICD-10-CM | POA: Diagnosis not present

## 2023-11-20 DIAGNOSIS — R7401 Elevation of levels of liver transaminase levels: Secondary | ICD-10-CM | POA: Diagnosis not present

## 2023-11-20 DIAGNOSIS — I6340 Cerebral infarction due to embolism of unspecified cerebral artery: Secondary | ICD-10-CM | POA: Diagnosis not present

## 2023-11-20 DIAGNOSIS — D638 Anemia in other chronic diseases classified elsewhere: Secondary | ICD-10-CM | POA: Diagnosis present

## 2023-11-20 DIAGNOSIS — I69351 Hemiplegia and hemiparesis following cerebral infarction affecting right dominant side: Secondary | ICD-10-CM | POA: Diagnosis not present

## 2023-11-20 DIAGNOSIS — R29718 NIHSS score 18: Secondary | ICD-10-CM | POA: Diagnosis not present

## 2023-11-20 DIAGNOSIS — R41841 Cognitive communication deficit: Secondary | ICD-10-CM | POA: Diagnosis present

## 2023-11-20 DIAGNOSIS — Z8673 Personal history of transient ischemic attack (TIA), and cerebral infarction without residual deficits: Secondary | ICD-10-CM | POA: Diagnosis not present

## 2023-11-20 DIAGNOSIS — R1310 Dysphagia, unspecified: Secondary | ICD-10-CM | POA: Diagnosis present

## 2023-11-20 DIAGNOSIS — D696 Thrombocytopenia, unspecified: Secondary | ICD-10-CM | POA: Diagnosis not present

## 2023-11-20 DIAGNOSIS — I63543 Cerebral infarction due to unspecified occlusion or stenosis of bilateral cerebellar arteries: Secondary | ICD-10-CM | POA: Diagnosis not present

## 2023-11-20 DIAGNOSIS — R600 Localized edema: Secondary | ICD-10-CM | POA: Diagnosis not present

## 2023-11-20 DIAGNOSIS — I6602 Occlusion and stenosis of left middle cerebral artery: Secondary | ICD-10-CM | POA: Diagnosis not present

## 2023-11-20 DIAGNOSIS — I6350 Cerebral infarction due to unspecified occlusion or stenosis of unspecified cerebral artery: Secondary | ICD-10-CM | POA: Diagnosis not present

## 2023-11-20 DIAGNOSIS — I6389 Other cerebral infarction: Secondary | ICD-10-CM | POA: Diagnosis not present

## 2023-11-20 HISTORY — PX: IR CT HEAD LTD: IMG2386

## 2023-11-20 HISTORY — PX: RADIOLOGY WITH ANESTHESIA: SHX6223

## 2023-11-20 HISTORY — PX: IR US GUIDE VASC ACCESS RIGHT: IMG2390

## 2023-11-20 HISTORY — PX: IR PERCUTANEOUS ART THROMBECTOMY/INFUSION INTRACRANIAL INC DIAG ANGIO: IMG6087

## 2023-11-20 LAB — DIFFERENTIAL
Abs Immature Granulocytes: 0.07 K/uL (ref 0.00–0.07)
Basophils Absolute: 0 K/uL (ref 0.0–0.1)
Basophils Relative: 1 %
Eosinophils Absolute: 0.2 K/uL (ref 0.0–0.5)
Eosinophils Relative: 3 %
Immature Granulocytes: 1 %
Lymphocytes Relative: 13 %
Lymphs Abs: 0.8 K/uL (ref 0.7–4.0)
Monocytes Absolute: 0.5 K/uL (ref 0.1–1.0)
Monocytes Relative: 8 %
Neutro Abs: 4.6 K/uL (ref 1.7–7.7)
Neutrophils Relative %: 74 %

## 2023-11-20 LAB — I-STAT CHEM 8, ED
BUN: 18 mg/dL (ref 8–23)
Calcium, Ion: 1.03 mmol/L — ABNORMAL LOW (ref 1.15–1.40)
Chloride: 108 mmol/L (ref 98–111)
Creatinine, Ser: 1.3 mg/dL — ABNORMAL HIGH (ref 0.61–1.24)
Glucose, Bld: 119 mg/dL — ABNORMAL HIGH (ref 70–99)
HCT: 26 % — ABNORMAL LOW (ref 39.0–52.0)
Hemoglobin: 8.8 g/dL — ABNORMAL LOW (ref 13.0–17.0)
Potassium: 4.4 mmol/L (ref 3.5–5.1)
Sodium: 139 mmol/L (ref 135–145)
TCO2: 21 mmol/L — ABNORMAL LOW (ref 22–32)

## 2023-11-20 LAB — URINALYSIS, COMPLETE (UACMP) WITH MICROSCOPIC
Bilirubin Urine: NEGATIVE
Glucose, UA: NEGATIVE mg/dL
Ketones, ur: NEGATIVE mg/dL
Nitrite: POSITIVE — AB
Protein, ur: 100 mg/dL — AB
Specific Gravity, Urine: 1.026 (ref 1.005–1.030)
WBC, UA: >50 WBC/hpf (ref 0–5)
pH: 5 (ref 5.0–8.0)

## 2023-11-20 LAB — CBC
HCT: 28.6 % — ABNORMAL LOW (ref 39.0–52.0)
Hemoglobin: 9 g/dL — ABNORMAL LOW (ref 13.0–17.0)
MCH: 30.1 pg (ref 26.0–34.0)
MCHC: 31.5 g/dL (ref 30.0–36.0)
MCV: 95.7 fL (ref 80.0–100.0)
Platelets: 183 K/uL (ref 150–400)
RBC: 2.99 MIL/uL — ABNORMAL LOW (ref 4.22–5.81)
RDW: 19.5 % — ABNORMAL HIGH (ref 11.5–15.5)
WBC: 6.1 K/uL (ref 4.0–10.5)
nRBC: 0 % (ref 0.0–0.2)

## 2023-11-20 LAB — COMPREHENSIVE METABOLIC PANEL WITH GFR
ALT: 40 U/L (ref 0–44)
AST: 33 U/L (ref 15–41)
Albumin: 2.7 g/dL — ABNORMAL LOW (ref 3.5–5.0)
Alkaline Phosphatase: 80 U/L (ref 38–126)
Anion gap: 9 (ref 5–15)
BUN: 17 mg/dL (ref 8–23)
CO2: 20 mmol/L — ABNORMAL LOW (ref 22–32)
Calcium: 8.1 mg/dL — ABNORMAL LOW (ref 8.9–10.3)
Chloride: 107 mmol/L (ref 98–111)
Creatinine, Ser: 1.2 mg/dL (ref 0.61–1.24)
GFR, Estimated: >60 mL/min (ref >60)
Glucose, Bld: 115 mg/dL — ABNORMAL HIGH (ref 70–99)
Potassium: 4.4 mmol/L (ref 3.5–5.1)
Sodium: 136 mmol/L (ref 135–145)
Total Bilirubin: 0.4 mg/dL (ref 0.0–1.2)
Total Protein: 6.3 g/dL — ABNORMAL LOW (ref 6.5–8.1)

## 2023-11-20 LAB — GLUCOSE, CAPILLARY: Glucose-Capillary: 113 mg/dL — ABNORMAL HIGH (ref 70–99)

## 2023-11-20 LAB — PROTIME-INR
INR: 1.2 (ref 0.8–1.2)
Prothrombin Time: 16.3 s — ABNORMAL HIGH (ref 11.4–15.2)

## 2023-11-20 LAB — RESP PANEL BY RT-PCR (RSV, FLU A&B, COVID)  RVPGX2
Influenza A by PCR: NEGATIVE
Influenza B by PCR: NEGATIVE
Resp Syncytial Virus by PCR: NEGATIVE
SARS Coronavirus 2 by RT PCR: NEGATIVE

## 2023-11-20 LAB — MRSA NEXT GEN BY PCR, NASAL: MRSA by PCR Next Gen: NOT DETECTED

## 2023-11-20 LAB — APTT: aPTT: 32 s (ref 24–36)

## 2023-11-20 LAB — ETHANOL: Alcohol, Ethyl (B): <15 mg/dL (ref <15)

## 2023-11-20 LAB — CBG MONITORING, ED: Glucose-Capillary: 119 mg/dL — ABNORMAL HIGH (ref 70–99)

## 2023-11-20 SURGERY — RADIOLOGY WITH ANESTHESIA
Anesthesia: General

## 2023-11-20 MED ORDER — SODIUM CHLORIDE 0.9 % IV BOLUS: 250.0000 mL | INTRAVENOUS | Status: DC | PRN

## 2023-11-20 MED ORDER — PHENYLEPHRINE 80 MCG/ML (10ML) SYRINGE FOR IV PUSH (FOR BLOOD PRESSURE SUPPORT)
PREFILLED_SYRINGE | INTRAVENOUS | Status: DC | PRN
  Administered 2023-11-20 (×3): 80 ug via INTRAVENOUS

## 2023-11-20 MED ORDER — ALBUMIN HUMAN 5 % IV SOLN: INTRAVENOUS | Status: DC | PRN

## 2023-11-20 MED ORDER — ONDANSETRON HCL 4 MG/2ML IJ SOLN
INTRAMUSCULAR | Status: DC | PRN
  Administered 2023-11-20: 4 mg via INTRAVENOUS

## 2023-11-20 MED ORDER — LIDOCAINE HCL (CARDIAC) PF 100 MG/5ML IV SOSY
PREFILLED_SYRINGE | INTRAVENOUS | Status: DC | PRN
  Administered 2023-11-20: 40 mg via INTRAVENOUS

## 2023-11-20 MED ORDER — HEPARIN SODIUM (PORCINE) 1000 UNIT/ML IJ SOLN
INTRAMUSCULAR | Status: DC | PRN
  Administered 2023-11-20: 5000 [IU] via INTRAVENOUS

## 2023-11-20 MED ORDER — ALBUMIN HUMAN 5 % IV SOLN
INTRAVENOUS | Status: AC
  Filled 2023-11-20: qty 250

## 2023-11-20 MED ORDER — SENNOSIDES-DOCUSATE SODIUM 8.6-50 MG PO TABS: 1.0000 | ORAL_TABLET | Freq: Every evening | ORAL | Status: DC | PRN

## 2023-11-20 MED ORDER — SODIUM CHLORIDE 0.9% FLUSH
3.0000 mL | Freq: Once | INTRAVENOUS | Status: AC
  Administered 2023-11-20: 3 mL via INTRAVENOUS

## 2023-11-20 MED ORDER — STROKE: EARLY STAGES OF RECOVERY BOOK: Freq: Once | Status: AC

## 2023-11-20 MED ORDER — LABETALOL HCL 5 MG/ML IV SOLN: 10.0000 mg | INTRAVENOUS | Status: DC | PRN

## 2023-11-20 MED ORDER — PHENYLEPHRINE HCL-NACL 20-0.9 MG/250ML-% IV SOLN
INTRAVENOUS | Status: DC | PRN
  Administered 2023-11-20: 20 ug/min via INTRAVENOUS

## 2023-11-20 MED ORDER — IOHEXOL 300 MG/ML  SOLN
150.0000 mL | Freq: Once | INTRAMUSCULAR | Status: AC | PRN
  Administered 2023-11-20: 50 mL via INTRA_ARTERIAL

## 2023-11-20 MED ORDER — SUGAMMADEX SODIUM 200 MG/2ML IV SOLN
INTRAVENOUS | Status: DC | PRN
  Administered 2023-11-20: 200 mg via INTRAVENOUS

## 2023-11-20 MED ORDER — SODIUM CHLORIDE 0.9 % IV SOLN: INTRAVENOUS | Status: DC | PRN

## 2023-11-20 MED ORDER — ACETAMINOPHEN 160 MG/5ML PO SOLN: 650.0000 mg | ORAL | Status: DC | PRN

## 2023-11-20 MED ORDER — PHENYLEPHRINE HCL-NACL 20-0.9 MG/250ML-% IV SOLN
25.0000 ug/min | INTRAVENOUS | Status: DC
  Administered 2023-11-20: 15 ug/min via INTRAVENOUS
  Administered 2023-11-21: 25 ug/min via INTRAVENOUS
  Filled 2023-11-20: qty 250

## 2023-11-20 MED ORDER — CLEVIDIPINE BUTYRATE 0.5 MG/ML IV EMUL: 0.0000 mg/h | INTRAVENOUS | Status: DC

## 2023-11-20 MED ORDER — ACETAMINOPHEN 325 MG PO TABS
650.0000 mg | ORAL_TABLET | ORAL | Status: DC | PRN
  Filled 2023-11-20: qty 2

## 2023-11-20 MED ORDER — PHENYLEPHRINE HCL-NACL 20-0.9 MG/250ML-% IV SOLN: 0.0000 ug/min | INTRAVENOUS | Status: DC

## 2023-11-20 MED ORDER — IOHEXOL 350 MG/ML SOLN
75.0000 mL | Freq: Once | INTRAVENOUS | Status: AC | PRN
  Administered 2023-11-20: 75 mL via INTRAVENOUS

## 2023-11-20 MED ORDER — SODIUM CHLORIDE 0.9 % IV SOLN: INTRAVENOUS | Status: DC

## 2023-11-20 MED ORDER — ACETAMINOPHEN 650 MG RE SUPP
650.0000 mg | RECTAL | Status: DC | PRN
  Administered 2023-11-21 – 2023-11-22 (×3): 650 mg via RECTAL
  Filled 2023-11-20 (×3): qty 1

## 2023-11-20 MED ORDER — SUCCINYLCHOLINE CHLORIDE 200 MG/10ML IV SOSY
PREFILLED_SYRINGE | INTRAVENOUS | Status: DC | PRN
Start: 2023-11-20 — End: 2023-11-20
  Administered 2023-11-20: 100 mg via INTRAVENOUS

## 2023-11-20 MED ORDER — PROPOFOL 10 MG/ML IV BOLUS
INTRAVENOUS | Status: DC | PRN
  Administered 2023-11-20: 100 mg via INTRAVENOUS

## 2023-11-20 MED ORDER — ROCURONIUM BROMIDE 10 MG/ML (PF) SYRINGE
PREFILLED_SYRINGE | INTRAVENOUS | Status: DC | PRN
  Administered 2023-11-20: 40 mg via INTRAVENOUS
  Administered 2023-11-20 (×2): 10 mg via INTRAVENOUS

## 2023-11-20 MED ORDER — SODIUM CHLORIDE 0.9 % IV SOLN: 250.0000 mL | INTRAVENOUS | Status: DC

## 2023-11-20 NOTE — Plan of Care (Signed)
  Problem: Education: Goal: Knowledge of disease or condition will improve Outcome: Progressing Goal: Knowledge of patient specific risk factors will improve (DELETE if not current risk factor) Outcome: Progressing

## 2023-11-20 NOTE — Anesthesia Procedure Notes (Signed)
 Procedure Name: Intubation Date/Time: 11/20/2023 10:32 AM  Performed by: Cindie Donald CROME, CRNAPre-anesthesia Checklist: Patient identified, Emergency Drugs available, Suction available and Patient being monitored Patient Re-evaluated:Patient Re-evaluated prior to induction Oxygen Delivery Method: Circle System Utilized Preoxygenation: Pre-oxygenation with 100% oxygen Induction Type: IV induction Ventilation: Mask ventilation without difficulty Laryngoscope Size: Mac and 4 Grade View: Grade I Tube type: Oral Tube size: 7.5 mm Number of attempts: 1 Airway Equipment and Method: Stylet Placement Confirmation: ETT inserted through vocal cords under direct vision, positive ETCO2 and breath sounds checked- equal and bilateral Secured at: 23 cm Tube secured with: Tape Dental Injury: Teeth and Oropharynx as per pre-operative assessment

## 2023-11-20 NOTE — Code Documentation (Addendum)
 Stroke Response Nurse Documentation Code Documentation  Butler Stene is a 73 y.o. male arriving to [facility]  via [facility] on 11/20/2023 with past medical hx of HTN, DVT/PE on eliquis , bladder cancer s/p nephrostomy. On Eliquis  (apixaban ) daily. Code stroke was activated by EMS.   Patient from home where he was LKW at 0815 and now complaining of right sided weakness, right facial droop, left gaze. Patient went to use the bathroom. When his wife checked on him, she found him sitting on the commode, not speaking.   Stroke team at the bedside on patient arrival. Labs drawn and patient cleared for CT by Dr. Cleotis. Patient to CT with team. NIHSS 24, see documentation for details and code stroke times.   The following imaging was completed:  CT Head and CTA.   Patient is not a candidate for IV Thrombolytic due to Eliquis . Patient is a candidate for IR due to imaging reveals LVO.   Care Plan: IR    Process Delays Noted: n/a  Bedside handoff with IR RN Ashura.    Darion Milewski L Earle Troiano  Rapid Response RN

## 2023-11-20 NOTE — H&P (Signed)
 NEUROLOGY H&P NOTE   Date of service: November 20, 2023 Patient Name: Allen Macdonald MRN:  [id number] DOB:  Dec 27, 1950 Chief Complaint: R sided weakness  History of Present Illness  Allen Macdonald is a 73 y.o. male with hx of HTN, DVT/PE on eliquis , bladder cancer s/p nephrostomy who took Eliquis  2.5 mg last night and went to bed.  He got up this morning and walked over to the bathroom and talked to his wife.  At 8:15 AM, he was sitting on the toilet and he could not get up.  He called out for his wife and then became mute.  Wife was unable to get up from the toilet by herself and called EMS.  EMS noted right-sided weakness, left gaze and aphasia.  He was activated and brought in as a code stroke.  Blood pressure at the scene was 120 systolic, glucose was normal.  I spoke with patient's wife who tells me that patient was switched over from Xarelto  to Eliquis  as he had clots in his bladder while he was still on Xarelto .  He is on Eliquis  2.5 mg twice daily and took his last dose yesterday night.  CT head without contrast with aspects of 10, no acute intracranial abnormality.  CTA of the head and neck demonstrates a terminal left ICA occlusion with poor opacification of the left MCA branches.  Last known well: 8:15 AM Modified rankin score: 0-Completely asymptomatic and back to baseline post- stroke IV Thrombolysis: Not offered, patient took Eliquis  within the last 48 hours.   Thrombectomy: Yes.  Thrombectomy was discussed with patient's wife over phone and given the disabling nature of his weakness and speech deficits, wife consented to thrombectomy.  Dr. Creston with the neurointerventional radiology also had a detailed discussion with patient's wife over phone.  This conversation was witnessed by me and by Allen Likes NP.  Dr. Ray discussed the details of procedure along with risks and benefits and alternatives.  Wife expressed verbal understanding and consented to thrombectomy.  NIHSS  components Score: Comment  1a Level of Conscious 0[x]  1[]  2[]  3[]      1b LOC Questions 0[]  1[]  2[x]       1c LOC Commands 0[]  1[]  2[x]       2 Best Gaze 0[]  1[]  2[x]       3 Visual 0[]  1[]  2[x]  3[]      4 Facial Palsy 0[]  1[]  2[x]  3[]      5a Motor Arm - left 0[x]  1[]  2[]  3[]  4[]  UN[]    5b Motor Arm - Right 0[]  1[]  2[]  3[]  4[x]  UN[]    6a Motor Leg - Left 0[x]  1[]  2[]  3[]  4[]  UN[]    6b Motor Leg - Right 0[]  1[]  2[]  3[x]  4[]  UN[]    7 Limb Ataxia 0[x]  1[]  2[]  UN[]      8 Sensory 0[]  1[]  2[x]  UN[]      9 Best Language 0[]  1[]  2[]  3[x]      10 Dysarthria 0[]  1[]  2[x]  UN[]      11 Extinct. and Inattention 0[x]  1[]  2[]       TOTAL: 24      ROS  Unable to perform due to aphasia.  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  PC (prostate cancer) (HCC) 08/28/2019   Past Surgical History:  Procedure Laterality Date   CATARACT EXTRACTION Bilateral    INSERTION PROSTATE RADIATION SEED  2003   PROSTATE SURGERY     TONSILLECTOMY     Venacava Filter     WISDOM TOOTH EXTRACTION     Family History  Problem Relation Age of Onset   Heart failure Mother    Pleurisy Mother    Other Father        UNK MEDICAL HX   Social History   Socioeconomic History   Marital status: Married    Spouse name: [name]   Number of children: 0   Years of education: 12 + 2   Highest education level: Not on file  Occupational History   Occupation: RETIRED CONSTRUCTION  Tobacco Use   Smoking status: Never   Smokeless tobacco: Never  Vaping Use   Vaping status: Never Used  Substance and Sexual Activity   Alcohol use: Yes    Alcohol/week: 14.0 standard drinks of alcohol    Types: 10 Cans of beer, 1  Shots of liquor, 3 Glasses of wine per week   Drug use: No   Sexual activity: Not Currently  Other Topics Concern   Not on file  Social History Narrative   Not on file   Social Drivers of Health   Financial Resource Strain: Not on file  Food Insecurity: Low Risk  (06/27/2023)   Received from [facility]   Hunger Vital Sign    Within the past 12 months, you worried that your food would run out before you got money to buy more: Never true    Within the past 12 months, the food you bought just didn't last and you didn't have money to get more. : Never true  Transportation Needs: No Transportation Needs (06/27/2023)   Received from [facility]    In the past 12 months, has lack of reliable transportation kept you from medical appointments, meetings, work or from getting things needed for daily living? : No  Physical Activity: Not on file  Stress: Not on file  Social Connections: Not on file   Allergies  Allergen Reactions   Bee Venom Anaphylaxis    Medications   Current Facility-Administered Medications:    sodium chloride  flush (NS) 0.9 % injection 3 mL, 3 mL, Intravenous, Once, Tegeler, Lonni PARAS, MD  Current Outpatient Medications:    bicalutamide  (CASODEX ) 50 MG tablet, Take 50 mg by mouth daily., Disp: , Rfl:    carvedilol  (COREG ) 12.5 MG tablet, Take 1 tablet (12.5 mg total) by mouth 2 (two) times daily., Disp: 60 tablet, Rfl: 2   DENOSUMAB  [place], Inject 60 mLs into the skin every 6 (six) months., Disp: , Rfl:    EPINEPHRINE IJ, Inject 0.3 mg as directed as needed (Anaphylaxis)., Disp: , Rfl:    famotidine  (PEPCID ) 20 MG tablet, Take 1 tablet (20 mg total) by mouth 2 (two) times daily., Disp: 180 tablet, Rfl: 3   Leuprolide  Acetate (LUPRON  IJ), Inject 1 each as directed every 6 (six) months. Unknown strength, Disp: , Rfl:    loratadine  (CLARITIN ) 10 MG tablet, Take 1 tablet (10 mg total) by mouth 2 (two) times daily., Disp: 180 tablet, Rfl: 3    omeprazole  (PRILOSEC) 40 MG capsule, TAKE 1 CAPSULE BY MOUTH EVERY MORNING (Patient taking differently: Take 40 mg by mouth daily.), Disp: 30 capsule, Rfl: 5   sacubitril-valsartan (ENTRESTO ) 49-51 MG, TAKE 1 TABLET BY MOUTH  TWICE DAILY, Disp: 180 tablet, Rfl: 2   spironolactone  (ALDACTONE ) 25 MG tablet, TAKE 1/2 TABLET BY MOUTH ONCE DAILY., Disp: 45 tablet, Rfl: 1   XARELTO  20 MG TABS tablet, TAKE 1 TABLET BY MOUTH EVERY DAY WITH SUPPER, Disp: 90 tablet, Rfl: 1   Vitals   There were no vitals filed for this visit.   There is no height or weight on file to calculate BMI.  Physical Exam   General: Laying comfortably in bed; in no acute distress.  HENT: Normal oropharynx and mucosa. Normal external appearance of ears and nose.  Neck: Supple, no pain or tenderness  CV: No JVD. No peripheral edema.  Pulmonary: Symmetric Chest rise. Normal respiratory effort.  Abdomen: Soft to touch, non-tender.  Ext: No cyanosis, edema, or deformity  Skin: No rash. Normal palpation of skin.   Musculoskeletal: Normal digits and nails by inspection. No clubbing.   Neurologic Examination  Mental status/Cognition: Alert, does not answer any orientation questions.   Speech/language: Nonsensical speech, hypophonic.  Does not follow commands.  Does not answer any questions. cranial nerves:   CN II Pupils equal and reactive to light, does not blink to threat on the right   CN III,IV,VI Left gaze deviation, does not cross midline   CN V Corneals intact bilaterally   CN VII Right facial droop   CN VIII Makes eye contact to speech on the left.   CN IX & X Protecting his airway.   CN XI Head turned to his left   CN XII Does not protrude tongue on command.   Motor:  Muscle bulk: Normal, tone flaccid in right upper extremity Holds left upper extremity off the bed for more than 10 seconds without any drift. No movement in right upper extremity. Holds left lower extremity off the bed for more than 5 seconds  without any drift. Some movement in left lower extremity.  Sensation:  Light touch    Pin prick No response to pinch in the right arm or right leg   Temperature    Vibration   Proprioception    Coordination/Complex Motor:  Unable to assess secondary to aphasia. Gait deferred given the acuity of the situation and for patient safety.  Labs   CBC:  Recent Labs  Lab 11/20/23 0959 11/20/23 1004  WBC 6.1  --   NEUTROABS 4.6  --   HGB 9.0* 8.8*  HCT 28.6* 26.0*  MCV 95.7  --   PLT 183  --    Basic Metabolic Panel:  Lab Results  Component Value Date   NA 139 11/20/2023   K 4.4 11/20/2023   CO2 21 07/05/2019   GLUCOSE 119 (H) 11/20/2023   BUN 18 11/20/2023   CREATININE 1.30 (H) 11/20/2023   CALCIUM  8.9 07/05/2019   GFRNONAA 79 07/05/2019   GFRAA 91 07/05/2019   Lipid Panel: No results found for: LDLCALC HgbA1c: No results found for: HGBA1C Urine Drug Screen: No results found for: LABOPIA, COCAINSCRNUR, LABBENZ, AMPHETMU, THCU, LABBARB  Alcohol Level No results found for: ETH INR No results found for: INR APTT No results found for: APTT   CT Head without contrast(Personally reviewed): CTH was negative for a large hypodensity concerning for a large territory infarct or hyperdensity concerning for an ICH  CT angio Head and Neck with contrast(Personally reviewed): Left ICA terminal occlusion with poor opacification of left MCA branches.  MRI Brain(Personally reviewed): Pending  Assessment   Joshue Whittley is a 72 y.o. male with hx of HTN,  DVT/PE on eliquis , bladder cancer s/p nephrostomy who took Eliquis  2.5 mg last night.  He developed acute onset weakness on the right with left gaze deviation, right hemianopsia, global aphasia at 8:15 AM.  CT head with aspects of 10.  CTA with terminal left ICA occlusion and poor opacification of left MCA branches.  He is not a candidate for TNKase as he took Eliquis  last night and this was confirmed by his  wife.  Emergent thrombectomy was discussed with his wife and she consented.  Patient being taken to the OR.  Primary Diagnosis:  Cerebral infarction, unspecified.  Recommendations  Acute left MCA stroke due to left ICA terminal thrombus - Frequent Neuro checks per stroke unit protocol - Recommend brain imaging with MRI Brain without contrast - Recommend obtaining TTE  - Recommend obtaining Lipid panel with LDL - Please start statin if LDL > 70 - Recommend HbA1c to evaluate for diabetes and how well it is controlled. - Antithrombotic/anticoagulation -Per neuro IR for the first 24 hours after thrombectomy - SBP goal - per neuro IR for the first 24 hours after thrombectomy - Recommend Telemetry monitoring for arrythmia - Recommend bedside swallow screen prior to PO intake. - Stroke education booklet - Recommend PT/OT/SLP consult  GERD: PPI IV.  Hypertension: Hold home antihypertensives for now.  Will use as needed labetalol /Cleviprex  to keep SBP within goal.   CODE STATUS discussed with the patient's wife and patient is full code.  Allergies verified with patient's wife and patient is allergic to bee venom.  No other allergens identified. ______________________________________________________________________  This patient is critically ill and at significant risk of neurological worsening, death and care requires constant monitoring of vital signs, hemodynamics,respiratory and cardiac monitoring, neurological assessment, discussion with family, other specialists and medical decision making of high complexity. I spent 70 minutes of neurocritical care time  in the care of  this patient. This was time spent independent of any time provided by nurse practitioner or PA.  Dorsey Authement [facility] 11/20/2023  10:44 AM   Signed, Rahmir Beever, MD [facility]

## 2023-11-20 NOTE — Procedures (Signed)
 NIR Procedure Note  Preop Dx: Acute ischemic stroke Post Dx: Left carotid terminus and M1 occlusion  Procedure: Stroke thrombectomy Operator: Maude Naegeli MD  EBL: 200 mL Complications: No immediate  Findings: Stent-retriever assisted mechanical thrombectomy left carotid T and M1 occlusions TICI Score: 2c  Length of Bedrest: 2 hours from NIR standpoint BP goal next 24 hours: SBP 120-160  Sheath Closure: Angioseal Disposition: To PACU in stable condition  Please call with questions, concerns, or change in patient condition  Maude Naegeli MD

## 2023-11-20 NOTE — ED Notes (Signed)
 Pt to IR. Rapid response nurse at bedside with pt.

## 2023-11-20 NOTE — ED Triage Notes (Addendum)
 Pt to ED via EMS as code stroke. Per EMS, pt went to restroom this morning and a short while after was found slumped over. Pt presenting with right-sided weakness, right facial droop and left-sided gaze. LKW 0815. Per EMS, pt baseline A&Ox4 and ambulatory. Pt maintaining own airway and secretions. Pt breathing even and unlabored.

## 2023-11-20 NOTE — ED Provider Notes (Signed)
  EMERGENCY DEPARTMENT AT [facility] Provider Note   CSN: [id number] Arrival date & time: 11/20/23  9041     Patient presents with: No chief complaint on file.   Allen Macdonald is a 73 y.o. male.   The history is provided by the patient and medical records. No language interpreter was used.  Neurologic Problem This is a new problem. The current episode started 1 to 2 hours ago. The problem occurs constantly. The problem has been gradually worsening. Pertinent negatives include no chest pain, no abdominal pain, no headaches and no shortness of breath. Nothing aggravates the symptoms. Nothing relieves the symptoms. He has tried nothing for the symptoms. The treatment provided no relief.       Prior to Admission medications   Medication Sig Start Date End Date Taking? Authorizing Provider  bicalutamide  (CASODEX ) 50 MG tablet Take 50 mg by mouth daily.    [provider]  carvedilol  (COREG ) 12.5 MG tablet Take 1 tablet (12.5 mg total) by mouth 2 (two) times daily. 05/23/19   Krasowski, Robert J, MD  DENOSUMAB  [place] Inject 60 mLs into the skin every 6 (six) months.    [provider]  EPINEPHRINE IJ Inject 0.3 mg as directed as needed (Anaphylaxis).    [provider]  famotidine  (PEPCID ) 20 MG tablet Take 1 tablet (20 mg total) by mouth 2 (two) times daily. 07/27/23   Kozlow, Camellia PARAS, MD  Leuprolide  Acetate (LUPRON  IJ) Inject 1 each as directed every 6 (six) months. Unknown strength    [provider]  loratadine  (CLARITIN ) 10 MG tablet Take 1 tablet (10 mg total) by mouth 2 (two) times daily. 07/27/23   Kozlow, Camellia PARAS, MD  omeprazole  (PRILOSEC) 40 MG capsule TAKE 1 CAPSULE BY MOUTH EVERY MORNING Patient taking differently: Take 40 mg by mouth daily. 11/30/21   Kozlow, Camellia PARAS, MD  sacubitril-valsartan (ENTRESTO ) 49-51 MG TAKE 1 TABLET BY MOUTH TWICE DAILY 04/27/23   Krasowski, Robert J, MD  spironolactone  (ALDACTONE ) 25 MG tablet TAKE 1/2 TABLET  BY MOUTH ONCE DAILY. 06/06/23   Krasowski, Robert J, MD  XARELTO  20 MG TABS tablet TAKE 1 TABLET BY MOUTH EVERY DAY WITH SUPPER 03/28/23   Krasowski, Robert J, MD    Allergies: Bee venom    Review of Systems  Unable to perform ROS: Acuity of condition (Acute stroke with worsening aphasia)  Constitutional:  Negative for fever.  Respiratory:  Negative for cough and shortness of breath.   Cardiovascular:  Negative for chest pain.  Gastrointestinal:  Negative for abdominal pain.  Neurological:  Positive for facial asymmetry, speech difficulty, weakness and numbness. Negative for headaches.  Psychiatric/Behavioral:  Negative for agitation.     Updated Vital Signs There were no vitals taken for this visit.  Physical Exam Vitals and nursing note reviewed.  Constitutional:      Appearance: He is well-developed. He is ill-appearing. He is not toxic-appearing or diaphoretic.  HENT:     Head: Normocephalic and atraumatic.     Nose: No congestion or rhinorrhea.  Eyes:     Conjunctiva/sclera: Conjunctivae normal.     Pupils: Pupils are equal, round, and reactive to light.  Cardiovascular:     Heart sounds: No murmur heard. Pulmonary:     Effort: Pulmonary effort is normal. No respiratory distress.     Breath sounds: Normal breath sounds. No wheezing, rhonchi or rales.  Chest:     Chest wall: No tenderness.  Abdominal:  Palpations: Abdomen is soft.     Tenderness: There is no abdominal tenderness.  Musculoskeletal:        General: No swelling or tenderness.     Cervical back: Neck supple. No tenderness.  Skin:    General: Skin is warm and dry.     Capillary Refill: Capillary refill takes less than 2 seconds.     Findings: No erythema.  Neurological:     Mental Status: He is alert.     Sensory: Sensory deficit present.     Motor: Weakness present.     Comments: Patient has right facial droop, right arm weakness, right leg weakness.  Numbness on right side.  No blink to threat on  the right.  Patient is less depressed months.  Pupil symmetric and reactive but does not want to look to the right.       (all labs ordered are listed, but only abnormal results are displayed) Labs Reviewed  I-STAT CHEM 8, ED - Abnormal; Notable for the following components:      Result Value   Creatinine, Ser 1.30 (*)    Glucose, Bld 119 (*)    Calcium , Ion 1.03 (*)    TCO2 21 (*)    Hemoglobin 8.8 (*)    HCT 26.0 (*)    All other components within normal limits  CBG MONITORING, ED - Abnormal; Notable for the following components:   Glucose-Capillary 119 (*)    All other components within normal limits  RESP PANEL BY RT-PCR (RSV, FLU A&B, COVID)  RVPGX2  PROTIME-INR  APTT  CBC  DIFFERENTIAL  COMPREHENSIVE METABOLIC PANEL WITH GFR  ETHANOL    EKG: None  Radiology: CT HEAD CODE STROKE WO CONTRAST Result Date: 11/20/2023 EXAM: CT HEAD WITHOUT CONTRAST 11/20/2023 10:11:35 AM TECHNIQUE: CT of the head was performed without the administration of intravenous contrast. Automated exposure control, iterative reconstruction, and/or weight based adjustment of the mA/kV was utilized to reduce the radiation dose to as low as reasonably achievable. COMPARISON: None available. CLINICAL HISTORY: Neuro deficit, acute, stroke suspected. FINDINGS: BRAIN AND VENTRICLES: No acute hemorrhage. Gray-white differentiation is preserved. No hydrocephalus. No extra-axial collection. No mass effect or midline shift. There is mild periventricular white matter disease present. Aspect score is 10. ORBITS: No acute abnormality. SINUSES: No acute abnormality. SOFT TISSUES AND SKULL: No acute soft tissue abnormality. No skull fracture. Note: The above findings were communicated to the ordering clinician Dr. Sal Khaliqdina at 10:15 am Nov 20 2023 via the [facility] paging service. IMPRESSION: 1. No acute intracranial abnormality. 2. Mild periventricular white matter disease. Electronically signed by: evalene coho  11/20/2023 10:22 AM EDT RP Workstation: [id number]     Procedures   Medications Ordered in the ED  sodium chloride  flush (NS) 0.9 % injection 3 mL (has no administration in time range)  iohexol  (OMNIPAQUE ) 350 MG/ML injection 75 mL (75 mLs Intravenous Contrast Given 11/20/23 1013)   CRITICAL CARE Performed by: Lonni PARAS Eryn Marandola Total critical care time: 20 minutes Critical care time was exclusive of separately billable procedures and treating other patients. Critical care was necessary to treat or prevent imminent or life-threatening deterioration. Critical care was time spent personally by me on the following activities: development of treatment plan with patient and/or surrogate as well as nursing, discussions with consultants, evaluation of patient's response to treatment, examination of patient, obtaining history from patient or surrogate, ordering and performing treatments and interventions, ordering and review of laboratory studies, ordering and review of radiographic studies,  pulse oximetry and re-evaluation of patient's condition.                                   Medical Decision Making Amount and/or Complexity of Data Reviewed Labs: ordered. Radiology: ordered.    Allen Macdonald is a 73 y.o. male with a past medical history significant for previous pulmonary embolism and DVT on Eliquis  therapy, hypertension, laryngeal cancer, bladder cancer, prostate cancer, previous vena cava filter, and LAA colopathy who presents as a code stroke.  815 was his time of last normal.  According to EMS report, patient was on the toilet when he started slipping off the toilet but did not fall and hit his head.  He started having some difficulty with his speech and EMS was called.  Upon EMS arrival, patient developed a left-sided gaze preference and started having weakness in his right face right arm and right leg.  Patient became flaccid in the entire right side and his having difficulty with  any speech.  Initially he was talking to his family when it began.  Glucose reportedly was normal and patient otherwise has been doing well without history of stroke.  On arrival patient does appear to have LVO.  He has complete right sided deficits with numbness and weakness in right face right leg and right arm.  He is only looking to the left and would mumble some incomprehensible speech to me.  He does appear to be protecting his airway however and vital signs were reassuring initially.  Breath sounds were clear and he did not have any chest or abdominal tenderness.  Bowel sounds were also appreciated.  Patient quickly taken to CT scanner and there is no evidence of intracranial hemorrhage.  Patient getting CTA which appears to show LVO.  Anticipate admission for further management of acute stroke.  10:19 AM Was made aware that patient is now a code IR for likely thrombectomy.  Patient be admitted for further management after.      Final diagnoses:  Cerebrovascular accident (CVA), unspecified mechanism (HCC)    Clinical Impression: 1. Cerebrovascular accident (CVA), unspecified mechanism (HCC)     Disposition: Admit  This note was prepared with assistance of Dragon voice recognition software. Occasional wrong-word or sound-a-like substitutions may have occurred due to the inherent limitations of voice recognition software.       Unika Nazareno, Lonni PARAS, MD 11/20/23 1025

## 2023-11-20 NOTE — Anesthesia Postprocedure Evaluation (Signed)
 Anesthesia Post Note  Patient: Lemuel Boodram  Procedure(s) Performed: IR PERCUTANEOUS ART THROMBECTOMY/INFUSION INTRACRANIAL INC DIAG ANGIO IR US  GUIDE VASC ACCESS RIGHT IR CT HEAD LTD RADIOLOGY WITH ANESTHESIA     Patient location during evaluation: PACU Anesthesia Type: General Level of consciousness: awake and alert Pain management: pain level controlled Vital Signs Assessment: post-procedure vital signs reviewed and stable Respiratory status: spontaneous breathing, nonlabored ventilation, respiratory function stable and patient connected to nasal cannula oxygen Cardiovascular status: blood pressure returned to baseline and stable Postop Assessment: no apparent nausea or vomiting Anesthetic complications: no   No notable events documented.  Last Vitals:  Vitals:   11/20/23 1230 11/20/23 1245  BP: 136/64 (!) 146/73  Pulse: 66 68  Resp: (!) 23 18  Temp:  36.6 C  SpO2: 96% 97%    Last Pain: There were no vitals filed for this visit.               Infantof Villagomez D Mikkel Charrette

## 2023-11-20 NOTE — Anesthesia Preprocedure Evaluation (Signed)
 Anesthesia Evaluation  Patient identified by MRN, date of birth, ID band Patient confused    Reviewed: Allergy  & Precautions, Patient's Chart, lab work & pertinent test results, Unable to perform ROS - Chart review onlyPreop documentation limited or incomplete due to emergent nature of procedure.  Airway Mallampati: Unable to assess       Dental  (+) Poor Dentition, Missing   Pulmonary neg pulmonary ROS    + decreased breath sounds      Cardiovascular hypertension, Pt. on medications and Pt. on home beta blockers  Rhythm:Regular Rate:Normal  Echo:   1. Left ventricular ejection fraction, by estimation, is 50 to 55%. The  left ventricle has low normal function. The left ventricle has no regional  wall motion abnormalities. Left ventricular diastolic parameters were  normal. The average left ventricular   global longitudinal strain is 14.9 %. The global longitudinal strain is  abnormal.   2. Right ventricular systolic function is normal. The right ventricular  size is normal.   3. Left atrial size was mildly dilated.   4. The mitral valve is degenerative. Mild mitral valve regurgitation. No  evidence of mitral stenosis.   5. The aortic valve is tricuspid. Aortic valve regurgitation is not  visualized. No aortic stenosis is present.   6. Aortic Normal DTA.   7. The inferior vena cava is normal in size with greater than 50%  respiratory variability, suggesting right atrial pressure of 3 mmHg.     Neuro/Psych negative neurological ROS     GI/Hepatic ,GERD  Medicated,,  Endo/Other    Renal/GU      Musculoskeletal   Abdominal   Peds  Hematology   Anesthesia Other Findings   Reproductive/Obstetrics                              Anesthesia Physical Anesthesia Plan  ASA: 4 and emergent  Anesthesia Plan: General   Post-op Pain Management:    Induction: Intravenous  PONV Risk Score and  Plan: 2 and Ondansetron  and Treatment may vary due to age or medical condition  Airway Management Planned: Oral ETT  Additional Equipment: None  Intra-op Plan:   Post-operative Plan:   Informed Consent: I have reviewed the patients History and Physical, chart, labs and discussed the procedure including the risks, benefits and alternatives for the proposed anesthesia with the patient or authorized representative who has indicated his/her understanding and acceptance.     Only emergency history available and History available from chart only  Plan Discussed with: CRNA  Anesthesia Plan Comments: (Systolic BP parameters 120-220. No recurrent labs indicated. )        Anesthesia Quick Evaluation

## 2023-11-20 NOTE — Transfer of Care (Signed)
 Immediate Anesthesia Transfer of Care Note  Patient: Ermal Haberer  Procedure(s) Performed: IR PERCUTANEOUS ART THROMBECTOMY/INFUSION INTRACRANIAL INC DIAG ANGIO IR US  GUIDE VASC ACCESS RIGHT IR CT HEAD LTD RADIOLOGY WITH ANESTHESIA  Patient Location: PACU  Anesthesia Type:General  Level of Consciousness: awake, alert , oriented, and patient cooperative  Airway & Oxygen Therapy: Patient Spontanous Breathing  Post-op Assessment: Report given to RN, Post -op Vital signs reviewed and stable, Post -op Vital signs reviewed and unstable, Anesthesiologist notified, Patient moving all extremities X 4, and Patient able to stick tongue midline  Post vital signs: Reviewed, stable, and unstable--MDA made aware of patient's BP lower than Dr. Edd requested BP parameters. Phenylephrine  gtt started in PACU; Albumin  infusing in PACU.   Last Vitals:  Vitals Value Taken Time  BP 96/67 1216  Temp 36.6 C 11/20/23 12:09  Pulse 78 1216  Resp 21 1216  SpO2 985 1216    Last Pain: There were no vitals filed for this visit.       Complications: No notable events documented.

## 2023-11-21 ENCOUNTER — Inpatient Hospital Stay (HOSPITAL_COMMUNITY)

## 2023-11-21 ENCOUNTER — Encounter (HOSPITAL_COMMUNITY)
Admission: EM | Disposition: A | Discharge status: Rehabilitation Facility | Payer: Self-pay | Source: Home / Self Care | Attending: Neurology

## 2023-11-21 ENCOUNTER — Inpatient Hospital Stay (HOSPITAL_COMMUNITY): Admitting: Certified Registered Nurse Anesthetist

## 2023-11-21 ENCOUNTER — Encounter (HOSPITAL_COMMUNITY): Payer: Self-pay | Admitting: Radiology

## 2023-11-21 DIAGNOSIS — R29718 NIHSS score 18: Secondary | ICD-10-CM | POA: Diagnosis not present

## 2023-11-21 DIAGNOSIS — I63512 Cerebral infarction due to unspecified occlusion or stenosis of left middle cerebral artery: Secondary | ICD-10-CM | POA: Diagnosis not present

## 2023-11-21 DIAGNOSIS — D6869 Other thrombophilia: Secondary | ICD-10-CM | POA: Diagnosis not present

## 2023-11-21 DIAGNOSIS — I951 Orthostatic hypotension: Secondary | ICD-10-CM | POA: Diagnosis not present

## 2023-11-21 DIAGNOSIS — I639 Cerebral infarction, unspecified: Secondary | ICD-10-CM | POA: Diagnosis not present

## 2023-11-21 DIAGNOSIS — I10 Essential (primary) hypertension: Secondary | ICD-10-CM

## 2023-11-21 DIAGNOSIS — I7770 Dissection of unspecified artery: Secondary | ICD-10-CM

## 2023-11-21 DIAGNOSIS — C679 Malignant neoplasm of bladder, unspecified: Secondary | ICD-10-CM

## 2023-11-21 DIAGNOSIS — I6602 Occlusion and stenosis of left middle cerebral artery: Secondary | ICD-10-CM

## 2023-11-21 DIAGNOSIS — E785 Hyperlipidemia, unspecified: Secondary | ICD-10-CM

## 2023-11-21 DIAGNOSIS — I69391 Dysphagia following cerebral infarction: Secondary | ICD-10-CM

## 2023-11-21 DIAGNOSIS — Z86711 Personal history of pulmonary embolism: Secondary | ICD-10-CM

## 2023-11-21 DIAGNOSIS — I6782 Cerebral ischemia: Secondary | ICD-10-CM | POA: Diagnosis not present

## 2023-11-21 HISTORY — PX: IR US GUIDE VASC ACCESS RIGHT: IMG2390

## 2023-11-21 HISTORY — PX: IR PERCUTANEOUS ART THROMBECTOMY/INFUSION INTRACRANIAL INC DIAG ANGIO: IMG6087

## 2023-11-21 HISTORY — PX: RADIOLOGY WITH ANESTHESIA: SHX6223

## 2023-11-21 LAB — COMPREHENSIVE METABOLIC PANEL WITH GFR
ALT: 34 U/L (ref 0–44)
ALT: 27 U/L (ref 0–44)
AST: 25 U/L (ref 15–41)
AST: 23 U/L (ref 15–41)
Albumin: 2.9 g/dL — ABNORMAL LOW (ref 3.5–5.0)
Albumin: 2.5 g/dL — ABNORMAL LOW (ref 3.5–5.0)
Alkaline Phosphatase: 83 U/L (ref 38–126)
Alkaline Phosphatase: 80 U/L (ref 38–126)
Anion gap: 11 (ref 5–15)
Anion gap: 10 (ref 5–15)
BUN: 14 mg/dL (ref 8–23)
BUN: 13 mg/dL (ref 8–23)
CO2: 19 mmol/L — ABNORMAL LOW (ref 22–32)
CO2: 17 mmol/L — ABNORMAL LOW (ref 22–32)
Calcium: 8.4 mg/dL — ABNORMAL LOW (ref 8.9–10.3)
Calcium: 7.6 mg/dL — ABNORMAL LOW (ref 8.9–10.3)
Chloride: 110 mmol/L (ref 98–111)
Chloride: 113 mmol/L — ABNORMAL HIGH (ref 98–111)
Creatinine, Ser: 1.29 mg/dL — ABNORMAL HIGH (ref 0.61–1.24)
Creatinine, Ser: 1.44 mg/dL — ABNORMAL HIGH (ref 0.61–1.24)
GFR, Estimated: 58 mL/min — ABNORMAL LOW (ref >60)
GFR, Estimated: 51 mL/min — ABNORMAL LOW (ref >60)
Glucose, Bld: 107 mg/dL — ABNORMAL HIGH (ref 70–99)
Glucose, Bld: 125 mg/dL — ABNORMAL HIGH (ref 70–99)
Potassium: 4.3 mmol/L (ref 3.5–5.1)
Potassium: 4.2 mmol/L (ref 3.5–5.1)
Sodium: 140 mmol/L (ref 135–145)
Sodium: 140 mmol/L (ref 135–145)
Total Bilirubin: 0.5 mg/dL (ref 0.0–1.2)
Total Bilirubin: 0.4 mg/dL (ref 0.0–1.2)
Total Protein: 6.4 g/dL — ABNORMAL LOW (ref 6.5–8.1)
Total Protein: 5.5 g/dL — ABNORMAL LOW (ref 6.5–8.1)

## 2023-11-21 LAB — CBC
HCT: 26.6 % — ABNORMAL LOW (ref 39.0–52.0)
Hemoglobin: 8.2 g/dL — ABNORMAL LOW (ref 13.0–17.0)
MCH: 29.6 pg (ref 26.0–34.0)
MCHC: 30.8 g/dL (ref 30.0–36.0)
MCV: 96 fL (ref 80.0–100.0)
Platelets: 163 K/uL (ref 150–400)
RBC: 2.77 MIL/uL — ABNORMAL LOW (ref 4.22–5.81)
RDW: 19.6 % — ABNORMAL HIGH (ref 11.5–15.5)
WBC: 9.5 K/uL (ref 4.0–10.5)
nRBC: 0 % (ref 0.0–0.2)

## 2023-11-21 LAB — LIPID PANEL
Cholesterol: 170 mg/dL (ref 0–200)
HDL: 38 mg/dL — ABNORMAL LOW (ref >40)
LDL Cholesterol: 112 mg/dL — ABNORMAL HIGH (ref 0–99)
Total CHOL/HDL Ratio: 4.5 ratio
Triglycerides: 101 mg/dL (ref <150)
VLDL: 20 mg/dL (ref 0–40)

## 2023-11-21 LAB — GLUCOSE, CAPILLARY
Glucose-Capillary: 118 mg/dL — ABNORMAL HIGH (ref 70–99)
Glucose-Capillary: 122 mg/dL — ABNORMAL HIGH (ref 70–99)
Glucose-Capillary: 121 mg/dL — ABNORMAL HIGH (ref 70–99)

## 2023-11-21 SURGERY — RADIOLOGY WITH ANESTHESIA
Anesthesia: General

## 2023-11-21 MED ORDER — CANGRELOR TETRASODIUM 50 MG IV SOLR
INTRAVENOUS | Status: AC
Start: 2023-11-21 — End: 2023-11-21
  Filled 2023-11-21: qty 50

## 2023-11-21 MED ORDER — VERAPAMIL HCL 2.5 MG/ML IV SOLN
INTRAVENOUS | Status: AC | PRN
  Administered 2023-11-21 (×2): 2.5 mg via INTRA_ARTERIAL

## 2023-11-21 MED ORDER — SODIUM CHLORIDE 0.9 % IV SOLN
4.0000 ug/kg/min | INTRAVENOUS | Status: DC
  Administered 2023-11-21 – 2023-11-22 (×8): 4 ug/kg/min via INTRAVENOUS
  Filled 2023-11-21 (×12): qty 50

## 2023-11-21 MED ORDER — SUGAMMADEX SODIUM 200 MG/2ML IV SOLN
INTRAVENOUS | Status: DC | PRN
  Administered 2023-11-21: 200 mg via INTRAVENOUS

## 2023-11-21 MED ORDER — IOHEXOL 350 MG/ML SOLN
75.0000 mL | Freq: Once | INTRAVENOUS | Status: AC | PRN
Start: 2023-11-21 — End: 2023-11-21
  Administered 2023-11-21: 75 mL via INTRAVENOUS

## 2023-11-21 MED ORDER — SODIUM CHLORIDE 0.9 % IV BOLUS
250.0000 mL | INTRAVENOUS | Status: AC | PRN
Start: 2023-11-21 — End: 2023-11-22

## 2023-11-21 MED ORDER — CANGRELOR BOLUS VIA INFUSION
INTRAVENOUS | Status: AC | PRN
  Administered 2023-11-21: 3135 ug via INTRAVENOUS

## 2023-11-21 MED ORDER — ROCURONIUM BROMIDE 10 MG/ML (PF) SYRINGE
PREFILLED_SYRINGE | INTRAVENOUS | Status: DC | PRN
  Administered 2023-11-21: 40 mg via INTRAVENOUS

## 2023-11-21 MED ORDER — SODIUM CHLORIDE 0.9 % IV SOLN: INTRAVENOUS | Status: DC

## 2023-11-21 MED ORDER — IOHEXOL 350 MG/ML SOLN
110.0000 mL | Freq: Once | INTRAVENOUS | Status: AC | PRN
  Administered 2023-11-21: 110 mL via INTRAVENOUS

## 2023-11-21 MED ORDER — CHLORHEXIDINE GLUCONATE CLOTH 2 % EX PADS
6.0000 | MEDICATED_PAD | Freq: Every day | CUTANEOUS | Status: DC
  Administered 2023-11-21 – 2023-11-25 (×5): 6 via TOPICAL

## 2023-11-21 MED ORDER — IOHEXOL 300 MG/ML  SOLN
150.0000 mL | Freq: Once | INTRAMUSCULAR | Status: AC | PRN
  Administered 2023-11-21: 40 mL via INTRA_ARTERIAL

## 2023-11-21 MED ORDER — SODIUM CHLORIDE 0.9 % IV SOLN
1.0000 g | INTRAVENOUS | Status: DC
  Administered 2023-11-21 – 2023-11-25 (×5): 1 g via INTRAVENOUS
  Filled 2023-11-21 (×5): qty 10

## 2023-11-21 MED ORDER — PHENYLEPHRINE HCL-NACL 20-0.9 MG/250ML-% IV SOLN
0.0000 ug/min | INTRAVENOUS | Status: DC
  Administered 2023-11-21: 20 ug/min via INTRAVENOUS

## 2023-11-21 MED ORDER — VERAPAMIL HCL 2.5 MG/ML IV SOLN
INTRAVENOUS | Status: AC
  Filled 2023-11-21: qty 2

## 2023-11-21 MED ORDER — LACTATED RINGERS IV SOLN: INTRAVENOUS | Status: DC | PRN

## 2023-11-21 MED ORDER — SUCCINYLCHOLINE CHLORIDE 200 MG/10ML IV SOSY
PREFILLED_SYRINGE | INTRAVENOUS | Status: DC | PRN
  Administered 2023-11-21: 120 mg via INTRAVENOUS

## 2023-11-21 MED ORDER — CLEVIDIPINE BUTYRATE 0.5 MG/ML IV EMUL: 0.0000 mg/h | INTRAVENOUS | Status: DC

## 2023-11-21 MED ORDER — ATORVASTATIN CALCIUM 80 MG PO TABS
80.0000 mg | ORAL_TABLET | Freq: Every day | ORAL | Status: DC
  Administered 2023-11-23 – 2023-11-25 (×3): 80 mg via ORAL
  Filled 2023-11-21 (×3): qty 1

## 2023-11-21 MED ORDER — PROPOFOL 10 MG/ML IV BOLUS
INTRAVENOUS | Status: DC | PRN
  Administered 2023-11-21: 120 mg via INTRAVENOUS

## 2023-11-21 MED ORDER — PHENYLEPHRINE HCL-NACL 20-0.9 MG/250ML-% IV SOLN
INTRAVENOUS | Status: DC | PRN
  Administered 2023-11-21: 55 ug/min via INTRAVENOUS

## 2023-11-21 MED ORDER — PHENYLEPHRINE 80 MCG/ML (10ML) SYRINGE FOR IV PUSH (FOR BLOOD PRESSURE SUPPORT)
PREFILLED_SYRINGE | INTRAVENOUS | Status: DC | PRN
  Administered 2023-11-21: 120 ug via INTRAVENOUS

## 2023-11-21 MED ORDER — ONDANSETRON HCL 4 MG/2ML IJ SOLN
INTRAMUSCULAR | Status: DC | PRN
  Administered 2023-11-21: 4 mg via INTRAVENOUS

## 2023-11-21 MED ORDER — SODIUM CHLORIDE 0.9 % IV SOLN
INTRAVENOUS | Status: AC | PRN
  Administered 2023-11-21: 4 ug/kg/min via INTRAVENOUS

## 2023-11-21 NOTE — Progress Notes (Signed)
 BLE venous duplex has been completed.  Preliminary results given to Dr. Jerri.   Results can be found under chart review under CV PROC. 11/21/2023 5:35 PM Kyonna Frier RVT, RDMS

## 2023-11-21 NOTE — Progress Notes (Addendum)
 STROKE TEAM PROGRESS NOTE    SIGNIFICANT HOSPITAL EVENTS 8/3 presented from home with right-sided weakness, left gaze and aphasia.  He underwent thrombectomy.  Had stent retriever assisted mechanical thrombectomy of left carotid T and M1 occlusions with TICI 2C  INTERIM HISTORY/SUBJECTIVE No family at the bedside.  This morning while getting up to the bedside commode and and he had an acute neurological change with slurred speech, aphasia, and a leftward gaze.  He was noted to be orthostatic blood pressure was low 100s, he was assisted back to the bed and laid flat and given 1 L normal saline bolus.  He did not have any neurological improvement as his blood pressure improved.  He was taken for stat CT head and CTA head and neck with perfusion which revealed left M1 occlusion,Infarct core within the left temporal lobe and penumbra involving the temporal, frontal, and parietal lobe Code IR was activated at 1035 and had a left M1 thrombectomy due to dissection of left M1 with TICI 2B vascularization.  He was started on cangrelor  drip After IR patient still with no improvement in exam still with right arm flaccid and aphasia and had a repeat CTA head and neck which revealed no LVO   CBC    Component Value Date/Time   WBC 9.5 11/21/2023 1430   RBC 2.77 (L) 11/21/2023 1430   HGB 8.2 (L) 11/21/2023 1430   HGB 9.0 (L) 07/15/2023 1309   HGB 13.5 04/22/2017 1023   HCT 26.6 (L) 11/21/2023 1430   HCT 39.2 04/22/2017 1023   PLT 163 11/21/2023 1430   PLT 170 07/15/2023 1309   PLT 164 04/22/2017 1023   MCV 96.0 11/21/2023 1430   MCV 95 04/22/2017 1023   MCH 29.6 11/21/2023 1430   MCHC 30.8 11/21/2023 1430   RDW 19.6 (H) 11/21/2023 1430   RDW 14.0 04/22/2017 1023   LYMPHSABS 0.8 11/20/2023 0959   MONOABS 0.5 11/20/2023 0959   EOSABS 0.2 11/20/2023 0959   BASOSABS 0.0 11/20/2023 0959    BMET    Component Value Date/Time   NA 140 11/21/2023 1430   NA 139 07/05/2019 1045   K 4.2 11/21/2023  1430   CL 113 (H) 11/21/2023 1430   CO2 17 (L) 11/21/2023 1430   GLUCOSE 125 (H) 11/21/2023 1430   BUN 13 11/21/2023 1430   BUN 11 07/05/2019 1045   CREATININE 1.44 (H) 11/21/2023 1430   CALCIUM  7.6 (L) 11/21/2023 1430   GFRNONAA 51 (L) 11/21/2023 1430    IMAGING past 24 hours IR PERCUTANEOUS ART THROMBECTOMY/INFUSION INTRACRANIAL INC DIAG ANGIO Result Date: 11/21/2023 INDICATION: Acute Large Vessel occlusion, left middle cerebral artery occlusion EXAM: Mechanical thrombectomy, CONSENT: Emergency consent was obtained from the patient's wife as the patient was unable to consent for themselves and family not immediately available. Dr. Ary Cummins and I agreed the procedure was emergent. MEDICATIONS: Intravenous Cangrelor , intra arterial verapamil  5 mg ANESTHESIA/SEDATION: General CONTRAST:  Forty ml Isovue 370 FLUOROSCOPY: Fluoroscopy time: 3 minutes 54 seconds Radiation Exposure Index (as provided by the fluoroscopic device): 72.05 MGy reference air Kerma COMPLICATIONS: None immediate. TECHNIQUE: Maximal Sterile Barrier Technique was utilized including caps, mask, sterile gowns, sterile gloves, sterile drape, hand hygiene and skin antiseptic. A timeout was performed prior to the initiation of the procedure. Vessel catheterized: Left common carotid, left internal carotid FINDINGS: The carotid bifurcation is normal. There is an occlusion of the left middle cerebral artery. The anterior cerebral artery is normal. Good collaterals. PROCEDURE: Femoral access was obtained  with ultrasound using direct visualization of the needle puncture into the artery. A 90 cm neuron max sheath was placed. The non MAC she was advanced over a 5 [place] penumbra select parents teen into the left internal carotid artery. The read 72 SENDIT catheter was advanced over a synchro support wire to the level of the occlusion and connected to suction. The catheter was then withdrawn under aspiration. This resulted in removal of the  embolus. The follow-up arteriogram demonstrated a intimal flap within the left middle cerebral artery M1 segment. 5 mg of verapamil  were administered intra-arterially. A repeat image was obtained which clearly demonstrates a dissection flap in the distal middle cerebral artery M1 segment. It appears to extend to both the inferior and superior divisions of the middle cerebral artery. The artery was observed for about 20 minutes after initiation of Cangrelor . There was no slowing of the flow. All of the proximal branches were patent, there was slow flow distally within several of the branches involving both the anterior and posterior middle cerebral artery divisions. I did not think the stent would likely improve the situation and would risk thrombosis of the artery and potentially 1 of the 2 divisions. Hemostasis was obtained with Angio-Seal. 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 Flow was restored with aspiration thrombectomy. Initiation Cangrelor  in attempt to maintain vessel patency PLAN: Return to ICU. Continue intravenous Cangrelor  24 hours until repeat imaging Electronically Signed   By: Nancyann Burns M.D.   On: 11/21/2023 15:02   IR US  Guide Vasc Access Right Result Date: 11/21/2023 INDICATION: Acute Large Vessel occlusion, left middle cerebral artery occlusion EXAM: Mechanical thrombectomy, CONSENT: Emergency consent was obtained from the patient's wife as the patient was unable to consent for themselves and family not immediately available. Dr. Ary Cummins and I agreed the procedure was emergent. MEDICATIONS: Intravenous Cangrelor , intra arterial verapamil  5 mg ANESTHESIA/SEDATION: General CONTRAST:  Forty ml Isovue 370 FLUOROSCOPY: Fluoroscopy time: 3 minutes 54 seconds Radiation Exposure Index (as provided by the fluoroscopic device): 72.05 MGy reference air Kerma COMPLICATIONS: None immediate. TECHNIQUE: Maximal Sterile Barrier  Technique was utilized including caps, mask, sterile gowns, sterile gloves, sterile drape, hand hygiene and skin antiseptic. A timeout was performed prior to the initiation of the procedure. Vessel catheterized: Left common carotid, left internal carotid FINDINGS: The carotid bifurcation is normal. There is an occlusion of the left middle cerebral artery. The anterior cerebral artery is normal. Good collaterals. PROCEDURE: Femoral access was obtained with ultrasound using direct visualization of the needle puncture into the artery. A 90 cm neuron max sheath was placed. The non MAC she was advanced over a 5 [place] penumbra select parents teen into the left internal carotid artery. The read 72 SENDIT catheter was advanced over a synchro support wire to the level of the occlusion and connected to suction. The catheter was then withdrawn under aspiration. This resulted in removal of the embolus. The follow-up arteriogram demonstrated a intimal flap within the left middle cerebral artery M1 segment. 5 mg of verapamil  were administered intra-arterially. A repeat image was obtained which clearly demonstrates a dissection flap in the distal middle cerebral artery M1 segment. It appears to extend to both the inferior and superior divisions of the middle cerebral artery. The artery was observed for about 20 minutes after initiation of Cangrelor . There was no slowing of the flow. All of the proximal branches were patent, there was slow flow distally within several of  the branches involving both the anterior and posterior middle cerebral artery divisions. I did not think the stent would likely improve the situation and would risk thrombosis of the artery and potentially 1 of the 2 divisions. Hemostasis was obtained with Angio-Seal. 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 Flow was restored with aspiration thrombectomy. Initiation Cangrelor  in attempt to  maintain vessel patency PLAN: Return to ICU. Continue intravenous Cangrelor  24 hours until repeat imaging Electronically Signed   By: Nancyann Burns M.D.   On: 11/21/2023 15:02   CT ANGIO HEAD NECK W WO CM Result Date: 11/21/2023 EXAM: CTA HEAD AND NECK WITH AND WITHOUT 11/21/2023 01:49:04 PM TECHNIQUE: CTA of the head and neck was performed with and without the administration of intravenous contrast. Multiplanar 2D and/or 3D reformatted images are provided for review. Automated exposure control, iterative reconstruction, and/or weight based adjustment of the mA/kV was utilized to reduce the radiation dose to as low as reasonably achievable. Stenosis of the internal carotid arteries measured using NASCET criteria. COMPARISON: CT angiogram of the head and neck and CT perfusion of the head dated 11/20/2000. CLINICAL HISTORY: Stroke/TIA, determine embolic source. FINDINGS: CTA NECK: AORTIC ARCH AND ARCH VESSELS: Minimal calcific atheromatous disease within the aortic arch. No dissection or arterial injury. No significant stenosis of the brachiocephalic or subclavian arteries. CERVICAL CAROTID ARTERIES: Mild calcific/noncalcific plaque within the origin of the right internal carotid artery, with no appreciable stenosis. Mild calcific plaque within the origin of the left internal carotid artery, also with no significant stenosis. No dissection or arterial injury. CERVICAL VERTEBRAL ARTERIES: The left vertebral artery is dominant. Both vertebral arteries are normal in caliber throughout their respective courses. No dissection, arterial injury, or significant stenosis. LUNGS AND MEDIASTINUM: Unremarkable. SOFT TISSUES: No acute abnormality. BONES: No acute abnormality. CTA HEAD: ANTERIOR CIRCULATION: Mild generalized cerebral volume loss and mild periventricular white matter disease. Calcification within the carotid siphons bilaterally and mild diffuse stenosis of the clinoid and cavernous segments of the right internal  carotid artery. The endoluminal filling defect previously seen at the left ICA terminus is no longer present. The M1 segment of the left middle cerebral artery has recanalized in the interim and demonstrates moderate stenosis distally. It is also diffusely irregular. The M2 branches are patent. The right A1 segment is diminutive and diffusely irregular. No aneurysm. POSTERIOR CIRCULATION: No significant stenosis of the posterior cerebral arteries. No significant stenosis of the basilar artery. No significant stenosis of the vertebral arteries. No aneurysm. OTHER: No dural venous sinus thrombosis on this non-dedicated study. IMPRESSION: 1. No large vessel occlusion or aneurysm in the head or neck. There has been interval recanalization of the M1 segment of the left middle cerebral artery. 2. Moderate stenosis distally in the recanalized M1 segment of the left middle cerebral artery, which is also diffusely irregular. Electronically signed by: evalene coho 11/21/2023 02:39 PM EDT RP Workstation: [id number]   CT ANGIO HEAD NECK W WO CM W PERF Result Date: 11/21/2023 EXAM: CTA Head and Neck with Perfusion 11/21/2023 10:14:48 AM TECHNIQUE: CTA of the head and neck was performed with the administration of intravenous contrast. 3D postprocessing with multiplanar reconstructions and MIPs was performed to evaluate the vascular anatomy. Automated exposure control, iterative reconstruction, and/or weight based adjustment of the mA/kV was utilized to reduce the radiation dose to as low as reasonably achievable. COMPARISON: CT angiogram of the head and neck dated 11/20/2023. CLINICAL HISTORY: Post thrombectomy worsening aphasia  and right arm weakness. FINDINGS: CTA NECK: AORTIC ARCH AND ARCH VESSELS: No dissection or arterial injury. No significant stenosis of the brachiocephalic or subclavian arteries. CERVICAL CAROTID ARTERIES: Mild calcific plaque present within the origin of the left internal carotid artery, with no  luminal stenosis. CERVICAL VERTEBRAL ARTERIES: No dissection, arterial injury, or significant stenosis. LUNGS AND MEDIASTINUM: Unremarkable. SOFT TISSUES: No acute abnormality. BONES: No acute abnormality. CTA HEAD: ANTERIOR CIRCULATION: Calcific plaque present within the carotid siphons bilaterally. There is an endoluminal filling defect within the left ICA terminus. The left M1 branch is completely occluded at its origin. There is reconstitution of the M2 branches via collaterals. The left A1 is widely patent. POSTERIOR CIRCULATION: No significant stenosis of the posterior cerebral arteries. No significant stenosis of the basilar artery. No significant stenosis of the vertebral arteries. No aneurysm. OTHER: No dural venous sinus thrombosis on this non-dedicated study. CT PERFUSION: EXAM QUALITY: Exam quality is adequate with diagnostic perfusion maps. No significant motion artifact. Appropriate arterial inflow and venous outflow curves. CORE INFARCT (CBF<30% volume): 4 mL TOTAL HYPOPERFUSION (Tmax>6s volume): 47 mL PENUMBRA: Mismatch volume: 43 mL Mismatch ratio: 11.8 Location: The infarct core is within the left temporal lobe and the penumbra involves the temporal, frontal and parietal lobes. IMPRESSION: 1. Left M1 branch occlusion at its origin with reconstitution of the M2 branches via collaterals. 2. Infarct core within the left temporal lobe and penumbra involving the temporal, frontal, and parietal lobes. 3. The above findings were communicated to ordering clinician Karna Geralds via [employer] paging at 10:51 AM 11/21/2023. Electronically signed by: evalene coho 11/21/2023 10:53 AM EDT RP Workstation: [id number]   CT HEAD WO CONTRAST ( ) Result Date: 11/21/2023 EXAM: CT HEAD WITHOUT 11/21/2023 10:14:48 AM TECHNIQUE: CT of the head was performed without the administration of intravenous contrast. Automated exposure control, iterative reconstruction, and/or weight based adjustment of the mA/kV was utilized  to reduce the radiation dose to as low as reasonably achievable. COMPARISON: MRI of the head dated 11/21/2023. CLINICAL HISTORY: Stroke, follow up; worsening exam with aphasia and right arm weakness. FINDINGS: BRAIN AND VENTRICLES: No mass effect or midline shift. No extra-axial fluid collection. Gray-white differentiation is maintained. No hydrocephalus. Age-related cerebral volume loss present. Mild-to-moderate periventricular and subcortical white matter disease. Focal area of increased density within the cortex of the left mid frontal lobe seen on image 20 of series 3, which may represent contrast staining from earlier CT angiogram. The brain is otherwise unremarkable. ORBITS: Patient is status post bilateral lens replacement. SINUSES AND MASTOIDS: Mild mucosal disease within the maxillary and sphenoid sinuses. SOFT TISSUES AND SKULL: No acute skull fracture. No acute soft tissue abnormality. VASCULATURE: Moderate calcific atheromatous disease within the carotid siphons. IMPRESSION: 1. No acute intracranial abnormality. 2. Age-related cerebral volume loss and mild-to-moderate periventricular and subcortical white matter disease. 3. Focal area of increased density within the cortex of the left mid frontal lobe, possibly representing contrast staining from earlier CT angiogram. Electronically signed by: Isahia berrigan 11/21/2023 10:23 AM EDT RP Workstation: [id number]   MR BRAIN WO CONTRAST Result Date: 11/21/2023 EXAM: MRI BRAIN WITHOUT CONTRAST 11/21/2023 01:09:07 AM TECHNIQUE: Multiplanar multisequence MRI of the head/brain was performed without the administration of intravenous contrast. COMPARISON: None available. CLINICAL HISTORY: Stroke, follow up. FINDINGS: BRAIN AND VENTRICLES: There are multiple scattered small foci of abnormal diffusion restriction indicating acute ischemia within the left MCA (middle cerebral artery) territory, greatest in the frontal lobe. Additionally, there are punctate foci of  acute ischemia in  the right occipital lobe and right frontal white matter. Multifocal hyperintense T2-weighted signal within the cerebral white matter, most commonly due to chronic small vessel disease. No intracranial hemorrhage. No mass. No midline shift. No hydrocephalus. The sella is unremarkable. Normal flow voids. ORBITS: No acute abnormality. SINUSES AND MASTOIDS: No acute abnormality. BONES AND SOFT TISSUES: Normal marrow signal. No acute soft tissue abnormality. IMPRESSION: 1. Multiple scattered small foci of acute ischemia within the left MCA territory, greatest in the frontal lobe. 2. Punctate foci of acute ischemia in the right occipital lobe and right frontal white matter. 3. Multifocal hyperintense T2-weighted signal within the cerebral white matter, most commonly due to chronic small vessel disease. Electronically signed by: Franky Stanford MD 11/21/2023 01:43 AM EDT RP Workstation: [id number]    Vitals:   11/21/23 1330 11/21/23 1400 11/21/23 1415 11/21/23 1500  BP: (!) 140/63 129/69 137/64 134/78  Pulse: 98 90 86 96  Resp: (!) 24 (!) 25 14 16   Temp:      TempSrc:      SpO2: 97% 99% 99% 98%  Weight:      Height:         PHYSICAL EXAM General: Critically ill elderly male in no apparent distress  psych:  Mood and affect appropriate for situation CV: Regular rate and rhythm on monitor Respiratory:  Regular, unlabored respirations on room air GI: Abdomen soft and nontender   NEURO:  Mental Status: Awake and alert.  He is globally aphasic can occasionally follow some very simple commands.  Speech is dysarthric and garbled  Cranial Nerves:  II: PERRL. Visual fields with right hemianopia III, IV, VI: EOM with left gaze preference, can get to midline but cannot cross V: Sensation is intact to light touch and symmetrical to face.  VII: Right facial droop VIII: hearing intact to voice. IX, X: Palate elevates symmetrically. Phonation is normal.  KP:Dynloizm shrug 5/5. XII: tongue  is midline without fasciculations. Motor: Right arm flaccid with minimal movement, right leg with drift, left arm and left leg antigravity and spontaneous movement Tone: is normal and bulk is normal Sensation-decreased on right arm, withdraws on right leg.  Has right neglect Coordination: FTN intact bilaterally, HKS: no ataxia in BLE.No drift.  Gait- deferred  Most Recent NIH  1a Level of Conscious.:  1b LOC Questions: 2 1c LOC Commands: 1 2 Best Gaze: 1 3 Visual: 1 4 Facial Palsy: 1 5a Motor Arm - left: 0 5b Motor Arm - Right: 4 6a Motor Leg - Left:  6b Motor Leg - Right: 2 7 Limb Ataxia: 0 8 Sensory: 1 9 Best Language: 2 10 Dysarthria: 1 11 Extinct. and Inatten.: 1 TOTAL: 18   ASSESSMENT/PLAN  Mr. Elvert Brum is a 73 y.o. male with history of HTN, DVT/PE on eliquis , bladder cancer s/p nephrostomy  admitted for right-sided weakness, left gaze and aphasia .  NIH on Admission 24  Stroke:  left MCA scattered infarcts with left tICA and M1 occlusion s/p IR with TICI2c Etiology: Likely due to hypercoagulable state in the setting with advanced cancer Code Stroke CT head No acute abnormality.   CTA head & neck 8/2 Positive for Left ICA terminus Occlusion with decreased L MCA flow S/p IR with left tICA and M1 occlusion with TICI2c MRI 8/3 Multiple scattered small foci of acute ischemia within the left MCA territory, greatest in the frontal lobe. Punctate foci of acute ischemia in the right occipital lobe and right frontal white matter. 2D Echo ordered US  LE  positive for age indeterminate DVTs: RT - PopV and PTV LT - CFV, PFV, FV, PopV, Gastrocs  TCD bubble study pending LDL 112 HgbA1c ordered VTE prophylaxis - SCDs Eliquis  prior to admission, now on on cangrelor  drip Therapy recommendations:  CIR Disposition: Pending  Recurrent stroke: possible extension of left MCA infarcts with left M1 re-occlusion due to dissection and hypotension. Acute neuro change with PT and sitting  up, BP was low, received head of bed flat and IVF, BP improved but still has worsening left MCA syndrome Repeat CT head 8/3 - no acute process Repeat CTA head and neck with Left M1 branch occlusion at its origin  CTP core infarct 4cc, mismatch 43 cc Repeat IR with TICI2b Post IR repeat CTA showed interval recanalization of the M1 segment of the left MCA. Moderate stenosis distally in the recanalized M1 segment of the left MCA, which is also diffusely irregular. Pt continued to have severe left MCA syndrome On Cangrelor  IV  History of DVT/PE Hx of left DVT with PE in 2019 Home Meds: Eliquis  2.5 US  LE positive for age indeterminate DVTs: RT - PopV and PTV LT - CFV, PFV, FV, PopV, Gastrocs  Eliquis  on hold due to acute stroke currently on Cangrelor    Hx of hypertension Orthostatic hypotension Home meds: Amlodipine  5 mg, carvedilol  12.5 mg Orthostasis 8/4 am with low BP on sitting, improved with supine and IVF Off cleviprex  and Neo BP stable now Avoid low BP Long term BP goal normotensive  Hyperlipidemia Home meds: None LDL 112, goal < 70 Add atorvastatin  80 once po access Continue statin at discharge  Bladder cancer s/p bilateral nephrostomy tubes AKI UTI Fever Poorly differentiated bladder cancer, now stage IV, metastatic to the bone by MRI of the spine on 11/08/2023.  Cr 1.3 -1.29-1.44 monitor  UA WBC > 50 Urine culture pending Rocephin  started Fever Tmax 101.5  Anemia Hgb 9.0-8.8-8.2 will monitor  Likely related to chronic disease  Dysphagia Patient has post-stroke dysphagia Now NPO Likely needs cortrak on Wednesday On IVF now  Other Stroke Risk Factors Obesity, Body mass index is 33.06 kg/m., BMI >/= 30 associated with increased stroke risk, recommend weight loss, diet and exercise as appropriate   Other acute medical issues Hx of prostate cancer, on ADT H/o laryngeal cancer in 2021. S/p radiation therapy.   Karna Geralds DNP, ACNPC-AG  [facility]  ATTENDING NOTE: I reviewed above note and agree with the assessment and plan. Pt was seen and examined.   No family at the bedside. Pt awake, alert, eyes open, aphasia, not able to answer orientation questions, mumbling words intangible. Able to follow some midline commands but not peripheral commands. Not able to name and repeat. Left gaze preference but able to cross midline with right gaze but not able to maintain right gaze, not blinking to visual threat on the right but inconsistently blinking to the left. Mild R facial droop. Tongue protrusion not cooperative. LUE and LLE at least 4/5, RUE drift to bed within 10 sec and RLE drift to bed within 5 sec. Sensation, coordination not cooperative and gait not tested.   For detailed assessment and plan, please refer to above as I have made changes wherever appropriate.   I had long discussion with wife at bedside, updated pt current condition, treatment plan and potential prognosis, and answered all the questions. She expressed understanding and appreciation. I also discussed with Dr. Lester extensively   Ary Cummins, MD PhD Stroke Neurology 11/21/2023 7:03 PM  This patient is critically ill due to stroke and recurrent stroke with LVO, status post IR x 2, orthostatic hypotension, DVT, bladder cancer with metastasis and at significant risk of neurological worsening, death form recurrent stroke, hemorrhagic termination, syncope, PE, seizure. This patient's care requires constant monitoring of vital signs, hemodynamics, respiratory and cardiac monitoring, review of multiple databases, neurological assessment, discussion with family, other specialists and medical decision making of high complexity. I spent 60 minutes of neurocritical care time in the care of this patient.    To contact Stroke Continuity provider, please refer to [email or web address]. After hours, contact General Neurology

## 2023-11-21 NOTE — Progress Notes (Signed)
 Delay on receiving medication, therefore Cangrelor  gtt went dry and patient was off it for almost an hour. On call Stroke, Dr. Michaela made aware of situation.   2014-Requested Replacement Cangrelor  Bag from pharmacy. This nurse had already checked med bins and tube station.   2051-Bag ran dry.  2053-Pharmacy messaged back, stated There were 2 bags made and sent to the floor.  Please double check unit med room and let us  know if you cannot locate.  thanks  2056-Called Pharmacy. Spoke to them about gtt running dry and no replacement bag.  2115-Still no replacement bag. Called Pharmacy, said they were in the process of making it.    2141-Received Cangrelor  bag.   2144-Restarted Cangrelor  gtt

## 2023-11-21 NOTE — Transfer of Care (Signed)
 Immediate Anesthesia Transfer of Care Note  Patient: Allen Macdonald  Procedure(s) Performed: RADIOLOGY WITH ANESTHESIA  Patient Location: PACU  Anesthesia Type:General  Level of Consciousness: drowsy  Airway & Oxygen Therapy: Patient Spontanous Breathing and Patient connected to nasal cannula oxygen  Post-op Assessment: Report given to RN and Post -op Vital signs reviewed and stable  Post vital signs: Reviewed and stable  Last Vitals:  Vitals Value Taken Time  BP 126/64 11/21/23 11:55  Temp    Pulse 101 11/21/23 11:56  Resp 22 11/21/23 11:56  SpO2 100 % 11/21/23 11:56  Vitals shown include unfiled device data.  Last Pain:  Vitals:   11/21/23 0800  TempSrc:   PainSc: 0-No pain         Complications: No notable events documented.

## 2023-11-21 NOTE — Anesthesia Preprocedure Evaluation (Signed)
 Anesthesia Evaluation  Patient identified by MRN, date of birth, ID band Patient awake    Reviewed: Allergy  & Precautions, Patient's Chart, lab work & pertinent test results, Unable to perform ROS - Chart review only  Airway Mallampati: Unable to assess  TM Distance: >3 FB     Dental   Pulmonary PE   Pulmonary exam normal        Cardiovascular hypertension, Pt. on home beta blockers and Pt. on medications + DVT  Normal cardiovascular exam   '24 TTE - EF 50 to 55%. Left atrial size was mildly dilated. Mild mitral valve regurgitation.     Neuro/Psych CVA    GI/Hepatic ,,,(+)     substance abuse  alcohol use  Endo/Other   Obesity   Renal/GU     Bladder cancer     Musculoskeletal   Abdominal   Peds  Hematology  On eliquis     Anesthesia Other Findings Hx laryngeal SCC   Reproductive/Obstetrics                              Anesthesia Physical Anesthesia Plan  ASA: 4 and emergent  Anesthesia Plan: General   Post-op Pain Management: Minimal or no pain anticipated   Induction: Intravenous and Rapid sequence  PONV Risk Score and Plan: 2 and Treatment may vary due to age or medical condition and Ondansetron   Airway Management Planned: Oral ETT  Additional Equipment: None  Intra-op Plan:   Post-operative Plan: Possible Post-op intubation/ventilation  Informed Consent:   Plan Discussed with: CRNA and Anesthesiologist  Anesthesia Plan Comments:         Anesthesia Quick Evaluation

## 2023-11-21 NOTE — Telephone Encounter (Signed)
 [name] calling to report that Allen Macdonald is in [place]. He had a stroke. She would like a call for Dr Jarold when he gets a chance please.

## 2023-11-21 NOTE — Brief Op Note (Signed)
  NEUROSURGERY BRIEF OPERATIVE  NOTE   PREOP DX: Left M1 occlusion  POSTOP DX: Left M1 occlusion due to dissection of left MCA M1.    PROCEDURE: thrombectomy  SURGEON: Nancyann LULLA Burns   ANESTHESIA: GETA  EBL: Minimal  SPECIMENS: None  COMPLICATIONS: None  CONDITION: Stable to recovery  FINDINGS (Full report in [facility]): 1. Left M1 dissection, Intimal flap extends into both M2 divisions.  TICI 2B post; competing flow from pial collaterals.   Nancyann LULLA Burns  @today @ 11:42 AM

## 2023-11-21 NOTE — TOC CAGE-AID Note (Signed)
 Transition of Care [facility]) - CAGE-AID Screening   Patient Details  Name: Allen Macdonald MRN: [id number] Date of Birth: 12-10-1950  Transition of Care [facility]) CM/SW Contact:    Dewain Platz E Debraann Livingstone, LCSW Phone Number: 11/21/2023, 2:46 PM   Clinical Narrative:    CAGE-AID Screening: Substance Abuse Screening unable to be completed due to: : Patient unable to participate

## 2023-11-21 NOTE — Progress Notes (Signed)
 SLP Cancellation Note  Patient Details Name: Allen Macdonald MRN: [id number] DOB: 1950-11-27   Cancelled treatment:       Reason Eval/Treat Not Completed: Patient not medically ready   Raine Blodgett, Consuelo Fitch 11/21/2023, 11:13 AM

## 2023-11-21 NOTE — Progress Notes (Signed)

## 2023-11-21 NOTE — Evaluation (Signed)
 Physical Therapy Evaluation Patient Details Name: Allen Macdonald MRN: [id number] DOB: 1951-01-24 Today's Date: 11/21/2023  History of Present Illness  73 y.o. male presents to [facility] on 11/20/2023 with R weakness, L gaze and expressive aphasia. CTA with L ICA occlusion, pt underwent thrombectomy on 8/3. PMH includes HTN, DVT/PE, bladder CA s/p nephrostomy.  Clinical Impression  Pt presents to PT with deficits in functional mobility, gait, balance, endurance, cognition, strength. PT notes mild R weakness. Pt is able to stand and pivot, declining ambulation attempts due to reports of dizziness with subsequent standing attempts. PT notes deficits in attention, with the pt having a difficult time processing to answer open ended questions not involving long term memory. Patient will benefit from intensive inpatient follow-up therapy, >3 hours/day.        If plan is discharge home, recommend the following: A little help with walking and/or transfers;A lot of help with bathing/dressing/bathroom;Assistance with cooking/housework;Direct supervision/assist for medications management;Direct supervision/assist for financial management;Assist for transportation;Help with stairs or ramp for entrance;Supervision due to cognitive status   Can travel by private vehicle        Equipment Recommendations Rolling walker (2 wheels);BSC/3in1  Recommendations for Other Services  Rehab consult    Functional Status Assessment Patient has had a recent decline in their functional status and demonstrates the ability to make significant improvements in function in a reasonable and predictable amount of time.     Precautions / Restrictions Precautions Precautions: Fall Restrictions Weight Bearing Restrictions Per Provider Order: No      Mobility  Bed Mobility Overal bed mobility: Needs Assistance Bed Mobility: Supine to Sit     Supine to sit: Min assist, HOB elevated          Transfers Overall  transfer level: Needs assistance Equipment used:  (IV pole) Transfers: Sit to/from Stand, Bed to chair/wheelchair/BSC Sit to Stand: Contact guard assist   Step pivot transfers: Contact guard assist       General transfer comment: pt transfers from bed to recliner, later recline rto BSC    Ambulation/Gait Ambulation/Gait assistance: Contact guard assist Gait Distance (Feet): 3 Feet Assistive device: IV Pole Gait Pattern/deviations: Step-to pattern Gait velocity: reduced Gait velocity interpretation: <1.31 ft/sec, indicative of household ambulator   General Gait Details: pt with slowed step-to gait, increased trunk flexion with BUE support of IV pole  Stairs            Wheelchair Mobility     Tilt Bed    Modified Rankin (Stroke Patients Only) Modified Rankin (Stroke Patients Only) Pre-Morbid Rankin Score: No symptoms Modified Rankin: Moderately severe disability     Balance Overall balance assessment: Needs assistance Sitting-balance support: Single extremity supported, Feet supported Sitting balance-Leahy Scale: Poor     Standing balance support: Bilateral upper extremity supported, Reliant on assistive device for balance Standing balance-Leahy Scale: Poor                               Pertinent Vitals/Pain Pain Assessment Pain Assessment: No/denies pain    Home Living Family/patient expects to be discharged to:: Private residence Living Arrangements: Spouse/significant other Available Help at Discharge: Family;Available 24 hours/day (spouse is legally blind per the patient) Type of Home: House Home Access: Stairs to enter Entrance Stairs-Rails: Can reach both Entrance Stairs-Number of Steps: 3   Home Layout: One level Home Equipment: Cane - single point;Toilet riser;Rolling Walker (2 wheels);Rollator (4 wheels);Wheelchair - Manufacturing systems engineer  Prior Function Prior Level of Function : Independent/Modified Independent;Driving                      Extremity/Trunk Assessment   Upper Extremity Assessment Upper Extremity Assessment: RUE deficits/detail RUE Deficits / Details: ROM WFL, grossly 4/5    Lower Extremity Assessment Lower Extremity Assessment: RLE deficits/detail RLE Deficits / Details: ROM WFL, grossly 4/5    Cervical / Trunk Assessment Cervical / Trunk Assessment: Normal  Communication   Communication Communication: Impaired Factors Affecting Communication: Difficulty expressing self    Cognition Arousal: Alert Behavior During Therapy: Flat affect   PT - Cognitive impairments: Awareness, Attention, Problem solving                         Following commands: Impaired Following commands impaired: Follows multi-step commands inconsistently     Cueing Cueing Techniques: Verbal cues, Visual cues, Tactile cues     General Comments General comments (skin integrity, edema, etc.): VSS on RA, pt reports dizziness with standing, PT notes BP drops from 147/75 in supine to 111/66 after transfer to recliner. Pt also with tachycardia into 120s when mobilizing    Exercises     Assessment/Plan    PT Assessment Patient needs continued PT services  PT Problem List Decreased strength;Decreased activity tolerance;Decreased balance;Decreased mobility;Decreased knowledge of use of DME;Decreased safety awareness;Decreased knowledge of precautions;Decreased cognition       PT Treatment Interventions DME instruction;Gait training;Stair training;Functional mobility training;Therapeutic activities;Therapeutic exercise;Balance training;Neuromuscular re-education;Cognitive remediation;Patient/family education    PT Goals (Current goals can be found in the Care Plan section)  Acute Rehab PT Goals Patient Stated Goal: to return to independence PT Goal Formulation: With patient Time For Goal Achievement: 12/05/23 Potential to Achieve Goals: Fair    Frequency Min 3X/week     Co-evaluation                AM-PAC PT 6 Clicks Mobility  Outcome Measure Help needed turning from your back to your side while in a flat bed without using bedrails?: A Little Help needed moving from lying on your back to sitting on the side of a flat bed without using bedrails?: A Little Help needed moving to and from a bed to a chair (including a wheelchair)?: A Little Help needed standing up from a chair using your arms (e.g., wheelchair or bedside chair)?: A Little Help needed to walk in hospital room?: Total Help needed climbing 3-5 steps with a railing? : Total 6 Click Score: 14    End of Session Equipment Utilized During Treatment: Gait belt Activity Tolerance: Patient tolerated treatment well Patient left: in chair;with call bell/phone within reach;with chair alarm set Nurse Communication: Mobility status PT Visit Diagnosis: Other abnormalities of gait and mobility (R26.89);Muscle weakness (generalized) (M62.81)    Time: 0827-0901 PT Time Calculation (min) (ACUTE ONLY): 34 min   Charges:   PT Evaluation $PT Eval Low Complexity: 1 Low   PT General Charges $$ ACUTE PT VISIT: 1 Visit         [name], PT, DPT Acute Rehabilitation Office [phone]   [name] 11/21/2023, 10:14 AM

## 2023-11-21 NOTE — Anesthesia Procedure Notes (Signed)
 Procedure Name: Intubation Date/Time: 11/21/2023 10:52 AM  Performed by: Mannie Krystal LABOR, CRNAPre-anesthesia Checklist: Patient identified, Emergency Drugs available, Suction available and Patient being monitored Patient Re-evaluated:Patient Re-evaluated prior to induction Oxygen Delivery Method: Circle system utilized Preoxygenation: Pre-oxygenation with 100% oxygen Induction Type: IV induction and Rapid sequence Laryngoscope Size: Mac and 4 Grade View: Grade II Tube type: Oral Tube size: 7.5 mm Number of attempts: 1 Airway Equipment and Method: Stylet and Oral airway Placement Confirmation: ETT inserted through vocal cords under direct vision, positive ETCO2 and breath sounds checked- equal and bilateral Secured at: 22 cm Tube secured with: Tape Dental Injury: Teeth and Oropharynx as per pre-operative assessment

## 2023-11-21 NOTE — Progress Notes (Signed)
 Patient worked with PT, getting up to the chair and BSC. Ryan with PT asked if I could assist patient from [facility] to chair. At that time patient was alert answering question and VSS. I gave the patient a bath while on the [facility]. Patient stood to transfer to the chair. Speech was slurred, right upper extremity weakness was noted. Once in the chair patient had a left gaze. Dr. Jerri was notified and directly came into the room to assess patient. Patient was transferred back to bed with I and Dr. Jerri assistants. Orders given for an IV bolus and place patient in trendelenburg. Stat CTA and profusion scan ordered and completed. Dr. Jerri called wife to update and obtain consent for IR. Patient was transferred to IR. Bedside report was give to IR staff.   LKW 0930 8/4   Rian Bohr RN, Scientist, research (physical sciences), TCRN

## 2023-11-21 NOTE — Anesthesia Postprocedure Evaluation (Signed)
 Anesthesia Post Note  Patient: Allen Macdonald  Procedure(s) Performed: RADIOLOGY WITH ANESTHESIA     Patient location during evaluation: PACU Anesthesia Type: General Level of consciousness: awake and alert Pain management: pain level controlled Vital Signs Assessment: post-procedure vital signs reviewed and stable Respiratory status: spontaneous breathing, nonlabored ventilation, respiratory function stable and patient connected to nasal cannula oxygen Cardiovascular status: blood pressure returned to baseline and stable Postop Assessment: no apparent nausea or vomiting Anesthetic complications: no   No notable events documented.  Last Vitals:  Vitals:   11/21/23 1245 11/21/23 1300  BP: 124/71 (!) 141/62  Pulse: 83 82  Resp: 12 13  Temp:    SpO2: 96% 97%    Last Pain:  Vitals:   11/21/23 0800  TempSrc: Oral  PainSc: 0-No pain    LLE Motor Response: Responds to commands (11/21/23 1300) LLE Sensation: Other (Comment) (UTA) (11/21/23 1300) RLE Motor Response: No movement to painful stimulus (11/21/23 1300) RLE Sensation: Other (Comment) (UTA) (11/21/23 1300)      Debby FORBES Like

## 2023-11-21 NOTE — Progress Notes (Signed)
 OT Cancellation Note  Patient Details Name: Srihan Brutus MRN: [id number] DOB: 1950/12/11   Cancelled Treatment:    Reason Eval/Treat Not Completed: Medical issues which prohibited therapy (pt s/p IR thrombectomy, per RN not appropriate for OT today, will follow up next date for OT eval as schedule permits)  Tequita Marrs K, OTD, OTR/L SecureChat Preferred Acute Rehab [phone]   [name] 11/21/2023, 12:23 PM

## 2023-11-21 NOTE — Progress Notes (Signed)
Unable to complete exam

## 2023-11-22 ENCOUNTER — Inpatient Hospital Stay (HOSPITAL_COMMUNITY)

## 2023-11-22 ENCOUNTER — Encounter (HOSPITAL_COMMUNITY): Payer: Self-pay | Admitting: Radiology

## 2023-11-22 DIAGNOSIS — I6389 Other cerebral infarction: Secondary | ICD-10-CM

## 2023-11-22 DIAGNOSIS — I6340 Cerebral infarction due to embolism of unspecified cerebral artery: Secondary | ICD-10-CM

## 2023-11-22 DIAGNOSIS — R29719 NIHSS score 19: Secondary | ICD-10-CM

## 2023-11-22 DIAGNOSIS — D6869 Other thrombophilia: Secondary | ICD-10-CM | POA: Diagnosis not present

## 2023-11-22 DIAGNOSIS — I951 Orthostatic hypotension: Secondary | ICD-10-CM | POA: Diagnosis not present

## 2023-11-22 DIAGNOSIS — I080 Rheumatic disorders of both mitral and aortic valves: Secondary | ICD-10-CM

## 2023-11-22 DIAGNOSIS — I63512 Cerebral infarction due to unspecified occlusion or stenosis of left middle cerebral artery: Secondary | ICD-10-CM | POA: Diagnosis not present

## 2023-11-22 DIAGNOSIS — C7951 Secondary malignant neoplasm of bone: Secondary | ICD-10-CM

## 2023-11-22 LAB — ECHOCARDIOGRAM COMPLETE
Area-P 1/2: 3.85 cm2
Calc EF: 62.7 %
Height: 70 in
S' Lateral: 3.9 cm
Single Plane A2C EF: 61.2 %
Single Plane A4C EF: 64.3 %
Weight: 3686.09 [oz_av]

## 2023-11-22 LAB — BASIC METABOLIC PANEL WITH GFR
Anion gap: 12 (ref 5–15)
BUN: 15 mg/dL (ref 8–23)
CO2: 18 mmol/L — ABNORMAL LOW (ref 22–32)
Calcium: 7.5 mg/dL — ABNORMAL LOW (ref 8.9–10.3)
Chloride: 114 mmol/L — ABNORMAL HIGH (ref 98–111)
Creatinine, Ser: 1.27 mg/dL — ABNORMAL HIGH (ref 0.61–1.24)
GFR, Estimated: 59 mL/min — ABNORMAL LOW (ref >60)
Glucose, Bld: 115 mg/dL — ABNORMAL HIGH (ref 70–99)
Potassium: 3.5 mmol/L (ref 3.5–5.1)
Sodium: 144 mmol/L (ref 135–145)

## 2023-11-22 LAB — CBC
HCT: 25.4 % — ABNORMAL LOW (ref 39.0–52.0)
Hemoglobin: 7.8 g/dL — ABNORMAL LOW (ref 13.0–17.0)
MCH: 29.3 pg (ref 26.0–34.0)
MCHC: 30.7 g/dL (ref 30.0–36.0)
MCV: 95.5 fL (ref 80.0–100.0)
Platelets: 146 K/uL — ABNORMAL LOW (ref 150–400)
RBC: 2.66 MIL/uL — ABNORMAL LOW (ref 4.22–5.81)
RDW: 19.7 % — ABNORMAL HIGH (ref 11.5–15.5)
WBC: 8.5 K/uL (ref 4.0–10.5)
nRBC: 0 % (ref 0.0–0.2)

## 2023-11-22 LAB — GLUCOSE, CAPILLARY
Glucose-Capillary: 113 mg/dL — ABNORMAL HIGH (ref 70–99)
Glucose-Capillary: 103 mg/dL — ABNORMAL HIGH (ref 70–99)
Glucose-Capillary: 90 mg/dL (ref 70–99)
Glucose-Capillary: 91 mg/dL (ref 70–99)

## 2023-11-22 MED ORDER — ASPIRIN 300 MG RE SUPP
300.0000 mg | Freq: Every day | RECTAL | Status: DC
  Administered 2023-11-22: 300 mg via RECTAL
  Filled 2023-11-22: qty 1

## 2023-11-22 MED ORDER — POTASSIUM CHLORIDE 10 MEQ/100ML IV SOLN
10.0000 meq | INTRAVENOUS | Status: AC
  Administered 2023-11-22 (×2): 10 meq via INTRAVENOUS
  Filled 2023-11-22 (×2): qty 100

## 2023-11-22 MED ORDER — ENOXAPARIN SODIUM 60 MG/0.6ML IJ SOSY
50.0000 mg | PREFILLED_SYRINGE | Freq: Every day | INTRAMUSCULAR | Status: DC
  Administered 2023-11-22 – 2023-11-24 (×3): 50 mg via SUBCUTANEOUS
  Filled 2023-11-22 (×3): qty 0.6

## 2023-11-22 NOTE — Progress Notes (Signed)
 He is better today. Alert Normal gaze and visual fields. Still aphasic but able to state name and follow directions. Dysarthric Right face weak Right arm is antigravity.  Elevates right leg with drift. Sensation mildly reduced.    I reviewed his MRI/A Multiples small scattered infarcts.  Left MCA and proximal branches are patent.  Femoral puncture looks good. Good pedal pulse.  A: Left MCA stroke due to hypercoagulable state from malignancy, left mca dissection. Stroke improving.  Good prognosis from the current stroke with rehab but underlying malignancy and at very high risk for future thromboembolic complications.    P: Start aspirin  suppository, convert to PO when tube feeds available. DC Cangrelor .

## 2023-11-22 NOTE — Progress Notes (Addendum)
 STROKE TEAM PROGRESS NOTE    SIGNIFICANT HOSPITAL EVENTS 8/3 presented from home with right-sided weakness, left gaze and aphasia.  He underwent thrombectomy.  Had stent retriever assisted mechanical thrombectomy of left carotid T and M1 occlusions with TICI 2C  INTERIM HISTORY/SUBJECTIVE Wife is at bedside.  No new neurological events overnight.  Still with moderate aphasia however is able to repeat and follow commands, still with right facial droop right hemianopia and right arm drift. Cangrelor  to be stopped and started on aspirin  suppository. Will need core Trak tube tomorrow Will likely transfer out of the ICU today  CBC    Component Value Date/Time   WBC 8.5 11/22/2023 1113   RBC 2.66 (L) 11/22/2023 1113   HGB 7.8 (L) 11/22/2023 1113   HGB 9.0 (L) 07/15/2023 1309   HGB 13.5 04/22/2017 1023   HCT 25.4 (L) 11/22/2023 1113   HCT 39.2 04/22/2017 1023   PLT 146 (L) 11/22/2023 1113   PLT 170 07/15/2023 1309   PLT 164 04/22/2017 1023   MCV 95.5 11/22/2023 1113   MCV 95 04/22/2017 1023   MCH 29.3 11/22/2023 1113   MCHC 30.7 11/22/2023 1113   RDW 19.7 (H) 11/22/2023 1113   RDW 14.0 04/22/2017 1023   LYMPHSABS 0.8 11/20/2023 0959   MONOABS 0.5 11/20/2023 0959   EOSABS 0.2 11/20/2023 0959   BASOSABS 0.0 11/20/2023 0959    BMET    Component Value Date/Time   NA 144 11/22/2023 1113   NA 139 07/05/2019 1045   K 3.5 11/22/2023 1113   CL 114 (H) 11/22/2023 1113   CO2 18 (L) 11/22/2023 1113   GLUCOSE 115 (H) 11/22/2023 1113   BUN 15 11/22/2023 1113   BUN 11 07/05/2019 1045   CREATININE 1.27 (H) 11/22/2023 1113   CALCIUM  7.5 (L) 11/22/2023 1113   GFRNONAA 59 (L) 11/22/2023 1113    IMAGING past 24 hours VAS US  LOWER EXTREMITY VENOUS (DVT) Result Date: 11/22/2023  Lower Venous DVT Study Patient Name:  Allen Macdonald  Date of Exam:   11/21/2023 Medical Rec #: [id number]        Accession #:    [id number] Date of Birth: 15-Jan-1951        Patient Gender: M Patient Age:   73 years  Exam Location:  [facility] Procedure:      VAS US  LOWER EXTREMITY VENOUS (DVT) Referring Phys: ARY Arnitra Sokoloski --------------------------------------------------------------------------------  Indications: Stroke.  Risk Factors: Cancer (bladder). Anticoagulation: Eliquis . Limitations: Bandages, poor ultrasound/tissue interface and patient immobility. Comparison Study: Previous RLEV on 09/07/2023 was positive for DVT (CFV, FV, PFV,                   PopV, PTV) Previous BLEV 06/27/2023 was negative for DVT,                   however exam was limited. Performing Technologist: Ezzie Potters RVT, RDMS  Examination Guidelines: A complete evaluation includes B-mode imaging, spectral Doppler, color Doppler, and power Doppler as needed of all accessible portions of each vessel. Bilateral testing is considered an integral part of a complete examination. Limited examinations for reoccurring indications may be performed as noted. The reflux portion of the exam is performed with the patient in reverse Trendelenburg.  +---------+---------------+---------+-----------+----------+-------------------+ RIGHT    CompressibilityPhasicitySpontaneityPropertiesThrombus Aging      +---------+---------------+---------+-----------+----------+-------------------+ CFV  Unable to visualize +---------+---------------+---------+-----------+----------+-------------------+ SFJ                                                   Unable to visualize +---------+---------------+---------+-----------+----------+-------------------+ FV Prox  Full           Yes      Yes                                      +---------+---------------+---------+-----------+----------+-------------------+ FV Mid   Full           Yes      Yes                                      +---------+---------------+---------+-----------+----------+-------------------+ FV DistalFull           Yes      Yes                                       +---------+---------------+---------+-----------+----------+-------------------+ PFV      Full                                                             +---------+---------------+---------+-----------+----------+-------------------+ POP      None           No       No                   Age Indeterminate   +---------+---------------+---------+-----------+----------+-------------------+ PTV      None           No       No                   Age Indeterminate   +---------+---------------+---------+-----------+----------+-------------------+ PERO     Full                                                             +---------+---------------+---------+-----------+----------+-------------------+   Right Technical Findings: Not visualized segments include CFV, SFJ - pressure dressing from thrombectomy. Popliteal vein not well visualized due to immobility  +---------+---------------+---------+-----------+----------+-----------------+ LEFT     CompressibilityPhasicitySpontaneityPropertiesThrombus Aging    +---------+---------------+---------+-----------+----------+-----------------+ CFV      None           No       No                   Age Indeterminate +---------+---------------+---------+-----------+----------+-----------------+ SFJ      Partial                                                        +---------+---------------+---------+-----------+----------+-----------------+  FV Prox  None           No       Yes                  Age Indeterminate +---------+---------------+---------+-----------+----------+-----------------+ FV Mid   None           No       No                   Age Indeterminate +---------+---------------+---------+-----------+----------+-----------------+ FV DistalNone           No       No                   Age Indeterminate  +---------+---------------+---------+-----------+----------+-----------------+ PFV      Partial        No       Yes                  Age Indeterminate +---------+---------------+---------+-----------+----------+-----------------+ POP      None           No       No                   Age Indeterminate +---------+---------------+---------+-----------+----------+-----------------+ PTV      Full                                                           +---------+---------------+---------+-----------+----------+-----------------+ PERO     Full                                                           +---------+---------------+---------+-----------+----------+-----------------+ Gastroc  None           No       No                   Age Indeterminate +---------+---------------+---------+-----------+----------+-----------------+ EIV      Full           Yes      Yes                                    +---------+---------------+---------+-----------+----------+-----------------+     Summary: RIGHT: - Findings consistent with age indeterminate deep vein thrombosis involving the right posterior tibial veins, and right popliteal vein.   LEFT: - Findings consistent with age indeterminate deep vein thrombosis involving the left common femoral vein, SF junction, left femoral vein, left proximal profunda vein, and left popliteal vein. Findings consistent with age indeterminate intramuscular thrombus involving the left gastrocnemius veins.  *See table(s) above for measurements and observations. Electronically signed by Norman Serve on 11/22/2023 at 9:20:21 AM.    Final    MR ANGIO HEAD WO CONTRAST Result Date: 11/22/2023 EXAM: MR Angiography Head without intravenous Contrast. 11/22/2023 05:40:30 AM TECHNIQUE: Magnetic resonance angiography images of the head without intravenous contrast. Multiplanar 2D and 3D reformatted images are provided for review. COMPARISON: CT angio head and neck  11/21/2023. Cerebral arteriogram 11/21/2023. CLINICAL HISTORY: Stroke, follow up. FINDINGS: ANTERIOR CIRCULATION:  A left A1 segment is dominant. The anterior communicating artery is patent. The left MCA branch vessels are within normal limits on this study. No aneurysm. POSTERIOR CIRCULATION: A left vertebral artery is a dominant vessel. No aneurysm. IMPRESSION: 1. No significant stenosis of the intracranial vasculature. 2. No aneurysm. Electronically signed by: Lonni Necessary MD 11/22/2023 06:02 AM EDT RP Workstation: [id number]   MR BRAIN WO CONTRAST Result Date: 11/22/2023 EXAM: MRI BRAIN WITHOUT CONTRAST 11/22/2023 05:40:00 AM TECHNIQUE: Multiplanar multisequence MRI of the head/brain was performed without the administration of intravenous contrast. COMPARISON: None available. CLINICAL HISTORY: Stroke, follow up. Left MCA dissection. FINDINGS: BRAIN AND VENTRICLES: Scattered foci of restricted diffusion are present throughout the left MCA territory consistent with cortical and subcortical infarcts. Partial infarcts of the left caudate head and lentiform nuclei are present. T2 and FLAIR signal changes are consistent with the subacute timeframe of the infarcts. Susceptibility weighted images demonstrate signal change within the large cortical lesions and in the left caudate head lesion consistent with petechial hemorrhage. Cortical thickening is present without other significant mass effect. Periventricular and subcortical T2 hyperintensities are otherwise mildly advanced for age. The sella is unremarkable. Normal flow voids. ORBITS: Bilateral lens replacements are noted. The globes and orbits are otherwise within normal limits. SINUSES AND MASTOIDS: Minimal fluid is present in the right maxillary sinus. Small bilateral mastoid effusions are present. BONES AND SOFT TISSUES: Normal marrow signal. No acute soft tissue abnormality. IMPRESSION: 1. Scattered foci of restricted diffusion throughout the left MCA  territory consistent with cortical and subcortical infarcts, including partial infarcts of the left caudate head and lentiform nuclei. Findings are consistent with the subacute timeframe. 2. Petechial hemorrhage noted within the largest cortical lesions and the left caudate head. 3. Cortical thickening without other significant mass effect. 4. Mildly advanced periventricular and subcortical T2 hyperintensities for age. 5. Minimal fluid in the right maxillary sinus and small bilateral mastoid effusions. Electronically signed by: Lonni Necessary MD 11/22/2023 05:54 AM EDT RP Workstation: [id number]   CT ANGIO HEAD NECK W WO CM Result Date: 11/21/2023 EXAM: CTA HEAD AND NECK WITH AND WITHOUT 11/21/2023 01:49:04 PM TECHNIQUE: CTA of the head and neck was performed with and without the administration of intravenous contrast. Multiplanar 2D and/or 3D reformatted images are provided for review. Automated exposure control, iterative reconstruction, and/or weight based adjustment of the mA/kV was utilized to reduce the radiation dose to as low as reasonably achievable. Stenosis of the internal carotid arteries measured using NASCET criteria. COMPARISON: CT angiogram of the head and neck and CT perfusion of the head dated 11/20/2000. CLINICAL HISTORY: Stroke/TIA, determine embolic source. FINDINGS: CTA NECK: AORTIC ARCH AND ARCH VESSELS: Minimal calcific atheromatous disease within the aortic arch. No dissection or arterial injury. No significant stenosis of the brachiocephalic or subclavian arteries. CERVICAL CAROTID ARTERIES: Mild calcific/noncalcific plaque within the origin of the right internal carotid artery, with no appreciable stenosis. Mild calcific plaque within the origin of the left internal carotid artery, also with no significant stenosis. No dissection or arterial injury. CERVICAL VERTEBRAL ARTERIES: The left vertebral artery is dominant. Both vertebral arteries are normal in caliber throughout their  respective courses. No dissection, arterial injury, or significant stenosis. LUNGS AND MEDIASTINUM: Unremarkable. SOFT TISSUES: No acute abnormality. BONES: No acute abnormality. CTA HEAD: ANTERIOR CIRCULATION: Mild generalized cerebral volume loss and mild periventricular white matter disease. Calcification within the carotid siphons bilaterally and mild diffuse stenosis of the clinoid and cavernous segments of the right internal carotid artery. The  endoluminal filling defect previously seen at the left ICA terminus is no longer present. The M1 segment of the left middle cerebral artery has recanalized in the interim and demonstrates moderate stenosis distally. It is also diffusely irregular. The M2 branches are patent. The right A1 segment is diminutive and diffusely irregular. No aneurysm. POSTERIOR CIRCULATION: No significant stenosis of the posterior cerebral arteries. No significant stenosis of the basilar artery. No significant stenosis of the vertebral arteries. No aneurysm. OTHER: No dural venous sinus thrombosis on this non-dedicated study. IMPRESSION: 1. No large vessel occlusion or aneurysm in the head or neck. There has been interval recanalization of the M1 segment of the left middle cerebral artery. 2. Moderate stenosis distally in the recanalized M1 segment of the left middle cerebral artery, which is also diffusely irregular. Electronically signed by: evalene coho 11/21/2023 02:39 PM EDT RP Workstation: [id number]    Vitals:   11/22/23 0800 11/22/23 0900 11/22/23 0902 11/22/23 1200  BP: 137/66 (!) 123/57 (!) 123/57   Pulse: 75 69 70   Resp: 20 15 18    Temp: (!) 100.4 F (38 C)   98.5 F (36.9 C)  TempSrc: Axillary   Axillary  SpO2: 98% 96% 95%   Weight:      Height:         PHYSICAL EXAM General: Critically ill elderly male in no apparent distress  psych:  Mood and affect appropriate for situation CV: Regular rate and rhythm on monitor Respiratory:  Regular, unlabored  respirations on room air GI: Abdomen soft and nontender   NEURO:  Mental Status: Awake and alert.  Still with moderate aphasia however is able to repeat and follow commands. speech is dysarthric and garbled  Cranial Nerves:  II: PERRL. Visual fields with right hemianopia III, IV, VI: EOM with left gaze preference, can get to midline but cannot cross V: Sensation is intact to light touch and symmetrical to face.  VII: Right facial droop VIII: hearing intact to voice. IX, X: Palate elevates symmetrically. Phonation is normal.  KP:Dynloizm shrug 5/5. XII: tongue is midline without fasciculations. Motor: Right arm flaccid with minimal movement, right leg with drift, left arm and left leg antigravity and spontaneous movement Tone: is normal and bulk is normal Sensation-decreased on right arm, withdraws on right leg.  Has right neglect Coordination: FTN intact bilaterally, HKS: no ataxia in BLE.No drift.  Gait- deferred  Most Recent NIH  1a Level of Conscious.:  1b LOC Questions: 2 1c LOC Commands: 1 2 Best Gaze: 1 3 Visual: 1 4 Facial Palsy: 1 5a Motor Arm - left: 0 5b Motor Arm - Right: 4 6a Motor Leg - Left:  6b Motor Leg - Right: 2 7 Limb Ataxia: 0 8 Sensory: 1 9 Best Language: 2 10 Dysarthria: 2 11 Extinct. and Inatten.: 2 TOTAL: 17   ASSESSMENT/PLAN  Allen Macdonald is a 73 y.o. male with history of HTN, DVT/PE on eliquis , bladder cancer s/p nephrostomy  admitted for right-sided weakness, left gaze and aphasia .  NIH on Admission 24  Stroke:  left MCA scattered infarcts with left tICA and M1 occlusion s/p IR with TICI2c Etiology: Likely due to hypercoagulable state in the setting of advanced cancer Code Stroke CT head No acute abnormality.   CTA head & neck 8/2 Positive for Left ICA terminus Occlusion with decreased L MCA flow S/p IR with left tICA and M1 occlusion with TICI2c MRI 8/3 Multiple scattered small foci of acute ischemia within the left MCA territory,  greatest in the frontal lobe. Punctate foci of acute ischemia in the right occipital lobe and right frontal white matter. 2D Echo ordered US  LE positive for age indeterminate DVTs: RT - PopV and PTV LT - CFV, PFV, FV, PopV, Gastrocs  TCD bubble study no DVT LDL 112 HgbA1c ordered VTE prophylaxis - SCDs Eliquis  prior to admission, now on ASA suppository. Will resume eliquis  in a couple of days if passed swallow by then.  Therapy recommendations:  CIR Disposition: Pending  Recurrent stroke: extension of left MCA scattered infarcts with left M1 re-occlusion s/p IR with TICI 2b due to dissection and hypotension. Acute neuro change with PT and sitting up, BP was low, received head of bed flat and IVF, BP improved but still has worsening left MCA syndrome Repeat CT head 8/3 - no acute process Repeat CTA head and neck with Left M1 branch occlusion at its origin  CTP core infarct 4cc, mismatch 43 cc Repeat IR with TICI2b Post IR repeat CTA showed interval recanalization of the M1 segment of the left MCA. Moderate stenosis distally in the recanalized M1 segment of the left MCA, which is also diffusely irregular. MRI brain repeat showed scattered left MCA infarcts, extended from prior MRA left MCA patent  Cangrelor  stopped 8/5 - on ASA PR. Will resume eliquis  in a couple of days if passed swallow by then  History of DVT/PE Hx of multiple b/l DVT and PE Had IVC filter placed ~ 2014 Likely related to hypercoagulable state from advanced malignancy Home Meds: Eliquis  2.5 bid US  LE positive for age indeterminate DVTs: RT - PopV and PTV LT - CFV, PFV, FV, PopV, Gastrocs  Eliquis  on hold due to acute stroke, may resume in a couple of days if passed swallow  Hx of hypertension Orthostatic hypotension Home meds: Amlodipine  5 mg, carvedilol  12.5 mg Orthostasis 8/4 am with low BP on sitting, improved with supine and IVF Off cleviprex  and Neo BP stable now Avoid low BP Long term BP goal  normotensive  Hyperlipidemia Home meds: None LDL 112, goal < 70 Add atorvastatin  40 once po access Continue statin at discharge  Bladder cancer s/p bilateral nephrostomy tubes AKI UTI Fever Poorly differentiated bladder cancer, now stage IV, metastatic to the bone by MRI of the spine on 11/08/2023.  Cr 1.3 -1.29-1.44 -1.27 monitor  UA WBC > 50 Urine culture ordered Rocephin  started Fever Tmax 101.5  Anemia Hgb 9.0-8.8-8.2 -7.8 will monitor  Likely related to chronic disease  Dysphagia Patient has post-stroke dysphagia Speech consulted Now NPO If needed, cortrak on Wednesday On IVF now  Other Stroke Risk Factors Obesity, Body mass index is 33.06 kg/m., BMI >/= 30 associated with increased stroke risk, recommend weight loss, diet and exercise as appropriate   Other acute medical issues Hx of prostate cancer, on ADT H/o laryngeal cancer in 2021. S/p radiation therapy.   Karna Geralds DNP, ACNPC-AG  [facility]  ATTENDING NOTE: I reviewed above note and agree with the assessment and plan. Pt was seen and examined.   Wife at the bedside. Pt lying in bed, more awake alert and interactive. Able to repeat 3 word sentence but had difficulty with 5 word sentence. Not able to name, still has significant expressive aphasia but following all simple commands. LUE and LLE 5/5. RUE 3-/5 and RLE 4/5. Left FTN intact. Neuro exam has improved from yesterday afternoon. PT and OT recommend CIR. BP stable now. Still NPO. Pending further speech eval. On IVF, off cangrelor  and will  put on ASA PR, will consider resume eliquis  but on higher dose 5mg  bid in two days if passed swallow by then.   For detailed assessment and plan, please refer to above as I have made changes wherever appropriate.   Ary Cummins, MD PhD Stroke Neurology 11/22/2023 2:53 PM  This patient is critically ill due to stroke and recurrent stroke with LVO, status post IR x 2, orthostatic hypotension, DVT, bladder  cancer with metastasis and at significant risk of neurological worsening, death form recurrent stroke, hemorrhagic termination, syncope, PE, seizure. This patient's care requires constant monitoring of vital signs, hemodynamics, respiratory and cardiac monitoring, review of multiple databases, neurological assessment, discussion with family, other specialists and medical decision making of high complexity. I spent 35 minutes of neurocritical care time in the care of this patient. I discussed with Dr. Lester.   To contact Stroke Continuity provider, please refer to [email or web address]. After hours, contact General Neurology

## 2023-11-22 NOTE — Progress Notes (Signed)
 PT Cancellation Note  Patient Details Name: Allen Macdonald MRN: [id number] DOB: February 01, 1951   Cancelled Treatment:    Reason Eval/Treat Not Completed: Medical issues which prohibited therapy. Pt with neuro change yesterday and found to have L MCA dissection with multiple infarcts, along with BLE DVT. PT will hold currently and, follow up tomorrow.   [name] 11/22/2023, 8:41 AM

## 2023-11-22 NOTE — Progress Notes (Signed)
 VASCULAR LAB    TCD bubble study has been performed.  See CV proc for preliminary results.   Donnisha Besecker, RVT 11/22/2023, 1:53 PM

## 2023-11-22 NOTE — Progress Notes (Signed)
 OT Cancellation Note  Patient Details Name: Allen Macdonald MRN: [id number] DOB: 1950/11/25   Cancelled Treatment:    Reason Eval/Treat Not Completed: Medical issues which prohibited therapy (Pt with neuro change yesterday and found to have L MCA dissection with multiple infarcts, along with BLE DVT. OT will hold currently and, follow up tomorrow.)  [name] K, OTD, OTR/L [employer] [facility] [phone]   [name] 11/22/2023, 8:48 AM

## 2023-11-23 ENCOUNTER — Encounter (HOSPITAL_COMMUNITY): Payer: Self-pay | Admitting: Neurology

## 2023-11-23 DIAGNOSIS — I951 Orthostatic hypotension: Secondary | ICD-10-CM | POA: Diagnosis not present

## 2023-11-23 DIAGNOSIS — R29719 NIHSS score 19: Secondary | ICD-10-CM | POA: Diagnosis not present

## 2023-11-23 DIAGNOSIS — D6869 Other thrombophilia: Secondary | ICD-10-CM | POA: Diagnosis not present

## 2023-11-23 DIAGNOSIS — I63512 Cerebral infarction due to unspecified occlusion or stenosis of left middle cerebral artery: Secondary | ICD-10-CM | POA: Diagnosis not present

## 2023-11-23 LAB — BASIC METABOLIC PANEL WITH GFR
Anion gap: 8 (ref 5–15)
BUN: 18 mg/dL (ref 8–23)
CO2: 19 mmol/L — ABNORMAL LOW (ref 22–32)
Calcium: 7.4 mg/dL — ABNORMAL LOW (ref 8.9–10.3)
Chloride: 115 mmol/L — ABNORMAL HIGH (ref 98–111)
Creatinine, Ser: 1.22 mg/dL (ref 0.61–1.24)
GFR, Estimated: >60 mL/min (ref >60)
Glucose, Bld: 103 mg/dL — ABNORMAL HIGH (ref 70–99)
Potassium: 3.3 mmol/L — ABNORMAL LOW (ref 3.5–5.1)
Sodium: 142 mmol/L (ref 135–145)

## 2023-11-23 LAB — CBC
HCT: 21.3 % — ABNORMAL LOW (ref 39.0–52.0)
HCT: 29.3 % — ABNORMAL LOW (ref 39.0–52.0)
Hemoglobin: 6.7 g/dL — CL (ref 13.0–17.0)
Hemoglobin: 9.5 g/dL — ABNORMAL LOW (ref 13.0–17.0)
MCH: 29.5 pg (ref 26.0–34.0)
MCH: 29.3 pg (ref 26.0–34.0)
MCHC: 31.5 g/dL (ref 30.0–36.0)
MCHC: 32.4 g/dL (ref 30.0–36.0)
MCV: 93.8 fL (ref 80.0–100.0)
MCV: 90.4 fL (ref 80.0–100.0)
Platelets: 135 K/uL — ABNORMAL LOW (ref 150–400)
Platelets: 145 K/uL — ABNORMAL LOW (ref 150–400)
RBC: 2.27 MIL/uL — ABNORMAL LOW (ref 4.22–5.81)
RBC: 3.24 MIL/uL — ABNORMAL LOW (ref 4.22–5.81)
RDW: 19.9 % — ABNORMAL HIGH (ref 11.5–15.5)
RDW: 18.7 % — ABNORMAL HIGH (ref 11.5–15.5)
WBC: 6.9 K/uL (ref 4.0–10.5)
WBC: 7.1 K/uL (ref 4.0–10.5)
nRBC: 0 % (ref 0.0–0.2)
nRBC: 0 % (ref 0.0–0.2)

## 2023-11-23 LAB — GLUCOSE, CAPILLARY
Glucose-Capillary: 97 mg/dL (ref 70–99)
Glucose-Capillary: 109 mg/dL — ABNORMAL HIGH (ref 70–99)
Glucose-Capillary: 111 mg/dL — ABNORMAL HIGH (ref 70–99)
Glucose-Capillary: 124 mg/dL — ABNORMAL HIGH (ref 70–99)

## 2023-11-23 LAB — HEMOGLOBIN A1C
Hgb A1c MFr Bld: 5.7 % — ABNORMAL HIGH (ref 4.8–5.6)
Mean Plasma Glucose: 117 mg/dL

## 2023-11-23 LAB — ABO/RH: ABO/RH(D): A POS

## 2023-11-23 LAB — PHOSPHORUS: Phosphorus: 2.1 mg/dL — ABNORMAL LOW (ref 2.5–4.6)

## 2023-11-23 LAB — MAGNESIUM: Magnesium: 2 mg/dL (ref 1.7–2.4)

## 2023-11-23 LAB — PREPARE RBC (CROSSMATCH)

## 2023-11-23 MED ORDER — ORAL CARE MOUTH RINSE
15.0000 mL | OROMUCOSAL | Status: DC | PRN
Start: 2023-11-23 — End: 2023-11-24
  Administered 2023-11-23: 15 mL via OROMUCOSAL

## 2023-11-23 MED ORDER — THIAMINE MONONITRATE 100 MG PO TABS
100.0000 mg | ORAL_TABLET | Freq: Every day | ORAL | Status: DC
  Administered 2023-11-23 – 2023-11-25 (×3): 100 mg via ORAL
  Filled 2023-11-23 (×3): qty 1

## 2023-11-23 MED ORDER — ADULT MULTIVITAMIN W/MINERALS CH
1.0000 | ORAL_TABLET | Freq: Every day | ORAL | Status: DC
  Administered 2023-11-23 – 2023-11-25 (×3): 1 via ORAL
  Filled 2023-11-23 (×3): qty 1

## 2023-11-23 MED ORDER — ORAL CARE MOUTH RINSE
15.0000 mL | OROMUCOSAL | Status: DC
  Administered 2023-11-23 (×3): 15 mL via OROMUCOSAL

## 2023-11-23 MED ORDER — ASPIRIN 81 MG PO TBEC
81.0000 mg | DELAYED_RELEASE_TABLET | Freq: Every day | ORAL | Status: DC
  Administered 2023-11-23 – 2023-11-24 (×2): 81 mg via ORAL
  Filled 2023-11-23 (×2): qty 1

## 2023-11-23 MED ORDER — SODIUM CHLORIDE 0.9% IV SOLUTION: Freq: Once | INTRAVENOUS | Status: AC

## 2023-11-23 MED ORDER — POTASSIUM & SODIUM PHOSPHATES 280-160-250 MG PO PACK
2.0000 | PACK | ORAL | Status: AC
  Administered 2023-11-23 (×3): 2 via ORAL
  Filled 2023-11-23 (×3): qty 2

## 2023-11-23 NOTE — Progress Notes (Signed)
 Physical Therapy Re-evaluation Patient Details Name: Allen Macdonald MRN: [id number] DOB: 02-Jul-1950 Today's Date: 11/23/2023   History of Present Illness 73 y.o. male presents to [facility] hospital on 11/20/2023 with R weakness, L gaze and expressive aphasia. CTA with L ICA occlusion, pt underwent thrombectomy on 8/3. Pt with neuro change on 8/4, found to have L M1 occlusion and underwent subsequent thrombectomy and found to have L MCA dissection. PMH includes HTN, DVT/PE, bladder CA s/p nephrostomy..    PT Comments  PT performs re-evaluation after neuro change on 8/4 and findings of new scattered infarcts. Pt demonstrates increased weakness of RLE as well as impaired coordination of RLE and BUE. Pt requires increased time and UE support to successfully transfer from bed to recliner. PT also notes more significant expressive communication deficits as compared to initial evaluation. Patient will benefit from intensive inpatient follow-up therapy, >3 hours/day.    If plan is discharge home, recommend the following: A little help with walking and/or transfers;A lot of help with bathing/dressing/bathroom;Assistance with cooking/housework;Direct supervision/assist for medications management;Direct supervision/assist for financial management;Assist for transportation;Help with stairs or ramp for entrance;Supervision due to cognitive status   Can travel by private vehicle        Equipment Recommendations  Rolling walker (2 wheels);BSC/3in1    Recommendations for Other Services       Precautions / Restrictions Precautions Precautions: Fall Recall of Precautions/Restrictions: Impaired Restrictions Weight Bearing Restrictions Per Provider Order: No     Mobility  Bed Mobility Overal bed mobility: Needs Assistance Bed Mobility: Supine to Sit     Supine to sit: Contact guard, HOB elevated          Transfers Overall transfer level: Needs assistance Equipment used: 2 person hand held  assist Transfers: Sit to/from Stand, Bed to chair/wheelchair/BSC Sit to Stand: Min assist   Step pivot transfers: Min assist, +2 safety/equipment       General transfer comment: pt with weakness of RLE noted, taking short steps with reduced foot clearance, reliant on UE support for stability    Ambulation/Gait                   Stairs             Wheelchair Mobility     Tilt Bed    Modified Rankin (Stroke Patients Only) Modified Rankin (Stroke Patients Only) Pre-Morbid Rankin Score: No symptoms Modified Rankin: Moderately severe disability     Balance Overall balance assessment: Needs assistance Sitting-balance support: No upper extremity supported, Feet supported Sitting balance-Leahy Scale: Good     Standing balance support: Single extremity supported, Reliant on assistive device for balance Standing balance-Leahy Scale: Poor                              Communication Communication Communication: Impaired Factors Affecting Communication: Difficulty expressing self  Cognition Arousal: Alert Behavior During Therapy: Flat affect   PT - Cognitive impairments: Awareness, Attention, Problem solving                         Following commands: Impaired Following commands impaired: Follows multi-step commands inconsistently    Cueing Cueing Techniques: Verbal cues, Visual cues, Gestural cues  Exercises      General Comments General comments (skin integrity, edema, etc.): VSS on RA, BP 137/67 pre-mobility and 136/68 post-mobility      Pertinent Vitals/Pain Pain Assessment Pain Assessment: No/denies pain  Home Living     Available Help at Discharge: Family;Available 24 hours/day Type of Home: House                  Prior Function            PT Goals (current goals can now be found in the care plan section) Acute Rehab PT Goals Patient Stated Goal: to return to independence PT Goal Formulation: With  patient Time For Goal Achievement: 12/07/23 Potential to Achieve Goals: Fair Progress towards PT goals: Goals downgraded-see care plan    Frequency    Min 3X/week      PT Plan      Co-evaluation PT/OT/SLP Co-Evaluation/Treatment: Yes Reason for Co-Treatment: Complexity of the patient's impairments (multi-system involvement);Necessary to address cognition/behavior during functional activity;For patient/therapist safety;To address functional/ADL transfers PT goals addressed during session: Mobility/safety with mobility;Balance;Strengthening/ROM        AM-PAC PT 6 Clicks Mobility   Outcome Measure  Help needed turning from your back to your side while in a flat bed without using bedrails?: A Little Help needed moving from lying on your back to sitting on the side of a flat bed without using bedrails?: A Little Help needed moving to and from a bed to a chair (including a wheelchair)?: A Little Help needed standing up from a chair using your arms (e.g., wheelchair or bedside chair)?: A Little Help needed to walk in hospital room?: A Lot Help needed climbing 3-5 steps with a railing? : Total 6 Click Score: 15    End of Session Equipment Utilized During Treatment: Gait belt Activity Tolerance: Patient tolerated treatment well Patient left: in chair;with call bell/phone within reach;with chair alarm set;with family/visitor present Nurse Communication: Mobility status PT Visit Diagnosis: Other abnormalities of gait and mobility (R26.89);Muscle weakness (generalized) (M62.81)     Time: 9078-9054 PT Time Calculation (min) (ACUTE ONLY): 24 min  Charges:      PT General Charges $$ ACUTE PT VISIT: 1 Visit                     [name], PT, DPT Acute Rehabilitation Office [phone]    [name] 11/23/2023, 12:04 PM

## 2023-11-23 NOTE — Progress Notes (Signed)
 Inpatient Rehab Coordinator Note:  I spoke with patient's spouse, [name], over the phone to discuss CIR recommendations and goals/expectations of CIR stay.  We reviewed 3 hrs/day of therapy, physician follow up, and average length of stay 2 weeks (dependent upon progress) with goals of likely supervision.  OT eval is pending and we discussed goals and ELOS may vary based on their assessment.  Dr. Jerri feels likely ready for CIR Friday, so I reviewed need for prior auth with [facility] and will start that request likely tomorrow.  Reche Lowers, PT, DPT Admissions Coordinator [phone] 11/23/23  12:40 PM

## 2023-11-23 NOTE — Evaluation (Signed)
 Clinical/Bedside Swallow Evaluation Patient Details  Name: Allen Macdonald MRN: [id number] Date of Birth: January 10, 1951  Today's Date: 11/23/2023 Time: SLP Start Time (ACUTE ONLY): 0950 SLP Stop Time (ACUTE ONLY): 1007 SLP Time Calculation (min) (ACUTE ONLY): 17 min  Past Medical History:  Past Medical History:  Diagnosis Date   Abnormal stress test minimal ischemia involving apex 05/01/2019   Bladder cancer (HCC)    Dilated cardiomyopathy (HCC)    Essential hypertension 12/18/2015   History of DVT (deep vein thrombosis) 04/22/2017   History of pulmonary embolism 12/18/2015   Laryngeal cancer (HCC) squamous cell 02/25/2020   PC (prostate cancer) (HCC) 08/28/2019   Past Surgical History:  Past Surgical History:  Procedure Laterality Date   CATARACT EXTRACTION Bilateral    INSERTION PROSTATE RADIATION SEED  2003   IR CT HEAD LTD  11/20/2023   IR PERCUTANEOUS ART THROMBECTOMY/INFUSION INTRACRANIAL INC DIAG ANGIO  11/20/2023   IR PERCUTANEOUS ART THROMBECTOMY/INFUSION INTRACRANIAL INC DIAG ANGIO  11/21/2023   IR US  GUIDE VASC ACCESS RIGHT  11/20/2023   IR US  GUIDE VASC ACCESS RIGHT  11/21/2023   PROSTATE SURGERY     RADIOLOGY WITH ANESTHESIA N/A 11/20/2023   Procedure: RADIOLOGY WITH ANESTHESIA;  Surgeon: Radiologist, Medication, MD;  Location: [facility] OR;  Service: Radiology;  Laterality: N/A;   RADIOLOGY WITH ANESTHESIA N/A 11/21/2023   Procedure: RADIOLOGY WITH ANESTHESIA;  Surgeon: Radiologist, Medication, MD;  Location: [facility] OR;  Service: Radiology;  Laterality: N/A;   TONSILLECTOMY     Venacava Filter     WISDOM TOOTH EXTRACTION     HPI:  73 y.o. male presents to [facility] hospital on 11/20/2023 with R weakness, L gaze and expressive aphasia. CTA with L ICA occlusion, pt underwent thrombectomy on 8/3. Pt with neuro change on 8/4, found to have L M1 occlusion and underwent subsequent thrombectomy. PMH includes laryngeal cancer 2021 with 30 Rx treatments, HTN, DVT/PE, stage IV bladder CA s/p nephrostomy.     Assessment / Plan / Recommendation  Clinical Impression  Mr. Dillard presents with a mild oral dysphagia specific to decreased sensation right side CN V, asymmetry lower face right side CN VII with occasional pocketing and spillage on right.  Pharyngeal function appears to be WNL with no overt signs or symptoms of aspiration.  Recommend resuming a regular diet and thin liquids. Give meds whole with water.  Discussed with pt, wife, RN and dietitian.  SLP will follow briefly to address mild oral deficits. SLP Visit Diagnosis: Dysphagia, oral phase (R13.11)    Aspiration Risk  No limitations    Diet Recommendation   Thin;Age appropriate regular  Medication Administration: Whole meds with liquid    Other  Recommendations Oral Care Recommendations: Oral care BID     Assistance Recommended at Discharge  frequent  Functional Status Assessment Patient has had a recent decline in their functional status and demonstrates the ability to make significant improvements in function in a reasonable and predictable amount of time.  Frequency and Duration min 2x/week  2 weeks       Prognosis Prognosis for improved oropharyngeal function: Good      Swallow Study   General Date of Onset: 11/23/23 HPI: 73 y.o. male presents to [facility] hospital on 11/20/2023 with R weakness, L gaze and expressive aphasia. CTA with L ICA occlusion, pt underwent thrombectomy on 8/3. Pt with neuro change on 8/4, found to have L M1 occlusion and underwent subsequent thrombectomy. PMH includes laryngeal cancer 2021 with 30 Rx treatments, HTN, DVT/PE, stage  IV bladder CA s/p nephrostomy. Type of Study: Bedside Swallow Evaluation Previous Swallow Assessment: no Diet Prior to this Study: NPO Temperature Spikes Noted: No Respiratory Status: Room air History of Recent Intubation: Yes Behavior/Cognition: Alert;Cooperative Oral Cavity Assessment: Within Functional Limits Oral Care Completed by SLP: Recent completion by staff Oral  Cavity - Dentition: Adequate natural dentition Vision: Functional for self-feeding Self-Feeding Abilities: Able to feed self Patient Positioning: Upright in chair Baseline Vocal Quality: Normal Volitional Cough: Cognitively unable to elicit Volitional Swallow: Able to elicit    Oral/Motor/Sensory Function Overall Oral Motor/Sensory Function: Mild impairment Facial ROM: Reduced right Facial Symmetry: Abnormal symmetry right;Suspected CN VII (facial) dysfunction Facial Sensation: Reduced right;Suspected CN V (Trigeminal) dysfunction Lingual ROM: Within Functional Limits Lingual Symmetry: Within Functional Limits Lingual Strength: Within Functional Limits Mandible: Within Functional Limits   Ice Chips Ice chips: Within functional limits   Thin Liquid Thin Liquid: Within functional limits    Nectar Thick Nectar Thick Liquid: Not tested   Honey Thick Honey Thick Liquid: Not tested   Puree Puree: Within functional limits   Solid     Solid: Within functional limits      [name] 11/23/2023,10:52 AM  Palma L. Vona, MA CCC/SLP Clinical Specialist - Acute Care SLP Acute Rehabilitation Services Office number [phone]

## 2023-11-23 NOTE — Plan of Care (Signed)
  Problem: Education: Goal: Knowledge of disease or condition will improve Outcome: Progressing   Problem: Ischemic Stroke/TIA Tissue Perfusion: Goal: Complications of ischemic stroke/TIA will be minimized Outcome: Progressing   Problem: Coping: Goal: Will verbalize positive feelings about self Outcome: Progressing

## 2023-11-23 NOTE — Progress Notes (Addendum)
 STROKE TEAM PROGRESS NOTE   INTERIM HISTORY/SUBJECTIVE Wife is at bedside.  No new neurological events overnight. Patient to receive 2 units of blood today given Hgb 6.7.   CBC    Component Value Date/Time   WBC 6.9 11/23/2023 0637   RBC 2.27 (L) 11/23/2023 0637   HGB 6.7 (LL) 11/23/2023 0637   HGB 9.0 (L) 07/15/2023 1309   HGB 13.5 04/22/2017 1023   HCT 21.3 (L) 11/23/2023 0637   HCT 39.2 04/22/2017 1023   PLT 135 (L) 11/23/2023 0637   PLT 170 07/15/2023 1309   PLT 164 04/22/2017 1023   MCV 93.8 11/23/2023 0637   MCV 95 04/22/2017 1023   MCH 29.5 11/23/2023 0637   MCHC 31.5 11/23/2023 0637   RDW 19.9 (H) 11/23/2023 0637   RDW 14.0 04/22/2017 1023   LYMPHSABS 0.8 11/20/2023 0959   MONOABS 0.5 11/20/2023 0959   EOSABS 0.2 11/20/2023 0959   BASOSABS 0.0 11/20/2023 0959    BMET    Component Value Date/Time   NA 142 11/23/2023 0637   NA 139 07/05/2019 1045   K 3.3 (L) 11/23/2023 0637   CL 115 (H) 11/23/2023 0637   CO2 19 (L) 11/23/2023 0637   GLUCOSE 103 (H) 11/23/2023 0637   BUN 18 11/23/2023 0637   BUN 11 07/05/2019 1045   CREATININE 1.22 11/23/2023 0637   CALCIUM  7.4 (L) 11/23/2023 0637   GFRNONAA >60 11/23/2023 9362    IMAGING past 24 hours No results found.   Vitals:   11/23/23 1000 11/23/23 1100 11/23/23 1200 11/23/23 1300  BP: (!) 123/58 127/62 122/60 130/68  Pulse: 77 83 80 88  Resp: 20 17 19 17   Temp:      TempSrc:      SpO2: 100% 100% 100% 94%  Weight:      Height:         PHYSICAL EXAM General: Critically ill elderly male in no apparent distress  psych:  Mood and affect appropriate for situation CV: Regular rate and rhythm on monitor Respiratory:  Regular, unlabored respirations on room air GI: Abdomen soft and nontender   NEURO:  Mental Status: Awake and alert.  Still with moderate aphasia however is able to repeat and follow commands. speech is dysarthric and garbled  Cranial Nerves:  II: PERRL. Visual fields with right  hemianopia III, IV, VI: EOM with left gaze preference, can get to midline but cannot cross V: Sensation is intact to light touch and symmetrical to face.  VII: Right facial droop VIII: hearing intact to voice. IX, X: Palate elevates symmetrically. Phonation is normal.  KP:Dynloizm shrug 5/5. XII: tongue is midline without fasciculations. Motor: Right arm flaccid with minimal movement, right leg with drift, left arm and left leg antigravity and spontaneous movement Tone: is normal and bulk is normal Sensation-decreased on right arm, withdraws on right leg.  Has right neglect Coordination: FTN intact bilaterally, HKS: no ataxia in BLE.No drift.  Gait- deferred  Most Recent NIH  1a Level of Conscious.:  1b LOC Questions: 2 1c LOC Commands: 1 2 Best Gaze: 1 3 Visual: 1 4 Facial Palsy: 1 5a Motor Arm - left: 0 5b Motor Arm - Right: 4 6a Motor Leg - Left:  6b Motor Leg - Right: 2 7 Limb Ataxia: 0 8 Sensory: 1 9 Best Language: 2 10 Dysarthria: 2 11 Extinct. and Inatten.: 2 TOTAL: 37   ASSESSMENT/PLAN  [name] is a 73 y.o. male with history of HTN, DVT/PE on eliquis , bladder  cancer s/p nephrostomy  admitted for right-sided weakness, left gaze and aphasia .  NIH on Admission 24  Stroke:  left MCA scattered infarcts with left tICA and M1 occlusion s/p IR with TICI2c Etiology: Likely due to hypercoagulable state in the setting of advanced cancer Code Stroke CT head No acute abnormality.   CTA head & neck 8/2 Positive for Left ICA terminus Occlusion with decreased L MCA flow S/p IR with left tICA and M1 occlusion with TICI2c MRI 8/3 Multiple scattered small foci of acute ischemia within the left MCA territory, greatest in the frontal lobe. Punctate foci of acute ischemia in the right occipital lobe and right frontal white matter. 2D Echo EF 50-55%, bubble study negative US  LE positive for age indeterminate DVTs: RT - PopV and PTV LT - CFV, PFV, FV, PopV, Gastrocs  TCD  bubble study no DVT LDL 112 HgbA1c 5.7 VTE prophylaxis - SCDs Eliquis  prior to admission, now on ASA 81. Will resume eliquis  in a couple of days once anemia stabilized   Therapy recommendations:  CIR Disposition: Pending  Recurrent stroke: extension of left MCA scattered infarcts with left M1 re-occlusion s/p IR with TICI 2b due to dissection and hypotension. Acute neuro change with PT and sitting up, BP was low, received head of bed flat and IVF, BP improved but still has worsening left MCA syndrome Repeat CT head 8/3 - no acute process Repeat CTA head and neck with Left M1 branch occlusion at its origin  CTP core infarct 4cc, mismatch 43 cc Repeat IR with TICI2b Post IR repeat CTA showed interval recanalization of the M1 segment of the left MCA. Moderate stenosis distally in the recanalized M1 segment of the left MCA, which is also diffusely irregular. MRI brain repeat showed scattered left MCA infarcts, extended from prior MRA left MCA patent  Cangrelor  stopped 8/5 - on ASA 81. Will resume eliquis  in a couple of days if anemia stable  History of DVT/PE Hx of multiple b/l DVT and PE Had IVC filter placed ~ 2014 Likely related to hypercoagulable state from advanced malignancy Home Meds: Eliquis  2.5 bid US  LE positive for age indeterminate DVTs: RT - PopV and PTV LT - CFV, PFV, FV, PopV, Gastrocs  Eliquis  on hold due to anemia, may resume in a couple of days if anemia stable  Hx of hypertension Orthostatic hypotension Home meds: Amlodipine  5 mg, carvedilol  12.5 mg Orthostasis 8/4 am with low BP on sitting, improved with supine and IVF Off cleviprex  and Neo BP stable now Avoid low BP Long term BP goal normotensive  Hyperlipidemia Home meds: None LDL 112, goal < 70 Add atorvastatin  40  Continue statin at discharge  Bladder cancer s/p bilateral nephrostomy tubes AKI UTI Fever Poorly differentiated bladder cancer, now stage IV, metastatic to the bone by MRI of the spine on  11/08/2023.  Cr 1.3 -1.29-1.44 -1.27--1.22  UA WBC > 50 On Rocephin  for 7 day course Fever Tmax 101.5--100.4--afebrile  Anemia Hgb 9.0-8.8-8.2 -7.8-6.7-PRBC 2U-pending Likely due to hemodilution with IV fluid on top of anemia of chronic disease CBC monitoring May consider Eliquis  after anemia stabilized  Dysphagia, improved Patient has post-stroke dysphagia Speech consulted Not passing swallow On diet, DC IV fluid  Other Stroke Risk Factors Obesity, Body mass index is 33.06 kg/m., BMI >/= 30 associated with increased stroke risk, recommend weight loss, diet and exercise as appropriate   Other acute medical issues Hx of prostate cancer, on ADT H/o laryngeal cancer in 2021. S/p radiation therapy.  Harlene Pouch, MSN, NP-C [facility] See AMION or use Epic Chat  ATTENDING NOTE: I reviewed above note and agree with the assessment and plan. Pt was seen and examined.   Wife at bedside.  Patient sitting in chair, awake alert, speech continue to improve, now able to repeat 5 word sentence, able to name although with psychomotor slowing.  Follow simple commands, right upper extremity now 3+/5, right lower extremity 4+/5.  BP stable on sitting position.  However hemoglobin 6.7, likely due to hemodilution on top of chronic anemia.  Will give 2 unit PRBC transfusion.  Continue CBC monitoring.  Fever resolved, continue Rocephin  for 7-day course.  Will consider Eliquis  once anemia stabilized.  Now on aspirin  81 and statin.  PT and OT recommend CIR.  For detailed assessment and plan, please refer to above as I have made changes wherever appropriate.   Ary Cummins, MD PhD Stroke Neurology 11/23/2023 5:47 PM  This patient is critically ill due to stroke and recurrent stroke with LVO, status post IR x 2, orthostatic hypotension, DVT, bladder cancer with metastasis, anemia and at significant risk of neurological worsening, death form recurrent stroke, hemorrhagic termination,  syncope, PE, severe anemia. This patient's care requires constant monitoring of vital signs, hemodynamics, respiratory and cardiac monitoring, review of multiple databases, neurological assessment, discussion with family, other specialists and medical decision making of high complexity. I spent 35 minutes of neurocritical care time in the care of this patient.   To contact Stroke Continuity provider, please refer to [email or web address]. After hours, contact General Neurology

## 2023-11-23 NOTE — Progress Notes (Signed)
 Initial Nutrition Assessment  DOCUMENTATION CODES:   Not applicable  INTERVENTION:  Magic cup TID with meals, each supplement provides 290 kcal and 9 grams of protein  Encouraged adequate intake of meals and supplements to help meet calorie and protein needs  Pt appears to be refeeding, added thiamine  100mg  for 7 days and MVI w/ minerals daily; repletion for phosphorus and potassium ordered; will continue to monitor electrolytes  Discontinued Cortrak order, passed SLP eval and on regular diet with no restrictions  NUTRITION DIAGNOSIS:   Inadequate oral intake related to inability to eat as evidenced by NPO status. Pt has not eaten since admission due to recent NPO status, now has regular diet ordered, will monitor progress  GOAL:   Patient will meet greater than or equal to 90% of their needs  MONITOR:   PO intake, Supplement acceptance  REASON FOR ASSESSMENT:   Consult Enteral/tube feeding initiation and management  ASSESSMENT:   Pt with hx HTN, DVT, and bladder/laryngeal/prostate cancer (s/p nephrostomy 2021). Pt admitted with R sided weakness, L gaze and aphasia, code stroke activated.  Pt with continued weakness and aphasia. SLP eval conducted this morning and pt passed, placed on regular diet no restrictions. Pt appears to be refeeding, potassium and phosphorus low, repletion ordered. Added thiamine  and MVI w/ minerals and will continue to monitor electrolyte labs.  Pt sitting in bedside chair at time of assessment. Wife at bedside. Pt continues to have aphasia but seems to be making progress. Pt was able to answer most questions, but took some time to answer. Pt reports good appetite and he is hungry. Wife plans on ordering him lunch once regular diet order is in. Pt agreeable to try magic cups on trays to help supplement calories and protein while he recovers. Reports no GI discomforts at this time.  PTA, pt reports eating 2-3 x per day. He would typically eat  breakfast, light lunch and dinner. Pt reports no issues with appetite at this time. Reports eating variety of foods including fruits, vegetables, and protein at most meals. Pt was not drinking any ONS PTA, but reports taking B12, magnesium, and calcium  supplements daily. Ordered MVI w/ minerals to continue while admitted.   Pt reports no recent wt changes or mobility issues PTA. Nutrition focused physical exam shows mild fat depletions and only mild muscle depletion in the temple. Due to BLE edema, unable to assess muscle storage of lower extremities. Unable to diagnose malnutrition at this time, but pt is currently refeeding and suspect edema may be masking depletions.  Medications reviewed and include:  Ceftriaxone  Thiamine  100 mg daily MVI w/ minerals daily Potassium & sodium phosphates  packet  Labs reviewed:  CBG x 24 hr: 90-113 mg/dL Potassium 3.3 Phosphorus 2.1 A1c 5.7  NUTRITION - FOCUSED PHYSICAL EXAM:  Flowsheet Row Most Recent Value  Orbital Region Mild depletion  Upper Arm Region Mild depletion  Thoracic and Lumbar Region No depletion  Buccal Region No depletion  Temple Region Mild depletion  Clavicle Bone Region No depletion  Clavicle and Acromion Bone Region No depletion  Scapular Bone Region No depletion  Dorsal Hand No depletion  Patellar Region Unable to assess  Anterior Thigh Region Unable to assess  Posterior Calf Region Unable to assess  Edema (RD Assessment) Mild  [BLE]  Hair Reviewed  Eyes Reviewed  Mouth Reviewed  Skin Reviewed  Nails Reviewed    Diet Order:   Diet Order  Diet regular Room service appropriate? Yes with Assist; Fluid consistency: Thin  Diet effective now                   EDUCATION NEEDS:   Education needs have been addressed  Skin:  Skin Assessment: Reviewed RN Assessment  Last BM:  8/4  Height:   Ht Readings from Last 1 Encounters:  11/20/23 5' 10 (1.778 m)    Weight:   Wt Readings from Last 1  Encounters:  11/20/23 104.5 kg    Ideal Body Weight:  75.5 kg  BMI:  Body mass index is 33.06 kg/m.  Estimated Nutritional Needs:   Kcal:  1900-2100  Protein:  85-100g  Fluid:  >/= 2L    Josette Glance, MS, RDN, LDN Clinical Dietitian I Please reach out via secure chat

## 2023-11-23 NOTE — Progress Notes (Signed)
 Date and time results received: 11/23/23 0720 (use smartphrase .now to insert current time)  Test: Hgb Critical Value: 6.7  Name of Provider Notified: DOROTHA Cummins, MD  Orders Received? Or Actions Taken?: Orders Received - See Orders for details

## 2023-11-23 NOTE — Evaluation (Signed)
 Speech Language Pathology Evaluation Patient Details Name: Allen Macdonald MRN: [id number] DOB: April 10, 1951 Today's Date: 11/23/2023 Time: 1007-1040 SLP Time Calculation (min) (ACUTE ONLY): 33 min  Problem List:  Patient Active Problem List   Diagnosis Date Noted   Acute ischemic left MCA stroke (HCC) 11/20/2023   Iron deficiency anemia due to chronic blood loss 08/03/2023   Acquired hallux rigidus, left 11/20/2020   Plantar fasciitis 11/20/2020   Strain of left foot 11/20/2020   Laryngeal cancer (HCC) squamous cell 02/25/2020   History of pulmonary embolus (PE)    PC (prostate cancer) (HCC) 08/28/2019   Abnormal stress test minimal ischemia involving apex 05/01/2019   History of DVT (deep vein thrombosis) 04/22/2017   Left knee injury 12/29/2015   Dilated cardiomyopathy (HCC) 12/18/2015   Essential hypertension 12/18/2015   History of pulmonary embolism 12/18/2015   Past Medical History:  Past Medical History:  Diagnosis Date   Abnormal stress test minimal ischemia involving apex 05/01/2019   Bladder cancer (HCC)    Dilated cardiomyopathy (HCC)    Essential hypertension 12/18/2015   History of DVT (deep vein thrombosis) 04/22/2017   History of pulmonary embolism 12/18/2015   Laryngeal cancer (HCC) squamous cell 02/25/2020   PC (prostate cancer) (HCC) 08/28/2019   Past Surgical History:  Past Surgical History:  Procedure Laterality Date   CATARACT EXTRACTION Bilateral    INSERTION PROSTATE RADIATION SEED  2003   IR CT HEAD LTD  11/20/2023   IR PERCUTANEOUS ART THROMBECTOMY/INFUSION INTRACRANIAL INC DIAG ANGIO  11/20/2023   IR PERCUTANEOUS ART THROMBECTOMY/INFUSION INTRACRANIAL INC DIAG ANGIO  11/21/2023   IR US  GUIDE VASC ACCESS RIGHT  11/20/2023   IR US  GUIDE VASC ACCESS RIGHT  11/21/2023   PROSTATE SURGERY     RADIOLOGY WITH ANESTHESIA N/A 11/20/2023   Procedure: RADIOLOGY WITH ANESTHESIA;  Surgeon: Radiologist, Medication, MD;  Location: [facility] OR;  Service: Radiology;  Laterality:  N/A;   RADIOLOGY WITH ANESTHESIA N/A 11/21/2023   Procedure: RADIOLOGY WITH ANESTHESIA;  Surgeon: Radiologist, Medication, MD;  Location: [facility] OR;  Service: Radiology;  Laterality: N/A;   TONSILLECTOMY     Venacava Filter     WISDOM TOOTH EXTRACTION     HPI:  73 y.o. male presents to [facility] hospital on 11/20/2023 with R weakness, L gaze and expressive aphasia. CTA with L ICA occlusion, pt underwent thrombectomy on 8/3. Pt with neuro change on 8/4, found to have L M1 occlusion and underwent subsequent thrombectomy. PMH includes laryngeal cancer 2021 with 30 Rx treatments, HTN, DVT/PE, stage IV bladder CA s/p nephrostomy.   Assessment / Plan / Recommendation Clinical Impression  Allen Macdonald presents with a non-fluent primary expressive aphasia with a milder component of auditory comprehension deficit.  Output is limited to phrase length of 2-4 words.  Naming to confrontation: 60% accuracy; responsive naming: 40% accuracy. Naming errors are notable for phonemic paraphasias and recurrent perseverations.  Propositional speech and providing info about himself is very difficult, leading to repetition of um, um, um then expression of frustration.  Repetition of common phrases is intact. Body part id and following one and two step commands was WNL. Right and left discrimination was 20% accurate. Comprehension of basic conversation was functional. Discussed with pt and his wife the nature of aphasia and the promising results of today's assessment. He will benefit from intensive aphasia therapy at next level of care. Will need frequent supervision at home given communication needs. SLP will follow while on acute care.    SLP Assessment  SLP Recommendation/Assessment: Patient needs continued Speech Language Pathology Services SLP Visit Diagnosis: Aphasia (R47.01)     Assistance Recommended at Discharge     Functional Status Assessment Patient has had a recent decline in their functional status and demonstrates the  ability to make significant improvements in function in a reasonable and predictable amount of time.  Frequency and Duration min 2x/week  2 weeks      SLP Evaluation Cognition  Overall Cognitive Status: Within Functional Limits for tasks assessed       Comprehension  Auditory Comprehension Overall Auditory Comprehension: Impaired Yes/No Questions: Within Functional Limits Commands: Impaired Multistep Basic Commands: 25-49% accurate Conversation: Simple EffectiveTechniques: Extra processing time;Stressing words;Visual/Gestural cues Visual Recognition/Discrimination Discrimination: Exceptions to [facility] L/R Discrimination:  (20% accuracy) Reading Comprehension Reading Status: Not tested    Expression Expression Primary Mode of Expression: Verbal Verbal Expression Overall Verbal Expression: Impaired Initiation: No impairment Level of Generative/Spontaneous Verbalization: Phrase Repetition: Impaired Level of Impairment: Sentence level Naming: Impairment Responsive: 0-25% accurate Confrontation: Impaired Verbal Errors: Phonemic paraphasias;Perseveration Pragmatics: No impairment Written Expression Dominant Hand: Left Written Expression: Not tested   Oral / Motor  Oral Motor/Sensory Function Overall Oral Motor/Sensory Function: Mild impairment Facial ROM: Reduced right Facial Symmetry: Abnormal symmetry right;Suspected CN VII (facial) dysfunction Facial Sensation: Reduced right;Suspected CN V (Trigeminal) dysfunction Lingual ROM: Within Functional Limits Lingual Symmetry: Within Functional Limits Lingual Strength: Within Functional Limits Velum: Within Functional Limits Mandible: Within Functional Limits Motor Speech Overall Motor Speech: Appears within functional limits for tasks assessed            Allen Macdonald 11/23/2023, 11:08 AM Allen L. Vona, MA CCC/SLP Clinical Specialist - Acute Care SLP Acute Rehabilitation Services Office number  [phone]

## 2023-11-23 NOTE — Evaluation (Signed)
 Occupational Therapy Evaluation Patient Details Name: Allen Macdonald MRN: [id number] DOB: 02/05/1951 Today's Date: 11/23/2023   History of Present Illness   73 y.o. male presents to [facility] hospital on 11/20/2023 with R weakness, L gaze and expressive aphasia. CTA with L ICA occlusion, pt underwent thrombectomy on 8/3. Pt with neuro change on 8/4, found to have L M1 occlusion and underwent subsequent thrombectomy and found to have L MCA dissection. PMH includes HTN, DVT/PE, bladder CA s/p nephrostomy..     Clinical Impressions Pt ind at baseline with ADLs/functional mobility,  lives with spouse. Pt needing up to mod A for ADLs, CGA for bed mobility and min A +2 for transfers with 2 person HHA. Pt noted to have R weakness, but also difficulty with coordination bilaterally in R > L hands. Pt presenting with impairments listed below, will follow acutely. Patient will benefit from intensive inpatient follow-up therapy, >3 hours/day to maximize safety/ind with ADL/functional mobility.      If plan is discharge home, recommend the following:   A lot of help with walking and/or transfers;A lot of help with bathing/dressing/bathroom;Assistance with cooking/housework;Direct supervision/assist for medications management;Direct supervision/assist for financial management;Assist for transportation;Help with stairs or ramp for entrance;Supervision due to cognitive status     Functional Status Assessment   Patient has had a recent decline in their functional status and demonstrates the ability to make significant improvements in function in a reasonable and predictable amount of time.     Equipment Recommendations   Other (comment) (defer)     Recommendations for Other Services   PT consult;Rehab consult     Precautions/Restrictions   Precautions Precautions: Fall Recall of Precautions/Restrictions: Impaired Restrictions Weight Bearing Restrictions Per Provider Order: No     Mobility  Bed Mobility Overal bed mobility: Needs Assistance Bed Mobility: Supine to Sit     Supine to sit: Contact guard, HOB elevated          Transfers Overall transfer level: Needs assistance Equipment used: 2 person hand held assist Transfers: Sit to/from Stand, Bed to chair/wheelchair/BSC Sit to Stand: Min assist     Step pivot transfers: Min assist, +2 safety/equipment            Balance Overall balance assessment: Needs assistance Sitting-balance support: No upper extremity supported, Feet supported Sitting balance-Leahy Scale: Good     Standing balance support: Single extremity supported, Reliant on assistive device for balance Standing balance-Leahy Scale: Poor                             ADL either performed or assessed with clinical judgement   ADL Overall ADL's : Needs assistance/impaired Eating/Feeding: Minimal assistance;Sitting   Grooming: Minimal assistance;Sitting   Upper Body Bathing: Moderate assistance;Sitting   Lower Body Bathing: Moderate assistance;Sitting/lateral leans;Sit to/from stand   Upper Body Dressing : Moderate assistance;Standing;Sitting   Lower Body Dressing: Moderate assistance   Toilet Transfer: Minimal assistance;Ambulation;Regular Social worker and Hygiene: Contact guard assist;Sitting/lateral lean       Functional mobility during ADLs: Minimal assistance       Vision   Vision Assessment?: Vision impaired- to be further tested in functional context Additional Comments: R hemianopsia, will further assess     Perception Perception: Not tested       Praxis Praxis: Not tested       Pertinent Vitals/Pain Pain Assessment Pain Assessment: No/denies pain     Extremity/Trunk Assessment Upper Extremity Assessment  Upper Extremity Assessment: RUE deficits/detail;LUE deficits/detail RUE Deficits / Details: shoulder AROM to 90*, mild edema, impaired fine motor coordination, 3/5  grasp RUE Coordination: decreased fine motor;decreased gross motor LUE Deficits / Details: mild edema, incr time to motor plan basic movements thumbs up peace sign ok sign LUE Coordination: decreased fine motor   Lower Extremity Assessment Lower Extremity Assessment: Defer to PT evaluation   Cervical / Trunk Assessment Cervical / Trunk Assessment: Normal   Communication Communication Communication: Impaired Factors Affecting Communication: Difficulty expressing self   Cognition Arousal: Alert Behavior During Therapy: Flat affect Cognition: Difficult to assess Difficult to assess due to: Impaired communication           OT - Cognition Comments: difficulty expressing self                 Following commands: Impaired Following commands impaired: Follows multi-step commands inconsistently     Cueing  General Comments   Cueing Techniques: Verbal cues;Visual cues;Gestural cues  VSS   Exercises     Shoulder Instructions      Home Living Family/patient expects to be discharged to:: Private residence Living Arrangements: Spouse/significant other Available Help at Discharge: Family;Available 24 hours/day Type of Home: House Home Access: Stairs to enter [employer] of Steps: 3 Entrance Stairs-Rails: Can reach both Home Layout: One level               Home Equipment: Cane - single point;Toilet riser;Rolling Walker (2 wheels);Rollator (4 wheels);Wheelchair - Manufacturing systems engineer      Lives With: Spouse    Prior Functioning/Environment Prior Level of Function : Independent/Modified Independent;Driving                    OT Problem List: Decreased strength;Decreased range of motion;Decreased activity tolerance;Impaired balance (sitting and/or standing);Impaired vision/perception;Decreased cognition;Decreased safety awareness;Decreased coordination;Cardiopulmonary status limiting activity;Impaired sensation   OT  Treatment/Interventions: Self-care/ADL training;Therapeutic exercise;Energy conservation;DME and/or AE instruction;Cognitive remediation/compensation;Therapeutic activities;Visual/perceptual remediation/compensation;Patient/family education;Balance training      OT Goals(Current goals can be found in the care plan section)   Acute Rehab OT Goals Patient Stated Goal: none stated OT Goal Formulation: With patient Time For Goal Achievement: 12/07/23 Potential to Achieve Goals: Good   OT Frequency:  Min 2X/week    Co-evaluation PT/OT/SLP Co-Evaluation/Treatment: Yes Reason for Co-Treatment: Complexity of the patient's impairments (multi-system involvement);Necessary to address cognition/behavior during functional activity;For patient/therapist safety;To address functional/ADL transfers   OT goals addressed during session: ADL's and self-care      AM-PAC OT 6 Clicks Daily Activity     Outcome Measure Help from another person eating meals?: A Little Help from another person taking care of personal grooming?: A Little Help from another person toileting, which includes using toliet, bedpan, or urinal?: A Lot Help from another person bathing (including washing, rinsing, drying)?: A Lot Help from another person to put on and taking off regular upper body clothing?: A Lot Help from another person to put on and taking off regular lower body clothing?: A Lot 6 Click Score: 14   End of Session Equipment Utilized During Treatment: Gait belt Nurse Communication: Mobility status  Activity Tolerance: Patient tolerated treatment well Patient left: in chair;with call bell/phone within reach;with chair alarm set;with family/visitor present  OT Visit Diagnosis: Unsteadiness on feet (R26.81);Other abnormalities of gait and mobility (R26.89);Muscle weakness (generalized) (M62.81)                Time: 9079-9055 OT Time Calculation (min): 24 min Charges:  OT General  Charges $OT Visit: 1 Visit OT  Evaluation $OT Eval Moderate Complexity: 1 Mod  Khrystian Schauf K, OTD, OTR/L SecureChat Preferred Acute Rehab [phone]   Zinedine Ellner K Koonce 11/23/2023, 3:01 PM

## 2023-11-24 DIAGNOSIS — D6869 Other thrombophilia: Secondary | ICD-10-CM | POA: Diagnosis not present

## 2023-11-24 DIAGNOSIS — I63512 Cerebral infarction due to unspecified occlusion or stenosis of left middle cerebral artery: Secondary | ICD-10-CM | POA: Diagnosis not present

## 2023-11-24 DIAGNOSIS — I951 Orthostatic hypotension: Secondary | ICD-10-CM | POA: Diagnosis not present

## 2023-11-24 DIAGNOSIS — R29719 NIHSS score 19: Secondary | ICD-10-CM | POA: Diagnosis not present

## 2023-11-24 LAB — BPAM RBC
Blood Product Expiration Date: 202508292359
Blood Product Expiration Date: 202508302359
ISSUE DATE / TIME: 202508061446
ISSUE DATE / TIME: 202508061649
Unit Type and Rh: 6200
Unit Type and Rh: 6200

## 2023-11-24 LAB — BASIC METABOLIC PANEL WITH GFR
Anion gap: 9 (ref 5–15)
BUN: 23 mg/dL (ref 8–23)
CO2: 18 mmol/L — ABNORMAL LOW (ref 22–32)
Calcium: 7.9 mg/dL — ABNORMAL LOW (ref 8.9–10.3)
Chloride: 114 mmol/L — ABNORMAL HIGH (ref 98–111)
Creatinine, Ser: 1.42 mg/dL — ABNORMAL HIGH (ref 0.61–1.24)
GFR, Estimated: 52 mL/min — ABNORMAL LOW (ref >60)
Glucose, Bld: 114 mg/dL — ABNORMAL HIGH (ref 70–99)
Potassium: 3.6 mmol/L (ref 3.5–5.1)
Sodium: 141 mmol/L (ref 135–145)

## 2023-11-24 LAB — MAGNESIUM: Magnesium: 1.9 mg/dL (ref 1.7–2.4)

## 2023-11-24 LAB — TYPE AND SCREEN
ABO/RH(D): A POS
Antibody Screen: NEGATIVE
Unit division: 0
Unit division: 0

## 2023-11-24 LAB — CBC
HCT: 28.9 % — ABNORMAL LOW (ref 39.0–52.0)
Hemoglobin: 9.7 g/dL — ABNORMAL LOW (ref 13.0–17.0)
MCH: 29.9 pg (ref 26.0–34.0)
MCHC: 33.6 g/dL (ref 30.0–36.0)
MCV: 89.2 fL (ref 80.0–100.0)
Platelets: 141 K/uL — ABNORMAL LOW (ref 150–400)
RBC: 3.24 MIL/uL — ABNORMAL LOW (ref 4.22–5.81)
RDW: 19.3 % — ABNORMAL HIGH (ref 11.5–15.5)
WBC: 6.8 K/uL (ref 4.0–10.5)
nRBC: 0 % (ref 0.0–0.2)

## 2023-11-24 LAB — PSA: Prostatic Specific Antigen: 0.01 ng/mL (ref 0.00–4.00)

## 2023-11-24 LAB — GLUCOSE, CAPILLARY
Glucose-Capillary: 85 mg/dL (ref 70–99)
Glucose-Capillary: 122 mg/dL — ABNORMAL HIGH (ref 70–99)
Glucose-Capillary: 106 mg/dL — ABNORMAL HIGH (ref 70–99)

## 2023-11-24 LAB — PHOSPHORUS: Phosphorus: 4.2 mg/dL (ref 2.5–4.6)

## 2023-11-24 MED ORDER — AMLODIPINE BESYLATE 5 MG PO TABS
5.0000 mg | ORAL_TABLET | Freq: Every day | ORAL | Status: DC
  Administered 2023-11-24 – 2023-11-25 (×2): 5 mg via ORAL
  Filled 2023-11-24 (×2): qty 1

## 2023-11-24 MED ORDER — ENSURE PLUS HIGH PROTEIN PO LIQD
237.0000 mL | Freq: Two times a day (BID) | ORAL | Status: DC
  Administered 2023-11-25: 237 mL via ORAL

## 2023-11-24 MED ORDER — ORAL CARE MOUTH RINSE: 15.0000 mL | OROMUCOSAL | Status: DC | PRN

## 2023-11-24 MED ORDER — POTASSIUM CHLORIDE CRYS ER 20 MEQ PO TBCR
40.0000 meq | EXTENDED_RELEASE_TABLET | Freq: Once | ORAL | Status: AC
  Administered 2023-11-24: 40 meq via ORAL
  Filled 2023-11-24: qty 2

## 2023-11-24 MED ORDER — APIXABAN 5 MG PO TABS
5.0000 mg | ORAL_TABLET | Freq: Two times a day (BID) | ORAL | Status: DC
  Administered 2023-11-25: 5 mg via ORAL
  Filled 2023-11-24: qty 1

## 2023-11-24 NOTE — Progress Notes (Signed)
 Speech Language Pathology Treatment: Cognitive-Linguistic  Patient Details Name: Allen Macdonald MRN: [id number] DOB: 12-04-1950 Today's Date: 11/24/2023 Time: 8884-8864 SLP Time Calculation (min) (ACUTE ONLY): 20 min  Assessment / Plan / Recommendation Clinical Impression  Pt demonstrated improved spontaneity of speech today.  Fluency showing some improvement - today he was able to produce a nine-word phrase.Generated automatic sequences with verbal cues needed to backtrack in order to move through entire sequence.  He executed DOW with success using this strategy. Named functional items in room with self-selection with 70% accuracy.  Followed two-step commands with 90% accuracy. Affect brighter today; wife at bedside.  Continue aphasia therapy.    HPI HPI: 73 y.o. male presents to [facility] hospital on 11/20/2023 with R weakness, L gaze and expressive aphasia. CTA with L ICA occlusion, pt underwent thrombectomy on 8/3. Pt with neuro change on 8/4, found to have L M1 occlusion and underwent subsequent thrombectomy. PMH includes laryngeal cancer 2021 with 30 Rx treatments, HTN, DVT/PE, stage IV bladder CA s/p nephrostomy.      SLP Plan  Continue with current plan of care          Recommendations                     Oral care BID     Aphasia (R47.01)     Continue with current plan of care   Allen Macdonald L. Vona, MA CCC/SLP Clinical Specialist - Acute Care SLP Acute Rehabilitation Services Office number [phone]   Allen Macdonald  11/24/2023, 12:39 PM

## 2023-11-24 NOTE — Progress Notes (Signed)
 Inpatient Rehab Admissions Coordinator:   Will send for insurance auth request today.  Updated pt/spouse at bedside.   Reche Lowers, PT, DPT Admissions Coordinator [phone] 11/24/23  12:57 PM

## 2023-11-24 NOTE — PMR Pre-admission (Signed)
 PMR Admission Coordinator Pre-Admission Assessment  Patient: Allen Macdonald is an 73 y.o., male MRN: [id number] DOB: 03-Jan-1951 Height: 5' 10 (177.8 cm) Weight: 104.5 kg  Insurance Information HMO: ***    PPO: ***     PCP:      IPA:      80/20:      OTHER:  PRIMARY: [employer] Medicare HMO/PPO     Policy#: [id number]       Subscriber: pt CM Name: ***      Phone#: ***     Fax#: *** Pre-Cert#: ***      Employer:  Benefits:  Phone #: [phone]     Name:  Eff. Date: ***     Deduct: ***      Out of Pocket Max: ***      Life Max:  CIR: ***      SNF: *** Outpatient: ***     Co-Pay: *** Home Health: ***      Co-Pay: *** DME: ***     Co-Pay: *** Providers:  SECONDARY:       Policy#:      Phone#:   Financial Counselor:       Phone#:   The "Data Collection Information Summary" for patients in Inpatient Rehabilitation Facilities with attached "Privacy Act Statement-Health Care Records" was provided and verbally reviewed with: Patient and Family  Emergency Contact Information Contact Information     Name Relation Home Work Mobile   [place] Spouse [phone]      Other Contacts   None on File     Current Medical History  Patient Admitting Diagnosis: L MCA CVA  History of Present Illness: Pt is a 73 y/o male with PMH of HTN, DVT/PE, bladder cancer s/p nephrostomy who was admitted to [facility] on 11/20/23 with R hemiparesis, L gaze preference, and aphasia.  Pt on eliquis  having recently switched from xarelto .  CT head showed no abnormality, CTA head/neck showed terminal L ICA occlusion with poor opacification of L MCA branches.  NIHSS was 24 on neurology consult.  He was not a candidate for TNK due to eliquis , but did proceed for thrombectomy with IR.  After working with PT on 8/4 pt had sudden change in status, and stat CTA/profusion scan ordered which demonstrated L MCA dissection so was taken for urgent thrombectomy which did not result in any improvement in  functional status.  Stroke workup revealed MRI confirmed L MCA strokes as well as R occipital lobe and R frontal white matter, age indeterminate DVTs in LEs (see report).  Hospital course complicated by ABLA requiring 2u of blood.  Eliquis  to be resumed once hgb stable.  Therapy ongoing and he was recommended for CIR.   Complete NIHSS TOTAL: 7  Patient's medical record from [facility] has been reviewed by the rehabilitation admission coordinator and physician.  Past Medical History  Past Medical History:  Diagnosis Date   Abnormal stress test minimal ischemia involving apex 05/01/2019   Bladder cancer (HCC)    Dilated cardiomyopathy (HCC)    Essential hypertension 12/18/2015   History of DVT (deep vein thrombosis) 04/22/2017   History of pulmonary embolism 12/18/2015   Laryngeal cancer (HCC) squamous cell 02/25/2020   PC (prostate cancer) (HCC) 08/28/2019    Has the patient had major surgery during 100 days prior to admission? Yes  Family History   family history includes Heart failure in his mother; Other in his father; Pleurisy in his mother.  Current Medications  Current Facility-Administered  Medications:    acetaminophen  (TYLENOL ) tablet 650 mg, 650 mg, Oral, Q4H PRN **OR** acetaminophen  (TYLENOL ) 160 MG/5ML solution 650 mg, 650 mg, Per Tube, Q4H PRN **OR** acetaminophen  (TYLENOL ) suppository 650 mg, 650 mg, Rectal, Q4H PRN, Lehner, Erin C, NP, 650 mg at 11/22/23 1035   [START ON 11/25/2023] apixaban  (ELIQUIS ) tablet 5 mg, 5 mg, Oral, BID, Williams, Jessica N, NP   aspirin  EC tablet 81 mg, 81 mg, Oral, Daily, Jerri Pfeiffer, MD, 81 mg at 11/24/23 0803   atorvastatin  (LIPITOR ) tablet 80 mg, 80 mg, Oral, Daily, Waddell Aquas A, NP, 80 mg at 11/24/23 0803   cefTRIAXone  (ROCEPHIN ) 1 g in sodium chloride  0.9 % 100 mL IVPB, 1 g, Intravenous, Q24H, Jerri Pfeiffer, MD, Last Rate: 200 mL/hr at 11/24/23 9167, Infusion Verify at 11/24/23 9167   Chlorhexidine  Gluconate Cloth 2 % PADS 6 each, 6 each,  Topical, Daily, Michaela Aisha SQUIBB, MD, 6 each at 11/24/23 0803   feeding supplement (ENSURE PLUS HIGH PROTEIN) liquid 237 mL, 237 mL, Oral, BID BM, Jerri Pfeiffer, MD   labetalol  (NORMODYNE ) injection 10 mg, 10 mg, Intravenous, Q2H PRN, Lehner, Erin C, NP   multivitamin with minerals tablet 1 tablet, 1 tablet, Oral, Daily, Jerri Pfeiffer, MD, 1 tablet at 11/24/23 0803   Oral care mouth rinse, 15 mL, Mouth Rinse, PRN, Jerri Pfeiffer, MD   senna-docusate (Senokot-S) tablet 1 tablet, 1 tablet, Oral, QHS PRN, Lehner, Erin C, NP   thiamine  (VITAMIN B1) tablet 100 mg, 100 mg, Oral, Daily, Jerri Pfeiffer, MD, 100 mg at 11/24/23 0803  Patients Current Diet:  Diet Order             Diet regular Room service appropriate? Yes with Assist; Fluid consistency: Thin  Diet effective now                   Precautions / Restrictions Precautions Precautions: Fall, Other (comment) Precaution/Restrictions Comments: bilat nephrostomy bags Restrictions Weight Bearing Restrictions Per Provider Order: No   Has the patient had 2 or more falls or a fall with injury in the past year? No  Prior Activity Level Community (5-7x/wk): independent, driving, no DME at baseline  Prior Functional Level Self Care: Did the patient need help bathing, dressing, using the toilet or eating? Independent  Indoor Mobility: Did the patient need assistance with walking from room to room (with or without device)? Independent  Stairs: Did the patient need assistance with internal or external stairs (with or without device)? Independent  Functional Cognition: Did the patient need help planning regular tasks such as shopping or remembering to take medications? Independent  Patient Information Are you of Hispanic, Latino/a,or Spanish origin?: A. No, not of Hispanic, Latino/a, or Spanish origin What is your race?: A. White Do you need or want an interpreter to communicate with a doctor or health care staff?: 0. No  Patient's  Response To:  Health Literacy and Transportation Is the patient able to respond to health literacy and transportation needs?: Yes Health Literacy - How often do you need to have someone help you when you read instructions, pamphlets, or other written material from your doctor or pharmacy?: Never In the past 12 months, has lack of transportation kept you from medical appointments or from getting medications?: No In the past 12 months, has lack of transportation kept you from meetings, work, or from getting things needed for daily living?: No  Journalist, newspaper / Equipment Home Equipment: Medical laboratory scientific officer - single point, Government social research officer, Agricultural consultant (2  wheels), Rollator (4 wheels), Wheelchair - manual, Shower seat  Prior Device Use: Indicate devices/aids used by the patient prior to current illness, exacerbation or injury? None of the above  Current Functional Level Cognition  Overall Cognitive Status: Within Functional Limits for tasks assessed Orientation Level: Oriented to person, Oriented to place, Oriented to situation, Disoriented to time    Extremity Assessment (includes Sensation/Coordination)  Upper Extremity Assessment: RUE deficits/detail, LUE deficits/detail RUE Deficits / Details: shoulder AROM to 90*, mild edema, impaired fine motor coordination, 3/5 grasp RUE Coordination: decreased fine motor, decreased gross motor LUE Deficits / Details: mild edema, incr time to motor plan basic movements thumbs up peace sign ok sign LUE Coordination: decreased fine motor  Lower Extremity Assessment: Defer to PT evaluation RLE Deficits / Details: ROM WFL, grossly 4/5    ADLs  Overall ADL's : Needs assistance/impaired Eating/Feeding: Minimal assistance, Sitting Grooming: Minimal assistance, Sitting Upper Body Bathing: Moderate assistance, Sitting Lower Body Bathing: Moderate assistance, Sitting/lateral leans, Sit to/from stand Upper Body Dressing : Moderate assistance, Standing,  Sitting Lower Body Dressing: Moderate assistance Toilet Transfer: Minimal assistance, Ambulation, Regular Toilet Toileting- Clothing Manipulation and Hygiene: Contact guard assist, Sitting/lateral lean Functional mobility during ADLs: Minimal assistance    Mobility  Overal bed mobility: Needs Assistance Bed Mobility: Supine to Sit Supine to sit: Min assist, Used rails, HOB elevated General bed mobility comments: increased time,cues for sequencing    Transfers  Overall transfer level: Needs assistance Equipment used: Rolling walker (2 wheels) Transfers: Sit to/from Stand, Bed to chair/wheelchair/BSC Sit to Stand: Min assist Bed to/from chair/wheelchair/BSC transfer type:: Step pivot Step pivot transfers: Mod assist General transfer comment: Assist to maintain balance and manage RW. Initial assist to place R hand but able to grip RW throughout transfer. Cues for sequencing and posture. No knee buckling noted. Pt with hesitant, guarded steps.    Ambulation / Gait / Stairs / Wheelchair Mobility  Ambulation/Gait Ambulation/Gait assistance: Clinical research associate (Feet): 3 Feet Assistive device: IV Pole Gait Pattern/deviations: Step-to pattern General Gait Details: pt with slowed step-to gait, increased trunk flexion with BUE support of IV pole Gait velocity: reduced Gait velocity interpretation: <1.31 ft/sec, indicative of household ambulator    Posture / Balance Balance Overall balance assessment: Needs assistance Sitting-balance support: Feet supported, No upper extremity supported Sitting balance-Leahy Scale: Good Standing balance support: Bilateral upper extremity supported, During functional activity, Reliant on assistive device for balance Standing balance-Leahy Scale: Poor    Special considerations/life events  N/a   Previous Home Environment (from acute therapy documentation) Living Arrangements: Spouse/significant other  Lives With: Spouse Available Help at  Discharge: Family, Available 24 hours/day Type of Home: House Home Layout: One level Home Access: Stairs to enter Entrance Stairs-Rails: Can reach both Entrance Stairs-Number of Steps: 3 Home Care Services: No  Discharge Living Setting Plans for Discharge Living Setting: Patient's home, Lives with (comment) (spouse) Type of Home at Discharge: House Discharge Home Layout: One level Discharge Home Access: Stairs to enter Entrance Stairs-Rails: Can reach both Entrance Stairs-Number of Steps: 3 Discharge Bathroom Shower/Tub: Walk-in shower, Tub/shower unit Discharge Bathroom Toilet: Handicapped height Discharge Bathroom Accessibility: Yes How Accessible: Accessible via walker Does the patient have any problems obtaining your medications?: No  Social/Family/Support Systems Patient Roles: Spouse Anticipated Caregiver: [name] (spouse) Anticipated Caregiver's Contact Information: [phone] Ability/Limitations of Caregiver: per chart, [name] is legally blind; however she is able to see, just not well enough to drive. Caregiver Availability: 24/7 Discharge Plan Discussed with Primary  Caregiver: Yes Is Caregiver In Agreement with Plan?: Yes Does Caregiver/Family have Issues with Lodging/Transportation while Pt is in Rehab?: No  Goals Patient/Family Goal for Rehab: PT/OT supervision to min assist; SLP supervision Expected length of stay: 12-16 days Additional Information: Discharge plan: expect discharge back to pt's home with spouse who can provide 24/7 supervision/min assist Pt/Family Agrees to Admission and willing to participate: Yes Program Orientation Provided & Reviewed with Pt/Caregiver Including Roles  & Responsibilities: Yes  Decrease burden of Care through IP rehab admission: no  Possible need for SNF placement upon discharge: Not anticipated.  Discharge plan: home with spouse who can provide 24/7 supervision to min assist.   Patient Condition: I have reviewed medical  records from [facility], spoken with CM, and patient and spouse. I met with patient at the bedside and discussed via phone for inpatient rehabilitation assessment.  Patient will benefit from ongoing PT, OT, and SLP, can actively participate in 3 hours of therapy a day 5 days of the week, and can make measurable gains during the admission.  Patient will also benefit from the coordinated team approach during an Inpatient Acute Rehabilitation admission.  The patient will receive intensive therapy as well as Rehabilitation physician, nursing, social worker, and care management interventions.  Due to safety, skin/wound care, disease management, medication administration, pain management, and patient education the patient requires 24 hour a day rehabilitation nursing.  The patient is currently min to mod assist with mobility and basic ADLs.  Discharge setting and therapy post discharge at home with home health is anticipated.  Patient has agreed to participate in the Acute Inpatient Rehabilitation Program and will admit pending insurance auth ***.  Preadmission Screen Completed By:  Reche FORBES Lowers, 11/24/2023 1:05 PM ______________________________________________________________________   Discussed status with Dr. PIERRETTE on *** at *** and received approval for admission today.  Admission Coordinator:  Caitlin E Warren, PT, time PIERRETTEPattricia ***   Assessment/Plan: Diagnosis: *** Does the need for close, 24 hr/day Medical supervision in concert with the patient's rehab needs make it unreasonable for this patient to be served in a less intensive setting? {yes_no_potentially:3041433} Co-Morbidities requiring supervision/potential complications: *** Due to {due un:6958565}, does the patient require 24 hr/day rehab nursing? {yes_no_potentially:3041433} Does the patient require coordinated care of a physician, rehab nurse, PT, OT, and SLP to address physical and functional deficits in the context of the above medical  diagnosis(es)? {yes_no_potentially:3041433} Addressing deficits in the following areas: {deficits:3041436} Can the patient actively participate in an intensive therapy program of at least 3 hrs of therapy 5 days a week? {yes_no_potentially:3041433} The potential for patient to make measurable gains while on inpatient rehab is {potential:3041437} Anticipated functional outcomes upon discharge from inpatient rehab: {functional outcomes:304600100} PT, {functional outcomes:304600100} OT, {functional outcomes:304600100} SLP Estimated rehab length of stay to reach the above functional goals is: *** Anticipated discharge destination: {anticipated dc setting:21604} 10. Overall Rehab/Functional Prognosis: {potential:3041437}   MD Signature: ***

## 2023-11-24 NOTE — Plan of Care (Signed)
  Problem: Education: Goal: Knowledge of disease or condition will improve Outcome: Progressing   Problem: Ischemic Stroke/TIA Tissue Perfusion: Goal: Complications of ischemic stroke/TIA will be minimized Outcome: Progressing   Problem: Coping: Goal: Will verbalize positive feelings about self Outcome: Progressing

## 2023-11-24 NOTE — Plan of Care (Signed)
  Problem: Education: Goal: Knowledge of disease or condition will improve Outcome: Progressing Goal: Knowledge of secondary prevention will improve (MUST DOCUMENT ALL) Outcome: Progressing Goal: Knowledge of patient specific risk factors will improve (DELETE if not current risk factor) Outcome: Progressing   Problem: Ischemic Stroke/TIA Tissue Perfusion: Goal: Complications of ischemic stroke/TIA will be minimized Outcome: Progressing   Problem: Coping: Goal: Will verbalize positive feelings about self Outcome: Progressing Goal: Will identify appropriate support needs Outcome: Progressing   Problem: Health Behavior/Discharge Planning: Goal: Ability to manage health-related needs will improve Outcome: Progressing Goal: Goals will be collaboratively established with patient/family Outcome: Progressing   Problem: Self-Care: Goal: Ability to participate in self-care as condition permits will improve Outcome: Progressing Goal: Verbalization of feelings and concerns over difficulty with self-care will improve Outcome: Progressing Goal: Ability to communicate needs accurately will improve Outcome: Progressing   Problem: Nutrition: Goal: Risk of aspiration will decrease Outcome: Progressing Goal: Dietary intake will improve Outcome: Progressing   Problem: Education: Goal: Knowledge of General Education information will improve Description: Including pain rating scale, medication(s)/side effects and non-pharmacologic comfort measures Outcome: Progressing   Problem: Health Behavior/Discharge Planning: Goal: Ability to manage health-related needs will improve Outcome: Progressing   Problem: Clinical Measurements: Goal: Ability to maintain clinical measurements within normal limits will improve Outcome: Progressing Goal: Will remain free from infection Outcome: Progressing Goal: Diagnostic test results will improve Outcome: Progressing Goal: Respiratory complications will  improve Outcome: Progressing Goal: Cardiovascular complication will be avoided Outcome: Progressing   Problem: Activity: Goal: Risk for activity intolerance will decrease Outcome: Progressing   Problem: Nutrition: Goal: Adequate nutrition will be maintained Outcome: Progressing   Problem: Coping: Goal: Level of anxiety will decrease Outcome: Progressing   Problem: Elimination: Goal: Will not experience complications related to bowel motility Outcome: Progressing Goal: Will not experience complications related to urinary retention Outcome: Progressing   Problem: Pain Managment: Goal: General experience of comfort will improve and/or be controlled Outcome: Progressing   Problem: Safety: Goal: Ability to remain free from injury will improve Outcome: Progressing   Problem: Skin Integrity: Goal: Risk for impaired skin integrity will decrease Outcome: Progressing   Problem: Education: Goal: Understanding of CV disease, CV risk reduction, and recovery process will improve Outcome: Progressing Goal: Individualized Educational Video(s) Outcome: Progressing   Problem: Activity: Goal: Ability to return to baseline activity level will improve Outcome: Progressing   Problem: Cardiovascular: Goal: Ability to achieve and maintain adequate cardiovascular perfusion will improve Outcome: Progressing Goal: Vascular access site(s) Level 0-1 will be maintained Outcome: Progressing   Problem: Health Behavior/Discharge Planning: Goal: Ability to safely manage health-related needs after discharge will improve Outcome: Progressing

## 2023-11-24 NOTE — Progress Notes (Signed)
 Physical Therapy Treatment Patient Details Name: Allen Macdonald MRN: [id number] DOB: [birth year] Today's Date: 11/24/2023   History of Present Illness 73 y.o. male presents to [facility] hospital on 11/20/2023 with R weakness, L gaze and expressive aphasia. CTA with L ICA occlusion, pt underwent thrombectomy on 8/3. Pt with neuro change on 8/4, found to have L M1 occlusion and underwent subsequent thrombectomy and found to have L MCA dissection. PMH includes HTN, DVT/PE, bladder CA s/p nephrostomy..    PT Comments  Pt required min assist bed mobility, min assist sit to stand, and mod assist SPT with RW bed to recliner. R inattention. Expressive language deficits. R side weakness. Able to maintain grip on RW with R hand after initial assist with placement. Pt in recliner with feet elevated at end of session.     If plan is discharge home, recommend the following: A little help with walking and/or transfers;A lot of help with bathing/dressing/bathroom;Assistance with cooking/housework;Direct supervision/assist for medications management;Direct supervision/assist for financial management;Assist for transportation;Help with stairs or ramp for entrance;Supervision due to cognitive status   Can travel by private vehicle        Equipment Recommendations  Rolling walker (2 wheels);BSC/3in1    Recommendations for Other Services       Precautions / Restrictions Precautions Precautions: Fall;Other (comment) Recall of Precautions/Restrictions: Impaired Precaution/Restrictions Comments: bilat nephrostomy bags     Mobility  Bed Mobility Overal bed mobility: Needs Assistance Bed Mobility: Supine to Sit     Supine to sit: Min assist, Used rails, HOB elevated     General bed mobility comments: increased time,cues for sequencing    Transfers Overall transfer level: Needs assistance Equipment used: Rolling walker (2 wheels) Transfers: Sit to/from Stand, Bed to chair/wheelchair/BSC Sit to Stand: Min  assist   Step pivot transfers: Mod assist       General transfer comment: Assist to maintain balance and manage RW. Initial assist to place R hand but able to grip RW throughout transfer. Cues for sequencing and posture. No knee buckling noted. Pt with hesitant, guarded steps.    Ambulation/Gait                   Stairs             Wheelchair Mobility     Tilt Bed    Modified Rankin (Stroke Patients Only) Modified Rankin (Stroke Patients Only) Pre-Morbid Rankin Score: No symptoms Modified Rankin: Moderately severe disability     Balance Overall balance assessment: Needs assistance Sitting-balance support: Feet supported, No upper extremity supported Sitting balance-Leahy Scale: Good     Standing balance support: Bilateral upper extremity supported, During functional activity, Reliant on assistive device for balance Standing balance-Leahy Scale: Poor                              Communication Communication Communication: Impaired Factors Affecting Communication: Difficulty expressing self  Cognition Arousal: Alert Behavior During Therapy: Flat affect   PT - Cognitive impairments: Awareness, Attention, Problem solving, Sequencing                         Following commands: Impaired Following commands impaired: Follows multi-step commands inconsistently, Only follows one step commands consistently, Follows one step commands with increased time    Cueing Cueing Techniques: Verbal cues, Gestural cues, Tactile cues  Exercises      General Comments General comments (skin integrity, edema, etc.): VSS  on RA      Pertinent Vitals/Pain Pain Assessment Pain Assessment: No/denies pain    Home Living                          Prior Function            PT Goals (current goals can now be found in the care plan section) Acute Rehab PT Goals Patient Stated Goal: to return to independence Progress towards PT goals:  Progressing toward goals    Frequency    Min 3X/week      PT Plan      Co-evaluation              AM-PAC PT 6 Clicks Mobility   Outcome Measure  Help needed turning from your back to your side while in a flat bed without using bedrails?: A Little Help needed moving from lying on your back to sitting on the side of a flat bed without using bedrails?: A Little Help needed moving to and from a bed to a chair (including a wheelchair)?: A Lot Help needed standing up from a chair using your arms (e.g., wheelchair or bedside chair)?: A Little Help needed to walk in hospital room?: Total Help needed climbing 3-5 steps with a railing? : Total 6 Click Score: 13    End of Session Equipment Utilized During Treatment: Gait belt Activity Tolerance: Patient tolerated treatment well Patient left: in chair;with chair alarm set;with call bell/phone within reach;with family/visitor present Nurse Communication: Mobility status PT Visit Diagnosis: Other abnormalities of gait and mobility (R26.89);Muscle weakness (generalized) (M62.81)     Time: 1035-1101 PT Time Calculation (min) (ACUTE ONLY): 26 min  Charges:    $Therapeutic Activity: 23-37 mins PT General Charges $$ ACUTE PT VISIT: 1 Visit                     Sari MATSU., PT  Office # [phone]    Erven Sari Shaker 11/24/2023, 11:44 AM

## 2023-11-24 NOTE — Progress Notes (Addendum)
 STROKE TEAM PROGRESS NOTE   INTERIM HISTORY/SUBJECTIVE Allen Macdonald is at bedside.  No new neurological events overnight. Patient hgb now 9.7 after 2Units of blood 8/6. Plan is for tx to the floor and rehab 8/8  CBC    Component Value Date/Time   WBC 6.8 11/24/2023 0553   RBC 3.24 (L) 11/24/2023 0553   HGB 9.7 (L) 11/24/2023 0553   HGB 9.0 (L) 07/15/2023 1309   HGB 13.5 04/22/2017 1023   HCT 28.9 (L) 11/24/2023 0553   HCT 39.2 04/22/2017 1023   PLT 141 (L) 11/24/2023 0553   PLT 170 07/15/2023 1309   PLT 164 04/22/2017 1023   MCV 89.2 11/24/2023 0553   MCV 95 04/22/2017 1023   MCH 29.9 11/24/2023 0553   MCHC 33.6 11/24/2023 0553   RDW 19.3 (H) 11/24/2023 0553   RDW 14.0 04/22/2017 1023   LYMPHSABS 0.8 11/20/2023 0959   MONOABS 0.5 11/20/2023 0959   EOSABS 0.2 11/20/2023 0959   BASOSABS 0.0 11/20/2023 0959    BMET    Component Value Date/Time   NA 141 11/24/2023 0553   NA 139 07/05/2019 1045   K 3.6 11/24/2023 0553   CL 114 (H) 11/24/2023 0553   CO2 18 (L) 11/24/2023 0553   GLUCOSE 114 (H) 11/24/2023 0553   BUN 23 11/24/2023 0553   BUN 11 07/05/2019 1045   CREATININE 1.42 (H) 11/24/2023 0553   CALCIUM  7.9 (L) 11/24/2023 0553   GFRNONAA 52 (L) 11/24/2023 0553    IMAGING past 24 hours No results found.   Vitals:   11/24/23 0900 11/24/23 1000 11/24/23 1100 11/24/23 1200  BP: (!) 147/75 (!) 163/80 (!) 163/95 (!) 148/74  Pulse: 77 78 63 60  Resp: 19 (!) 25 (!) 22 (!) 24  Temp:      TempSrc:      SpO2: 94% 96% 94% 97%  Weight:      Height:         PHYSICAL EXAM General: Critically ill elderly male in no apparent distress  psych:  Mood and affect appropriate for situation CV: Regular rate and rhythm on monitor Respiratory:  Regular, unlabored respirations on room air GI: Abdomen soft and nontender   NEURO:  Mental Status: Awake and alert.  Still with moderate aphasia however is able to repeat and follow commands. speech is dysarthric and garbled  Cranial  Nerves:  II: PERRL. Visual fields with right hemianopia III, IV, VI: EOM with left gaze preference, can get to midline but cannot cross V: Sensation is intact to light touch and symmetrical to face.  VII: Right facial droop VIII: hearing intact to voice. IX, X: Palate elevates symmetrically. Phonation is normal.  KP:Dynloizm shrug 5/5. XII: tongue is midline without fasciculations. Motor: Right arm flaccid with minimal movement, right leg with drift, left arm and left leg antigravity and spontaneous movement Tone: is normal and bulk is normal Sensation-decreased on right arm, withdraws on right leg.  Has right neglect Coordination: FTN intact bilaterally, HKS: no ataxia in BLE.No drift.  Gait- deferred  Most Recent NIH  1a Level of Conscious.:  1b LOC Questions: 2 1c LOC Commands: 1 2 Best Gaze: 1 3 Visual: 1 4 Facial Palsy: 1 5a Motor Arm - left: 0 5b Motor Arm - Right: 4 6a Motor Leg - Left:  6b Motor Leg - Right: 2 7 Limb Ataxia: 0 8 Sensory: 1 9 Best Language: 2 10 Dysarthria: 2 11 Extinct. and Inatten.: 2 TOTAL: 30   ASSESSMENT/PLAN  Mr. Allen  Macdonald is a 73 y.o. male with history of HTN, DVT/PE on eliquis , bladder cancer s/p nephrostomy  admitted for right-sided weakness, left gaze and aphasia .  NIH on Admission 24  Stroke:  left MCA scattered infarcts with left tICA and M1 occlusion s/p IR with TICI2c Etiology: Likely due to hypercoagulable state in the setting of advanced cancer Code Stroke CT head No acute abnormality.   CTA head & neck 8/2 Positive for Left ICA terminus Occlusion with decreased L MCA flow S/p IR with left tICA and M1 occlusion with TICI2c MRI 8/3 Multiple scattered small foci of acute ischemia within the left MCA territory, greatest in the frontal lobe. Punctate foci of acute ischemia in the right occipital lobe and right frontal white matter. 2D Echo EF 50-55%, bubble study negative US  LE positive for age indeterminate DVTs: RT - PopV and  PTV LT - CFV, PFV, FV, PopV, Gastrocs  TCD bubble study no DVT LDL 112 HgbA1c 5.7 VTE prophylaxis - SCDs Eliquis  prior to admission, now on ASA 81. Will resume eliquis  tomorrow if anemia stable  Therapy recommendations:  CIR Disposition: Pending  Recurrent stroke: extension of left MCA scattered infarcts with left M1 re-occlusion s/p IR with TICI 2b due to dissection and hypotension. Acute neuro change with PT and sitting up, BP was low, received head of bed flat and IVF, BP improved but still has worsening left MCA syndrome Repeat CT head 8/3 - no acute process Repeat CTA head and neck with Left M1 branch occlusion at its origin  CTP core infarct 4cc, mismatch 43 cc Repeat IR with TICI2b Post IR repeat CTA showed interval recanalization of the M1 segment of the left MCA. Moderate stenosis distally in the recanalized M1 segment of the left MCA, which is also diffusely irregular. MRI brain repeat showed scattered left MCA infarcts, extended from prior MRA left MCA patent  Cangrelor  stopped 8/5 - on ASA 81. Will resume eliquis  tomorrow if anemia stable  History of DVT/PE Hx of multiple b/l DVT and PE Had IVC filter placed ~ 2014 Likely related to hypercoagulable state from advanced malignancy Home Meds: Eliquis  2.5 bid US  LE positive for age indeterminate DVTs: RT - PopV and PTV LT - CFV, PFV, FV, PopV, Gastrocs  Eliquis  on hold due to anemia, may resume tomorrow if anemia stable  Hx of hypertension Orthostatic hypotension Home meds: Amlodipine  5 mg, carvedilol  12.5 mg Orthostasis 8/4 am with low BP on sitting, improved with supine and IVF Off cleviprex  and Neo BP stable on the high end Resume amlodipine  5 mg Avoid low BP Long term BP goal normotensive  Hyperlipidemia Home meds: None LDL 112, goal < 70 Add atorvastatin  40  Continue statin at discharge  Bladder cancer s/p bilateral nephrostomy tubes AKI UTI Fever Poorly differentiated bladder cancer, now stage IV,  metastatic to the bone by MRI of the spine on 11/08/2023.  Cr 1.3 -1.29-1.44 -1.27--1.22  UA WBC > 50 On Rocephin  for 7 day course Fever Tmax 101.5--100.4--afebrile  Anemia Hgb 9.0-8.8-8.2 -7.8-6.7-PRBC 2U- 9.7 Likely due to hemodilution with IV fluid on top of anemia of chronic disease CBC monitoring May consider Eliquis  tomorrow if anemia continues to be stable  Dysphagia, improved Patient has post-stroke dysphagia Speech consulted Now passed swallow On diet, DC IV fluid  Other Stroke Risk Factors Obesity, Body mass index is 33.06 kg/m., BMI >/= 30 associated with increased stroke risk, recommend weight loss, diet and exercise as appropriate   Other acute medical issues Hx  of prostate cancer, on ADT H/o laryngeal cancer in 2021. S/p radiation therapy.   Harlene Pouch, MSN, NP-C [facility] See AMION or use Epic Chat  ATTENDING NOTE: I reviewed above note and agree with the assessment and plan. Pt was seen and examined.   Allen Macdonald at bedside.  Patient reclining in bed, aphasia and left-sided weakness continue to improve.  Hemoglobin improved to 9.7.  Plan for CIR tomorrow.  If hemoglobin continues to be stable, will switch aspirin  to Eliquis  tomorrow.  Continue PT and OT.  For detailed assessment and plan, please refer to above as I have made changes wherever appropriate.   Ary Cummins, MD PhD Stroke Neurology 11/24/2023 7:20 PM

## 2023-11-25 ENCOUNTER — Other Ambulatory Visit: Payer: Self-pay

## 2023-11-25 ENCOUNTER — Inpatient Hospital Stay (HOSPITAL_COMMUNITY)
Admission: AD | Admit: 2023-11-25 | Discharge: 2023-11-29 | DRG: 056 | Disposition: A | Discharge status: Other Acute Inpatient Hospital | Source: Intra-hospital | Attending: Physical Medicine & Rehabilitation | Admitting: Physical Medicine & Rehabilitation

## 2023-11-25 DIAGNOSIS — D500 Iron deficiency anemia secondary to blood loss (chronic): Secondary | ICD-10-CM | POA: Diagnosis present

## 2023-11-25 DIAGNOSIS — N390 Urinary tract infection, site not specified: Secondary | ICD-10-CM | POA: Diagnosis not present

## 2023-11-25 DIAGNOSIS — I10 Essential (primary) hypertension: Secondary | ICD-10-CM | POA: Diagnosis not present

## 2023-11-25 DIAGNOSIS — E785 Hyperlipidemia, unspecified: Secondary | ICD-10-CM | POA: Insufficient documentation

## 2023-11-25 DIAGNOSIS — I69398 Other sequelae of cerebral infarction: Secondary | ICD-10-CM | POA: Diagnosis not present

## 2023-11-25 DIAGNOSIS — C61 Malignant neoplasm of prostate: Secondary | ICD-10-CM | POA: Diagnosis present

## 2023-11-25 DIAGNOSIS — D649 Anemia, unspecified: Secondary | ICD-10-CM | POA: Insufficient documentation

## 2023-11-25 DIAGNOSIS — R1310 Dysphagia, unspecified: Secondary | ICD-10-CM | POA: Diagnosis not present

## 2023-11-25 DIAGNOSIS — R29705 NIHSS score 5: Secondary | ICD-10-CM | POA: Diagnosis not present

## 2023-11-25 DIAGNOSIS — I63442 Cerebral infarction due to embolism of left cerebellar artery: Secondary | ICD-10-CM | POA: Diagnosis not present

## 2023-11-25 DIAGNOSIS — I63512 Cerebral infarction due to unspecified occlusion or stenosis of left middle cerebral artery: Secondary | ICD-10-CM | POA: Diagnosis not present

## 2023-11-25 DIAGNOSIS — I6502 Occlusion and stenosis of left vertebral artery: Secondary | ICD-10-CM | POA: Diagnosis not present

## 2023-11-25 DIAGNOSIS — R5381 Other malaise: Secondary | ICD-10-CM | POA: Diagnosis present

## 2023-11-25 DIAGNOSIS — N179 Acute kidney failure, unspecified: Secondary | ICD-10-CM | POA: Diagnosis present

## 2023-11-25 DIAGNOSIS — Z79899 Other long term (current) drug therapy: Secondary | ICD-10-CM

## 2023-11-25 DIAGNOSIS — I6522 Occlusion and stenosis of left carotid artery: Secondary | ICD-10-CM | POA: Diagnosis present

## 2023-11-25 DIAGNOSIS — R29719 NIHSS score 19: Secondary | ICD-10-CM | POA: Diagnosis not present

## 2023-11-25 DIAGNOSIS — Z7901 Long term (current) use of anticoagulants: Secondary | ICD-10-CM | POA: Diagnosis not present

## 2023-11-25 DIAGNOSIS — Z8521 Personal history of malignant neoplasm of larynx: Secondary | ICD-10-CM

## 2023-11-25 DIAGNOSIS — I69351 Hemiplegia and hemiparesis following cerebral infarction affecting right dominant side: Secondary | ICD-10-CM | POA: Diagnosis not present

## 2023-11-25 DIAGNOSIS — R29710 NIHSS score 10: Secondary | ICD-10-CM | POA: Diagnosis not present

## 2023-11-25 DIAGNOSIS — E669 Obesity, unspecified: Secondary | ICD-10-CM | POA: Insufficient documentation

## 2023-11-25 DIAGNOSIS — Z8546 Personal history of malignant neoplasm of prostate: Secondary | ICD-10-CM

## 2023-11-25 DIAGNOSIS — I951 Orthostatic hypotension: Secondary | ICD-10-CM | POA: Insufficient documentation

## 2023-11-25 DIAGNOSIS — Z923 Personal history of irradiation: Secondary | ICD-10-CM

## 2023-11-25 DIAGNOSIS — R4701 Aphasia: Secondary | ICD-10-CM | POA: Diagnosis present

## 2023-11-25 DIAGNOSIS — I110 Hypertensive heart disease with heart failure: Secondary | ICD-10-CM | POA: Diagnosis not present

## 2023-11-25 DIAGNOSIS — I825Y3 Chronic embolism and thrombosis of unspecified deep veins of proximal lower extremity, bilateral: Secondary | ICD-10-CM

## 2023-11-25 DIAGNOSIS — I63132 Cerebral infarction due to embolism of left carotid artery: Secondary | ICD-10-CM | POA: Diagnosis not present

## 2023-11-25 DIAGNOSIS — I651 Occlusion and stenosis of basilar artery: Secondary | ICD-10-CM | POA: Diagnosis not present

## 2023-11-25 DIAGNOSIS — I639 Cerebral infarction, unspecified: Secondary | ICD-10-CM | POA: Diagnosis not present

## 2023-11-25 DIAGNOSIS — R29810 Facial weakness: Secondary | ICD-10-CM | POA: Diagnosis present

## 2023-11-25 DIAGNOSIS — D638 Anemia in other chronic diseases classified elsewhere: Secondary | ICD-10-CM | POA: Diagnosis not present

## 2023-11-25 DIAGNOSIS — I63312 Cerebral infarction due to thrombosis of left middle cerebral artery: Secondary | ICD-10-CM

## 2023-11-25 DIAGNOSIS — Z86718 Personal history of other venous thrombosis and embolism: Secondary | ICD-10-CM

## 2023-11-25 DIAGNOSIS — I69322 Dysarthria following cerebral infarction: Secondary | ICD-10-CM

## 2023-11-25 DIAGNOSIS — I6322 Cerebral infarction due to unspecified occlusion or stenosis of basilar arteries: Secondary | ICD-10-CM | POA: Diagnosis not present

## 2023-11-25 DIAGNOSIS — I63543 Cerebral infarction due to unspecified occlusion or stenosis of bilateral cerebellar arteries: Secondary | ICD-10-CM | POA: Diagnosis not present

## 2023-11-25 DIAGNOSIS — I69391 Dysphagia following cerebral infarction: Secondary | ICD-10-CM | POA: Diagnosis not present

## 2023-11-25 DIAGNOSIS — I420 Dilated cardiomyopathy: Secondary | ICD-10-CM | POA: Diagnosis not present

## 2023-11-25 DIAGNOSIS — D509 Iron deficiency anemia, unspecified: Secondary | ICD-10-CM | POA: Diagnosis present

## 2023-11-25 DIAGNOSIS — D62 Acute posthemorrhagic anemia: Secondary | ICD-10-CM | POA: Diagnosis not present

## 2023-11-25 DIAGNOSIS — Z8551 Personal history of malignant neoplasm of bladder: Secondary | ICD-10-CM

## 2023-11-25 DIAGNOSIS — C7951 Secondary malignant neoplasm of bone: Secondary | ICD-10-CM | POA: Diagnosis present

## 2023-11-25 DIAGNOSIS — R29898 Other symptoms and signs involving the musculoskeletal system: Secondary | ICD-10-CM | POA: Diagnosis not present

## 2023-11-25 DIAGNOSIS — E66811 Obesity, class 1: Secondary | ICD-10-CM

## 2023-11-25 DIAGNOSIS — I69320 Aphasia following cerebral infarction: Secondary | ICD-10-CM

## 2023-11-25 DIAGNOSIS — Z86711 Personal history of pulmonary embolism: Secondary | ICD-10-CM

## 2023-11-25 DIAGNOSIS — I672 Cerebral atherosclerosis: Secondary | ICD-10-CM | POA: Diagnosis not present

## 2023-11-25 DIAGNOSIS — Z6833 Body mass index (BMI) 33.0-33.9, adult: Secondary | ICD-10-CM

## 2023-11-25 DIAGNOSIS — R4781 Slurred speech: Secondary | ICD-10-CM | POA: Diagnosis not present

## 2023-11-25 DIAGNOSIS — C679 Malignant neoplasm of bladder, unspecified: Secondary | ICD-10-CM | POA: Diagnosis not present

## 2023-11-25 DIAGNOSIS — I63412 Cerebral infarction due to embolism of left middle cerebral artery: Secondary | ICD-10-CM | POA: Diagnosis not present

## 2023-11-25 DIAGNOSIS — D6869 Other thrombophilia: Secondary | ICD-10-CM | POA: Diagnosis not present

## 2023-11-25 DIAGNOSIS — I6782 Cerebral ischemia: Secondary | ICD-10-CM | POA: Diagnosis not present

## 2023-11-25 DIAGNOSIS — I509 Heart failure, unspecified: Secondary | ICD-10-CM | POA: Diagnosis not present

## 2023-11-25 DIAGNOSIS — Z8249 Family history of ischemic heart disease and other diseases of the circulatory system: Secondary | ICD-10-CM

## 2023-11-25 LAB — BASIC METABOLIC PANEL WITH GFR
Anion gap: 13 (ref 5–15)
BUN: 20 mg/dL (ref 8–23)
CO2: 21 mmol/L — ABNORMAL LOW (ref 22–32)
Calcium: 8.2 mg/dL — ABNORMAL LOW (ref 8.9–10.3)
Chloride: 113 mmol/L — ABNORMAL HIGH (ref 98–111)
Creatinine, Ser: 1.52 mg/dL — ABNORMAL HIGH (ref 0.61–1.24)
GFR, Estimated: 47 mL/min — ABNORMAL LOW
Glucose, Bld: 115 mg/dL — ABNORMAL HIGH (ref 70–99)
Potassium: 3.6 mmol/L (ref 3.5–5.1)
Sodium: 147 mmol/L — ABNORMAL HIGH (ref 135–145)

## 2023-11-25 LAB — GLUCOSE, CAPILLARY
Glucose-Capillary: 117 mg/dL — ABNORMAL HIGH (ref 70–99)
Glucose-Capillary: 148 mg/dL — ABNORMAL HIGH (ref 70–99)

## 2023-11-25 LAB — CBC
HCT: 30 % — ABNORMAL LOW (ref 39.0–52.0)
Hemoglobin: 9.9 g/dL — ABNORMAL LOW (ref 13.0–17.0)
MCH: 29.5 pg (ref 26.0–34.0)
MCHC: 33 g/dL (ref 30.0–36.0)
MCV: 89.3 fL (ref 80.0–100.0)
Platelets: 143 K/uL — ABNORMAL LOW (ref 150–400)
RBC: 3.36 MIL/uL — ABNORMAL LOW (ref 4.22–5.81)
RDW: 18.9 % — ABNORMAL HIGH (ref 11.5–15.5)
WBC: 6.3 K/uL (ref 4.0–10.5)
nRBC: 0 % (ref 0.0–0.2)

## 2023-11-25 MED ORDER — ADULT MULTIVITAMIN W/MINERALS CH: 1.0000 | ORAL_TABLET | Freq: Every day | ORAL | Status: DC

## 2023-11-25 MED ORDER — ATORVASTATIN CALCIUM 80 MG PO TABS
80.0000 mg | ORAL_TABLET | Freq: Every day | ORAL | Status: DC
  Administered 2023-11-26 – 2023-11-29 (×6): 80 mg via ORAL
  Filled 2023-11-25 (×4): qty 1

## 2023-11-25 MED ORDER — BISACODYL 10 MG RE SUPP: 10.0000 mg | Freq: Every day | RECTAL | Status: DC | PRN

## 2023-11-25 MED ORDER — DIPHENHYDRAMINE HCL 25 MG PO CAPS: 25.0000 mg | ORAL_CAPSULE | Freq: Four times a day (QID) | ORAL | Status: DC | PRN

## 2023-11-25 MED ORDER — AMLODIPINE BESYLATE 5 MG PO TABS
5.0000 mg | ORAL_TABLET | Freq: Every day | ORAL | Status: DC
  Administered 2023-11-26 – 2023-11-29 (×6): 5 mg via ORAL
  Filled 2023-11-25 (×4): qty 1

## 2023-11-25 MED ORDER — APIXABAN 5 MG PO TABS
5.0000 mg | ORAL_TABLET | Freq: Two times a day (BID) | ORAL | Status: DC
  Administered 2023-11-25 – 2023-11-29 (×11): 5 mg via ORAL
  Filled 2023-11-25 (×8): qty 1

## 2023-11-25 MED ORDER — PROCHLORPERAZINE 25 MG RE SUPP: 12.5000 mg | Freq: Four times a day (QID) | RECTAL | Status: DC | PRN

## 2023-11-25 MED ORDER — ATORVASTATIN CALCIUM 80 MG PO TABS: 80.0000 mg | ORAL_TABLET | Freq: Every day | ORAL | Status: DC

## 2023-11-25 MED ORDER — GUAIFENESIN-DM 100-10 MG/5ML PO SYRP: 5.0000 mL | ORAL_SOLUTION | Freq: Four times a day (QID) | ORAL | Status: DC | PRN

## 2023-11-25 MED ORDER — SODIUM CHLORIDE 0.9 % IV SOLN
1.0000 g | INTRAVENOUS | Status: AC
  Administered 2023-11-26 – 2023-11-27 (×2): 1 g via INTRAVENOUS
  Filled 2023-11-25 (×3): qty 10

## 2023-11-25 MED ORDER — ORAL CARE MOUTH RINSE: 15.0000 mL | OROMUCOSAL | Status: DC | PRN

## 2023-11-25 MED ORDER — ALUM & MAG HYDROXIDE-SIMETH 200-200-20 MG/5ML PO SUSP: 30.0000 mL | ORAL | Status: DC | PRN

## 2023-11-25 MED ORDER — PROCHLORPERAZINE MALEATE 5 MG PO TABS: 5.0000 mg | ORAL_TABLET | Freq: Four times a day (QID) | ORAL | Status: DC | PRN

## 2023-11-25 MED ORDER — CHLORHEXIDINE GLUCONATE CLOTH 2 % EX PADS
6.0000 | MEDICATED_PAD | Freq: Every day | CUTANEOUS | Status: DC
  Administered 2023-11-26 – 2023-11-28 (×4): 6 via TOPICAL

## 2023-11-25 MED ORDER — PROCHLORPERAZINE EDISYLATE 10 MG/2ML IJ SOLN: 5.0000 mg | Freq: Four times a day (QID) | INTRAMUSCULAR | Status: DC | PRN

## 2023-11-25 MED ORDER — SODIUM CHLORIDE 0.9 % IV SOLN: INTRAVENOUS | Status: DC

## 2023-11-25 MED ORDER — SENNOSIDES-DOCUSATE SODIUM 8.6-50 MG PO TABS: 1.0000 | ORAL_TABLET | Freq: Every evening | ORAL | Status: DC | PRN

## 2023-11-25 MED ORDER — APIXABAN 5 MG PO TABS: 5.0000 mg | ORAL_TABLET | Freq: Two times a day (BID) | ORAL | Status: DC

## 2023-11-25 MED ORDER — TRAZODONE HCL 50 MG PO TABS
25.0000 mg | ORAL_TABLET | Freq: Every evening | ORAL | Status: DC | PRN
  Administered 2023-11-28 (×2): 50 mg via ORAL
  Filled 2023-11-25: qty 1

## 2023-11-25 MED ORDER — ACETAMINOPHEN 325 MG PO TABS: 325.0000 mg | ORAL_TABLET | ORAL | Status: DC | PRN

## 2023-11-25 MED ORDER — SALINE SPRAY 0.65 % NA SOLN: 1.0000 | NASAL | Status: DC | PRN

## 2023-11-25 MED ORDER — ADULT MULTIVITAMIN W/MINERALS CH
1.0000 | ORAL_TABLET | Freq: Every day | ORAL | Status: DC
  Administered 2023-11-26 – 2023-11-29 (×6): 1 via ORAL
  Filled 2023-11-25 (×4): qty 1

## 2023-11-25 MED ORDER — FLEET ENEMA RE ENEM: 1.0000 | ENEMA | Freq: Once | RECTAL | Status: DC | PRN

## 2023-11-25 MED ORDER — ENSURE PLUS HIGH PROTEIN PO LIQD
237.0000 mL | Freq: Two times a day (BID) | ORAL | Status: DC
  Administered 2023-11-26 – 2023-11-29 (×8): 237 mL via ORAL

## 2023-11-25 MED ORDER — THIAMINE MONONITRATE 100 MG PO TABS
100.0000 mg | ORAL_TABLET | Freq: Every day | ORAL | Status: AC
  Administered 2023-11-26 – 2023-11-29 (×6): 100 mg via ORAL
  Filled 2023-11-25 (×4): qty 1

## 2023-11-25 MED ORDER — ENSURE PLUS HIGH PROTEIN PO LIQD: 237.0000 mL | Freq: Two times a day (BID) | ORAL | Status: DC

## 2023-11-25 MED ORDER — VITAMIN B-1 100 MG PO TABS: 100.0000 mg | ORAL_TABLET | Freq: Every day | ORAL | Status: DC

## 2023-11-25 NOTE — Discharge Summary (Addendum)
 Stroke Discharge Summary  Patient ID: Kashius Dominic   MRN: [id number]      DOB: 15-Jul-1950  Date of Admission: 11/20/2023 Date of Discharge: 11/25/2023  Attending Physician:  Stroke, Md, MD Consultant(s):    NeuroInterventional   Patient's PCP:  Ina Marcellus RAMAN, MD  DISCHARGE PRIMARY DIAGNOSIS:    Stroke:  left MCA scattered infarcts with left tICA and M1 occlusion s/p IR with TICI2c Etiology: Likely due to hypercoagulable state in the setting of advanced cancer & Recurrent stroke: extension of left MCA scattered infarcts with left M1 re-occlusion s/p IR with TICI 2b due to dissection and hypotension.   Secondary diagnosis Hx of DVT/PE HTN Orthostatic hypotension HLD Bladder cancer s/p b/l nephrostomy AKI UTI Fever  Anemia s/p PRBC Dysphagia Hx of prostate cancer Hx of laryngeal cancer   Patient Active Problem List   Diagnosis Date Noted   Orthostatic hypotension 11/25/2023   Hyperlipidemia 11/25/2023   AKI (acute kidney injury) (HCC) 11/25/2023   UTI (urinary tract infection) 11/25/2023   Fever in newborn 11/25/2023   Obesity 11/25/2023   Dysphagia 11/25/2023   Anemia 11/25/2023   Acute ischemic left MCA stroke (HCC) 11/20/2023   Iron deficiency anemia due to chronic blood loss 08/03/2023   Acquired hallux rigidus, left 11/20/2020   Plantar fasciitis 11/20/2020   Strain of left foot 11/20/2020   Laryngeal cancer (HCC) squamous cell 02/25/2020   History of pulmonary embolus (PE)    PC (prostate cancer) (HCC) 08/28/2019   Abnormal stress test minimal ischemia involving apex 05/01/2019   History of DVT (deep vein thrombosis) 04/22/2017   Left knee injury 12/29/2015   Dilated cardiomyopathy (HCC) 12/18/2015   Essential hypertension 12/18/2015   History of pulmonary embolism 12/18/2015     Allergies as of 11/25/2023       Reactions   Bee Venom Anaphylaxis        Medication List     STOP taking these medications    bicalutamide  50 MG tablet Commonly  known as: CASODEX    carvedilol  12.5 MG tablet Commonly known as: COREG    denosumab  60 MG/ML Sosy injection Commonly known as: PROLIA    famotidine  20 MG tablet Commonly known as: PEPCID    loratadine  10 MG tablet Commonly known as: CLARITIN    omeprazole  40 MG capsule Commonly known as: PRILOSEC       TAKE these medications    amLODipine  5 MG tablet Commonly known as: NORVASC  Take 5 mg by mouth daily.   apixaban  5 MG Tabs tablet Commonly known as: ELIQUIS  Take 1 tablet (5 mg total) by mouth 2 (two) times daily. What changed:  medication strength how much to take   atorvastatin  80 MG tablet Commonly known as: LIPITOR  Take 1 tablet (80 mg total) by mouth daily. Start taking on: November 26, 2023   feeding supplement Liqd Take 237 mLs by mouth 2 (two) times daily between meals.   multivitamin with minerals Tabs tablet Take 1 tablet by mouth daily. Start taking on: November 26, 2023   thiamine  100 MG tablet Commonly known as: Vitamin B-1 Take 1 tablet (100 mg total) by mouth daily. Start taking on: November 26, 2023        LABORATORY STUDIES CBC    Component Value Date/Time   WBC 6.3 11/25/2023 0415   RBC 3.36 (L) 11/25/2023 0415   HGB 9.9 (L) 11/25/2023 0415   HGB 9.0 (L) 07/15/2023 1309   HGB 13.5 04/22/2017 1023   HCT 30.0 (L) 11/25/2023  0415   HCT 39.2 04/22/2017 1023   PLT 143 (L) 11/25/2023 0415   PLT 170 07/15/2023 1309   PLT 164 04/22/2017 1023   MCV 89.3 11/25/2023 0415   MCV 95 04/22/2017 1023   MCH 29.5 11/25/2023 0415   MCHC 33.0 11/25/2023 0415   RDW 18.9 (H) 11/25/2023 0415   RDW 14.0 04/22/2017 1023   LYMPHSABS 0.8 11/20/2023 0959   MONOABS 0.5 11/20/2023 0959   EOSABS 0.2 11/20/2023 0959   BASOSABS 0.0 11/20/2023 0959   CMP    Component Value Date/Time   NA 147 (H) 11/25/2023 0415   NA 139 07/05/2019 1045   K 3.6 11/25/2023 0415   CL 113 (H) 11/25/2023 0415   CO2 21 (L) 11/25/2023 0415   GLUCOSE 115 (H) 11/25/2023 0415   BUN 20  11/25/2023 0415   BUN 11 07/05/2019 1045   CREATININE 1.52 (H) 11/25/2023 0415   CALCIUM  8.2 (L) 11/25/2023 0415   PROT 5.5 (L) 11/21/2023 1430   ALBUMIN  2.5 (L) 11/21/2023 1430   AST 23 11/21/2023 1430   ALT 27 11/21/2023 1430   ALKPHOS 80 11/21/2023 1430   BILITOT 0.4 11/21/2023 1430   GFRNONAA 47 (L) 11/25/2023 0415   GFRAA 91 07/05/2019 1045   COAGS Lab Results  Component Value Date   INR 1.2 11/20/2023   Lipid Panel    Component Value Date/Time   CHOL 170 11/21/2023 0648   TRIG 101 11/21/2023 0648   HDL 38 (L) 11/21/2023 0648   CHOLHDL 4.5 11/21/2023 0648   VLDL 20 11/21/2023 0648   LDLCALC 112 (H) 11/21/2023 0648   HgbA1C  Lab Results  Component Value Date   HGBA1C 5.7 (H) 11/20/2023   Alcohol Level    Component Value Date/Time   [facility] <15 11/20/2023 0959     SIGNIFICANT DIAGNOSTIC STUDIES VAS US  TRANSCRANIAL DOPPLER W BUBBLES Result Date: 11/22/2023  Transcranial Doppler with Bubble Patient Name:  DAVIDJAMES BLANSETT  Date of Exam:   11/22/2023 Medical Rec #: [id number]        Accession #:    [id number] Date of Birth: 1950/07/05        Patient Gender: M Patient Age:   73 years Exam Location:  [facility] Procedure:      VAS US  TRANSCRANIAL DOPPLER W BUBBLES Referring Phys: ARY Genesee Nase --------------------------------------------------------------------------------  Indications: Embolic stroke, positive for DVT. History: Bladder cancer, on Eliquis . Comparison Study: No prior TCD Performing Technologist: Alberta Lis RVS  Examination Guidelines: A complete evaluation includes B-mode imaging, spectral Doppler, color Doppler, and power Doppler as needed of all accessible portions of each vessel. Bilateral testing is considered an integral part of a complete examination. Limited examinations for reoccurring indications may be performed as noted.  Summary: No HITS at rest or during Valsalva. Negative transcranial Doppler Bubble study with no evidence of right to left  intracardiac communication.  A vascular evaluation was performed. The right middle cerebral artery was studied. An IV was inserted into the patient's left AC. Verbal informed consent was obtained.  Diagnosing physician: ARY Cummins MD Electronically signed by ARY Cummins MD on 11/22/2023 at 2:42:49 PM.    Final    ECHOCARDIOGRAM COMPLETE Result Date: 11/22/2023    ECHOCARDIOGRAM REPORT   Patient Name:   SADRAC ZEOLI Date of Exam: 11/22/2023 Medical Rec #:  [id number]       Height:       70.0 in Accession #:    [id number]      Weight:  230.4 lb Date of Birth:  [birth year]       BSA:          2.217 m Patient Age:    73 years        BP:           138/65 mmHg Patient Gender: M               HR:           75 bpm. Exam Location:  Inpatient Procedure: 2D Echo, 3D Echo, Cardiac Doppler, Color Doppler and Strain Analysis            (Both Spectral and Color Flow Doppler were utilized during            procedure). Indications:    Stroke  History:        Patient has prior history of Echocardiogram examinations, most                 recent 02/11/2023. Cardiomyopathy, Signs/Symptoms:Dyspnea; Risk                 Factors:Hypertension. History of pulmonary embolism.  Sonographer:    Philomena Daring Referring Phys: [id number] ROCKY BROCKS LEHNER IMPRESSIONS  1. Posterior lateral wall hypokinesis . Left ventricular ejection fraction, by estimation, is 50 to 55%. The left ventricle has low normal function. The left ventricle has no regional wall motion abnormalities. Left ventricular diastolic parameters were  normal.  2. Right ventricular systolic function is normal. The right ventricular size is normal.  3. Left atrial size was mildly dilated.  4. The mitral valve is abnormal. Moderate mitral valve regurgitation. No evidence of mitral stenosis. Moderate mitral annular calcification.  5. The aortic valve is tricuspid. There is mild calcification of the aortic valve. There is mild thickening of the aortic valve. Aortic valve regurgitation is  not visualized. Aortic valve sclerosis is present, with no evidence of aortic valve stenosis.  6. The inferior vena cava is normal in size with greater than 50% respiratory variability, suggesting right atrial pressure of 3 mmHg.  7. Agitated saline contrast bubble study was negative, with no evidence of any interatrial shunt. FINDINGS  Left Ventricle: Posterior lateral wall hypokinesis. Left ventricular ejection fraction, by estimation, is 50 to 55%. The left ventricle has low normal function. The left ventricle has no regional wall motion abnormalities. Strain was performed and the global longitudinal strain is indeterminate. The left ventricular internal cavity size was normal in size. There is no left ventricular hypertrophy. Left ventricular diastolic parameters were normal. Right Ventricle: The right ventricular size is normal. No increase in right ventricular wall thickness. Right ventricular systolic function is normal. Left Atrium: Left atrial size was mildly dilated. Right Atrium: Right atrial size was normal in size. Pericardium: There is no evidence of pericardial effusion. Mitral Valve: The mitral valve is abnormal. There is mild thickening of the mitral valve leaflet(s). There is mild calcification of the mitral valve leaflet(s). Moderate mitral annular calcification. Moderate mitral valve regurgitation. No evidence of mitral valve stenosis. Tricuspid Valve: The tricuspid valve is normal in structure. Tricuspid valve regurgitation is not demonstrated. No evidence of tricuspid stenosis. Aortic Valve: The aortic valve is tricuspid. There is mild calcification of the aortic valve. There is mild thickening of the aortic valve. Aortic valve regurgitation is not visualized. Aortic valve sclerosis is present, with no evidence of aortic valve stenosis. Pulmonic Valve: The pulmonic valve was normal in structure. Pulmonic valve regurgitation is not visualized. No evidence  of pulmonic stenosis. Aorta: The aortic  root is normal in size and structure. Venous: The inferior vena cava is normal in size with greater than 50% respiratory variability, suggesting right atrial pressure of 3 mmHg. IAS/Shunts: No atrial level shunt detected by color flow Doppler. Agitated saline contrast was given intravenously to evaluate for intracardiac shunting. Agitated saline contrast bubble study was negative, with no evidence of any interatrial shunt. Additional Comments: 3D was performed not requiring image post processing on an independent workstation and was indeterminate.  LEFT VENTRICLE PLAX 2D LVIDd:         5.70 cm      Diastology LVIDs:         3.90 cm      LV e' medial:    8.59 cm/s LV PW:         1.10 cm      LV E/e' medial:  9.9 LV IVS:        1.10 cm      LV e' lateral:   12.00 cm/s LVOT diam:     1.70 cm      LV E/e' lateral: 7.1 LV SV:         42 LV SV Index:   19 LVOT Area:     2.27 cm  LV Volumes (MOD) LV vol d, MOD A2C: 113.0 ml LV vol d, MOD A4C: 144.0 ml LV vol s, MOD A2C: 43.8 ml LV vol s, MOD A4C: 51.4 ml LV SV MOD A2C:     69.2 ml LV SV MOD A4C:     144.0 ml LV SV MOD BP:      79.8 ml RIGHT VENTRICLE             IVC RV S prime:     14.50 cm/s  IVC diam: 1.20 cm TAPSE (M-mode): 3.3 cm LEFT ATRIUM             Index        RIGHT ATRIUM           Index LA diam:        3.80 cm 1.71 cm/m   RA Area:     14.20 cm LA Vol (A2C):   66.3 ml 29.91 ml/m  RA Volume:   29.20 ml  13.17 ml/m LA Vol (A4C):   45.9 ml 20.71 ml/m LA Biplane Vol: 57.3 ml 25.85 ml/m  AORTIC VALVE LVOT Vmax:   81.80 cm/s LVOT Vmean:  56.600 cm/s LVOT VTI:    0.185 m  AORTA Ao Root diam: 2.60 cm MITRAL VALVE MV Area (PHT): 3.85 cm    SHUNTS MV Decel Time: 197 msec    Systemic VTI:  0.18 m MV E velocity: 85.40 cm/s  Systemic Diam: 1.70 cm MV A velocity: 90.10 cm/s MV E/A ratio:  0.95 Maude Emmer MD Electronically signed by Maude Emmer MD Signature Date/Time: 11/22/2023/9:27:45 AM    Final    VAS US  LOWER EXTREMITY VENOUS (DVT) Result Date: 11/22/2023   Lower Venous DVT Study Patient Name:  TWAIN STENSETH  Date of Exam:   11/21/2023 Medical Rec #: [id number]        Accession #:    [id number] Date of Birth: 1950/04/26        Patient Gender: M Patient Age:   89 years Exam Location:  [facility] Procedure:      VAS US  LOWER EXTREMITY VENOUS (DVT) Referring Phys: ARY Seydou Hearns --------------------------------------------------------------------------------  Indications: Stroke.  Risk Factors: Cancer (bladder). Anticoagulation: Eliquis . Limitations:  Bandages, poor ultrasound/tissue interface and patient immobility. Comparison Study: Previous RLEV on 09/07/2023 was positive for DVT (CFV, FV, PFV,                   PopV, PTV) Previous BLEV 06/27/2023 was negative for DVT,                   however exam was limited. Performing Technologist: Ezzie Potters RVT, RDMS  Examination Guidelines: A complete evaluation includes B-mode imaging, spectral Doppler, color Doppler, and power Doppler as needed of all accessible portions of each vessel. Bilateral testing is considered an integral part of a complete examination. Limited examinations for reoccurring indications may be performed as noted. The reflux portion of the exam is performed with the patient in reverse Trendelenburg.  +---------+---------------+---------+-----------+----------+-------------------+ RIGHT    CompressibilityPhasicitySpontaneityPropertiesThrombus Aging      +---------+---------------+---------+-----------+----------+-------------------+ CFV                                                   Unable to visualize +---------+---------------+---------+-----------+----------+-------------------+ SFJ                                                   Unable to visualize +---------+---------------+---------+-----------+----------+-------------------+ FV Prox  Full           Yes      Yes                                       +---------+---------------+---------+-----------+----------+-------------------+ FV Mid   Full           Yes      Yes                                      +---------+---------------+---------+-----------+----------+-------------------+ FV DistalFull           Yes      Yes                                      +---------+---------------+---------+-----------+----------+-------------------+ PFV      Full                                                             +---------+---------------+---------+-----------+----------+-------------------+ POP      None           No       No                   Age Indeterminate   +---------+---------------+---------+-----------+----------+-------------------+ PTV      None           No       No                   Age Indeterminate   +---------+---------------+---------+-----------+----------+-------------------+  PERO     Full                                                             +---------+---------------+---------+-----------+----------+-------------------+   Right Technical Findings: Not visualized segments include CFV, SFJ - pressure dressing from thrombectomy. Popliteal vein not well visualized due to immobility  +---------+---------------+---------+-----------+----------+-----------------+ LEFT     CompressibilityPhasicitySpontaneityPropertiesThrombus Aging    +---------+---------------+---------+-----------+----------+-----------------+ CFV      None           No       No                   Age Indeterminate +---------+---------------+---------+-----------+----------+-----------------+ SFJ      Partial                                                        +---------+---------------+---------+-----------+----------+-----------------+ FV Prox  None           No       Yes                  Age Indeterminate +---------+---------------+---------+-----------+----------+-----------------+ FV Mid   None            No       No                   Age Indeterminate +---------+---------------+---------+-----------+----------+-----------------+ FV DistalNone           No       No                   Age Indeterminate +---------+---------------+---------+-----------+----------+-----------------+ PFV      Partial        No       Yes                  Age Indeterminate +---------+---------------+---------+-----------+----------+-----------------+ POP      None           No       No                   Age Indeterminate +---------+---------------+---------+-----------+----------+-----------------+ PTV      Full                                                           +---------+---------------+---------+-----------+----------+-----------------+ PERO     Full                                                           +---------+---------------+---------+-----------+----------+-----------------+ Gastroc  None           No       No                   Age  Indeterminate +---------+---------------+---------+-----------+----------+-----------------+ EIV      Full           Yes      Yes                                    +---------+---------------+---------+-----------+----------+-----------------+     Summary: RIGHT: - Findings consistent with age indeterminate deep vein thrombosis involving the right posterior tibial veins, and right popliteal vein.   LEFT: - Findings consistent with age indeterminate deep vein thrombosis involving the left common femoral vein, SF junction, left femoral vein, left proximal profunda vein, and left popliteal vein. Findings consistent with age indeterminate intramuscular thrombus involving the left gastrocnemius veins.  *See table(s) above for measurements and observations. Electronically signed by Norman Serve on 11/22/2023 at 9:20:21 AM.    Final    MR ANGIO HEAD WO CONTRAST Result Date: 11/22/2023 EXAM: MR Angiography Head without intravenous Contrast.  11/22/2023 05:40:30 AM TECHNIQUE: Magnetic resonance angiography images of the head without intravenous contrast. Multiplanar 2D and 3D reformatted images are provided for review. COMPARISON: CT angio head and neck 11/21/2023. Cerebral arteriogram 11/21/2023. CLINICAL HISTORY: Stroke, follow up. FINDINGS: ANTERIOR CIRCULATION: A left A1 segment is dominant. The anterior communicating artery is patent. The left MCA branch vessels are within normal limits on this study. No aneurysm. POSTERIOR CIRCULATION: A left vertebral artery is a dominant vessel. No aneurysm. IMPRESSION: 1. No significant stenosis of the intracranial vasculature. 2. No aneurysm. Electronically signed by: Lonni Necessary MD 11/22/2023 06:02 AM EDT RP Workstation: [id number]   MR BRAIN WO CONTRAST Result Date: 11/22/2023 EXAM: MRI BRAIN WITHOUT CONTRAST 11/22/2023 05:40:00 AM TECHNIQUE: Multiplanar multisequence MRI of the head/brain was performed without the administration of intravenous contrast. COMPARISON: None available. CLINICAL HISTORY: Stroke, follow up. Left MCA dissection. FINDINGS: BRAIN AND VENTRICLES: Scattered foci of restricted diffusion are present throughout the left MCA territory consistent with cortical and subcortical infarcts. Partial infarcts of the left caudate head and lentiform nuclei are present. T2 and FLAIR signal changes are consistent with the subacute timeframe of the infarcts. Susceptibility weighted images demonstrate signal change within the large cortical lesions and in the left caudate head lesion consistent with petechial hemorrhage. Cortical thickening is present without other significant mass effect. Periventricular and subcortical T2 hyperintensities are otherwise mildly advanced for age. The sella is unremarkable. Normal flow voids. ORBITS: Bilateral lens replacements are noted. The globes and orbits are otherwise within normal limits. SINUSES AND MASTOIDS: Minimal fluid is present in the right  maxillary sinus. Small bilateral mastoid effusions are present. BONES AND SOFT TISSUES: Normal marrow signal. No acute soft tissue abnormality. IMPRESSION: 1. Scattered foci of restricted diffusion throughout the left MCA territory consistent with cortical and subcortical infarcts, including partial infarcts of the left caudate head and lentiform nuclei. Findings are consistent with the subacute timeframe. 2. Petechial hemorrhage noted within the largest cortical lesions and the left caudate head. 3. Cortical thickening without other significant mass effect. 4. Mildly advanced periventricular and subcortical T2 hyperintensities for age. 5. Minimal fluid in the right maxillary sinus and small bilateral mastoid effusions. Electronically signed by: Lonni Necessary MD 11/22/2023 05:54 AM EDT RP Workstation: [id number]   IR PERCUTANEOUS ART THROMBECTOMY/INFUSION INTRACRANIAL INC DIAG ANGIO Result Date: 11/21/2023 INDICATION: Acute Large Vessel occlusion, left middle cerebral artery occlusion EXAM: Mechanical thrombectomy, CONSENT: Emergency consent was obtained from the patient's wife as the patient was unable to consent for themselves  and family not immediately available. Dr. Ary Cummins and I agreed the procedure was emergent. MEDICATIONS: Intravenous Cangrelor , intra arterial verapamil  5 mg ANESTHESIA/SEDATION: General CONTRAST:  Forty ml Isovue 370 FLUOROSCOPY: Fluoroscopy time: 3 minutes 54 seconds Radiation Exposure Index (as provided by the fluoroscopic device): 72.05 MGy reference air Kerma COMPLICATIONS: None immediate. TECHNIQUE: Maximal Sterile Barrier Technique was utilized including caps, mask, sterile gowns, sterile gloves, sterile drape, hand hygiene and skin antiseptic. A timeout was performed prior to the initiation of the procedure. Vessel catheterized: Left common carotid, left internal carotid FINDINGS: The carotid bifurcation is normal. There is an occlusion of the left middle cerebral artery.  The anterior cerebral artery is normal. Good collaterals. PROCEDURE: Femoral access was obtained with ultrasound using direct visualization of the needle puncture into the artery. A 90 cm neuron max sheath was placed. The non MAC she was advanced over a 5 [place] penumbra select parents teen into the left internal carotid artery. The read 72 SENDIT catheter was advanced over a synchro support wire to the level of the occlusion and connected to suction. The catheter was then withdrawn under aspiration. This resulted in removal of the embolus. The follow-up arteriogram demonstrated a intimal flap within the left middle cerebral artery M1 segment. 5 mg of verapamil  were administered intra-arterially. A repeat image was obtained which clearly demonstrates a dissection flap in the distal middle cerebral artery M1 segment. It appears to extend to both the inferior and superior divisions of the middle cerebral artery. The artery was observed for about 20 minutes after initiation of Cangrelor . There was no slowing of the flow. All of the proximal branches were patent, there was slow flow distally within several of the branches involving both the anterior and posterior middle cerebral artery divisions. I did not think the stent would likely improve the situation and would risk thrombosis of the artery and potentially 1 of the 2 divisions. Hemostasis was obtained with Angio-Seal. 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 Flow was restored with aspiration thrombectomy. Initiation Cangrelor  in attempt to maintain vessel patency PLAN: Return to ICU. Continue intravenous Cangrelor  24 hours until repeat imaging Electronically Signed   By: Nancyann Burns M.D.   On: 11/21/2023 15:02   IR US  Guide Vasc Access Right Result Date: 11/21/2023 INDICATION: Acute Large Vessel occlusion, left middle cerebral artery occlusion EXAM: Mechanical thrombectomy, CONSENT: Emergency  consent was obtained from the patient's wife as the patient was unable to consent for themselves and family not immediately available. Dr. Ary Cummins and I agreed the procedure was emergent. MEDICATIONS: Intravenous Cangrelor , intra arterial verapamil  5 mg ANESTHESIA/SEDATION: General CONTRAST:  Forty ml Isovue 370 FLUOROSCOPY: Fluoroscopy time: 3 minutes 54 seconds Radiation Exposure Index (as provided by the fluoroscopic device): 72.05 MGy reference air Kerma COMPLICATIONS: None immediate. TECHNIQUE: Maximal Sterile Barrier Technique was utilized including caps, mask, sterile gowns, sterile gloves, sterile drape, hand hygiene and skin antiseptic. A timeout was performed prior to the initiation of the procedure. Vessel catheterized: Left common carotid, left internal carotid FINDINGS: The carotid bifurcation is normal. There is an occlusion of the left middle cerebral artery. The anterior cerebral artery is normal. Good collaterals. PROCEDURE: Femoral access was obtained with ultrasound using direct visualization of the needle puncture into the artery. A 90 cm neuron max sheath was placed. The non MAC she was advanced over a 5 [place] penumbra select parents teen into the left internal carotid artery. The read 72  SENDIT catheter was advanced over a synchro support wire to the level of the occlusion and connected to suction. The catheter was then withdrawn under aspiration. This resulted in removal of the embolus. The follow-up arteriogram demonstrated a intimal flap within the left middle cerebral artery M1 segment. 5 mg of verapamil  were administered intra-arterially. A repeat image was obtained which clearly demonstrates a dissection flap in the distal middle cerebral artery M1 segment. It appears to extend to both the inferior and superior divisions of the middle cerebral artery. The artery was observed for about 20 minutes after initiation of Cangrelor . There was no slowing of the flow. All of the proximal  branches were patent, there was slow flow distally within several of the branches involving both the anterior and posterior middle cerebral artery divisions. I did not think the stent would likely improve the situation and would risk thrombosis of the artery and potentially 1 of the 2 divisions. Hemostasis was obtained with Angio-Seal. 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 Flow was restored with aspiration thrombectomy. Initiation Cangrelor  in attempt to maintain vessel patency PLAN: Return to ICU. Continue intravenous Cangrelor  24 hours until repeat imaging Electronically Signed   By: Nancyann Burns M.D.   On: 11/21/2023 15:02   CT ANGIO HEAD NECK W WO CM Result Date: 11/21/2023 EXAM: CTA HEAD AND NECK WITH AND WITHOUT 11/21/2023 01:49:04 PM TECHNIQUE: CTA of the head and neck was performed with and without the administration of intravenous contrast. Multiplanar 2D and/or 3D reformatted images are provided for review. Automated exposure control, iterative reconstruction, and/or weight based adjustment of the mA/kV was utilized to reduce the radiation dose to as low as reasonably achievable. Stenosis of the internal carotid arteries measured using NASCET criteria. COMPARISON: CT angiogram of the head and neck and CT perfusion of the head dated 11/20/2000. CLINICAL HISTORY: Stroke/TIA, determine embolic source. FINDINGS: CTA NECK: AORTIC ARCH AND ARCH VESSELS: Minimal calcific atheromatous disease within the aortic arch. No dissection or arterial injury. No significant stenosis of the brachiocephalic or subclavian arteries. CERVICAL CAROTID ARTERIES: Mild calcific/noncalcific plaque within the origin of the right internal carotid artery, with no appreciable stenosis. Mild calcific plaque within the origin of the left internal carotid artery, also with no significant stenosis. No dissection or arterial injury. CERVICAL VERTEBRAL ARTERIES: The left  vertebral artery is dominant. Both vertebral arteries are normal in caliber throughout their respective courses. No dissection, arterial injury, or significant stenosis. LUNGS AND MEDIASTINUM: Unremarkable. SOFT TISSUES: No acute abnormality. BONES: No acute abnormality. CTA HEAD: ANTERIOR CIRCULATION: Mild generalized cerebral volume loss and mild periventricular white matter disease. Calcification within the carotid siphons bilaterally and mild diffuse stenosis of the clinoid and cavernous segments of the right internal carotid artery. The endoluminal filling defect previously seen at the left ICA terminus is no longer present. The M1 segment of the left middle cerebral artery has recanalized in the interim and demonstrates moderate stenosis distally. It is also diffusely irregular. The M2 branches are patent. The right A1 segment is diminutive and diffusely irregular. No aneurysm. POSTERIOR CIRCULATION: No significant stenosis of the posterior cerebral arteries. No significant stenosis of the basilar artery. No significant stenosis of the vertebral arteries. No aneurysm. OTHER: No dural venous sinus thrombosis on this non-dedicated study. IMPRESSION: 1. No large vessel occlusion or aneurysm in the head or neck. There has been interval recanalization of the M1 segment of the left middle cerebral artery. 2. Moderate  stenosis distally in the recanalized M1 segment of the left middle cerebral artery, which is also diffusely irregular. Electronically signed by: evalene coho 11/21/2023 02:39 PM EDT RP Workstation: [id number]   CT ANGIO HEAD NECK W WO CM W PERF Result Date: 11/21/2023 EXAM: CTA Head and Neck with Perfusion 11/21/2023 10:14:48 AM TECHNIQUE: CTA of the head and neck was performed with the administration of intravenous contrast. 3D postprocessing with multiplanar reconstructions and MIPs was performed to evaluate the vascular anatomy. Automated exposure control, iterative reconstruction, and/or  weight based adjustment of the mA/kV was utilized to reduce the radiation dose to as low as reasonably achievable. COMPARISON: CT angiogram of the head and neck dated 11/20/2023. CLINICAL HISTORY: Post thrombectomy worsening aphasia and right arm weakness. FINDINGS: CTA NECK: AORTIC ARCH AND ARCH VESSELS: No dissection or arterial injury. No significant stenosis of the brachiocephalic or subclavian arteries. CERVICAL CAROTID ARTERIES: Mild calcific plaque present within the origin of the left internal carotid artery, with no luminal stenosis. CERVICAL VERTEBRAL ARTERIES: No dissection, arterial injury, or significant stenosis. LUNGS AND MEDIASTINUM: Unremarkable. SOFT TISSUES: No acute abnormality. BONES: No acute abnormality. CTA HEAD: ANTERIOR CIRCULATION: Calcific plaque present within the carotid siphons bilaterally. There is an endoluminal filling defect within the left ICA terminus. The left M1 branch is completely occluded at its origin. There is reconstitution of the M2 branches via collaterals. The left A1 is widely patent. POSTERIOR CIRCULATION: No significant stenosis of the posterior cerebral arteries. No significant stenosis of the basilar artery. No significant stenosis of the vertebral arteries. No aneurysm. OTHER: No dural venous sinus thrombosis on this non-dedicated study. CT PERFUSION: EXAM QUALITY: Exam quality is adequate with diagnostic perfusion maps. No significant motion artifact. Appropriate arterial inflow and venous outflow curves. CORE INFARCT (CBF<30% volume): 4 mL TOTAL HYPOPERFUSION (Tmax>6s volume): 47 mL PENUMBRA: Mismatch volume: 43 mL Mismatch ratio: 11.8 Location: The infarct core is within the left temporal lobe and the penumbra involves the temporal, frontal and parietal lobes. IMPRESSION: 1. Left M1 branch occlusion at its origin with reconstitution of the M2 branches via collaterals. 2. Infarct core within the left temporal lobe and penumbra involving the temporal, frontal,  and parietal lobes. 3. The above findings were communicated to ordering clinician Karna Geralds via [employer] paging at 10:51 AM 11/21/2023. Electronically signed by: evalene coho 11/21/2023 10:53 AM EDT RP Workstation: [id number]   CT HEAD WO CONTRAST ( ) Result Date: 11/21/2023 EXAM: CT HEAD WITHOUT 11/21/2023 10:14:48 AM TECHNIQUE: CT of the head was performed without the administration of intravenous contrast. Automated exposure control, iterative reconstruction, and/or weight based adjustment of the mA/kV was utilized to reduce the radiation dose to as low as reasonably achievable. COMPARISON: MRI of the head dated 11/21/2023. CLINICAL HISTORY: Stroke, follow up; worsening exam with aphasia and right arm weakness. FINDINGS: BRAIN AND VENTRICLES: No mass effect or midline shift. No extra-axial fluid collection. Gray-white differentiation is maintained. No hydrocephalus. Age-related cerebral volume loss present. Mild-to-moderate periventricular and subcortical white matter disease. Focal area of increased density within the cortex of the left mid frontal lobe seen on image 20 of series 3, which may represent contrast staining from earlier CT angiogram. The brain is otherwise unremarkable. ORBITS: Patient is status post bilateral lens replacement. SINUSES AND MASTOIDS: Mild mucosal disease within the maxillary and sphenoid sinuses. SOFT TISSUES AND SKULL: No acute skull fracture. No acute soft tissue abnormality. VASCULATURE: Moderate calcific atheromatous disease within the carotid siphons. IMPRESSION: 1. No acute intracranial abnormality. 2. Age-related cerebral volume  loss and mild-to-moderate periventricular and subcortical white matter disease. 3. Focal area of increased density within the cortex of the left mid frontal lobe, possibly representing contrast staining from earlier CT angiogram. Electronically signed by: Curvin berrigan 11/21/2023 10:23 AM EDT RP Workstation: [id number]   MR BRAIN WO  CONTRAST Result Date: 11/21/2023 EXAM: MRI BRAIN WITHOUT CONTRAST 11/21/2023 01:09:07 AM TECHNIQUE: Multiplanar multisequence MRI of the head/brain was performed without the administration of intravenous contrast. COMPARISON: None available. CLINICAL HISTORY: Stroke, follow up. FINDINGS: BRAIN AND VENTRICLES: There are multiple scattered small foci of abnormal diffusion restriction indicating acute ischemia within the left MCA (middle cerebral artery) territory, greatest in the frontal lobe. Additionally, there are punctate foci of acute ischemia in the right occipital lobe and right frontal white matter. Multifocal hyperintense T2-weighted signal within the cerebral white matter, most commonly due to chronic small vessel disease. No intracranial hemorrhage. No mass. No midline shift. No hydrocephalus. The sella is unremarkable. Normal flow voids. ORBITS: No acute abnormality. SINUSES AND MASTOIDS: No acute abnormality. BONES AND SOFT TISSUES: Normal marrow signal. No acute soft tissue abnormality. IMPRESSION: 1. Multiple scattered small foci of acute ischemia within the left MCA territory, greatest in the frontal lobe. 2. Punctate foci of acute ischemia in the right occipital lobe and right frontal white matter. 3. Multifocal hyperintense T2-weighted signal within the cerebral white matter, most commonly due to chronic small vessel disease. Electronically signed by: Franky Stanford MD 11/21/2023 01:43 AM EDT RP Workstation: [id number]   IR PERCUTANEOUS ART THROMBECTOMY/INFUSION INTRACRANIAL INC DIAG ANGIO Result Date: 11/20/2023 PROCEDURE PERFORMED: 1. Cerebral angiography with stroke thrombectomy 2. Ultrasound guided vascular access 3. Cone beam CT for treatment planning COMPARISON:  CT angiogram of the head neck performed November 20, 2023 CLINICAL DATA:  73 year old male with acute ischemic stroke with symptoms of aphasia and right-sided weakness. NIH stroke scale measures 24. A multi disciplinary review was  performed and the patient was offered stroke thrombectomy. INDICATION: Acute ischemic stroke. ANESTHESIA/SEDATION: General anesthesia was utilized for the procedure. CONTRAST:  Approximately 50 cc Ominipque 300 MEDICATIONS: Heparin  5000 units intravenously FLUOROSCOPY TIME:  Fluoroscopy Time: 12 minutes, (964 mGy). COMPLICATIONS: None immediate. BODY OF REPORT: Following a full explanation of the procedure along with the potential associated complications, an informed witnessed consent was obtained. The patient was then placed under general anesthesia by the Department of Anesthesiology at [facility]. The right femoral access site was prepped and draped in the usual sterile fashion. Ultrasound was used to study the right common femoral artery which was patent. Using real-time ultrasound guidance, a 19 gauge introducer needle was used to access the right common femoral artery. Access was performed at 1054 a.m. A hard copy image ultrasound the saved and stored in PACS. Using this access, a 6 French sheath was placed in the descending thoracic aorta. Approximately 5000 units of heparin  was administered intravenously. Next, selective catheterization the left internal carotid artery was performed. A selective arteriogram was performed which demonstrated a carotid terminus occlusion with partial perfusion of the ACA and complete malperfusion of the MCA territory. Pretreatment TICI score 1. Next, a penumbra 43 and red 68 catheter was used to selectively catheterize the left M1 segment. The first pass was performed at 1107. This did not achieve recanalization. A second pass was performed with a EMBO trap device position in the left M1 segment at 11:13. This did not result in reperfusion. A third pass was performed with the EMBO trap position in the left M1 segment  at 11:20. This did not result in reperfusion. At this point, I transitioned to a zoom 71 catheter and solitaire device. The solitaire device was deployed  in the left M1 segment at 11:34. This pass resulted in recanalization of the left MCA territory. No residual large vessel occlusion was observed. Slow flow was observed in a few small posterior parietal branches. Post treatment TICI score 2 C. After reviewing the imaging, I elected to terminate the procedure at this point. Evaluation of the right femoral access site demonstrated that the site was suitable for a closure device. A 6 French Angio-Seal device was deployed without complication. Cone beam CT was then performed to evaluate for intracranial hemorrhage and treatment planning. This demonstrated no evidence of significant acute intracranial hemorrhage. The patient was removed from anesthesia and transferred to recovery in stable condition. IMPRESSION: 1. Suction mechanical thrombectomy of left carotid terminus and left M1 occlusion. PLAN: 1. To ICU for routine postoperative supportive care. Electronically Signed   By: Maude Naegeli M.D.   On: 11/20/2023 12:37   IR US  Guide Vasc Access Right Result Date: 11/20/2023 PROCEDURE PERFORMED: 1. Cerebral angiography with stroke thrombectomy 2. Ultrasound guided vascular access 3. Cone beam CT for treatment planning COMPARISON:  CT angiogram of the head neck performed November 20, 2023 CLINICAL DATA:  74 year old male with acute ischemic stroke with symptoms of aphasia and right-sided weakness. NIH stroke scale measures 24. A multi disciplinary review was performed and the patient was offered stroke thrombectomy. INDICATION: Acute ischemic stroke. ANESTHESIA/SEDATION: General anesthesia was utilized for the procedure. CONTRAST:  Approximately 50 cc Ominipque 300 MEDICATIONS: Heparin  5000 units intravenously FLUOROSCOPY TIME:  Fluoroscopy Time: 12 minutes, (964 mGy). COMPLICATIONS: None immediate. BODY OF REPORT: Following a full explanation of the procedure along with the potential associated complications, an informed witnessed consent was obtained. The patient was then  placed under general anesthesia by the Department of Anesthesiology at [facility]. The right femoral access site was prepped and draped in the usual sterile fashion. Ultrasound was used to study the right common femoral artery which was patent. Using real-time ultrasound guidance, a 19 gauge introducer needle was used to access the right common femoral artery. Access was performed at 1054 a.m. A hard copy image ultrasound the saved and stored in PACS. Using this access, a 6 French sheath was placed in the descending thoracic aorta. Approximately 5000 units of heparin  was administered intravenously. Next, selective catheterization the left internal carotid artery was performed. A selective arteriogram was performed which demonstrated a carotid terminus occlusion with partial perfusion of the ACA and complete malperfusion of the MCA territory. Pretreatment TICI score 1. Next, a penumbra 43 and red 68 catheter was used to selectively catheterize the left M1 segment. The first pass was performed at 1107. This did not achieve recanalization. A second pass was performed with a EMBO trap device position in the left M1 segment at 11:13. This did not result in reperfusion. A third pass was performed with the EMBO trap position in the left M1 segment at 11:20. This did not result in reperfusion. At this point, I transitioned to a zoom 71 catheter and solitaire device. The solitaire device was deployed in the left M1 segment at 11:34. This pass resulted in recanalization of the left MCA territory. No residual large vessel occlusion was observed. Slow flow was observed in a few small posterior parietal branches. Post treatment TICI score 2 C. After reviewing the imaging, I elected to terminate the procedure at  this point. Evaluation of the right femoral access site demonstrated that the site was suitable for a closure device. A 6 French Angio-Seal device was deployed without complication. Cone beam CT was then  performed to evaluate for intracranial hemorrhage and treatment planning. This demonstrated no evidence of significant acute intracranial hemorrhage. The patient was removed from anesthesia and transferred to recovery in stable condition. IMPRESSION: 1. Suction mechanical thrombectomy of left carotid terminus and left M1 occlusion. PLAN: 1. To ICU for routine postoperative supportive care. Electronically Signed   By: Maude Naegeli M.D.   On: 11/20/2023 12:37   IR CT Head Ltd Result Date: 11/20/2023 PROCEDURE PERFORMED: 1. Cerebral angiography with stroke thrombectomy 2. Ultrasound guided vascular access 3. Cone beam CT for treatment planning COMPARISON:  CT angiogram of the head neck performed November 20, 2023 CLINICAL DATA:  73 year old male with acute ischemic stroke with symptoms of aphasia and right-sided weakness. NIH stroke scale measures 24. A multi disciplinary review was performed and the patient was offered stroke thrombectomy. INDICATION: Acute ischemic stroke. ANESTHESIA/SEDATION: General anesthesia was utilized for the procedure. CONTRAST:  Approximately 50 cc Ominipque 300 MEDICATIONS: Heparin  5000 units intravenously FLUOROSCOPY TIME:  Fluoroscopy Time: 12 minutes, (964 mGy). COMPLICATIONS: None immediate. BODY OF REPORT: Following a full explanation of the procedure along with the potential associated complications, an informed witnessed consent was obtained. The patient was then placed under general anesthesia by the Department of Anesthesiology at [facility]. The right femoral access site was prepped and draped in the usual sterile fashion. Ultrasound was used to study the right common femoral artery which was patent. Using real-time ultrasound guidance, a 19 gauge introducer needle was used to access the right common femoral artery. Access was performed at 1054 a.m. A hard copy image ultrasound the saved and stored in PACS. Using this access, a 6 French sheath was placed in the descending  thoracic aorta. Approximately 5000 units of heparin  was administered intravenously. Next, selective catheterization the left internal carotid artery was performed. A selective arteriogram was performed which demonstrated a carotid terminus occlusion with partial perfusion of the ACA and complete malperfusion of the MCA territory. Pretreatment TICI score 1. Next, a penumbra 43 and red 68 catheter was used to selectively catheterize the left M1 segment. The first pass was performed at 1107. This did not achieve recanalization. A second pass was performed with a EMBO trap device position in the left M1 segment at 11:13. This did not result in reperfusion. A third pass was performed with the EMBO trap position in the left M1 segment at 11:20. This did not result in reperfusion. At this point, I transitioned to a zoom 71 catheter and solitaire device. The solitaire device was deployed in the left M1 segment at 11:34. This pass resulted in recanalization of the left MCA territory. No residual large vessel occlusion was observed. Slow flow was observed in a few small posterior parietal branches. Post treatment TICI score 2 C. After reviewing the imaging, I elected to terminate the procedure at this point. Evaluation of the right femoral access site demonstrated that the site was suitable for a closure device. A 6 French Angio-Seal device was deployed without complication. Cone beam CT was then performed to evaluate for intracranial hemorrhage and treatment planning. This demonstrated no evidence of significant acute intracranial hemorrhage. The patient was removed from anesthesia and transferred to recovery in stable condition. IMPRESSION: 1. Suction mechanical thrombectomy of left carotid terminus and left M1 occlusion. PLAN: 1. To  ICU for routine postoperative supportive care. Electronically Signed   By: Maude Naegeli M.D.   On: 11/20/2023 12:37   CT ANGIO HEAD NECK W WO CM (CODE STROKE) Result Date: 11/20/2023 CLINICAL  DATA:  73 year old male code stroke presentation. EXAM: CT ANGIOGRAPHY HEAD AND NECK TECHNIQUE: Multidetector CT imaging of the head and neck was performed using the standard protocol during bolus administration of intravenous contrast. Multiplanar CT image reconstructions and MIPs were obtained to evaluate the vascular anatomy. Carotid stenosis measurements (when applicable) are obtained utilizing NASCET criteria, using the distal internal carotid diameter as the denominator. RADIATION DOSE REDUCTION: This exam was performed according to the departmental dose-optimization program which includes automated exposure control, adjustment of the mA and/or kV according to patient size and/or use of iterative reconstruction technique. CONTRAST:  75mL OMNIPAQUE  IOHEXOL  350 MG/ML SOLN COMPARISON:  Plain head CT 1005 hours today reported separately. FINDINGS: CTA NECK Skeleton: Advanced cervical spine degeneration. Degenerative right C3-C4 facet ankylosis. No acute osseous abnormality identified. Upper chest: Mild atelectasis. Right chest Port-A-Cath partially visible. Other neck: Nonvascular neck soft tissue spaces are within normal limits. Aortic arch: Partially visible 3 vessel arch. Right carotid system: Mild tortuosity. Minimal soft and calcified plaque at the right carotid bifurcation. No stenosis. Left carotid system: Mild tortuosity. Mild soft and calcified plaque at the lateral left ICA origin without stenosis. Attenuated enhancement of the left ICA as it approaches the skull base. See intracranial findings below. Vertebral arteries: Non dominant right vertebral artery is patent to the skull base and negative. Visible proximal left subclavian artery, left vertebral artery origin, and dominant left vertebral arteries are patent and within normal limits. CTA HEAD Posterior circulation: Distal vertebral arteries and vertebrobasilar junction are patent, dominant left V4 segment. Diminutive right V4 distal to the patent  right PICA origin. Diminutive left PICA. Left AICA also patent. Patent basilar artery without stenosis. Patent basilar tip, SCA and PCA origins. Posterior communicating arteries are diminutive or absent. Bilateral PCA branches are within normal limits. Anterior circulation: Right ICA siphon is patent. Moderate right siphon calcified plaque, but no significant right siphon stenosis. Patent right ICA terminus. Left ICA T occlusion at the terminus, well demonstrated on series 14, image 39. Upstream attenuated enhancement of the left ICA. Superimposed up to moderate left siphon calcified plaque. Reconstitution of the right ACA A1. Normal anterior communicating artery. However, the right ACA A1 is non dominant (same image). Bilateral ACA branch enhancement within normal limits. Right MCA M1 segment, bifurcation, right MCA branches appear patent with mild branch irregularity. Faintly enhancing left MCA M1 and bifurcation, with poor early left MCA collateralization (series 15, image 32). Venous sinuses: Grossly patent. Anatomic variants: Dominant left vertebral artery. Dominant Left ACA A1. Review of the MIP images confirms the above findings Preliminary results of Left ICA T occlusion were communicated to Dr. Vanessa at 10:17 am on 11/20/2023 by text page via the [facility] messaging system. IMPRESSION: 1. Positive for Left ICA terminus Occlusion - ELVO. The left ACA A1 is dominant, but the ACA branches are reconstituted from the right side. Poor early reconstitution of the left MCA. 2. Other generally mild for age atherosclerosis in the head and neck, no other hemodynamically significant arterial stenosis. Preliminary results were communicated at 1017 hours, and final CTA report communicated to Dr. Vanessa on 11/20/2023 at 1024 hours. Electronically Signed   By: VEAR Hurst M.D.   On: 11/20/2023 10:41   CT HEAD CODE STROKE WO CONTRAST Result Date: 11/20/2023 EXAM: CT  HEAD WITHOUT CONTRAST 11/20/2023 10:11:35 AM TECHNIQUE: CT  of the head was performed without the administration of intravenous contrast. Automated exposure control, iterative reconstruction, and/or weight based adjustment of the mA/kV was utilized to reduce the radiation dose to as low as reasonably achievable. COMPARISON: None available. CLINICAL HISTORY: Neuro deficit, acute, stroke suspected. FINDINGS: BRAIN AND VENTRICLES: No acute hemorrhage. Gray-white differentiation is preserved. No hydrocephalus. No extra-axial collection. No mass effect or midline shift. There is mild periventricular white matter disease present. Aspect score is 10. ORBITS: No acute abnormality. SINUSES: No acute abnormality. SOFT TISSUES AND SKULL: No acute soft tissue abnormality. No skull fracture. Note: The above findings were communicated to the ordering clinician Dr. Sal Khaliqdina at 10:15 am Nov 20 2023 via the [facility] paging service. IMPRESSION: 1. No acute intracranial abnormality. 2. Mild periventricular white matter disease. Electronically signed by: evalene coho 11/20/2023 10:22 AM EDT RP Workstation: [id number]       HISTORY OF PRESENT ILLNESS 73 y.o. patient with history of HTN, DVT/PE on eliquis , bladder cancer s/p nephrostomy admitted for right-sided weakness, left gaze and aphasia . NIH on Admission 24   HOSPITAL COURSE Stroke:  left MCA scattered infarcts with left tICA and M1 occlusion s/p IR with TICI2c Etiology: Likely due to hypercoagulable state in the setting of advanced cancer Code Stroke CT head No acute abnormality.   CTA head & neck 8/2 Positive for Left ICA terminus Occlusion with decreased L MCA flow S/p IR with left tICA and M1 occlusion with TICI2c MRI 8/3 Multiple scattered small foci of acute ischemia within the left MCA territory, greatest in the frontal lobe. Punctate foci of acute ischemia in the right occipital lobe and right frontal white matter. 2D Echo EF 50-55%, bubble study negative US  LE positive for age indeterminate DVTs: RT - PopV and  PTV LT - CFV, PFV, FV, PopV, Gastrocs  TCD bubble study no DVT HgbA1c 5.7 VTE prophylaxis - SCDs Eliquis  prior to admission,  Now on eliquis   Therapy recommendations:  CIR   Recurrent stroke: extension of left MCA scattered infarcts with left M1 re-occlusion s/p IR with TICI 2b due to dissection and hypotension. Acute neuro change with PT and sitting up, BP was low, received head of bed flat and IVF, BP improved but still has worsening left MCA syndrome Repeat CT head 8/3 - no acute process Repeat CTA head and neck with Left M1 branch occlusion at its origin  CTP core infarct 4cc, mismatch 43 cc Repeat IR with TICI2b Post IR repeat CTA showed interval recanalization of the M1 segment of the left MCA. Moderate stenosis distally in the recanalized M1 segment of the left MCA, which is also diffusely irregular. MRI brain repeat showed scattered left MCA infarcts, extended from prior MRA left MCA patent  Now on eliquis     History of DVT/PE Hx of multiple b/l DVT and PE Had IVC filter placed ~ 2014 Likely related to hypercoagulable state from advanced malignancy Home Meds: Eliquis  2.5 bid US  LE positive for age indeterminate DVTs: RT - PopV and PTV LT - CFV, PFV, FV, PopV, Gastrocs  Eliquis  restarted    Hx of hypertension Orthostatic hypotension Home meds: Amlodipine  5 mg, carvedilol  12.5 mg Orthostasis 8/4 am with low BP on sitting, improved with supine and IVF Resume amlodipine  5 mg Avoid low BP Long term BP goal normotensive   Hyperlipidemia Home meds: None LDL 112, goal < 70 Add atorvastatin  40  Continue statin at discharge   Bladder cancer  s/p bilateral nephrostomy tubes AKI UTI Fever Poorly differentiated bladder cancer, now stage IV, metastatic to the bone by MRI of the spine on 11/08/2023.  Cr 1.3 -1.29-1.44 -1.27--1.22 -1.42-1.52 UA WBC > 50 On Rocephin  ends 8/10 Fever Tmax 101.5--100.4--afebrile   Anemia Hgb 9.0-8.8-8.2 -7.8-6.7-PRBC 2U- 9.7-9.9 Likely due to  hemodilution with IV fluid on top of anemia of chronic disease  Dysphagia, improved Patient has post-stroke dysphagia Speech consulted Now passed swallow On diet, DC IV fluid   Other Stroke Risk Factors Obesity, Body mass index is 33.06 kg/m., BMI >/= 30 associated with increased stroke risk, recommend weight loss, diet and exercise as appropriate    Other acute medical issues Hx of prostate cancer, on ADT H/o laryngeal cancer in 2021. S/p radiation therapy.    DISCHARGE EXAM  PHYSICAL EXAM General: Critically ill elderly male in no apparent distress  psych:  Mood and affect appropriate for situation CV: Regular rate and rhythm on monitor Respiratory:  Regular, unlabored respirations on room air GI: Abdomen soft and nontender     NEURO:  Mental Status: Awake and alert.  Still with moderate aphasia however is able to repeat and follow commands. speech is dysarthric and garbled   Cranial Nerves:  II: PERRL. Visual fields with right hemianopia III, IV, VI: EOM with left gaze preference, can get to midline but cannot cross V: Sensation is intact to light touch and symmetrical to face.  VII: Right facial droop VIII: hearing intact to voice. IX, X: Palate elevates symmetrically. Phonation is normal.  KP:Dynloizm shrug 5/5. XII: tongue is midline without fasciculations. Motor: Right arm flaccid with minimal movement, right leg with drift, left arm and left leg antigravity and spontaneous movement Tone: is normal and bulk is normal Sensation-decreased on right arm, withdraws on right leg.  Has right neglect Coordination: FTN intact bilaterally, HKS: no ataxia in BLE.No drift.  Gait- deferred   Most Recent NIH  1a Level of Conscious.:  1b LOC Questions: 2 1c LOC Commands: 1 2 Best Gaze: 1 3 Visual: 1 4 Facial Palsy: 1 5a Motor Arm - left: 0 5b Motor Arm - Right: 4 6a Motor Leg - Left:  6b Motor Leg - Right: 2 7 Limb Ataxia: 0 8 Sensory: 1 9 Best Language: 2 10  Dysarthria: 2 11 Extinct. and Inatten.: 2 TOTAL: 19     Discharge Diet       Diet   Diet regular Room service appropriate? Yes with Assist; Fluid consistency: Thin   liquids  DISCHARGE PLAN Disposition: Rehab Eliquis  for secondary stroke prevention  Ongoing stroke risk factor control by Primary Care Physician at time of discharge Follow-up PCP Ina Marcellus RAMAN, MD in 2 weeks. Follow-up in [facility] in 8 weeks, office to schedule an appointment.  Able to see NP in clinic.  50 minutes were spent preparing discharge.   Karna Geralds DNP, ACNPC-AG  [facility]  ATTENDING NOTE: I reviewed above note and agree with the assessment and plan. Pt was seen and examined.   Wife at the bedside. Pt sitting in chair, still has mild right facial droop and right hemiparesis but much improved. Still has mild to moderate expressive aphasia. Hb stable, now switch ASA to eliquis  full dose. Na up to 147 and cre elevated, likely due to dehydration, put on gentle hydration and encourage po intake. Medically stable for CIR placement. Follow up with [facility] and Dr. Lester as outpt.   For detailed assessment and plan, please refer  to above as I have made changes wherever appropriate.   Ary Cummins, MD PhD Stroke Neurology 11/25/2023 10:08 PM

## 2023-11-25 NOTE — Progress Notes (Signed)
 Occupational Therapy Treatment Patient Details Name: Allen Macdonald MRN: [id number] DOB: 02/19/1951 Today's Date: 11/25/2023   History of present illness 73 y.o. male presents to [facility] hospital on 11/20/2023 with R weakness, L gaze and expressive aphasia. CTA with L ICA occlusion, pt underwent thrombectomy on 8/3. Pt with neuro change on 8/4, found to have L M1 occlusion and underwent subsequent thrombectomy and found to have L MCA dissection. PMH includes HTN, DVT/PE, bladder CA s/p nephrostomy..   OT comments  Pt. Seen for skilled therapy session with PT for progressing mobility/ADLs. Pt. Making great gains.  Able to complete bed mobility with MIN A +2 for trunk support and cues for scooting and keeping feet flat on the floor.  Able to complete standing grooming task with LUE no lob noted.  Set up seated for self feeding with light cues for incorporation of use of R hand also for holding containers and opening packages.  Pt. Able to complete ambulation in room out to the hall and back.  Answers questions without delay but when participating in conversation or wanting to provide additional details notable delay and some frustration with word finding.  With encouragement to take his time and try and remain patient he was able to find what he was wanting to say.  Pt. Remains an excellent candidate for continued therapies >3hrs/day.         If plan is discharge home, recommend the following:  A lot of help with walking and/or transfers;A lot of help with bathing/dressing/bathroom;Assistance with cooking/housework;Direct supervision/assist for medications management;Direct supervision/assist for financial management;Assist for transportation;Help with stairs or ramp for entrance;Supervision due to cognitive status   Equipment Recommendations       Recommendations for Other Services PT consult;Rehab consult    Precautions / Restrictions Precautions Precautions: Fall;Other (comment) Recall of  Precautions/Restrictions: Impaired Precaution/Restrictions Comments: bilat nephrostomy bags       Mobility Bed Mobility Overal bed mobility: Needs Assistance Bed Mobility: Rolling, Sidelying to Sit Rolling: Min assist, +2 for physical assistance Sidelying to sit: Min assist, +2 for physical assistance       General bed mobility comments: increased time,cues for sequencing    Transfers Overall transfer level: Needs assistance Equipment used: Rolling walker (2 wheels) Transfers: Sit to/from Stand, Bed to chair/wheelchair/BSC Sit to Stand: Min assist     Step pivot transfers: Mod assist     General transfer comment: Assist to maintain balance and manage RW. Initial assist to place R hand but able to grip RW throughout transfer. Cues for sequencing and posture. No knee buckling noted. Pt with hesitant, guarded steps, notable improvements with gait towards end of session. see PT note for further details     Balance                                           ADL either performed or assessed with clinical judgement   ADL Overall ADL's : Needs assistance/impaired Eating/Feeding: Sitting;Set up;Cueing for sequencing;Cueing for compensatory techinques Eating/Feeding Details (indicate cue type and reason): cues for use of R hand to stabalize container while L hand pulled the lid open, along with opening package on silverware Grooming: Set up;Standing;Wash/dry face;Cueing for sequencing Grooming Details (indicate cue type and reason): cues for thoroughness to reach whole face not just L side             Lower Body Dressing: Maximal  assistance;Sitting/lateral leans Lower Body Dressing Details (indicate cue type and reason): able to cross L leg over knee to reach sock but unable to cross R leg over L knee Toilet Transfer: Minimal assistance;Ambulation;Regular Toilet;+2 for physical assistance;Rolling walker (2 wheels) Toilet Transfer Details (indicate cue type and  reason): in room ambulation with PTA also, including multiple transfers to/from recliner that simulated toileting along with the distance         Functional mobility during ADLs: Minimal assistance      Extremity/Trunk Assessment              Vision       Perception     Praxis     Communication Communication Communication: Impaired Factors Affecting Communication: Difficulty expressing self   Cognition Arousal: Alert Behavior During Therapy: Flat affect Cognition: Difficult to assess             OT - Cognition Comments: difficulty expressing self                 Following commands: Impaired Following commands impaired: Follows multi-step commands inconsistently, Only follows one step commands consistently, Follows one step commands with increased time      Cueing   Cueing Techniques: Verbal cues, Gestural cues, Tactile cues  Exercises      Shoulder Instructions       [place].  Likes the [employer] and the bills    Pertinent Vitals/ Pain       Pain Assessment Pain Assessment: No/denies pain  Home Living                                          Prior Functioning/Environment              Frequency  Min 2X/week        Progress Toward Goals  OT Goals(current goals can now be found in the care plan section)  Progress towards OT goals: Progressing toward goals     Plan      Co-evaluation                 AM-PAC OT 6 Clicks Daily Activity     Outcome Measure   Help from another person eating meals?: A Little Help from another person taking care of personal grooming?: A Little Help from another person toileting, which includes using toliet, bedpan, or urinal?: A Lot Help from another person bathing (including washing, rinsing, drying)?: A Lot Help from another person to put on and taking off regular upper body clothing?: A Lot Help from another person to put on and  taking off regular lower body clothing?: A Lot 6 Click Score: 14    End of Session Equipment Utilized During Treatment: Gait belt;Rolling walker (2 wheels)  OT Visit Diagnosis: Unsteadiness on feet (R26.81);Other abnormalities of gait and mobility (R26.89);Muscle weakness (generalized) (M62.81)   Activity Tolerance Patient tolerated treatment well   Patient Left in chair;with call bell/phone within reach;with chair alarm set;with nursing/sitter in room;Other (comment) (alerted rn chair alarm box not working properly, he was actively working on it as we left the room)   Nurse Communication Mobility status;Other (comment) (alerted rn pts. chair alarm box was not working)        Time: 9160-9083 OT Time Calculation (min): 37 min  Charges: OT General Charges $OT Visit: 1 Visit OT Treatments $Self  Care/Home Management : 8-22 mins  Randall, COTA/L Acute Rehabilitation [phone]   CHRISTELLA Nest Lorraine-COTA/L  11/25/2023, 10:38 AM

## 2023-11-25 NOTE — H&P (Signed)
 Physical Medicine and Rehabilitation Admission H&P        Chief Complaint  Patient presents with   Debility due to CVA   : HPI: Allen Macdonald is a 73 year old male with PMHx of HTN, DVT/PE on Eliquis , bladder cancer s/p nephrostomy. Per chart review he had taken Eliquis  2.5 mg at bedtime, around 0815 he was unable to get up from the toilet and became mute. He presented to [facility] via EMS for right sided weakness, left gaze and aphasia. Code stroke was activated upon arrival. Ct head showed no intracranial abnormality. Neurology consulted, CTA head/neck demonstrated a terminal left ICA occlusion with poor opacification of the left MCA branches. He was not a candidate for TNK with previous Eliquis  use. On 8/3 patient underwent emergent stent retriever assisted mechanical thrombectomy of left carotid T and M1 occlusions with TICI 2C by Dr. Ray. While working with PT on 8/4 he experienced left gaze, slurred speech and RUE weakness was note and neurology was consulted.  Stat CT head and CTA head and neck with perfusion which revealed left M1 occlusion,Infarct core within the left temporal lobe and penumbra involving the temporal, frontal, and parietal lobe. Code IR was activated and he underwent M1 Thrombectomy due to dissection of left M1 with TICI 2B vascularization, cangrelor  drip was initiated. Post intervention pt still have no improvement with right arm flaccidity and aphasia. Repeat CTA head/neck showed no LVO. US  LE positive for age indeterminate DVTs. He recevievd 2 units PRBC for ABLA and Eliquis  was resumed as hgb stabilized.  Pt reports strength gradually improving.  He sometimes uses compression hose for LE swelling at home.  Prior to arrival patient was independent with use of home equipment at home. Currently he requires min assist for functional mobility, mod assist with RW with cues for sequencing and posture, and min assist for ADLs. Therapy evaluations completed due  to patient decreased functional mobility was admitted for a comprehensive rehab program.    Review of Systems  Constitutional:  Negative for chills and fever.  HENT:  Negative for hearing loss.   Eyes:  Negative for blurred vision and double vision.  Cardiovascular:  Positive for leg swelling. Negative for chest pain.  Gastrointestinal:  Negative for abdominal pain, blood in stool, diarrhea, nausea and vomiting.  Genitourinary: Negative.   Musculoskeletal:  Negative for back pain, joint pain and neck pain.  Skin:  Negative for itching and rash.  Neurological:  Positive for weakness. Negative for dizziness and headaches.  Psychiatric/Behavioral:  The patient does not have insomnia.             Past Medical History:  Diagnosis Date   Abnormal stress test minimal ischemia involving apex 05/01/2019   Bladder cancer (HCC)     Dilated cardiomyopathy (HCC)     Essential hypertension 12/18/2015   History of DVT (deep vein thrombosis) 04/22/2017   History of pulmonary embolism 12/18/2015   Laryngeal cancer (HCC) squamous cell 02/25/2020   PC (prostate cancer) (HCC) 08/28/2019             Past Surgical History:  Procedure Laterality Date   CATARACT EXTRACTION Bilateral     INSERTION PROSTATE RADIATION SEED   2003   IR CT HEAD LTD   11/20/2023   IR PERCUTANEOUS ART THROMBECTOMY/INFUSION INTRACRANIAL INC DIAG ANGIO   11/20/2023   IR PERCUTANEOUS ART THROMBECTOMY/INFUSION INTRACRANIAL INC DIAG ANGIO   11/21/2023   IR US  GUIDE VASC ACCESS  RIGHT   11/20/2023   IR US  GUIDE VASC ACCESS RIGHT   11/21/2023   PROSTATE SURGERY       RADIOLOGY WITH ANESTHESIA N/A 11/20/2023    Procedure: RADIOLOGY WITH ANESTHESIA;  Surgeon: Radiologist, Medication, MD;  Location: [facility] OR;  Service: Radiology;  Laterality: N/A;   RADIOLOGY WITH ANESTHESIA N/A 11/21/2023    Procedure: RADIOLOGY WITH ANESTHESIA;  Surgeon: Radiologist, Medication, MD;  Location: [facility] OR;  Service: Radiology;  Laterality: N/A;   TONSILLECTOMY        Venacava Filter       WISDOM TOOTH EXTRACTION                 Family History  Problem Relation Age of Onset   Heart failure Mother     Pleurisy Mother     Other Father          UNK MEDICAL HX        Social History:  reports that he has never smoked. He has never used smokeless tobacco. He reports current alcohol use of about 14.0 standard drinks of alcohol per week. He reports that he does not use drugs. Allergies:  Allergies      Allergies  Allergen Reactions   Bee Venom Anaphylaxis            Medications Prior to Admission  Medication Sig Dispense Refill   amLODipine  (NORVASC ) 5 MG tablet Take 5 mg by mouth daily.       apixaban  (ELIQUIS ) 2.5 MG TABS tablet Take 2.5 mg by mouth 2 (two) times daily.       bicalutamide  (CASODEX ) 50 MG tablet Take 50 mg by mouth daily.       carvedilol  (COREG ) 12.5 MG tablet Take 1 tablet (12.5 mg total) by mouth 2 (two) times daily. 60 tablet 2   denosumab  (PROLIA ) 60 MG/ML SOSY injection Inject 60 mg into the skin every 6 (six) months.       famotidine  (PEPCID ) 20 MG tablet Take 1 tablet (20 mg total) by mouth 2 (two) times daily. (Patient taking differently: Take 20 mg by mouth daily as needed for heartburn or indigestion.) 180 tablet 3   loratadine  (CLARITIN ) 10 MG tablet Take 1 tablet (10 mg total) by mouth 2 (two) times daily. 180 tablet 3   omeprazole  (PRILOSEC) 40 MG capsule TAKE 1 CAPSULE BY MOUTH EVERY MORNING (Patient taking differently: Take 40 mg by mouth daily as needed (Heartburn).) 30 capsule 5              Home: Home Living Family/patient expects to be discharged to:: Private residence Living Arrangements: Spouse/significant other Available Help at Discharge: Family, Available 24 hours/day Type of Home: House Home Access: Stairs to enter [employer] of Steps: 3 Entrance Stairs-Rails: Can reach both Home Layout: One level Home Equipment: Cane - single point, Toilet riser, Agricultural consultant (2 wheels), Rollator  (4 wheels), Wheelchair - manual, Shower seat  Lives With: Spouse   Functional History: Prior Function Prior Level of Function : Independent/Modified Independent, Driving   Functional Status:  Mobility: Bed Mobility Overal bed mobility: Needs Assistance Bed Mobility: Rolling, Sidelying to Sit Rolling: Min assist, +2 for physical assistance Sidelying to sit: Min assist, +2 for physical assistance Supine to sit: Min assist, Used rails, HOB elevated General bed mobility comments: increased time,cues for sequencing. Assist for trunk elevation Transfers Overall transfer level: Needs assistance Equipment used: Rolling walker (2 wheels) Transfers: Sit to/from Stand Sit to Stand: Min assist Bed to/from  chair/wheelchair/BSC transfer type:: Step pivot Step pivot transfers: Mod assist General transfer comment: Assist to maintain balance and manage RW. Initial assist to place R hand but able to grip RW throughout transfer. Cues for sequencing and posture. No knee buckling noted. Ambulation/Gait Ambulation/Gait assistance: Contact guard assist Gait Distance (Feet): 40 Feet (x2) Assistive device: Rolling walker (2 wheels) Gait Pattern/deviations: Step-to pattern, Step-through pattern, Trunk flexed, Decreased stride length General Gait Details: Pt demonstrates slow, guarded steps with cues for RW management and upright posture. Pt requires 1 seated rest break due to fatigue and shaking. Pt demonstrates improved step length and fluidity during second trial Gait velocity: reduced Gait velocity interpretation: <1.31 ft/sec, indicative of household ambulator   ADL: ADL Overall ADL's : Needs assistance/impaired Eating/Feeding: Sitting, Set up, Cueing for sequencing, Cueing for compensatory techinques Eating/Feeding Details (indicate cue type and reason): cues for use of R hand to stabalize container while L hand pulled the lid open, along with opening package on silverware Grooming: Set up,  Standing, Wash/dry face, Cueing for sequencing Grooming Details (indicate cue type and reason): cues for thoroughness to reach whole face not just L side Upper Body Bathing: Moderate assistance, Sitting Lower Body Bathing: Moderate assistance, Sitting/lateral leans, Sit to/from stand Upper Body Dressing : Moderate assistance, Standing, Sitting Lower Body Dressing: Maximal assistance, Sitting/lateral leans Lower Body Dressing Details (indicate cue type and reason): able to cross L leg over knee to reach sock but unable to cross R leg over L knee Toilet Transfer: Minimal assistance, Ambulation, Regular Toilet, +2 for physical assistance, Rolling walker (2 wheels) Toilet Transfer Details (indicate cue type and reason): in room ambulation with PTA also, including multiple transfers to/from recliner that simulated toileting along with the distance Toileting- Clothing Manipulation and Hygiene: Contact guard assist, Sitting/lateral lean Functional mobility during ADLs: Minimal assistance   Cognition: Cognition Overall Cognitive Status: Within Functional Limits for tasks assessed Orientation Level: Oriented to situation, Disoriented to person, Disoriented to place, Disoriented to time Cognition Arousal: Alert Behavior During Therapy: Flat affect Overall Cognitive Status: Within Functional Limits for tasks assessed   Physical Exam: Blood pressure 118/65, pulse 91, temperature 97.9 F (36.6 C), temperature source Oral, resp. rate 17, height 5' 10 (1.778 m), weight 104.5 kg, SpO2 100%. Physical Exam   General: No apparent distress HEENT: Head is normocephalic, atraumatic, sclera anicteric, oral mucosa pink and moist Neck: Supple without JVD or lymphadenopathy Heart: Reg rate and rhythm. No murmurs rubs or gallops, port R chest  Chest: CTA bilaterally without wheezes, rales, or rhonchi; no distress Abdomen: Soft, non-tender, non-distended, bowel sounds positive. B/l nephrostomy tubes with yellow  urine Extremities: 1+ LE edema- chronic Psych: Pt's affect is a little flat  Skin: R groin site with Dry dressing,  Neuro:     Mental Status: AAOx person, place, not date. Able to answer situation with choices. Delayed responses  Speech/Languate: + Aphasia. Word finding difficulties. Mild dysarthria. Naming and repetition intact, follows simple commands CRANIAL NERVES: II: PERRL. Visual fields full III, IV, VI: EOM intact, no gaze preference or deviation V: normal sensation bilaterally VII: Slight right facial weakness VIII: normal hearing to speech IX, X: normal palatal elevation XI: head turn and shoulder shrug decreased on R XII: Tongue midline     MOTOR: RUE: 3/5 Deltoid, 4-/5 Biceps, 4-/5 Triceps,4-/5 Grip LUE: 5/5 Deltoid, 4+/5 Biceps, 4+/5 Triceps, 5/5 Grip RLE: HF 4/5, KE 4/5, ADF 4/5, APF 4/5 LLE: HF 5/5, KE 5/5, ADF 5/5, APF 5/5   SENSORY: Normal  to touch all 4 extremities   Coordination: Normal finger to nose intact b/l, slow on R   Increased tone R biceps   Lab Results Last 48 Hours        Results for orders placed or performed during the hospital encounter of 11/20/23 (from the past 48 hours)  Glucose, capillary     Status: Abnormal    Collection Time: 11/23/23  5:01 PM  Result Value Ref Range    Glucose-Capillary 111 (H) 70 - 99 mg/dL      Comment: Glucose reference range applies only to samples taken after fasting for at least 8 hours.  CBC     Status: Abnormal    Collection Time: 11/23/23  9:10 PM  Result Value Ref Range    WBC 7.1 4.0 - 10.5 K/uL    RBC 3.24 (L) 4.22 - 5.81 MIL/uL    Hemoglobin 9.5 (L) 13.0 - 17.0 g/dL      Comment: REPEATED TO VERIFY POST TRANSFUSION SPECIMEN      HCT 29.3 (L) 39.0 - 52.0 %    MCV 90.4 80.0 - 100.0 fL    MCH 29.3 26.0 - 34.0 pg    MCHC 32.4 30.0 - 36.0 g/dL    RDW 81.2 (H) 88.4 - 15.5 %    Platelets 145 (L) 150 - 400 K/uL    nRBC 0.0 0.0 - 0.2 %      Comment: Performed at [facility], [address], [place], [place] [address]  Glucose, capillary     Status: Abnormal    Collection Time: 11/23/23  9:41 PM  Result Value Ref Range    Glucose-Capillary 124 (H) 70 - 99 mg/dL      Comment: Glucose reference range applies only to samples taken after fasting for at least 8 hours.  CBC     Status: Abnormal    Collection Time: 11/24/23  5:53 AM  Result Value Ref Range    WBC 6.8 4.0 - 10.5 K/uL    RBC 3.24 (L) 4.22 - 5.81 MIL/uL    Hemoglobin 9.7 (L) 13.0 - 17.0 g/dL    HCT 71.0 (L) 60.9 - 52.0 %    MCV 89.2 80.0 - 100.0 fL    MCH 29.9 26.0 - 34.0 pg    MCHC 33.6 30.0 - 36.0 g/dL    RDW 80.6 (H) 88.4 - 15.5 %    Platelets 141 (L) 150 - 400 K/uL    nRBC 0.0 0.0 - 0.2 %      Comment: Performed at [facility], [address], [place], [place] [address]  Basic metabolic panel with GFR     Status: Abnormal    Collection Time: 11/24/23  5:53 AM  Result Value Ref Range    Sodium 141 135 - 145 mmol/L    Potassium 3.6 3.5 - 5.1 mmol/L    Chloride 114 (H) 98 - 111 mmol/L    CO2 18 (L) 22 - 32 mmol/L    Glucose, Bld 114 (H) 70 - 99 mg/dL      Comment: Glucose reference range applies only to samples taken after fasting for at least 8 hours.    BUN 23 8 - 23 mg/dL    Creatinine, Ser 8.57 (H) 0.61 - 1.24 mg/dL    Calcium  7.9 (L) 8.9 - 10.3 mg/dL    GFR, Estimated 52 (L) >60 mL/min      Comment: (NOTE) Calculated using the CKD-EPI Creatinine Equation (2021)  Anion gap 9 5 - 15      Comment: Performed at [facility], [address] [place], [place] [address]  Magnesium     Status: None    Collection Time: 11/24/23  5:53 AM  Result Value Ref Range    Magnesium 1.9 1.7 - 2.4 mg/dL      Comment: Performed at [facility], [address] [place], [place] [address]  Phosphorus     Status: None    Collection Time: 11/24/23  5:53 AM  Result Value Ref Range    Phosphorus 4.2 2.5 - 4.6 mg/dL      Comment: Performed at [facility], [address], [place], [place]  [address]  Glucose, capillary     Status: None    Collection Time: 11/24/23  7:16 AM  Result Value Ref Range    Glucose-Capillary 85 70 - 99 mg/dL      Comment: Glucose reference range applies only to samples taken after fasting for at least 8 hours.  PSA     Status: None    Collection Time: 11/24/23 10:24 AM  Result Value Ref Range    Prostatic Specific Antigen 0.01 0.00 - 4.00 ng/mL      Comment: (NOTE) While PSA levels of <=4.00 ng/ml are reported as reference range, some men with levels below 4.00 ng/ml can have prostate cancer and many men with PSA above 4.00 ng/ml do not have prostate cancer.  Other tests such as free PSA, age specific reference ranges, PSA velocity and PSA doubling time may be helpful especially in men less than 29 years old. Performed at [facility], [address], [place], [place] [address]    Glucose, capillary     Status: Abnormal    Collection Time: 11/24/23 11:17 AM  Result Value Ref Range    Glucose-Capillary 122 (H) 70 - 99 mg/dL      Comment: Glucose reference range applies only to samples taken after fasting for at least 8 hours.  Glucose, capillary     Status: Abnormal    Collection Time: 11/24/23  9:58 PM  Result Value Ref Range    Glucose-Capillary 106 (H) 70 - 99 mg/dL      Comment: Glucose reference range applies only to samples taken after fasting for at least 8 hours.  CBC     Status: Abnormal    Collection Time: 11/25/23  4:15 AM  Result Value Ref Range    WBC 6.3 4.0 - 10.5 K/uL    RBC 3.36 (L) 4.22 - 5.81 MIL/uL    Hemoglobin 9.9 (L) 13.0 - 17.0 g/dL    HCT 69.9 (L) 60.9 - 52.0 %    MCV 89.3 80.0 - 100.0 fL    MCH 29.5 26.0 - 34.0 pg    MCHC 33.0 30.0 - 36.0 g/dL    RDW 81.0 (H) 88.4 - 15.5 %    Platelets 143 (L) 150 - 400 K/uL    nRBC 0.0 0.0 - 0.2 %      Comment: Performed at [facility], [address] [place], [place] [address]  Basic metabolic panel with GFR     Status: Abnormal    Collection Time: 11/25/23   4:15 AM  Result Value Ref Range    Sodium 147 (H) 135 - 145 mmol/L    Potassium 3.6 3.5 - 5.1 mmol/L    Chloride 113 (H) 98 - 111 mmol/L    CO2 21 (  L) 22 - 32 mmol/L    Glucose, Bld 115 (H) 70 - 99 mg/dL      Comment: Glucose reference range applies only to samples taken after fasting for at least 8 hours.    BUN 20 8 - 23 mg/dL    Creatinine, Ser 8.47 (H) 0.61 - 1.24 mg/dL    Calcium  8.2 (L) 8.9 - 10.3 mg/dL    GFR, Estimated 47 (L) >60 mL/min      Comment: (NOTE) Calculated using the CKD-EPI Creatinine Equation (2021)      Anion gap 13 5 - 15      Comment: Performed at [facility], [address], [place], [place] [address]  Glucose, capillary     Status: Abnormal    Collection Time: 11/25/23  6:30 AM  Result Value Ref Range    Glucose-Capillary 117 (H) 70 - 99 mg/dL      Comment: Glucose reference range applies only to samples taken after fasting for at least 8 hours.    Comment 1 Notify RN    Glucose, capillary     Status: Abnormal    Collection Time: 11/25/23 11:42 AM  Result Value Ref Range    Glucose-Capillary 148 (H) 70 - 99 mg/dL      Comment: Glucose reference range applies only to samples taken after fasting for at least 8 hours.      Imaging Results (Last 48 hours)  No results found.         Blood pressure 118/65, pulse 91, temperature 97.9 F (36.6 C), temperature source Oral, resp. rate 17, height 5' 10 (1.778 m), weight 104.5 kg, SpO2 100%.   Medical Problem List and Plan: 1. Functional deficits secondary to left MCA scattered infarcts with left ICA and left MCA occlusion s/p IR with TICI2c likely due to hypercoagulable state in setting of cancer             -patient may shower, cover incision/nephrostomy tubes             -ELOS/Goals: 12-16 days, PT/OT sup to min A, SLP supervision             -Admit to CIR 2.  Antithrombotics: -DVT/anticoagulation:  Mechanical: Sequential compression devices, below knee Bilateral lower extremities Pharmaceutical:  Eliquis              -antiplatelet therapy: N/A 3. Pain Management: Tylenol  prn  4. Mood/Behavior/Sleep: LCSW to follow for evaluation and support when available.              -antipsychotic agents: N/A 5. Neuropsych/cognition: This patient may be capable of making decisions on his own behalf. 6. Skin/Wound Care: routine pressure relief measures  7. Fluids/Electrolytes/Nutrition: Monitor I/O              - Per speech past swallow study continue regular diet w/ Ensure and multivitamin supplements 8.  Bladder Cancer s/p nephrostomy tubes: stage IV, metastatic to the bone by MRI of the spine on 11/08/2023.  -UTI: Ceftriaxone  ends 8/10 -Hx of prostate CA, on androgen therapy   9. AKI: Monitor BMP w/GFR  Cr 1.22--1.42--1.52. Monitor fluid intake 10.  Anemia: Hemoglobin stable post PRBC 2 units Hgb 9.9  11. HTN: hx of orthostasis on 8/4 resolved after IV fluids--home amlodipine  5 mg resumed--Carvdilol 12.5 mg not resumed - resume as needed. Avoid hypotension 12. DVT: hx of multiple BL DVT and PE had IVC filter placed 2014 --resumed Eliquis               -  US  LE positive for age indeterminate DVTs: RT - PopV and PTV LT - CFV, PFV, FV, PopV, Gastrocs  13. HLD: LDL 112, goal < 70 atorvastatin  40 mg  14. Obesity: Educate on diet and weight loss to promote overall health and mobility.  Body mass index is 33.06 kg/m. 15. Hx of laryngeal cancer s/p radiation therapy     Brandi L Leak, NP 11/25/2023   I have personally performed a face to face diagnostic evaluation of this patient and formulated the key components of the plan.  Additionally, I have personally reviewed laboratory data, imaging studies, as well as relevant notes and concur with the physician assistant's documentation above.   The patient's status has not changed from the original H&P.  Any changes in documentation from the acute care chart have been noted above.   Murray Collier, MD

## 2023-11-25 NOTE — TOC Transition Note (Signed)
 Transition of Care [facility]) - Discharge Note   Patient Details  Name: Allen Macdonald MRN: [id number] Date of Birth: 1950-11-05  Transition of Care [facility]) CM/SW Contact:  Andrez JULIANNA George, RN Phone Number: 11/25/2023, 12:56 PM   Clinical Narrative:     Pt is discharging to CIR today. CM signing off.   Final next level of care: IP Rehab Facility Barriers to Discharge: No Barriers Identified   Patient Goals and CMS Choice   CMS Medicare.gov Compare Post Acute Care list provided to:: Patient Choice offered to / list presented to : Patient      Discharge Placement                       Discharge Plan and Services Additional resources added to the After Visit Summary for                                       Social Drivers of Health (SDOH) Interventions SDOH Screenings   Food Insecurity: No Food Insecurity (11/20/2023)  Housing: Low Risk  (11/20/2023)  Transportation Needs: No Transportation Needs (11/20/2023)  Utilities: Not At Risk (11/20/2023)  Social Connections: Unknown (11/24/2023)  Tobacco Use: Low Risk  (11/23/2023)     Readmission Risk Interventions     No data to display

## 2023-11-25 NOTE — Progress Notes (Signed)
 Urbano Albright, MD  Physician Physical Medicine and Rehabilitation   PMR Pre-admission    Signed   Date of Service: 11/24/2023  1:05 PM  Related encounter: ED to Hosp-Admission (Current) from 11/20/2023 in [place] [facility]   Signed     Expand All Collapse All  PMR Admission Coordinator Pre-Admission Assessment   Patient: Allen Macdonald is an 73 y.o., male MRN: [id number] DOB: 09-12-1950 Height: 5' 10 (177.8 cm) Weight: 104.5 kg   Insurance Information HMO:     PPO: yes     PCP:      IPA:      80/20:      OTHER:  PRIMARY: [employer] Medicare HMO/PPO     Policy#: [id number]       Subscriber: pt CM Name: Allen Macdonald      Phone#: not provided     Fax#: [phone] Pre-Cert#: [id number] auth for CIR with updates due to fax listed above on 8/15      Employer:  Benefits:  Phone #: [phone]     Name:  Eff. Date: 04/20/23     Deduct: $0      Out of Pocket Max: $4150 (met)      Life Max:  CIR: $300/day for days 1-6      SNF: $10/day Outpatient:      Co-Pay: $10/visit Home Health: 100%      Co-Pay:  DME: 80%     Co-Pay: 20% Providers:  SECONDARY:       Policy#:      Phone#:    Artist:       Phone#:    The Engineer, materials Information Summary" for patients in Inpatient Rehabilitation Facilities with attached "Privacy Act Statement-Health Care Records" was provided and verbally reviewed with: Patient and Family   Emergency Contact Information Contact Information       Name Relation Home Work Mobile    Butler Spouse [phone]         Other Contacts   None on File        Current Medical History  Patient Admitting Diagnosis: L MCA CVA   History of Present Illness: Pt is a 73 y/o male with PMH of HTN, DVT/PE, bladder cancer s/p nephrostomy who was admitted to [facility] on 11/20/23 with R hemiparesis, L gaze preference, and aphasia.  Pt on eliquis  having recently switched from xarelto .  CT head showed no abnormality, CTA  head/neck showed terminal L ICA occlusion with poor opacification of L MCA branches.  NIHSS was 24 on neurology consult.  He was not a candidate for TNK due to eliquis , but did proceed for thrombectomy with IR.  After working with PT on 8/4 pt had sudden change in status, and stat CTA/profusion scan ordered which demonstrated L MCA dissection so was taken for urgent thrombectomy which did not result in any improvement in functional status.  Stroke workup revealed MRI confirmed L MCA strokes as well as R occipital lobe and R frontal white matter, age indeterminate DVTs in LEs (see report).  Hospital course complicated by ABLA requiring 2u of blood.  Eliquis  to be resumed once hgb stable.  Therapy ongoing and he was recommended for CIR.    Complete NIHSS TOTAL: 7   Patient's medical record from [facility] has been reviewed by the rehabilitation admission coordinator and physician.   Past Medical History      Past Medical History:  Diagnosis Date   Abnormal stress test minimal  ischemia involving apex 05/01/2019   Bladder cancer (HCC)     Dilated cardiomyopathy (HCC)     Essential hypertension 12/18/2015   History of DVT (deep vein thrombosis) 04/22/2017   History of pulmonary embolism 12/18/2015   Laryngeal cancer (HCC) squamous cell 02/25/2020   PC (prostate cancer) (HCC) 08/28/2019          Has the patient had major surgery during 100 days prior to admission? Yes   Family History   family history includes Heart failure in his mother; Other in his father; Pleurisy in his mother.   Current Medications  Current Medications    Current Facility-Administered Medications:    acetaminophen  (TYLENOL ) tablet 650 mg, 650 mg, Oral, Q4H PRN **OR** acetaminophen  (TYLENOL ) 160 MG/5ML solution 650 mg, 650 mg, Per Tube, Q4H PRN **OR** acetaminophen  (TYLENOL ) suppository 650 mg, 650 mg, Rectal, Q4H PRN, Lehner, Erin C, NP, 650 mg at 11/22/23 1035   [START ON 11/25/2023] apixaban  (ELIQUIS ) tablet 5 mg, 5  mg, Oral, BID, Williams, Jessica N, NP   aspirin  EC tablet 81 mg, 81 mg, Oral, Daily, Jerri Pfeiffer, MD, 81 mg at 11/24/23 0803   atorvastatin  (LIPITOR ) tablet 80 mg, 80 mg, Oral, Daily, Waddell Aquas A, NP, 80 mg at 11/24/23 0803   cefTRIAXone  (ROCEPHIN ) 1 g in sodium chloride  0.9 % 100 mL IVPB, 1 g, Intravenous, Q24H, Jerri Pfeiffer, MD, Last Rate: 200 mL/hr at 11/24/23 9167, Infusion Verify at 11/24/23 9167   Chlorhexidine  Gluconate Cloth 2 % PADS 6 each, 6 each, Topical, Daily, Michaela Aisha SQUIBB, MD, 6 each at 11/24/23 0803   feeding supplement (ENSURE PLUS HIGH PROTEIN) liquid 237 mL, 237 mL, Oral, BID BM, Jerri Pfeiffer, MD   labetalol  (NORMODYNE ) injection 10 mg, 10 mg, Intravenous, Q2H PRN, Lehner, Erin C, NP   multivitamin with minerals tablet 1 tablet, 1 tablet, Oral, Daily, Jerri Pfeiffer, MD, 1 tablet at 11/24/23 0803   Oral care mouth rinse, 15 mL, Mouth Rinse, PRN, Jerri Pfeiffer, MD   senna-docusate (Senokot-S) tablet 1 tablet, 1 tablet, Oral, QHS PRN, Lehner, Erin C, NP   thiamine  (VITAMIN B1) tablet 100 mg, 100 mg, Oral, Daily, Jerri Pfeiffer, MD, 100 mg at 11/24/23 0803     Patients Current Diet:  Diet Order                  Diet regular Room service appropriate? Yes with Assist; Fluid consistency: Thin  Diet effective now                         Precautions / Restrictions Precautions Precautions: Fall, Other (comment) Precaution/Restrictions Comments: bilat nephrostomy bags Restrictions Weight Bearing Restrictions Per Provider Order: No    Has the patient had 2 or more falls or a fall with injury in the past year? No   Prior Activity Level Community (5-7x/wk): independent, driving, no DME at baseline   Prior Functional Level Self Care: Did the patient need help bathing, dressing, using the toilet or eating? Independent   Indoor Mobility: Did the patient need assistance with walking from room to room (with or without device)? Independent   Stairs: Did the patient need  assistance with internal or external stairs (with or without device)? Independent   Functional Cognition: Did the patient need help planning regular tasks such as shopping or remembering to take medications? Independent   Patient Information Are you of Hispanic, Latino/a,or Spanish origin?: A. No, not of Hispanic, Latino/a, or Spanish origin What is  your race?: A. White Do you need or want an interpreter to communicate with a doctor or health care staff?: 0. No   Patient's Response To:  Health Literacy and Transportation Is the patient able to respond to health literacy and transportation needs?: Yes Health Literacy - How often do you need to have someone help you when you read instructions, pamphlets, or other written material from your doctor or pharmacy?: Never In the past 12 months, has lack of transportation kept you from medical appointments or from getting medications?: No In the past 12 months, has lack of transportation kept you from meetings, work, or from getting things needed for daily living?: No   Journalist, newspaper / Equipment Home Equipment: Cane - single point, Toilet riser, Agricultural consultant (2 wheels), Rollator (4 wheels), Wheelchair - manual, Shower seat   Prior Device Use: Indicate devices/aids used by the patient prior to current illness, exacerbation or injury? None of the above   Current Functional Level Cognition   Overall Cognitive Status: Within Functional Limits for tasks assessed Orientation Level: Oriented to person, Oriented to place, Oriented to situation, Disoriented to time    Extremity Assessment (includes Sensation/Coordination)   Upper Extremity Assessment: RUE deficits/detail, LUE deficits/detail RUE Deficits / Details: shoulder AROM to 90*, mild edema, impaired fine motor coordination, 3/5 grasp RUE Coordination: decreased fine motor, decreased gross motor LUE Deficits / Details: mild edema, incr time to motor plan basic movements thumbs up  peace sign ok sign LUE Coordination: decreased fine motor  Lower Extremity Assessment: Defer to PT evaluation RLE Deficits / Details: ROM WFL, grossly 4/5     ADLs   Overall ADL's : Needs assistance/impaired Eating/Feeding: Minimal assistance, Sitting Grooming: Minimal assistance, Sitting Upper Body Bathing: Moderate assistance, Sitting Lower Body Bathing: Moderate assistance, Sitting/lateral leans, Sit to/from stand Upper Body Dressing : Moderate assistance, Standing, Sitting Lower Body Dressing: Moderate assistance Toilet Transfer: Minimal assistance, Ambulation, Regular Toilet Toileting- Clothing Manipulation and Hygiene: Contact guard assist, Sitting/lateral lean Functional mobility during ADLs: Minimal assistance     Mobility   Overal bed mobility: Needs Assistance Bed Mobility: Supine to Sit Supine to sit: Min assist, Used rails, HOB elevated General bed mobility comments: increased time,cues for sequencing     Transfers   Overall transfer level: Needs assistance Equipment used: Rolling walker (2 wheels) Transfers: Sit to/from Stand, Bed to chair/wheelchair/BSC Sit to Stand: Min assist Bed to/from chair/wheelchair/BSC transfer type:: Step pivot Step pivot transfers: Mod assist General transfer comment: Assist to maintain balance and manage RW. Initial assist to place R hand but able to grip RW throughout transfer. Cues for sequencing and posture. No knee buckling noted. Pt with hesitant, guarded steps.     Ambulation / Gait / Stairs / Wheelchair Mobility   Ambulation/Gait Ambulation/Gait assistance: Clinical research associate (Feet): 3 Feet Assistive device: IV Pole Gait Pattern/deviations: Step-to pattern General Gait Details: pt with slowed step-to gait, increased trunk flexion with BUE support of IV pole Gait velocity: reduced Gait velocity interpretation: <1.31 ft/sec, indicative of household ambulator     Posture / Balance Balance Overall balance  assessment: Needs assistance Sitting-balance support: Feet supported, No upper extremity supported Sitting balance-Leahy Scale: Good Standing balance support: Bilateral upper extremity supported, During functional activity, Reliant on assistive device for balance Standing balance-Leahy Scale: Poor     Special considerations/life events  N/a    Previous Home Environment (from acute therapy documentation) Living Arrangements: Spouse/significant other  Lives With: Spouse Available Help at Discharge:  Family, Available 24 hours/day Type of Home: House Home Layout: One level Home Access: Stairs to enter Entrance Stairs-Rails: Can reach both Entrance Stairs-Number of Steps: 3 Home Care Services: No   Discharge Living Setting Plans for Discharge Living Setting: Patient's home, Lives with (comment) (spouse) Type of Home at Discharge: House Discharge Home Layout: One level Discharge Home Access: Stairs to enter Entrance Stairs-Rails: Can reach both Entrance Stairs-Number of Steps: 3 Discharge Bathroom Shower/Tub: Walk-in shower, Tub/shower unit Discharge Bathroom Toilet: Handicapped height Discharge Bathroom Accessibility: Yes How Accessible: Accessible via walker Does the patient have any problems obtaining your medications?: No   Social/Family/Support Systems Patient Roles: Spouse Anticipated Caregiver: Allen Macdonald (spouse) Anticipated Caregiver's Contact Information: [phone] Ability/Limitations of Caregiver: per chart, Allen Macdonald is legally blind; however she is able to see, just not well enough to drive. Caregiver Availability: 24/7 Discharge Plan Discussed with Primary Caregiver: Yes Is Caregiver In Agreement with Plan?: Yes Does Caregiver/Family have Issues with Lodging/Transportation while Pt is in Rehab?: No   Goals Patient/Family Goal for Rehab: PT/OT supervision to min assist; SLP supervision Expected length of stay: 12-16 days Additional Information: Discharge plan: expect  discharge back to pt's home with spouse who can provide 24/7 supervision/min assist Pt/Family Agrees to Admission and willing to participate: Yes Program Orientation Provided & Reviewed with Pt/Caregiver Including Roles  & Responsibilities: Yes   Decrease burden of Care through IP rehab admission: no   Possible need for SNF placement upon discharge: Not anticipated.  Discharge plan: home with spouse who can provide 24/7 supervision to min assist.    Patient Condition: I have reviewed medical records from [facility], spoken with CM, and patient and spouse. I met with patient at the bedside and discussed via phone for inpatient rehabilitation assessment.  Patient will benefit from ongoing PT, OT, and SLP, can actively participate in 3 hours of therapy a day 5 days of the week, and can make measurable gains during the admission.  Patient will also benefit from the coordinated team approach during an Inpatient Acute Rehabilitation admission.  The patient will receive intensive therapy as well as Rehabilitation physician, nursing, social worker, and care management interventions.  Due to safety, skin/wound care, disease management, medication administration, pain management, and patient education the patient requires 24 hour a day rehabilitation nursing.  The patient is currently min to mod assist with mobility and basic ADLs.  Discharge setting and therapy post discharge at home with home health is anticipated.  Patient has agreed to participate in the Acute Inpatient Rehabilitation Program and will admit today.   Preadmission Screen Completed By:  Reche FORBES Lowers, PT, DPT 11/24/2023 1:05 PM ______________________________________________________________________   Discussed status with Dr. Urbano on 11/25/23  at 11:02 AM  and received approval for admission today.   Admission Coordinator:  Jamaria Amborn E Gasper Hopes, PT, DPT time 11:02 AM Pattricia 11/25/23     Assessment/Plan: Diagnosis: L MCA CVA Does the need  for close, 24 hr/day Medical supervision in concert with the patient's rehab needs make it unreasonable for this patient to be served in a less intensive setting? Yes Co-Morbidities requiring supervision/potential complications: Hx of DVT/PE, HTN, HLD< AKI, UTI, Bladder cancer,  Due to bladder management, bowel management, safety, skin/wound care, disease management, medication administration, pain management, and patient education, does the patient require 24 hr/day rehab nursing? Yes Does the patient require coordinated care of a physician, rehab nurse, PT, OT, and SLP to address physical and functional deficits in the context of the above medical  diagnosis(es)? Yes Addressing deficits in the following areas: balance, endurance, locomotion, strength, transferring, bowel/bladder control, bathing, dressing, feeding, grooming, toileting, cognition, speech, language, swallowing, and psychosocial support Can the patient actively participate in an intensive therapy program of at least 3 hrs of therapy 5 days a week? Yes The potential for patient to make measurable gains while on inpatient rehab is excellent Anticipated functional outcomes upon discharge from inpatient rehab: supervision and min assist PT, supervision and min assist OT, supervision SLP Estimated rehab length of stay to reach the above functional goals is: 12-16 Anticipated discharge destination: Home 10. Overall Rehab/Functional Prognosis: good     MD Signature: Murray Collier          Revision History

## 2023-11-25 NOTE — Progress Notes (Signed)
 Physical Therapy Treatment Patient Details Name: Allen Macdonald MRN: [id number] DOB: 1951/04/08 Today's Date: 11/25/2023   History of Present Illness 73 y.o. male [facility] to [facility] hospital on [facility]/20/2023 with R weakness, L gaze and expressive aphasia. CTA with L ICA occlusion, pt underwent thrombectomy on 8/3. Pt with neuro change on 8/4, found to have L M1 occlusion and underwent subsequent thrombectomy and found to have L MCA dissection. PMH includes HTN, DVT/PE, bladder CA s/p nephrostomy..    PT Comments  Pt received in supine and agreeable to session. Pt demonstrates good progress towards functional mobility goals this session. Pt able to tolerate increased standing tolerance to adjust nephrostomy bags and increased gait distance with one seated rest break. Pt demonstrates some impaired awareness due to pt shaking from fatigue and requiring cues to take a seated rest break. Pt able to improve step length and fluidity with progressed distance. Pt demonstrates some difficulty and frustration with word finding at times, but is able to improve some with increased time. Pt motivated to increase functional independence and will benefit from intensive inpatient follow-up therapy, >3 hours/day to maximize progress prior to return home. Acutely, pt continues to benefit from PT services to progress toward functional mobility goals.    If plan is discharge home, recommend the following: A little help with walking and/or transfers;A lot of help with bathing/dressing/bathroom;Assistance with cooking/housework;Direct supervision/assist for medications management;Direct supervision/assist for financial management;Assist for transportation;Help with stairs or ramp for entrance;Supervision due to cognitive status   Can travel by private vehicle        Equipment Recommendations  Rolling walker (2 wheels);BSC/3in1    Recommendations for Other Services       Precautions / Restrictions Precautions Precautions:  Fall;Other (comment) Recall of Precautions/Restrictions: Impaired Precaution/Restrictions Comments: bilat nephrostomy bags Restrictions Weight Bearing Restrictions Per Provider Order: No     Mobility  Bed Mobility Overal bed mobility: Needs Assistance Bed Mobility: Rolling, Sidelying to Sit Rolling: Min assist, +2 for physical assistance Sidelying to sit: Min assist, +2 for physical assistance       General bed mobility comments: increased time,cues for sequencing. Assist for trunk elevation    Transfers Overall transfer level: Needs assistance Equipment used: Rolling walker (2 wheels) Transfers: Sit to/from Stand Sit to Stand: Min assist           General transfer comment: Assist to maintain balance and manage RW. Initial assist to place R hand but able to grip RW throughout transfer. Cues for sequencing and posture. No knee buckling noted.    Ambulation/Gait Ambulation/Gait assistance: Contact guard assist Gait Distance (Feet): 40 Feet (x2) Assistive device: Rolling walker (2 wheels) Gait Pattern/deviations: Step-to pattern, Step-through pattern, Trunk flexed, Decreased stride length Gait velocity: reduced     General Gait Details: Pt demonstrates slow, guarded steps with cues for RW management and upright posture. Pt requires 1 seated rest break due to fatigue and shaking. Pt demonstrates improved step length and fluidity during second trial   Stairs             Wheelchair Mobility     Tilt Bed    Modified Rankin (Stroke Patients Only) Modified Rankin (Stroke Patients Only) Pre-Morbid Rankin Score: No symptoms Modified Rankin: Moderately severe disability     Balance Overall balance assessment: Needs assistance Sitting-balance support: Feet supported, No upper extremity supported Sitting balance-Leahy Scale: Good     Standing balance support: Bilateral upper extremity supported, During functional activity, Reliant on assistive device for  balance Standing  balance-Leahy Scale: Poor Standing balance comment: with RW support                            Communication Communication Communication: Impaired Factors Affecting Communication: Difficulty expressing self  Cognition Arousal: Alert Behavior During Therapy: Flat affect   PT - Cognitive impairments: Awareness, Attention, Problem solving, Sequencing                         Following commands: Impaired Following commands impaired: Follows multi-step commands inconsistently, Only follows one step commands consistently, Follows one step commands with increased time    Cueing Cueing Techniques: Verbal cues, Gestural cues, Tactile cues  Exercises      General Comments        Pertinent Vitals/Pain Pain Assessment Pain Assessment: No/denies pain     PT Goals (current goals can now be found in the care plan section) Acute Rehab PT Goals Patient Stated Goal: to return to independence PT Goal Formulation: With patient Time For Goal Achievement: 12/07/23 Progress towards PT goals: Progressing toward goals    Frequency    Min 3X/week      PT Plan      Co-evaluation PT/OT/SLP Co-Evaluation/Treatment: Yes Reason for Co-Treatment: Complexity of the patient's impairments (multi-system involvement);For patient/therapist safety;To address functional/ADL transfers PT goals addressed during session: Mobility/safety with mobility;Balance;Strengthening/ROM        AM-PAC PT 6 Clicks Mobility   Outcome Measure  Help needed turning from your back to your side while in a flat bed without using bedrails?: A Little Help needed moving from lying on your back to sitting on the side of a flat bed without using bedrails?: A Little Help needed moving to and from a bed to a chair (including a wheelchair)?: A Little Help needed standing up from a chair using your arms (e.g., wheelchair or bedside chair)?: A Little Help needed to walk in hospital  room?: A Lot Help needed climbing 3-5 steps with a railing? : Total 6 Click Score: 15    End of Session Equipment Utilized During Treatment: Gait belt Activity Tolerance: Patient tolerated treatment well Patient left: in chair;with chair alarm set;with call bell/phone within reach Nurse Communication: Mobility status PT Visit Diagnosis: Other abnormalities of gait and mobility (R26.89);Muscle weakness (generalized) (M62.81)     Time: 9153-9083 PT Time Calculation (min) (ACUTE ONLY): 30 min  Charges:    $Gait Training: 8-22 mins PT General Charges $$ ACUTE PT VISIT: 1 Visit                     Darryle George, PTA Acute Rehabilitation Services Secure Chat Preferred  [phone]    Darryle George 11/25/2023, 11:27 AM

## 2023-11-25 NOTE — Progress Notes (Signed)
 Inpatient Rehabilitation Admission Medication Review by a Pharmacist  A complete drug regimen review was completed for this patient to identify any potential clinically significant medication issues.  High Risk Drug Classes Is patient taking? Indication by Medication  Antipsychotic Yes Compazine  prn N/V  Anticoagulant Yes Apixaban  - CVA  Antibiotic Yes IV ceftriaxone  - UTI, 7 day course  Opioid No   Antiplatelet No   Hypoglycemics/insulin No   Vasoactive Medication Yes Amlodipine  - HTN  Chemotherapy No   Other Yes Trazodone  prn sleep Atorvastatin  - HLD MVI, thiamine  - supplement     Type of Medication Issue Identified Description of Issue Recommendation(s)  Drug Interaction(s) (clinically significant)     Duplicate Therapy     Allergy      No Medication Administration End Date     Incorrect Dose     Additional Drug Therapy Needed     Significant med changes from prior encounter (inform family/care partners about these prior to discharge).    Other       Clinically significant medication issues were identified that warrant physician communication and completion of prescribed/recommended actions by midnight of the next day:  No  Name of provider notified for urgent issues identified:   Provider Method of Notification:     Pharmacist comments: None  Time spent performing this drug regimen review (minutes):  20 minutes  Thank you. Olam Monte, PharmD

## 2023-11-25 NOTE — H&P (Signed)
 Physical Medicine and Rehabilitation Admission H&P    Chief Complaint  Patient presents with   Debility due to CVA   : HPI: Allen Macdonald is a 73 year old male with PMHx of HTN, DVT/PE on Eliquis , bladder cancer s/p nephrostomy. Per chart review he had taken Eliquis  2.5 mg at bedtime, around 0815 he was unable to get up from the toilet and became mute. He presented to [facility] via EMS for right sided weakness, left gaze and aphasia. Code stroke was activated upon arrival. Ct head showed no intracranial abnormality. Neurology consulted, CTA head/neck demonstrated a terminal left ICA occlusion with poor opacification of the left MCA branches. He was not a candidate for TNK with previous Eliquis  use. On 8/3 patient underwent emergent stent retriever assisted mechanical thrombectomy of left carotid T and M1 occlusions with TICI 2C by Dr. Ray. While working with PT on 8/4 he experienced left gaze, slurred speech and RUE weakness was note and neurology was consulted.  Stat CT head and CTA head and neck with perfusion which revealed left M1 occlusion,Infarct core within the left temporal lobe and penumbra involving the temporal, frontal, and parietal lobe. Code IR was activated and he underwent M1 Thrombectomy due to dissection of left M1 with TICI 2B vascularization, cangrelor  drip was initiated. Post intervention pt still have no improvement with right arm flaccidity and aphasia. Repeat CTA head/neck showed no LVO. US  LE positive for age indeterminate DVTs. He recevievd 2 units PRBC for ABLA and Eliquis  was resumed as hgb stabilized.  Pt reports strength gradually improving.  He sometimes uses compression hose for LE swelling at home.  Prior to arrival patient was independent with use of home equipment at home. Currently he requires min assist for functional mobility, mod assist with RW with cues for sequencing and posture, and min assist for ADLs. Therapy evaluations completed due to patient  decreased functional mobility was admitted for a comprehensive rehab program.   Review of Systems  Constitutional:  Negative for chills and fever.  HENT:  Negative for hearing loss.   Eyes:  Negative for blurred vision and double vision.  Cardiovascular:  Positive for leg swelling. Negative for chest pain.  Gastrointestinal:  Negative for abdominal pain, blood in stool, diarrhea, nausea and vomiting.  Genitourinary: Negative.   Musculoskeletal:  Negative for back pain, joint pain and neck pain.  Skin:  Negative for itching and rash.  Neurological:  Positive for weakness. Negative for dizziness and headaches.  Psychiatric/Behavioral:  The patient does not have insomnia.      Past Medical History:  Diagnosis Date   Abnormal stress test minimal ischemia involving apex 05/01/2019   Bladder cancer (HCC)    Dilated cardiomyopathy (HCC)    Essential hypertension 12/18/2015   History of DVT (deep vein thrombosis) 04/22/2017   History of pulmonary embolism 12/18/2015   Laryngeal cancer (HCC) squamous cell 02/25/2020   PC (prostate cancer) (HCC) 08/28/2019   Past Surgical History:  Procedure Laterality Date   CATARACT EXTRACTION Bilateral    INSERTION PROSTATE RADIATION SEED  2003   IR CT HEAD LTD  11/20/2023   IR PERCUTANEOUS ART THROMBECTOMY/INFUSION INTRACRANIAL INC DIAG ANGIO  11/20/2023   IR PERCUTANEOUS ART THROMBECTOMY/INFUSION INTRACRANIAL INC DIAG ANGIO  11/21/2023   IR US  GUIDE VASC ACCESS RIGHT  11/20/2023   IR US  GUIDE VASC ACCESS RIGHT  11/21/2023   PROSTATE SURGERY     RADIOLOGY WITH ANESTHESIA N/A 11/20/2023   Procedure: RADIOLOGY WITH ANESTHESIA;  Surgeon: Radiologist, Medication, MD;  Location: [facility] OR;  Service: Radiology;  Laterality: N/A;   RADIOLOGY WITH ANESTHESIA N/A 11/21/2023   Procedure: RADIOLOGY WITH ANESTHESIA;  Surgeon: Radiologist, Medication, MD;  Location: [facility] OR;  Service: Radiology;  Laterality: N/A;   TONSILLECTOMY     Venacava Filter     WISDOM TOOTH EXTRACTION      Family History  Problem Relation Age of Onset   Heart failure Mother    Pleurisy Mother    Other Father        UNK MEDICAL HX   Social History:  reports that he has never smoked. He has never used smokeless tobacco. He reports current alcohol use of about 14.0 standard drinks of alcohol per week. He reports that he does not use drugs. Allergies:  Allergies  Allergen Reactions   Bee Venom Anaphylaxis   Medications Prior to Admission  Medication Sig Dispense Refill   amLODipine  (NORVASC ) 5 MG tablet Take 5 mg by mouth daily.     apixaban  (ELIQUIS ) 2.5 MG TABS tablet Take 2.5 mg by mouth 2 (two) times daily.     bicalutamide  (CASODEX ) 50 MG tablet Take 50 mg by mouth daily.     carvedilol  (COREG ) 12.5 MG tablet Take 1 tablet (12.5 mg total) by mouth 2 (two) times daily. 60 tablet 2   denosumab  (PROLIA ) 60 MG/ML SOSY injection Inject 60 mg into the skin every 6 (six) months.     famotidine  (PEPCID ) 20 MG tablet Take 1 tablet (20 mg total) by mouth 2 (two) times daily. (Patient taking differently: Take 20 mg by mouth daily as needed for heartburn or indigestion.) 180 tablet 3   loratadine  (CLARITIN ) 10 MG tablet Take 1 tablet (10 mg total) by mouth 2 (two) times daily. 180 tablet 3   omeprazole  (PRILOSEC) 40 MG capsule TAKE 1 CAPSULE BY MOUTH EVERY MORNING (Patient taking differently: Take 40 mg by mouth daily as needed (Heartburn).) 30 capsule 5      Home: Home Living Family/patient expects to be discharged to:: Private residence Living Arrangements: Spouse/significant other Available Help at Discharge: Family, Available 24 hours/day Type of Home: House Home Access: Stairs to enter [employer] of Steps: 3 Entrance Stairs-Rails: Can reach both Home Layout: One level Home Equipment: Cane - single point, Toilet riser, Agricultural consultant (2 wheels), Rollator (4 wheels), Wheelchair - manual, Shower seat  Lives With: Spouse   Functional History: Prior Function Prior Level  of Function : Independent/Modified Independent, Driving  Functional Status:  Mobility: Bed Mobility Overal bed mobility: Needs Assistance Bed Mobility: Rolling, Sidelying to Sit Rolling: Min assist, +2 for physical assistance Sidelying to sit: Min assist, +2 for physical assistance Supine to sit: Min assist, Used rails, HOB elevated General bed mobility comments: increased time,cues for sequencing. Assist for trunk elevation Transfers Overall transfer level: Needs assistance Equipment used: Rolling walker (2 wheels) Transfers: Sit to/from Stand Sit to Stand: Min assist Bed to/from chair/wheelchair/BSC transfer type:: Step pivot Step pivot transfers: Mod assist General transfer comment: Assist to maintain balance and manage RW. Initial assist to place R hand but able to grip RW throughout transfer. Cues for sequencing and posture. No knee buckling noted. Ambulation/Gait Ambulation/Gait assistance: Contact guard assist Gait Distance (Feet): 40 Feet (x2) Assistive device: Rolling walker (2 wheels) Gait Pattern/deviations: Step-to pattern, Step-through pattern, Trunk flexed, Decreased stride length General Gait Details: Pt demonstrates slow, guarded steps with cues for RW management and upright posture. Pt requires 1 seated rest break due to fatigue  and shaking. Pt demonstrates improved step length and fluidity during second trial Gait velocity: reduced Gait velocity interpretation: <1.31 ft/sec, indicative of household ambulator    ADL: ADL Overall ADL's : Needs assistance/impaired Eating/Feeding: Sitting, Set up, Cueing for sequencing, Cueing for compensatory techinques Eating/Feeding Details (indicate cue type and reason): cues for use of R hand to stabalize container while L hand pulled the lid open, along with opening package on silverware Grooming: Set up, Standing, Wash/dry face, Cueing for sequencing Grooming Details (indicate cue type and reason): cues for thoroughness to  reach whole face not just L side Upper Body Bathing: Moderate assistance, Sitting Lower Body Bathing: Moderate assistance, Sitting/lateral leans, Sit to/from stand Upper Body Dressing : Moderate assistance, Standing, Sitting Lower Body Dressing: Maximal assistance, Sitting/lateral leans Lower Body Dressing Details (indicate cue type and reason): able to cross L leg over knee to reach sock but unable to cross R leg over L knee Toilet Transfer: Minimal assistance, Ambulation, Regular Toilet, +2 for physical assistance, Rolling walker (2 wheels) Toilet Transfer Details (indicate cue type and reason): in room ambulation with PTA also, including multiple transfers to/from recliner that simulated toileting along with the distance Toileting- Clothing Manipulation and Hygiene: Contact guard assist, Sitting/lateral lean Functional mobility during ADLs: Minimal assistance  Cognition: Cognition Overall Cognitive Status: Within Functional Limits for tasks assessed Orientation Level: Oriented to situation, Disoriented to person, Disoriented to place, Disoriented to time Cognition Arousal: Alert Behavior During Therapy: Flat affect Overall Cognitive Status: Within Functional Limits for tasks assessed  Physical Exam: Blood pressure 118/65, pulse 91, temperature 97.9 F (36.6 C), temperature source Oral, resp. rate 17, height 5' 10 (1.778 m), weight 104.5 kg, SpO2 100%. Physical Exam  General: No apparent distress HEENT: Head is normocephalic, atraumatic, sclera anicteric, oral mucosa pink and moist Neck: Supple without JVD or lymphadenopathy Heart: Reg rate and rhythm. No murmurs rubs or gallops, port R chest  Chest: CTA bilaterally without wheezes, rales, or rhonchi; no distress Abdomen: Soft, non-tender, non-distended, bowel sounds positive. B/l nephrostomy tubes with yellow urine Extremities: 1+ LE edema- chronic Psych: Pt's affect is a little flat  Skin: R groin site with Dry dressing,   Neuro:     Mental Status: AAOx person, place, not date. Able to answer situation with choices. Delayed responses  Speech/Languate: + Aphasia. Word finding difficulties. Mild dysarthria. Naming and repetition intact, follows simple commands CRANIAL NERVES: II: PERRL. Visual fields full III, IV, VI: EOM intact, no gaze preference or deviation V: normal sensation bilaterally VII: Slight right facial weakness VIII: normal hearing to speech IX, X: normal palatal elevation XI: head turn and shoulder shrug decreased on R XII: Tongue midline     MOTOR: RUE: 3/5 Deltoid, 4-/5 Biceps, 4-/5 Triceps,4-/5 Grip LUE: 5/5 Deltoid, 4+/5 Biceps, 4+/5 Triceps, 5/5 Grip RLE: HF 4/5, KE 4/5, ADF 4/5, APF 4/5 LLE: HF 5/5, KE 5/5, ADF 5/5, APF 5/5   SENSORY: Normal to touch all 4 extremities   Coordination: Normal finger to nose intact b/l, slow on R   Increased tone R biceps  Results for orders placed or performed during the hospital encounter of 11/20/23 (from the past 48 hours)  Glucose, capillary     Status: Abnormal   Collection Time: 11/23/23  5:01 PM  Result Value Ref Range   Glucose-Capillary 111 (H) 70 - 99 mg/dL    Comment: Glucose reference range applies only to samples taken after fasting for at least 8 hours.  CBC     Status:  Abnormal   Collection Time: 11/23/23  9:10 PM  Result Value Ref Range   WBC 7.1 4.0 - 10.5 K/uL   RBC 3.24 (L) 4.22 - 5.81 MIL/uL   Hemoglobin 9.5 (L) 13.0 - 17.0 g/dL    Comment: REPEATED TO VERIFY POST TRANSFUSION SPECIMEN    HCT 29.3 (L) 39.0 - 52.0 %   MCV 90.4 80.0 - 100.0 fL   MCH 29.3 26.0 - 34.0 pg   MCHC 32.4 30.0 - 36.0 g/dL   RDW 81.2 (H) 88.4 - 84.4 %   Platelets 145 (L) 150 - 400 K/uL   nRBC 0.0 0.0 - 0.2 %    Comment: Performed at [facility], [address] [place], [place] [address]  Glucose, capillary     Status: Abnormal   Collection Time: 11/23/23  9:41 PM  Result Value Ref Range   Glucose-Capillary 124 (H) 70 - 99 mg/dL     Comment: Glucose reference range applies only to samples taken after fasting for at least 8 hours.  CBC     Status: Abnormal   Collection Time: 11/24/23  5:53 AM  Result Value Ref Range   WBC 6.8 4.0 - 10.5 K/uL   RBC 3.24 (L) 4.22 - 5.81 MIL/uL   Hemoglobin 9.7 (L) 13.0 - 17.0 g/dL   HCT 71.0 (L) 60.9 - 47.9 %   MCV 89.2 80.0 - 100.0 fL   MCH 29.9 26.0 - 34.0 pg   MCHC 33.6 30.0 - 36.0 g/dL   RDW 80.6 (H) 88.4 - 84.4 %   Platelets 141 (L) 150 - 400 K/uL   nRBC 0.0 0.0 - 0.2 %    Comment: Performed at [facility], [address] [place], [place] [address]  Basic metabolic panel with GFR     Status: Abnormal   Collection Time: 11/24/23  5:53 AM  Result Value Ref Range   Sodium 141 135 - 145 mmol/L   Potassium 3.6 3.5 - 5.1 mmol/L   Chloride 114 (H) 98 - 111 mmol/L   CO2 18 (L) 22 - 32 mmol/L   Glucose, Bld 114 (H) 70 - 99 mg/dL    Comment: Glucose reference range applies only to samples taken after fasting for at least 8 hours.   BUN 23 8 - 23 mg/dL   Creatinine, Ser 8.57 (H) 0.61 - 1.24 mg/dL   Calcium  7.9 (L) 8.9 - 10.3 mg/dL   GFR, Estimated 52 (L) >60 mL/min    Comment: (NOTE) Calculated using the CKD-EPI Creatinine Equation (2021)    Anion gap 9 5 - 15    Comment: Performed at [facility], [address] [place], [place] [address]  Magnesium     Status: None   Collection Time: 11/24/23  5:53 AM  Result Value Ref Range   Magnesium 1.9 1.7 - 2.4 mg/dL    Comment: Performed at [facility], [address], [place], [place] [address]  Phosphorus     Status: None   Collection Time: 11/24/23  5:53 AM  Result Value Ref Range   Phosphorus 4.2 2.5 - 4.6 mg/dL    Comment: Performed at [facility], [address] [place], [place] [address]  Glucose, capillary     Status: None   Collection Time: 11/24/23  7:16 AM  Result Value Ref Range   Glucose-Capillary 85 70 - 99 mg/dL    Comment: Glucose reference range applies only to samples taken after  fasting for  at least 8 hours.  PSA     Status: None   Collection Time: 11/24/23 10:24 AM  Result Value Ref Range   Prostatic Specific Antigen 0.01 0.00 - 4.00 ng/mL    Comment: (NOTE) While PSA levels of <=4.00 ng/ml are reported as reference range, some men with levels below 4.00 ng/ml can have prostate cancer and many men with PSA above 4.00 ng/ml do not have prostate cancer.  Other tests such as free PSA, age specific reference ranges, PSA velocity and PSA doubling time may be helpful especially in men less than 76 years old. Performed at [facility], [address] [place], [place] [address]   Glucose, capillary     Status: Abnormal   Collection Time: 11/24/23 11:17 AM  Result Value Ref Range   Glucose-Capillary 122 (H) 70 - 99 mg/dL    Comment: Glucose reference range applies only to samples taken after fasting for at least 8 hours.  Glucose, capillary     Status: Abnormal   Collection Time: 11/24/23  9:58 PM  Result Value Ref Range   Glucose-Capillary 106 (H) 70 - 99 mg/dL    Comment: Glucose reference range applies only to samples taken after fasting for at least 8 hours.  CBC     Status: Abnormal   Collection Time: 11/25/23  4:15 AM  Result Value Ref Range   WBC 6.3 4.0 - 10.5 K/uL   RBC 3.36 (L) 4.22 - 5.81 MIL/uL   Hemoglobin 9.9 (L) 13.0 - 17.0 g/dL   HCT 69.9 (L) 60.9 - 47.9 %   MCV 89.3 80.0 - 100.0 fL   MCH 29.5 26.0 - 34.0 pg   MCHC 33.0 30.0 - 36.0 g/dL   RDW 81.0 (H) 88.4 - 84.4 %   Platelets 143 (L) 150 - 400 K/uL   nRBC 0.0 0.0 - 0.2 %    Comment: Performed at [facility], [address] [place], [place] [address]  Basic metabolic panel with GFR     Status: Abnormal   Collection Time: 11/25/23  4:15 AM  Result Value Ref Range   Sodium 147 (H) 135 - 145 mmol/L   Potassium 3.6 3.5 - 5.1 mmol/L   Chloride 113 (H) 98 - 111 mmol/L   CO2 21 (L) 22 - 32 mmol/L   Glucose, Bld 115 (H) 70 - 99 mg/dL    Comment: Glucose reference range applies  only to samples taken after fasting for at least 8 hours.   BUN 20 8 - 23 mg/dL   Creatinine, Ser 8.47 (H) 0.61 - 1.24 mg/dL   Calcium  8.2 (L) 8.9 - 10.3 mg/dL   GFR, Estimated 47 (L) >60 mL/min    Comment: (NOTE) Calculated using the CKD-EPI Creatinine Equation (2021)    Anion gap 13 5 - 15    Comment: Performed at [facility], [address] [place], [place] [address]  Glucose, capillary     Status: Abnormal   Collection Time: 11/25/23  6:30 AM  Result Value Ref Range   Glucose-Capillary 117 (H) 70 - 99 mg/dL    Comment: Glucose reference range applies only to samples taken after fasting for at least 8 hours.   Comment 1 Notify RN   Glucose, capillary     Status: Abnormal   Collection Time: 11/25/23 11:42 AM  Result Value Ref Range   Glucose-Capillary 148 (H) 70 - 99 mg/dL    Comment: Glucose reference range applies only to samples taken  after fasting for at least 8 hours.   No results found.    Blood pressure 118/65, pulse 91, temperature 97.9 F (36.6 C), temperature source Oral, resp. rate 17, height 5' 10 (1.778 m), weight 104.5 kg, SpO2 100%.  Medical Problem List and Plan: 1. Functional deficits secondary to left MCA scattered infarcts with left ICA and left MCA occlusion s/p IR with TICI2c likely due to hypercoagulable state in setting of cancer  -patient may shower, cover incision/nephrostomy tubes  -ELOS/Goals: 12-16 days, PT/OT sup to min A, SLP supervision  -Admit to CIR 2.  Antithrombotics: -DVT/anticoagulation:  Mechanical: Sequential compression devices, below knee Bilateral lower extremities Pharmaceutical: Eliquis   -antiplatelet therapy: N/A 3. Pain Management: Tylenol  prn  4. Mood/Behavior/Sleep: LCSW to follow for evaluation and support when available.   -antipsychotic agents: N/A 5. Neuropsych/cognition: This patient may be capable of making decisions on his own behalf. 6. Skin/Wound Care: routine pressure relief measures  7.  Fluids/Electrolytes/Nutrition: Monitor I/O   - Per speech past swallow study continue regular diet w/ Ensure and multivitamin supplements 8.  Bladder Cancer s/p nephrostomy tubes: stage IV, metastatic to the bone by MRI of the spine on 11/08/2023.  -UTI: Ceftriaxone  ends 8/10 -Hx of prostate CA, on androgen therapy   9. AKI: Monitor BMP w/GFR  Cr 1.22--1.42--1.52. Monitor fluid intake 10.  Anemia: Hemoglobin stable post PRBC 2 units Hgb 9.9  11. HTN: hx of orthostasis on 8/4 resolved after IV fluids--home amlodipine  5 mg resumed--Carvdilol 12.5 mg not resumed - resume as needed. Avoid hypotension 12. DVT: hx of multiple BL DVT and PE had IVC filter placed 2014 --resumed Eliquis    -US  LE positive for age indeterminate DVTs: RT - PopV and PTV LT - CFV, PFV, FV, PopV, Gastrocs  13. HLD: LDL 112, goal < 70 atorvastatin  40 mg  14. Obesity: Educate on diet and weight loss to promote overall health and mobility.  Body mass index is 33.06 kg/m. 15. Hx of laryngeal cancer s/p radiation therapy   Brandi L Leak, NP 11/25/2023  I have personally performed a face to face diagnostic evaluation of this patient and formulated the key components of the plan.  Additionally, I have personally reviewed laboratory data, imaging studies, as well as relevant notes and concur with the physician assistant's documentation above.  The patient's status has not changed from the original H&P.  Any changes in documentation from the acute care chart have been noted above.  Murray Collier, MD

## 2023-11-25 NOTE — Progress Notes (Signed)
 Inpatient Rehab Admissions Coordinator:    I have insurance approval and a bed available for pt to admit to CIR today. Dr. Jerri in agreement and [facility] aware.  I've notified pt/family and they are in agreement to admit today.  I will make arrangements.    Reche Lowers, PT, DPT Admissions Coordinator [phone] 11/25/23  11:07 AM

## 2023-11-25 NOTE — Plan of Care (Signed)
  Problem: Nutrition: Goal: Risk of aspiration will decrease Outcome: Progressing Goal: Dietary intake will improve Outcome: Progressing   Problem: Clinical Measurements: Goal: Ability to maintain clinical measurements within normal limits will improve Outcome: Progressing Goal: Will remain free from infection Outcome: Progressing Goal: Diagnostic test results will improve Outcome: Progressing Goal: Respiratory complications will improve Outcome: Progressing Goal: Cardiovascular complication will be avoided Outcome: Progressing   Problem: Activity: Goal: Risk for activity intolerance will decrease Outcome: Progressing   Problem: Elimination: Goal: Will not experience complications related to bowel motility Outcome: Progressing Goal: Will not experience complications related to urinary retention Outcome: Progressing   Problem: Safety: Goal: Ability to remain free from injury will improve Outcome: Progressing   Problem: Skin Integrity: Goal: Risk for impaired skin integrity will decrease Outcome: Progressing

## 2023-11-26 DIAGNOSIS — D638 Anemia in other chronic diseases classified elsewhere: Secondary | ICD-10-CM | POA: Diagnosis not present

## 2023-11-26 DIAGNOSIS — I10 Essential (primary) hypertension: Secondary | ICD-10-CM | POA: Diagnosis not present

## 2023-11-26 DIAGNOSIS — I63312 Cerebral infarction due to thrombosis of left middle cerebral artery: Secondary | ICD-10-CM | POA: Diagnosis not present

## 2023-11-26 LAB — COMPREHENSIVE METABOLIC PANEL WITH GFR
ALT: 68 U/L — ABNORMAL HIGH (ref 0–44)
AST: 67 U/L — ABNORMAL HIGH (ref 15–41)
Albumin: 2.4 g/dL — ABNORMAL LOW (ref 3.5–5.0)
Alkaline Phosphatase: 89 U/L (ref 38–126)
Anion gap: 11 (ref 5–15)
BUN: 26 mg/dL — ABNORMAL HIGH (ref 8–23)
CO2: 18 mmol/L — ABNORMAL LOW (ref 22–32)
Calcium: 8.2 mg/dL — ABNORMAL LOW (ref 8.9–10.3)
Chloride: 113 mmol/L — ABNORMAL HIGH (ref 98–111)
Creatinine, Ser: 1.4 mg/dL — ABNORMAL HIGH (ref 0.61–1.24)
GFR, Estimated: 52 mL/min — ABNORMAL LOW (ref >60)
Glucose, Bld: 103 mg/dL — ABNORMAL HIGH (ref 70–99)
Potassium: 4 mmol/L (ref 3.5–5.1)
Sodium: 142 mmol/L (ref 135–145)
Total Bilirubin: 1 mg/dL (ref 0.0–1.2)
Total Protein: 5.4 g/dL — ABNORMAL LOW (ref 6.5–8.1)

## 2023-11-26 MED ORDER — SODIUM CHLORIDE 0.9% FLUSH: 10.0000 mL | INTRAVENOUS | Status: DC | PRN

## 2023-11-26 NOTE — Plan of Care (Signed)
  Problem: RH Comprehension Communication Goal: LTG Patient will comprehend basic/complex auditory (SLP) Description: LTG: Patient will comprehend basic/complex auditory information with cues (SLP). Flowsheets (Taken 11/26/2023 1626) LTG: Patient will comprehend:  Basic auditory information  Complex auditory information LTG: Patient will comprehend auditory information with cueing (SLP): Supervision Note: Mildly complex   Problem: RH Expression Communication Goal: LTG Patient will verbally express basic/complex needs(SLP) Description: LTG:  Patient will verbally express basic/complex needs, wants or ideas with cues  (SLP) Flowsheets (Taken 11/26/2023 1626) LTG: Patient will verbally express basic/complex needs, wants or ideas (SLP): Supervision Goal: LTG Patient will increase word finding of common (SLP) Description: LTG:  Patient will increase word finding of common objects/daily info/abstract thoughts with cues using compensatory strategies (SLP). Flowsheets (Taken 11/26/2023 1626) LTG: Patient will increase word finding of common (SLP): Supervision Patient will use compensatory strategies to increase word finding of:  Daily info  Common objects Note: Mildly complex thoughts/ideas

## 2023-11-26 NOTE — Plan of Care (Signed)
  Problem: RH Balance Goal: LTG Patient will maintain dynamic standing with ADLs (OT) Description: LTG:  Patient will maintain dynamic standing balance with assist during activities of daily living (OT)  Flowsheets (Taken 11/26/2023 1231) LTG: Pt will maintain dynamic standing balance during ADLs with: Contact Guard/Touching assist   Problem: Sit to Stand Goal: LTG:  Patient will perform sit to stand in prep for activites of daily living with assistance level (OT) Description: LTG:  Patient will perform sit to stand in prep for activites of daily living with assistance level (OT) Flowsheets (Taken 11/26/2023 1231) LTG: PT will perform sit to stand in prep for activites of daily living with assistance level: Supervision/Verbal cueing   Problem: RH Grooming Goal: LTG Patient will perform grooming w/assist,cues/equip (OT) Description: LTG: Patient will perform grooming with assist, with/without cues using equipment (OT) Flowsheets (Taken 11/26/2023 1231) LTG: Pt will perform grooming with assistance level of: Supervision/Verbal cueing   Problem: RH Bathing Goal: LTG Patient will bathe all body parts with assist levels (OT) Description: LTG: Patient will bathe all body parts with assist levels (OT) Flowsheets (Taken 11/26/2023 1231) LTG: Pt will perform bathing with assistance level/cueing: Minimal Assistance - Patient > 75%   Problem: RH Dressing Goal: LTG Patient will perform upper body dressing (OT) Description: LTG Patient will perform upper body dressing with assist, with/without cues (OT). Flowsheets (Taken 11/26/2023 1231) LTG: Pt will perform upper body dressing with assistance level of: Supervision/Verbal cueing Goal: LTG Patient will perform lower body dressing w/assist (OT) Description: LTG: Patient will perform lower body dressing with assist, with/without cues in positioning using equipment (OT) Flowsheets (Taken 11/26/2023 1231) LTG: Pt will perform lower body dressing with assistance  level of: Contact Guard/Touching assist   Problem: RH Toileting Goal: LTG Patient will perform toileting task (3/3 steps) with assistance level (OT) Description: LTG: Patient will perform toileting task (3/3 steps) with assistance level (OT)  Flowsheets (Taken 11/26/2023 1231) LTG: Pt will perform toileting task (3/3 steps) with assistance level: Contact Guard/Touching assist   Problem: RH Functional Use of Upper Extremity Goal: LTG Patient will use RT/LT upper extremity as a (OT) Description: LTG: Patient will use right/left upper extremity as a stabilizer/gross assist/diminished/nondominant/dominant level with assist, with/without cues during functional activity (OT) Flowsheets (Taken 11/26/2023 1231) LTG: Use of upper extremity in functional activities: RUE as nondominant level   Problem: RH Toilet Transfers Goal: LTG Patient will perform toilet transfers w/assist (OT) Description: LTG: Patient will perform toilet transfers with assist, with/without cues using equipment (OT) Flowsheets (Taken 11/26/2023 1231) LTG: Pt will perform toilet transfers with assistance level of: Supervision/Verbal cueing   Problem: RH Tub/Shower Transfers Goal: LTG Patient will perform tub/shower transfers w/assist (OT) Description: LTG: Patient will perform tub/shower transfers with assist, with/without cues using equipment (OT) Flowsheets (Taken 11/26/2023 1231) LTG: Pt will perform tub/shower stall transfers with assistance level of: Minimal Assistance - Patient > 75%

## 2023-11-26 NOTE — Plan of Care (Signed)
  Problem: RH Balance Goal: LTG Patient will maintain dynamic standing balance (PT) Description: LTG:  Patient will maintain dynamic standing balance with assistance during mobility activities (PT) Flowsheets (Taken 11/26/2023 1214) LTG: Pt will maintain dynamic standing balance during mobility activities with:: Supervision/Verbal cueing   Problem: Sit to Stand Goal: LTG:  Patient will perform sit to stand with assistance level (PT) Description: LTG:  Patient will perform sit to stand with assistance level (PT) Flowsheets (Taken 11/26/2023 1214) LTG: PT will perform sit to stand in preparation for functional mobility with assistance level: Supervision/Verbal cueing   Problem: RH Bed Mobility Goal: LTG Patient will perform bed mobility with assist (PT) Description: LTG: Patient will perform bed mobility with assistance, with/without cues (PT). Flowsheets (Taken 11/26/2023 1214) LTG: Pt will perform bed mobility with assistance level of: Supervision/Verbal cueing   Problem: RH Bed to Chair Transfers Goal: LTG Patient will perform bed/chair transfers w/assist (PT) Description: LTG: Patient will perform bed to chair transfers with assistance (PT). Flowsheets (Taken 11/26/2023 1214) LTG: Pt will perform Bed to Chair Transfers with assistance level: Supervision/Verbal cueing   Problem: RH Car Transfers Goal: LTG Patient will perform car transfers with assist (PT) Description: LTG: Patient will perform car transfers with assistance (PT). Flowsheets (Taken 11/26/2023 1214) LTG: Pt will perform car transfers with assist:: Supervision/Verbal cueing   Problem: RH Ambulation Goal: LTG Patient will ambulate in controlled environment (PT) Description: LTG: Patient will ambulate in a controlled environment, # of feet with assistance (PT). Flowsheets (Taken 11/26/2023 1214) LTG: Pt will ambulate in controlled environ  assist needed:: Supervision/Verbal cueing LTG: Ambulation distance in controlled  environment: 150' Goal: LTG Patient will ambulate in home environment (PT) Description: LTG: Patient will ambulate in home environment, # of feet with assistance (PT). Flowsheets (Taken 11/26/2023 1214) LTG: Pt will ambulate in home environ  assist needed:: Supervision/Verbal cueing LTG: Ambulation distance in home environment: 50'   Problem: RH Stairs Goal: LTG Patient will ambulate up and down stairs w/assist (PT) Description: LTG: Patient will ambulate up and down # of stairs with assistance (PT) Flowsheets (Taken 11/26/2023 1214) LTG: Pt will ambulate up/down stairs assist needed:: Supervision/Verbal cueing LTG: Pt will  ambulate up and down number of stairs: 4 to simulate home set up

## 2023-11-26 NOTE — Progress Notes (Signed)
 PROGRESS NOTE   Subjective/Complaints:  Pt doing well, slept well, denies pain, LBM just now, nephrostomy tubes doing fine, no other complaints or concerns.   ROS: as per HPI. Denies CP, SOB, abd pain, N/V/D/C, or any other complaints at this time.    Objective:   No results found. Recent Labs    11/24/23 0553 11/25/23 0415  WBC 6.8 6.3  HGB 9.7* 9.9*  HCT 28.9* 30.0*  PLT 141* 143*   Recent Labs    11/25/23 0415 11/26/23 0455  NA 147* 142  K 3.6 4.0  CL 113* 113*  CO2 21* 18*  GLUCOSE 115* 103*  BUN 20 26*  CREATININE 1.52* 1.40*  CALCIUM  8.2* 8.2*    Urinalysis    Component Value Date/Time   COLORURINE YELLOW 11/20/2023 2204   APPEARANCEUR HAZY (A) 11/20/2023 2204   LABSPEC 1.026 11/20/2023 2204   PHURINE 5.0 11/20/2023 2204   GLUCOSEU NEGATIVE 11/20/2023 2204   HGBUR SMALL (A) 11/20/2023 2204   BILIRUBINUR NEGATIVE 11/20/2023 2204   KETONESUR NEGATIVE 11/20/2023 2204   PROTEINUR 100 (A) 11/20/2023 2204   NITRITE POSITIVE (A) 11/20/2023 2204   LEUKOCYTESUR LARGE (A) 11/20/2023 2204      Intake/Output Summary (Last 24 hours) at 11/26/2023 1154 Last data filed at 11/26/2023 1000 Gross per 24 hour  Intake 476 ml  Output 350 ml  Net 126 ml        Physical Exam: Vital Signs Blood pressure (!) 148/79, pulse 73, temperature 97.6 F (36.4 C), resp. rate 15, SpO2 100%.   General: No apparent distress, up using walker to use restroom HEENT: Head is normocephalic, atraumatic, sclera anicteric, oral mucosa pink and moist Neck: Supple without JVD or lymphadenopathy Heart: Reg rate and rhythm. No murmurs rubs or gallops, port R chest  Chest: CTA bilaterally without wheezes, rales, or rhonchi; no distress Abdomen: Soft, non-tender, non-distended, bowel sounds positive. B/l flank nephrostomy tubes with yellow urine Extremities: 1+ LE edema- chronic Psych: Pt's affect is flat  Skin: R groin site with Dry  dressing,   PRIOR EXAMS: Neuro:     Mental Status: AAOx person, place, not date. Able to answer situation with choices. Delayed responses  Speech/Languate: + Aphasia. Word finding difficulties. Mild dysarthria. Naming and repetition intact, follows simple commands CRANIAL NERVES: II: PERRL. Visual fields full III, IV, VI: EOM intact, no gaze preference or deviation V: normal sensation bilaterally VII: Slight right facial weakness VIII: normal hearing to speech IX, X: normal palatal elevation XI: head turn and shoulder shrug decreased on R XII: Tongue midline     MOTOR: RUE: 3/5 Deltoid, 4-/5 Biceps, 4-/5 Triceps,4-/5 Grip LUE: 5/5 Deltoid, 4+/5 Biceps, 4+/5 Triceps, 5/5 Grip RLE: HF 4/5, KE 4/5, ADF 4/5, APF 4/5 LLE: HF 5/5, KE 5/5, ADF 5/5, APF 5/5   SENSORY: Normal to touch all 4 extremities   Coordination: Normal finger to nose intact b/l, slow on R   Increased tone R biceps  Assessment/Plan: 1. Functional deficits which require 3+ hours per day of interdisciplinary therapy in a comprehensive inpatient rehab setting. Physiatrist is providing close team supervision and 24 hour management of active medical problems listed below. Physiatrist and rehab  team continue to assess barriers to discharge/monitor patient progress toward functional and medical goals  Care Tool:  Bathing    Body parts bathed by patient: Right arm, Left arm, Chest, Abdomen, Right upper leg, Left upper leg, Front perineal area, Face   Body parts bathed by helper: Buttocks, Front perineal area, Left lower leg, Right lower leg     Bathing assist Assist Level: Moderate Assistance - Patient 50 - 74%     Upper Body Dressing/Undressing Upper body dressing   What is the patient wearing?: Pull over shirt    Upper body assist Assist Level: Moderate Assistance - Patient 50 - 74%    Lower Body Dressing/Undressing Lower body dressing      What is the patient wearing?: Pants, Underwear/pull up      Lower body assist Assist for lower body dressing: Maximal Assistance - Patient 25 - 49%     Toileting Toileting    Toileting assist Assist for toileting: Maximal Assistance - Patient 25 - 49%     Transfers Chair/bed transfer  Transfers assist     Chair/bed transfer assist level: Moderate Assistance - Patient 50 - 74%     Locomotion Ambulation   Ambulation assist      Assist level: Minimal Assistance - Patient > 75% Assistive device: Walker-rolling Max distance: 100'   Walk 10 feet activity   Assist     Assist level: Minimal Assistance - Patient > 75% Assistive device: Walker-rolling   Walk 50 feet activity   Assist    Assist level: Minimal Assistance - Patient > 75% Assistive device: Walker-rolling    Walk 150 feet activity   Assist    Assist level: Minimal Assistance - Patient > 75% Assistive device: Walker-rolling    Walk 10 feet on uneven surface  activity   Assist     Assist level: Minimal Assistance - Patient > 75% Assistive device: Walker-rolling   Wheelchair     Assist Is the patient using a wheelchair?: Yes Type of Wheelchair: Manual    Wheelchair assist level: Dependent - Patient 0%      Wheelchair 50 feet with 2 turns activity    Assist        Assist Level: Dependent - Patient 0%   Wheelchair 150 feet activity     Assist      Assist Level: Dependent - Patient 0%   Blood pressure (!) 148/79, pulse 73, temperature 97.6 F (36.4 C), resp. rate 15, SpO2 100%.  Medical Problem List and Plan: 1. Functional deficits secondary to left MCA scattered infarcts with left ICA and left MCA occlusion s/p IR with TICI2c likely due to hypercoagulable state in setting of cancer             -patient may shower, cover incision/nephrostomy tubes             -ELOS/Goals: 12-16 days, PT/OT sup to min A, SLP supervision             -Continue CIR 2.  Antithrombotics: -DVT/anticoagulation:  Mechanical: Sequential  compression devices, below knee Bilateral lower extremities Pharmaceutical: Eliquis  5mg  BID             -antiplatelet therapy: N/A 3. Pain Management: Tylenol  prn  4. Mood/Behavior/Sleep: LCSW to follow for evaluation and support when available.              -antipsychotic agents: N/A 5. Neuropsych/cognition: This patient may be capable of making decisions on his own behalf. 6. Skin/Wound Care: routine pressure  relief measures   7. Fluids/Electrolytes/Nutrition: Monitor I/O  - Per speech past swallow study continue regular diet w/ Ensure and multivitamin supplements-- SLP ordered 8/9  8.  Bladder Cancer s/p nephrostomy tubes: stage IV, metastatic to the bone by MRI of the spine on 11/08/2023.  -Hx of prostate CA, on androgen therapy- leupron q6mos-- follows with [facility] -UTI: Ceftriaxone  1g daily ends 8/10  9. AKI: Monitor BMP w/GFR  Cr 1.22--1.42--1.52>> 1.40 on 8/9. Monitor fluid intake  10.  Anemia: Hemoglobin stable post PRBC 2 units Hgb 9.9-- stable on admission  11. HTN: hx of orthostasis on 8/4 resolved after IV fluids--home amlodipine  5 mg daily resumed--Carvdilol 12.5 mg not resumed - resume as needed. Avoid hypotension  -11/26/23 BPs mostly ok, monitor trend  Vitals:   11/25/23 1551 11/25/23 1934 11/26/23 0445  BP: 130/84 131/67 (!) 148/79     12. DVT: hx of multiple BL DVT and PE had IVC filter placed 2014 --resumed Eliquis   -US  LE positive for age indeterminate DVTs: RT - PopV and PTV LT - CFV, PFV, FV, PopV, Gastrocs   13. HLD: LDL 112, goal < 70, atorvastatin  80mg  daily  14. Obesity: Educate on diet and weight loss to promote overall health and mobility.  Body mass index is 33.06 kg/m.  15. Hx of laryngeal cancer s/p radiation therapy   LOS: 1 days A FACE TO FACE EVALUATION WAS PERFORMED  [address] 11/26/2023, 11:54 AM

## 2023-11-26 NOTE — Evaluation (Signed)
 Speech Language Pathology Assessment and Plan  Patient Details  Name: Allen Macdonald MRN: [id number] Date of Birth: 28-Mar-1951  SLP Diagnosis: Aphasia;Apraxia  Rehab Potential: Excellent ELOS: 12-14 days    Today's Date: 11/26/2023 SLP Individual Time: 1316-1400 SLP Individual Time Calculation (min): 44 min and Today's Date: 11/26/2023 SLP Missed Time: 16 Minutes Missed Time Reason: Toileting   Hospital Problem: Principal Problem:   CVA (cerebral vascular accident) [facility])  Past Medical History:  Past Medical History:  Diagnosis Date   Abnormal stress test minimal ischemia involving apex 05/01/2019   Bladder cancer (HCC)    Dilated cardiomyopathy (HCC)    Essential hypertension 12/18/2015   History of DVT (deep vein thrombosis) 04/22/2017   History of pulmonary embolism 12/18/2015   Laryngeal cancer (HCC) squamous cell 02/25/2020   PC (prostate cancer) (HCC) 08/28/2019   Past Surgical History:  Past Surgical History:  Procedure Laterality Date   CATARACT EXTRACTION Bilateral    INSERTION PROSTATE RADIATION SEED  2003   IR CT HEAD LTD  11/20/2023   IR PERCUTANEOUS ART THROMBECTOMY/INFUSION INTRACRANIAL INC DIAG ANGIO  11/20/2023   IR PERCUTANEOUS ART THROMBECTOMY/INFUSION INTRACRANIAL INC DIAG ANGIO  11/21/2023   IR US  GUIDE VASC ACCESS RIGHT  11/20/2023   IR US  GUIDE VASC ACCESS RIGHT  11/21/2023   PROSTATE SURGERY     RADIOLOGY WITH ANESTHESIA N/A 11/20/2023   Procedure: RADIOLOGY WITH ANESTHESIA;  Surgeon: Radiologist, Medication, MD;  Location: [facility] OR;  Service: Radiology;  Laterality: N/A;   RADIOLOGY WITH ANESTHESIA N/A 11/21/2023   Procedure: RADIOLOGY WITH ANESTHESIA;  Surgeon: Radiologist, Medication, MD;  Location: [facility] OR;  Service: Radiology;  Laterality: N/A;   TONSILLECTOMY     Venacava Filter     WISDOM TOOTH EXTRACTION      Assessment / Plan / Recommendation Clinical Impression Patient is a 73 year old male with PMHx of HTN, DVT/PE on Eliquis , bladder cancer s/p  nephrostomy. Per chart review he had taken Eliquis  2.5 mg at bedtime, around 0815 he was unable to get up from the toilet and became mute. He presented to [facility] via EMS for right sided weakness, left gaze and aphasia. Code stroke was activated upon arrival. Ct head showed no intracranial abnormality. Neurology consulted, CTA head/neck demonstrated a terminal left ICA occlusion with poor opacification of the left MCA branches. He was not a candidate for TNK with previous Eliquis  use. On 8/3 patient underwent emergent stent retriever assisted mechanical thrombectomy of left carotid T and M1 occlusions with TICI 2C by Dr. Ray. While working with PT on 8/4 he experienced left gaze, slurred speech and RUE weakness was note and neurology was consulted.  Stat CT head and CTA head and neck with perfusion which revealed left M1 occlusion,Infarct core within the left temporal lobe and penumbra involving the temporal, frontal, and parietal lobe. Code IR was activated and he underwent M1 Thrombectomy due to dissection of left M1 with TICI 2B vascularization, cangrelor  drip was initiated. Post intervention pt still have no improvement with right arm flaccidity and aphasia. Repeat CTA head/neck showed no LVO. US  LE positive for age indeterminate DVTs. He recevievd 2 units PRBC for ABLA and Eliquis  was resumed as hgb stabilized.  Pt reports strength gradually improving.   Cognitive-Linguistic: Pt presented w/ moderate expressive > receptive aphasia and mild apraxia. During peri care and structured eval tasks, he was able to follow functional 1 step commands independently. Mild deficits noted w/ one step commands of mild complexity and moderate deficits  noted w/ multistep commands. Simple auditory comprehension of Y/N and open ended questions WFL in conversational tasks, though moderate deficits noted w/ mildly complex info. Mild to moderate deficits noted w/ written expression of biographical info. Automatic  verbal responses WFL in conversation, though moderate deficits noted as responses increased in specificity and length. Mild apraxia noted overall, which further impacted verbal responses. No dysarthria noted. He was stimulable to sentence completion and phonemic cues. No concerns re cognition at this time, will continue to dynamically assess. Pt would benefit from skilled ST services to target aphasia/apraxia, maximize pt communication and subsequent independence, and facilitate return to prev roles/responsibilities.   Bedside Swallow: Pt reported no concern re oropharyngeal dysphagia. Additionally, no overt s/s of airway invasion noted w/ consecutive sips of thin liquids via cup. Further eval/dysphagia intervention not warranted at this time.     Skilled Therapeutic Interventions          SLP facilitated a cognitive-linguistic evaluation and brief bedside swallow screen to assess pt's cognitive-communication skills and determine need for additional skilled ST services. See above for more information.    SLP Assessment  Patient will need skilled Speech Lanaguage Pathology Services during CIR admission    Recommendations  SLP Diet Recommendations: Age appropriate regular solids;Thin Liquid Administration via: Straw Medication Administration: Whole meds with liquid Supervision: Patient able to self feed Compensations: Slow rate;Small sips/bites Postural Changes and/or Swallow Maneuvers: Seated upright 90 degrees;Upright 30-60 min after meal Oral Care Recommendations: Oral care BID Recommendations for Other Services: Neuropsych consult;Therapeutic Recreation consult Therapeutic Recreation Interventions: Pet therapy;Outing/community reintergration Patient destination: Home Follow up Recommendations: Outpatient SLP;Home Health SLP Equipment Recommended: None recommended by SLP    SLP Frequency 3 to 5 out of 7 days   SLP Duration  SLP Intensity  SLP Treatment/Interventions 12-14  days  Minumum of 1-2 x/day, 30 to 90 minutes  Multimodal communication approach;Speech/Language facilitation;Cueing hierarchy;Functional tasks;Therapeutic Activities;Patient/family education;Internal/external aids    Pain  None reported  Prior Functioning  Independent  SLP Evaluation Cognition Overall Cognitive Status: Difficult to assess Arousal/Alertness: Awake/alert Orientation Level: Oriented to person;Oriented to place;Oriented to situation Year: 2025 Month: July Day of Week: Correct Attention: Selective Selective Attention: Appears intact Awareness: Appears intact Safety/Judgment: Appears intact Comments: difficult to assess given aphasia, functional cognition appeared [facility]  Comprehension Auditory Comprehension Overall Auditory Comprehension: Impaired Yes/No Questions: Impaired Basic Biographical Questions: 76-100% accurate Basic Immediate Environment Questions: 75-100% accurate Complex Questions: 75-100% accurate Commands: Impaired One Step Basic Commands: 75-100% accurate Two Step Basic Commands: 50-74% accurate Multistep Basic Commands: 25-49% accurate Conversation: Simple Interfering Components: Motor planning EffectiveTechniques: Extra processing time;Repetition;Stressing words Visual Recognition/Discrimination Discrimination: Not tested Reading Comprehension Reading Status: Not tested Expression Expression Primary Mode of Expression: Verbal Verbal Expression Overall Verbal Expression: Impaired Initiation: No impairment Level of Generative/Spontaneous Verbalization: Phrase Repetition: Impaired Level of Impairment: Sentence level Naming: Impairment Responsive: 76-100% accurate Confrontation: Impaired Divergent: 25-49% accurate Verbal Errors: Phonemic paraphasias;Neologisms;Semantic paraphasias Pragmatics: No impairment Effective Techniques: Sentence completion;Phonemic cues Non-Verbal Means of Communication: Not applicable Written  Expression Dominant Hand: Left Written Expression: Exceptions to [facility] Self Formulation Ability: Word Oral Motor Oral Motor/Sensory Function Overall Oral Motor/Sensory Function: Mild impairment Facial ROM: Reduced right Facial Symmetry: Abnormal symmetry right;Suspected CN VII (facial) dysfunction Facial Sensation: Within Functional Limits Lingual ROM: Within Functional Limits Lingual Symmetry: Within Functional Limits Lingual Strength: Within Functional Limits Motor Speech Overall Motor Speech: Impaired Intelligibility: Intelligible Motor Planning: Impaired Level of Impairment: Word Motor Speech Errors: Aware;Inconsistent  Care Tool Care Tool  Cognition Ability to hear (with hearing aid or hearing appliances if normally used Ability to hear (with hearing aid or hearing appliances if normally used): 0. Adequate - no difficulty in normal conservation, social interaction, listening to TV   Expression of Ideas and Wants Expression of Ideas and Wants: 3. Some difficulty - exhibits some difficulty with expressing needs and ideas (e.g, some words or finishing thoughts) or speech is not clear   Understanding Verbal and Non-Verbal Content Understanding Verbal and Non-Verbal Content: 3. Usually understands - understands most conversations, but misses some part/intent of message. Requires cues at times to understand  Memory/Recall Ability Memory/Recall Ability : Current season;That he or she is in a hospital/hospital unit   Motor Speech Assessment  See clinical impression Intelligibility: Intelligible   Short Term Goals: Week 1: SLP Short Term Goal 1 (Week 1): Pt will complete mildly complex naming tasks w/ minA SLP Short Term Goal 2 (Week 1): Pt will follow mildly complex directions w/ minA SLP Short Term Goal 3 (Week 1): Pt will answer mildly complex Y/N questions w/ minA SLP Short Term Goal 4 (Week 1): Pt will complete written expression tasks w/ minA  Refer to Care Plan for Long Term  Goals  Recommendations for other services: Neuropsych and Therapeutic Recreation  Pet therapy and Outing/community reintegration  Discharge Criteria: Patient will be discharged from SLP if patient refuses treatment 3 consecutive times without medical reason, if treatment goals not met, if there is a change in medical status, if patient makes no progress towards goals or if patient is discharged from hospital.  The above assessment, treatment plan, treatment alternatives and goals were discussed and mutually agreed upon: by patient  [name] 11/26/2023, 4:12 PM

## 2023-11-26 NOTE — Evaluation (Signed)
 Physical Therapy Assessment and Plan  Patient Details  Name: Allen Macdonald MRN: [id number] Date of Birth: [birth year]  PT Diagnosis: Abnormality of gait, Coordination disorder, Difficulty walking, Hemiparesis non-dominant, Impaired sensation, and Muscle weakness Rehab Potential: Good ELOS: 12-14 days   Today's Date: 11/26/2023 PT Individual Time: 8996-8886 PT Individual Time Calculation (min): 70 min    Hospital Problem: Principal Problem:   CVA (cerebral vascular accident) [facility])   Past Medical History:  Past Medical History:  Diagnosis Date   Abnormal stress test minimal ischemia involving apex 05/01/2019   Bladder cancer (HCC)    Dilated cardiomyopathy (HCC)    Essential hypertension 12/18/2015   History of DVT (deep vein thrombosis) 04/22/2017   History of pulmonary embolism 12/18/2015   Laryngeal cancer (HCC) squamous cell 02/25/2020   PC (prostate cancer) (HCC) 08/28/2019   Past Surgical History:  Past Surgical History:  Procedure Laterality Date   CATARACT EXTRACTION Bilateral    INSERTION PROSTATE RADIATION SEED  2003   IR CT HEAD LTD  11/20/2023   IR PERCUTANEOUS ART THROMBECTOMY/INFUSION INTRACRANIAL INC DIAG ANGIO  11/20/2023   IR PERCUTANEOUS ART THROMBECTOMY/INFUSION INTRACRANIAL INC DIAG ANGIO  11/21/2023   IR US  GUIDE VASC ACCESS RIGHT  11/20/2023   IR US  GUIDE VASC ACCESS RIGHT  11/21/2023   PROSTATE SURGERY     RADIOLOGY WITH ANESTHESIA N/A 11/20/2023   Procedure: RADIOLOGY WITH ANESTHESIA;  Surgeon: Radiologist, Medication, MD;  Location: [facility] OR;  Service: Radiology;  Laterality: N/A;   RADIOLOGY WITH ANESTHESIA N/A 11/21/2023   Procedure: RADIOLOGY WITH ANESTHESIA;  Surgeon: Radiologist, Medication, MD;  Location: [facility] OR;  Service: Radiology;  Laterality: N/A;   TONSILLECTOMY     Venacava Filter     WISDOM TOOTH EXTRACTION      Assessment & Plan Clinical Impression: Patient is a 73 year old male with PMHx of HTN, DVT/PE on Eliquis , bladder cancer s/p nephrostomy.  Per chart review he had taken Eliquis  2.5 mg at bedtime, around 0815 he was unable to get up from the toilet and became mute. He presented to [facility] via EMS for right sided weakness, left gaze and aphasia. Code stroke was activated upon arrival. Ct head showed no intracranial abnormality. Neurology consulted, CTA head/neck demonstrated a terminal left ICA occlusion with poor opacification of the left MCA branches. He was not a candidate for TNK with previous Eliquis  use. On 8/3 patient underwent emergent stent retriever assisted mechanical thrombectomy of left carotid T and M1 occlusions with TICI 2C by Dr. Ray. While working with PT on 8/4 he experienced left gaze, slurred speech and RUE weakness was note and neurology was consulted.  Stat CT head and CTA head and neck with perfusion which revealed left M1 occlusion,Infarct core within the left temporal lobe and penumbra involving the temporal, frontal, and parietal lobe. Code IR was activated and he underwent M1 Thrombectomy due to dissection of left M1 with TICI 2B vascularization, cangrelor  drip was initiated. Post intervention pt still have no improvement with right arm flaccidity and aphasia. Repeat CTA head/neck showed no LVO. US  LE positive for age indeterminate DVTs. He recevievd 2 units PRBC for ABLA and Eliquis  was resumed as hgb stabilized.  Pt reports strength gradually improving.  He sometimes uses compression hose for LE swelling at home.  Prior to arrival patient was independent with use of home equipment at home. Currently he requires min assist for functional mobility, mod assist with RW with cues for sequencing and posture, and min assist for  ADLs. Therapy evaluations completed due to patient decreased functional mobility was admitted for a comprehensive rehab program.   Patient currently requires mod with mobility secondary to muscle weakness, decreased cardiorespiratoy endurance, impaired timing and sequencing, decreased  coordination, and decreased motor planning, decreased attention to right, and decreased standing balance, decreased postural control, hemiplegia, and decreased balance strategies.  Prior to hospitalization, patient was independent  with mobility and lived with Spouse in a House home.  Home access is 3Stairs to enter.  Patient will benefit from skilled PT intervention to maximize safe functional mobility, minimize fall risk, and decrease caregiver burden for planned discharge home with 24 hour supervision.  Anticipate patient will benefit from follow up HH at discharge.  PT - End of Session Activity Tolerance: Tolerates 30+ min activity with multiple rests Endurance Deficit: Yes Endurance Deficit Description: rest breaks for all mobility tasks PT Assessment Rehab Potential (ACUTE/IP ONLY): Good PT Barriers to Discharge: Inaccessible home environment;Decreased caregiver support;Home environment access/layout PT Barriers to Discharge Comments: 3 STE, limited caregiver support (wife is visually impaired) PT Patient demonstrates impairments in the following area(s): Balance;Behavior;Edema;Endurance;Motor;Perception;Safety;Sensory PT Transfers Functional Problem(s): Bed Mobility;Bed to Chair;Car PT Locomotion Functional Problem(s): Ambulation;Stairs PT Plan PT Intensity: Minimum of 1-2 x/day ,45 to 90 minutes PT Frequency: 5 out of 7 days PT Duration Estimated Length of Stay: 12-14 days PT Treatment/Interventions: Ambulation/gait training;Discharge planning;Functional mobility training;Psychosocial support;Therapeutic Activities;Visual/perceptual remediation/compensation;Balance/vestibular training;Neuromuscular re-education;Therapeutic Exercise;Disease management/prevention;Cognitive remediation/compensation;DME/adaptive equipment instruction;UE/LE Strength taining/ROM;Splinting/orthotics;Community reintegration;Patient/family education;Stair training;UE/LE Coordination activities PT Transfers  Anticipated Outcome(s): supervision PT Locomotion Anticipated Outcome(s): supervision ambulatory PT Recommendation Recommendations for Other Services: None Patient destination: Home Equipment Recommended: To be determined   PT Evaluation Precautions/Restrictions Precautions Precautions: Fall;Other (comment) Recall of Precautions/Restrictions: Intact Precaution/Restrictions Comments: bilat nephrostomy bags Restrictions Weight Bearing Restrictions Per Provider Order: No General   Vital Signs  Pain Pain Assessment Pain Scale: 0-10 Pain Score: 0-No pain Pain Interference Pain Interference Pain Effect on Sleep: 1. Rarely or not at all Pain Interference with Therapy Activities: 1. Rarely or not at all Pain Interference with Day-to-Day Activities: 1. Rarely or not at all Home Living/Prior Functioning Home Living Available Help at Discharge: Family;Available 24 hours/day Type of Home: House Home Access: Stairs to enter [employer] of Steps: 3 Entrance Stairs-Rails: Can reach both Home Layout: One level Bathroom Toilet: Standard Bathroom Accessibility: Yes  Lives With: Spouse Prior Function Level of Independence: Independent with basic ADLs;Independent with gait;Independent with homemaking with ambulation;Independent with transfers  Able to Take Stairs?: Yes Driving: Yes Vocation: Retired Vision/Perception  Vision - History Ability to See in Adequate Light: 0 Adequate Vision - Assessment Eye Alignment: Within Functional Limits Ocular Range of Motion: Within Functional Limits Alignment/Gaze Preference: Gaze left (mild L gaze preference) Tracking/Visual Pursuits: Decreased smoothness of horizontal tracking;Impaired - to be further tested in functional context Saccades: Decreased speed of saccadic movement Convergence: Impaired (comment) (R eye slow to converge, however no double vision reported) Perception Perception: Impaired Preception Impairment Details:  Inattention/Neglect Perception-Other Comments: to R hemibody Praxis Praxis: Impaired Praxis Impairment Details: Motor planning Praxis-Other Comments: Difficultly motor planning movements with R UE  Cognition Overall Cognitive Status: Within Functional Limits for tasks assessed Arousal/Alertness: Awake/alert Orientation Level: Oriented to person;Oriented to time;Oriented to place;Oriented to situation Attention: Sustained Sustained Attention: Appears intact Memory: Impaired Memory Impairment: Storage deficit;Retrieval deficit (mild memory deficits) Awareness: Appears intact Problem Solving: Impaired Problem Solving Impairment: Functional basic Executive Function:  (impaired d/t lower level deficits) Safety/Judgment: Impaired Sensation Sensation Light Touch: Appears Intact Hot/Cold:  Appears Intact Proprioception: Appears Intact Stereognosis: Appears Intact Coordination Gross Motor Movements are Fluid and Coordinated: No Fine Motor Movements are Fluid and Coordinated: No Coordination and Movement Description: coordination impaired secondary to R hemi Finger Nose Finger Test: dysmetria and increased time with RUE Motor  Motor Motor: Hemiplegia Motor - Skilled Clinical Observations: mild R hemiparesis and incoordination with R side  Trunk/Postural Assessment  Cervical Assessment Cervical Assessment: Within Functional Limits Thoracic Assessment Thoracic Assessment: Within Functional Limits Lumbar Assessment Lumbar Assessment: Exceptions to [facility] (posterior pelvic tilt) Postural Control Postural Control: Deficits on evaluation Righting Reactions: delayed and inadequate Protective Responses: delayed and inadequate Postural Limitations: decreased  Balance Balance Balance Assessed: Yes Standardized Balance Assessment Standardized Balance Assessment: Timed Up and Go Test Timed Up and Go Test TUG: Normal TUG Normal TUG (seconds): 59.1 Static Sitting Balance Static Sitting -  Balance Support: No upper extremity supported;Feet supported Static Sitting - Level of Assistance: 5: Stand by assistance Dynamic Sitting Balance Dynamic Sitting - Balance Support: No upper extremity supported;Feet unsupported Dynamic Sitting - Level of Assistance: 5: Stand by assistance Static Standing Balance Static Standing - Balance Support: Bilateral upper extremity supported;During functional activity Static Standing - Level of Assistance: 4: Min assist Dynamic Standing Balance Dynamic Standing - Balance Support: Bilateral upper extremity supported;During functional activity Dynamic Standing - Level of Assistance: 4: Min assist Extremity Assessment  RLE Assessment RLE Assessment: Exceptions to [facility] General Strength Comments: grossly 4/5 LLE Assessment LLE Assessment: Exceptions to [facility] General Strength Comments: grossly 4/5  Care Tool Care Tool Bed Mobility Roll left and right activity   Roll left and right assist level: Supervision/Verbal cueing    Sit to lying activity   Sit to lying assist level: Minimal Assistance - Patient > 75%    Lying to sitting on side of bed activity   Lying to sitting on side of bed assist level: the ability to move from lying on the back to sitting on the side of the bed with no back support.: Minimal Assistance - Patient > 75%     Care Tool Transfers Sit to stand transfer   Sit to stand assist level: Moderate Assistance - Patient 50 - 74%    Chair/bed transfer   Chair/bed transfer assist level: Moderate Assistance - Patient 50 - 74%    Car transfer   Car transfer assist level: Moderate Assistance - Patient 50 - 74%      Care Tool Locomotion Ambulation   Assist level: Minimal Assistance - Patient > 75% Assistive device: Walker-rolling Max distance: 100'  Walk 10 feet activity   Assist level: Minimal Assistance - Patient > 75% Assistive device: Walker-rolling   Walk 50 feet with 2 turns activity   Assist level: Minimal Assistance -  Patient > 75% Assistive device: Walker-rolling  Walk 150 feet activity   Assist level: Minimal Assistance - Patient > 75% Assistive device: Walker-rolling  Walk 10 feet on uneven surfaces activity   Assist level: Minimal Assistance - Patient > 75% Assistive device: Walker-rolling  Stairs   Assist level: Minimal Assistance - Patient > 75% Stairs assistive device: 2 hand rails Max number of stairs: 1  Walk up/down 1 step activity   Walk up/down 1 step (curb) assist level: Minimal Assistance - Patient > 75% Walk up/down 1 step or curb assistive device: 2 hand rails  Walk up/down 4 steps activity Walk up/down 4 steps activity did not occur: Safety/medical concerns      Walk up/down 12 steps activity Walk up/down  12 steps activity did not occur: Safety/medical concerns      Pick up small objects from floor Pick up small object from the floor (from standing position) activity did not occur: Safety/medical concerns      Wheelchair Is the patient using a wheelchair?: Yes Type of Wheelchair: Manual   Wheelchair assist level: Dependent - Patient 0%    Wheel 50 feet with 2 turns activity   Assist Level: Dependent - Patient 0%  Wheel 150 feet activity   Assist Level: Dependent - Patient 0%    Refer to Care Plan for Long Term Goals  SHORT TERM GOAL WEEK 1 PT Short Term Goal 1 (Week 1): Pt will complete transfers with CGA consistently PT Short Term Goal 2 (Week 1): Pt will complete up/down 4 steps with CGA and BHRs PT Short Term Goal 3 (Week 1): Pt will ambulate 150' with RW CGA  Recommendations for other services: None   Skilled Therapeutic Intervention Evaluation completed (see details above and below) with education on PT POC and goals and individual treatment initiated with focus on gait training for ambulation with RW and for stair negotiation as well as therapeutic activities for transfer training, upright tolerance and energy conservation, and NMR for dynamic standing balance  and BLE coordination. Pt denies pain throughout session. Pt with expressive aphasia and requires increased time to respond. Pt noted to have mild R hemiparesis, RUE more affected than RLE, with pt demonstrating mild coordination deficits. Pt also demonstrating endurance deficits requiring increased time for seated rest breaks between activities. Pt completes transfers with min assist throughout session, cues for hand placement and technique. Pt ambulates with RW with CGA/light min assist 100' with pt demonstrating decreased proximity to RW, cues to correct, cues for positioning prior to sitting on EOM, requires increased time to rest with pt demonstrating SOB that subsides with rest break, denies dizziness or headache. Pt completes up/down 3 step with BUE support on BHRs x5 reps on bottom step ascending with RLE, descending with LLE, CGA/light min assist. Pt then completes TUG in 59.1 seconds, requires min assist and increased time to stand, CGA throughout gait, indicating increased risk for falls. Pt completes car transfer with RW mod assist for BLEs into car and to stand from car height, cues for hand placement and sequencing. Pt takes seated rest break then completes up/down ramp with RW CGA/min assist. Pt returns to room and completes stand step transfer to recliner where he remains seated with all needs within reach, cal light in place and chair alarm activated at end of session.    Mobility Bed Mobility Bed Mobility: Supine to Sit;Sit to Supine Supine to Sit: Minimal Assistance - Patient > 75% Sit to Supine: Minimal Assistance - Patient > 75% Transfers Transfers: Sit to Stand;Stand to Sit;Stand Pivot Transfers Sit to Stand: Moderate Assistance - Patient 50-74% Stand to Sit: Moderate Assistance - Patient 50-74% Stand Pivot Transfers: Moderate Assistance - Patient 50 - 74% Stand Pivot Transfer Details: Verbal cues for safe use of DME/AE;Verbal cues for precautions/safety;Manual facilitation for  weight shifting Transfer (Assistive device): Rolling walker Locomotion  Gait Ambulation: Yes Gait Assistance: Minimal Assistance - Patient > 75%;Contact Guard/Touching assist Gait Distance (Feet): 100 Feet Assistive device: Rolling walker Gait Assistance Details: Verbal cues for safe use of DME/AE;Verbal cues for precautions/safety;Verbal cues for gait pattern Gait Gait: Yes Gait Pattern: Impaired Gait Pattern: Trunk flexed;Wide base of support;Poor foot clearance - left;Poor foot clearance - right Gait velocity: reduced Stairs / Additional Locomotion  Stairs: Yes Stairs Assistance: Minimal Assistance - Patient > 75% Stair Management Technique: Two rails Number of Stairs: 1 Height of Stairs: 3 Ramp: Minimal Assistance - Patient >75% Wheelchair Mobility Wheelchair Mobility: No   Discharge Criteria: Patient will be discharged from PT if patient refuses treatment 3 consecutive times without medical reason, if treatment goals not met, if there is a change in medical status, if patient makes no progress towards goals or if patient is discharged from hospital.  The above assessment, treatment plan, treatment alternatives and goals were discussed and mutually agreed upon: by patient  Reche Ohara PT, DPT 11/26/2023, 11:22 AM

## 2023-11-26 NOTE — Evaluation (Signed)
 Occupational Therapy Assessment and Plan  Patient Details  Name: Allen Macdonald MRN: [id number] Date of Birth: [birth year]  OT Diagnosis: cognitive deficits, disturbance of vision, hemiparesis affecting non-dominant side, and muscle weakness (generalized) Rehab Potential: Rehab Potential (ACUTE ONLY): Good ELOS: 12-16 days   Today's Date: 11/26/2023 OT Individual Time: 9264-9169 OT Individual Time Calculation (min): 55 min     Hospital Problem: Principal Problem:   CVA (cerebral vascular accident) [facility])   Past Medical History:  Past Medical History:  Diagnosis Date   Abnormal stress test minimal ischemia involving apex 05/01/2019   Bladder cancer (HCC)    Dilated cardiomyopathy (HCC)    Essential hypertension 12/18/2015   History of DVT (deep vein thrombosis) 04/22/2017   History of pulmonary embolism 12/18/2015   Laryngeal cancer (HCC) squamous cell 02/25/2020   PC (prostate cancer) (HCC) 08/28/2019   Past Surgical History:  Past Surgical History:  Procedure Laterality Date   CATARACT EXTRACTION Bilateral    INSERTION PROSTATE RADIATION SEED  2003   IR CT HEAD LTD  11/20/2023   IR PERCUTANEOUS ART THROMBECTOMY/INFUSION INTRACRANIAL INC DIAG ANGIO  11/20/2023   IR PERCUTANEOUS ART THROMBECTOMY/INFUSION INTRACRANIAL INC DIAG ANGIO  11/21/2023   IR US  GUIDE VASC ACCESS RIGHT  11/20/2023   IR US  GUIDE VASC ACCESS RIGHT  11/21/2023   PROSTATE SURGERY     RADIOLOGY WITH ANESTHESIA N/A 11/20/2023   Procedure: RADIOLOGY WITH ANESTHESIA;  Surgeon: Radiologist, Medication, MD;  Location: [facility] OR;  Service: Radiology;  Laterality: N/A;   RADIOLOGY WITH ANESTHESIA N/A 11/21/2023   Procedure: RADIOLOGY WITH ANESTHESIA;  Surgeon: Radiologist, Medication, MD;  Location: [facility] OR;  Service: Radiology;  Laterality: N/A;   TONSILLECTOMY     Venacava Filter     WISDOM TOOTH EXTRACTION      Assessment & Plan Clinical Impression: Allen Macdonald is a 73 year old male with PMHx of HTN, DVT/PE on Eliquis ,  bladder cancer s/p nephrostomy. Per chart review he had taken Eliquis  2.5 mg at bedtime, around 0815 he was unable to get up from the toilet and became mute. He presented to [facility] via EMS for right sided weakness, left gaze and aphasia. Code stroke was activated upon arrival. Ct head showed no intracranial abnormality. Neurology consulted, CTA head/neck demonstrated a terminal left ICA occlusion with poor opacification of the left MCA branches. He was not a candidate for TNK with previous Eliquis  use. On 8/3 patient underwent emergent stent retriever assisted mechanical thrombectomy of left carotid T and M1 occlusions with TICI 2C by Dr. Ray. While working with PT on 8/4 he experienced left gaze, slurred speech and RUE weakness was note and neurology was consulted.  Stat CT head and CTA head and neck with perfusion which revealed left M1 occlusion,Infarct core within the left temporal lobe and penumbra involving the temporal, frontal, and parietal lobe. Code IR was activated and he underwent M1 Thrombectomy due to dissection of left M1 with TICI 2B vascularization, cangrelor  drip was initiated. Post intervention pt still have no improvement with right arm flaccidity and aphasia. Repeat CTA head/neck showed no LVO. US  LE positive for age indeterminate DVTs. He recevievd 2 units PRBC for ABLA and Eliquis  was resumed as hgb stabilized.  Pt reports strength gradually improving.  He sometimes uses compression hose for LE swelling at home.  Prior to arrival patient was independent with use of home equipment at home.Patient transferred to CIR on 11/25/2023 .    Patient currently requires max with basic self-care skills  secondary to muscle weakness, decreased cardiorespiratoy endurance, impaired timing and sequencing, unbalanced muscle activation, decreased coordination, and decreased motor planning, decreased visual motor skills and hemianopsia, decreased attention to right and decreased motor planning,  decreased problem solving and decreased safety awareness, central origin, and decreased sitting balance, decreased standing balance, decreased postural control, hemiparesis, and decreased balance strategies.  Prior to hospitalization, patient could complete ADLs, IADLs, and drive with independent .  Patient will benefit from skilled intervention to decrease level of assist with basic self-care skills and increase independence with basic self-care skills prior to discharge home with care partner.  Anticipate patient will require 24 hour supervision and follow up home health.  OT - End of Session Activity Tolerance: Decreased this session Endurance Deficit: Yes Endurance Deficit Description: rest breaks for all mobility tasks OT Assessment Rehab Potential (ACUTE ONLY): Good OT Barriers to Discharge: None OT Patient demonstrates impairments in the following area(s): Balance;Endurance;Perception;Vision;Motor;Safety;Cognition;Sensory;Pain OT Basic ADL's Functional Problem(s): Toileting;Grooming;Bathing;Dressing OT Advanced ADL's Functional Problem(s): None OT Transfers Functional Problem(s): Toilet;Tub/Shower OT Additional Impairment(s): Fuctional Use of Upper Extremity OT Plan OT Intensity: Minimum of 1-2 x/day, 45 to 90 minutes OT Frequency: 5 out of 7 days OT Duration/Estimated Length of Stay: 12-16 days OT Treatment/Interventions: Disease mangement/prevention;Balance/vestibular training;Cognitive remediation/compensation;Community reintegration;Discharge planning;DME/adaptive equipment instruction;Functional mobility training;Neuromuscular re-education;Functional electrical stimulation;Pain management;Patient/family education;Psychosocial support;Self Care/advanced ADL retraining;Skin care/wound managment;Splinting/orthotics;Therapeutic Activities;Therapeutic Exercise;UE/LE Strength taining/ROM;UE/LE Coordination activities;Visual/perceptual remediation/compensation OT Self Feeding Anticipated  Outcome(s): Mod I OT Basic Self-Care Anticipated Outcome(s): SUP OT Toileting Anticipated Outcome(s): SUP/CGA OT Bathroom Transfers Anticipated Outcome(s): SUP/CGA OT Recommendation Recommendations for Other Services: Therapeutic Recreation consult Therapeutic Recreation Interventions: Pet therapy Patient destination: Home Follow Up Recommendations: Home Health OT Equipment Recommended: To be determined   OT Evaluation Precautions/Restrictions  Precautions Precautions: Fall;Other (comment) Recall of Precautions/Restrictions: Intact Precaution/Restrictions Comments: bilat nephrostomy bags Restrictions Weight Bearing Restrictions Per Provider Order: No General Chart Reviewed: Yes Additional Pertinent History: HTN, DVT/PE on Eliquis , bladder cancer s/p nephrostomy Family/Caregiver Present: No Pain Pain Assessment Pain Scale: 0-10 Pain Score: 0-No pain Home Living/Prior Functioning Home Living Family/patient expects to be discharged to:: Private residence Living Arrangements: Spouse/significant other Available Help at Discharge: Family, Available 24 hours/day Type of Home: House Home Access: Stairs to enter Secretary/administrator of Steps: 3 Entrance Stairs-Rails: Can reach both Home Layout: One level Firefighter: Standard Bathroom Accessibility: Yes  Lives With: Spouse IADL History Homemaking Responsibilities: Yes Meal Prep Responsibility: Secondary Laundry Responsibility: Secondary Cleaning Responsibility: Secondary Bill Paying/Finance Responsibility: Secondary Shopping Responsibility: Secondary Child Care Responsibility: No Current License: Yes Mode of Transportation: Car Occupation: Retired Prior Function Level of Independence: Independent with basic ADLs, Independent with gait, Independent with homemaking with ambulation, Independent with transfers  Able to Take Stairs?: Yes Driving: Yes Vocation: Retired Administrator, sports Baseline Vision/History: 0 No visual  deficits Ability to See in Adequate Light: 0 Adequate Patient Visual Report: No change from baseline Vision Assessment?: Vision impaired- to be further tested in functional context;Yes Eye Alignment: Within Functional Limits Ocular Range of Motion: Within Functional Limits Alignment/Gaze Preference: Gaze left (mild L gaze preference) Tracking/Visual Pursuits: Decreased smoothness of horizontal tracking;Impaired - to be further tested in functional context Saccades: Decreased speed of saccadic movement Convergence: Impaired (comment) (R eye slow to converge, however no double vision reported) Visual Fields: Impaired-to be further tested in functional context;Right homonymous hemianopsia (potential R homonimous hemianopsia) Depth Perception: Undershoots Perception  Perception: Impaired Perception-Other Comments: to R hemibody Praxis Praxis: Impaired Praxis Impairment Details: Motor planning Praxis-Other Comments: Difficultly motor planning  movements with R UE Cognition Cognition Overall Cognitive Status: Difficult to assess (difficult to assess 2/2 expressive aphasia; will continue to assess) Arousal/Alertness: Awake/alert Orientation Level: Person;Situation;Place Person: Oriented Place: Oriented Situation: Oriented Memory: Impaired Memory Impairment: Storage deficit;Retrieval deficit (mild memory deficits) Attention: Sustained Sustained Attention: Appears intact Awareness: Appears intact Problem Solving: Impaired Problem Solving Impairment: Functional basic Executive Function:  (impaired d/t lower level deficits) Safety/Judgment: Impaired Brief Interview for Mental Status (BIMS) Repetition of Three Words (First Attempt): 3 (difficult to complete d/t expressive aphasia) Temporal Orientation: Year: Correct Temporal Orientation: Month: Accurate within 5 days Temporal Orientation: Day: Incorrect Recall: Sock: No, could not recall Recall: Blue: No, could not recall Recall:  Bed: Yes, no cue required BIMS Summary Score: 10 Sensation Sensation Light Touch: Appears Intact Hot/Cold: Appears Intact Proprioception: Appears Intact Stereognosis: Appears Intact Coordination Gross Motor Movements are Fluid and Coordinated: No Fine Motor Movements are Fluid and Coordinated: No Coordination and Movement Description: coordination impaired secondary to R hemi Finger Nose Finger Test: dysmetria and increased time with RUE Motor  Motor Motor: Hemiplegia Motor - Skilled Clinical Observations: mild R hemiparesis and incoordination with R side  Trunk/Postural Assessment  Cervical Assessment Cervical Assessment: Within Functional Limits Thoracic Assessment Thoracic Assessment: Within Functional Limits Lumbar Assessment Lumbar Assessment: Exceptions to [facility] (posterior pelvic tilt) Postural Control Postural Control: Deficits on evaluation Righting Reactions: delayed and inadequate Protective Responses: delayed and inadequate Postural Limitations: decreased  Balance Balance Balance Assessed: Yes Standardized Balance Assessment Standardized Balance Assessment: Timed Up and Go Test Timed Up and Go Test TUG: Normal TUG Normal TUG (seconds): 59.1 Static Sitting Balance Static Sitting - Balance Support: No upper extremity supported;Feet supported Static Sitting - Level of Assistance: 5: Stand by assistance Dynamic Sitting Balance Dynamic Sitting - Balance Support: No upper extremity supported;Feet unsupported Dynamic Sitting - Level of Assistance: 5: Stand by assistance Static Standing Balance Static Standing - Balance Support: Bilateral upper extremity supported;During functional activity Static Standing - Level of Assistance: 4: Min assist Dynamic Standing Balance Dynamic Standing - Balance Support: Bilateral upper extremity supported;During functional activity Dynamic Standing - Level of Assistance: 4: Min assist Extremity/Trunk Assessment RUE Assessment RUE  Assessment: Exceptions to [facility] Passive Range of Motion (PROM) Comments: only completed to ~90* shoulder flexion General Strength Comments: 4/5 overall, except 3-/5 shoulder flexion (potential R anterior shoudler subluxation noted) LUE Assessment LUE Assessment: Within Functional Limits  Care Tool Care Tool Self Care Eating   Eating Assist Level: Set up assist    Oral Care    Oral Care Assist Level: Minimal Assistance - Patient > 75%    Bathing   Body parts bathed by patient: Right arm;Left arm;Chest;Abdomen;Right upper leg;Left upper leg;Front perineal area;Face Body parts bathed by helper: Buttocks;Front perineal area;Left lower leg;Right lower leg   Assist Level: Moderate Assistance - Patient 50 - 74%    Upper Body Dressing(including orthotics)   What is the patient wearing?: Pull over shirt   Assist Level: Moderate Assistance - Patient 50 - 74%    Lower Body Dressing (excluding footwear)   What is the patient wearing?: Pants;Underwear/pull up Assist for lower body dressing: Maximal Assistance - Patient 25 - 49%    Putting on/Taking off footwear   What is the patient wearing?: Non-skid slipper socks Assist for footwear: Total Assistance - Patient < 25%       Care Tool Toileting Toileting activity   Assist for toileting: Maximal Assistance - Patient 25 - 49%     Care Tool Bed  Mobility Roll left and right activity   Roll left and right assist level: Supervision/Verbal cueing    Sit to lying activity   Sit to lying assist level: Minimal Assistance - Patient > 75%    Lying to sitting on side of bed activity   Lying to sitting on side of bed assist level: the ability to move from lying on the back to sitting on the side of the bed with no back support.: Minimal Assistance - Patient > 75%     Care Tool Transfers Sit to stand transfer   Sit to stand assist level: Moderate Assistance - Patient 50 - 74%    Chair/bed transfer   Chair/bed transfer assist level: Moderate  Assistance - Patient 50 - 74%     Toilet transfer   Assist Level: Moderate Assistance - Patient 50 - 74%     Care Tool Cognition  Expression of Ideas and Wants Expression of Ideas and Wants: 2. Frequent difficulty - frequently exhibits difficulty with expressing needs and ideas  Understanding Verbal and Non-Verbal Content Understanding Verbal and Non-Verbal Content: 3. Usually understands - understands most conversations, but misses some part/intent of message. Requires cues at times to understand   Memory/Recall Ability Memory/Recall Ability : That he or she is in a hospital/hospital unit;Current season   Refer to Care Plan for Long Term Goals  SHORT TERM GOAL WEEK 1 OT Short Term Goal 1 (Week 1): Pt will complete UB dressing with MIN A OT Short Term Goal 2 (Week 1): Pt will complete toileting with MIN A using LRAD OT Short Term Goal 3 (Week 1): Pt will complete toilet transfers with CGA using LRAD OT Short Term Goal 4 (Week 1): Pt will complete LB dressing MIN A using AE and LRAD PRN  Recommendations for other services: Therapeutic Recreation  Pet therapy   Skilled Therapeutic Intervention Skilled Therapeutic Interventions/Progress Updates:  1:1 OT evaluation and intervention initiated with skilled education provided on OT role, goals, and POC. Pt received semi-reclined in bed presenting with flat affect and expressive aphasia. Pt receptive to skilled OT session reporting 0/10 pain- OT offering intermittent rest breaks, repositioning, and therapeutic support to optimize participation in therapy session. Pt completed ADLs EOB at levels listed below. He was able to stand from EOB MOD A to RW and complete stand pivot to R using RW with MIN A once in standing position. Pt presenting with decreased activity tolerance, poor problem solving skills, slowed initiation, R sided weakness, decreased balance, decreased R hemibody sensation, and decreased VMC. Pt would benefit from continued OT services  in IPR setting. Pt was left resting in recliner with call bell in reach, chair alarm on, and all needs met.   ADL ADL Equipment Provided: Reacher Eating: Set up Where Assessed-Eating: Chair Grooming: Minimal assistance Where Assessed-Grooming: Chair Upper Body Bathing: Minimal assistance Where Assessed-Upper Body Bathing: Edge of bed Lower Body Bathing: Moderate assistance Where Assessed-Lower Body Bathing: Edge of bed Upper Body Dressing: Moderate assistance Where Assessed-Upper Body Dressing: Edge of bed Lower Body Dressing: Maximal assistance Where Assessed-Lower Body Dressing: Edge of bed Toileting: Maximal assistance;Moderate assistance Where Assessed-Toileting: Teacher, adult education: Curator Method: Surveyor, minerals: Engineer, technical sales: Not assessed Film/video editor: Not assessed Mobility  Bed Mobility Bed Mobility: Supine to Sit;Sit to Supine Supine to Sit: Minimal Assistance - Patient > 75% Sit to Supine: Minimal Assistance - Patient > 75% Transfers Sit to Stand: Moderate Assistance - Patient 50-74% Stand  to Sit: Moderate Assistance - Patient 50-74%   Discharge Criteria: Patient will be discharged from OT if patient refuses treatment 3 consecutive times without medical reason, if treatment goals not met, if there is a change in medical status, if patient makes no progress towards goals or if patient is discharged from hospital.  The above assessment, treatment plan, treatment alternatives and goals were discussed and mutually agreed upon: by patient  Katheryn SHAUNNA [place] 11/26/2023, 11:31 AM

## 2023-11-27 DIAGNOSIS — I63312 Cerebral infarction due to thrombosis of left middle cerebral artery: Secondary | ICD-10-CM | POA: Diagnosis not present

## 2023-11-27 DIAGNOSIS — D638 Anemia in other chronic diseases classified elsewhere: Secondary | ICD-10-CM | POA: Diagnosis not present

## 2023-11-27 DIAGNOSIS — I10 Essential (primary) hypertension: Secondary | ICD-10-CM | POA: Diagnosis not present

## 2023-11-27 NOTE — Progress Notes (Signed)
 PROGRESS NOTE   Subjective/Complaints:  Pt doing well again, slept well, denies pain, LBM this morning, nephrostomy tubes doing fine, no other complaints or concerns.   ROS: as per HPI. Denies CP, SOB, abd pain, N/V/D/C, or any other complaints at this time.    Objective:   No results found. Recent Labs    11/25/23 0415  WBC 6.3  HGB 9.9*  HCT 30.0*  PLT 143*   Recent Labs    11/25/23 0415 11/26/23 0455  NA 147* 142  K 3.6 4.0  CL 113* 113*  CO2 21* 18*  GLUCOSE 115* 103*  BUN 20 26*  CREATININE 1.52* 1.40*  CALCIUM  8.2* 8.2*    Urinalysis    Component Value Date/Time   COLORURINE YELLOW 11/20/2023 2204   APPEARANCEUR HAZY (A) 11/20/2023 2204   LABSPEC 1.026 11/20/2023 2204   PHURINE 5.0 11/20/2023 2204   GLUCOSEU NEGATIVE 11/20/2023 2204   HGBUR SMALL (A) 11/20/2023 2204   BILIRUBINUR NEGATIVE 11/20/2023 2204   KETONESUR NEGATIVE 11/20/2023 2204   PROTEINUR 100 (A) 11/20/2023 2204   NITRITE POSITIVE (A) 11/20/2023 2204   LEUKOCYTESUR LARGE (A) 11/20/2023 2204      Intake/Output Summary (Last 24 hours) at 11/27/2023 1109 Last data filed at 11/27/2023 0837 Gross per 24 hour  Intake 262.87 ml  Output 1300 ml  Net -1037.13 ml        Physical Exam: Vital Signs Blood pressure 126/77, pulse 79, temperature 98 F (36.7 C), resp. rate 15, SpO2 98%.   General: No apparent distress, sitting up in chair HEENT: Head is normocephalic, atraumatic, sclera anicteric, oral mucosa pink and moist Neck: Supple without JVD or lymphadenopathy Heart: Reg rate and rhythm. No murmurs rubs or gallops, port R chest  Chest: CTA bilaterally without wheezes, rales, or rhonchi; no distress Abdomen: Soft, non-tender, non-distended, bowel sounds positive. B/l flank nephrostomy tubes with yellow urine Extremities: 1+ LE edema- chronic Psych: Pt's affect is flat  Skin: R groin site with Dry dressing,   PRIOR  EXAMS: Neuro:     Mental Status: AAOx person, place, not date. Able to answer situation with choices. Delayed responses  Speech/Languate: + Aphasia. Word finding difficulties. Mild dysarthria. Naming and repetition intact, follows simple commands CRANIAL NERVES: II: PERRL. Visual fields full III, IV, VI: EOM intact, no gaze preference or deviation V: normal sensation bilaterally VII: Slight right facial weakness VIII: normal hearing to speech IX, X: normal palatal elevation XI: head turn and shoulder shrug decreased on R XII: Tongue midline     MOTOR: RUE: 3/5 Deltoid, 4-/5 Biceps, 4-/5 Triceps,4-/5 Grip LUE: 5/5 Deltoid, 4+/5 Biceps, 4+/5 Triceps, 5/5 Grip RLE: HF 4/5, KE 4/5, ADF 4/5, APF 4/5 LLE: HF 5/5, KE 5/5, ADF 5/5, APF 5/5   SENSORY: Normal to touch all 4 extremities   Coordination: Normal finger to nose intact b/l, slow on R   Increased tone R biceps  Assessment/Plan: 1. Functional deficits which require 3+ hours per day of interdisciplinary therapy in a comprehensive inpatient rehab setting. Physiatrist is providing close team supervision and 24 hour management of active medical problems listed below. Physiatrist and rehab team continue to assess barriers to discharge/monitor patient  progress toward functional and medical goals  Care Tool:  Bathing    Body parts bathed by patient: Right arm, Left arm, Chest, Abdomen, Right upper leg, Left upper leg, Front perineal area, Face   Body parts bathed by helper: Buttocks, Front perineal area, Left lower leg, Right lower leg     Bathing assist Assist Level: Moderate Assistance - Patient 50 - 74%     Upper Body Dressing/Undressing Upper body dressing   What is the patient wearing?: Pull over shirt    Upper body assist Assist Level: Moderate Assistance - Patient 50 - 74%    Lower Body Dressing/Undressing Lower body dressing      What is the patient wearing?: Pants, Underwear/pull up     Lower body assist  Assist for lower body dressing: Maximal Assistance - Patient 25 - 49%     Toileting Toileting    Toileting assist Assist for toileting: Maximal Assistance - Patient 25 - 49%     Transfers Chair/bed transfer  Transfers assist     Chair/bed transfer assist level: Moderate Assistance - Patient 50 - 74%     Locomotion Ambulation   Ambulation assist      Assist level: Minimal Assistance - Patient > 75% Assistive device: Walker-rolling Max distance: 100'   Walk 10 feet activity   Assist     Assist level: Minimal Assistance - Patient > 75% Assistive device: Walker-rolling   Walk 50 feet activity   Assist    Assist level: Minimal Assistance - Patient > 75% Assistive device: Walker-rolling    Walk 150 feet activity   Assist    Assist level: Minimal Assistance - Patient > 75% Assistive device: Walker-rolling    Walk 10 feet on uneven surface  activity   Assist     Assist level: Minimal Assistance - Patient > 75% Assistive device: Walker-rolling   Wheelchair     Assist Is the patient using a wheelchair?: Yes Type of Wheelchair: Manual    Wheelchair assist level: Dependent - Patient 0%      Wheelchair 50 feet with 2 turns activity    Assist        Assist Level: Dependent - Patient 0%   Wheelchair 150 feet activity     Assist      Assist Level: Dependent - Patient 0%   Blood pressure 126/77, pulse 79, temperature 98 F (36.7 C), resp. rate 15, SpO2 98%.  Medical Problem List and Plan: 1. Functional deficits secondary to left MCA scattered infarcts with left ICA and left MCA occlusion s/p IR with TICI2c likely due to hypercoagulable state in setting of cancer             -patient may shower, cover incision/nephrostomy tubes             -ELOS/Goals: 12-16 days, PT/OT sup to min A, SLP supervision             -Continue CIR 2.  Antithrombotics: -DVT/anticoagulation:  Mechanical: Sequential compression devices, below knee  Bilateral lower extremities Pharmaceutical: Eliquis  5mg  BID             -antiplatelet therapy: N/A 3. Pain Management: Tylenol  prn  4. Mood/Behavior/Sleep: LCSW to follow for evaluation and support when available.              -antipsychotic agents: N/A 5. Neuropsych/cognition: This patient may be capable of making decisions on his own behalf. 6. Skin/Wound Care: routine pressure relief measures   7. Fluids/Electrolytes/Nutrition: Monitor I/O  -  Per speech past swallow study continue regular diet w/ Ensure and multivitamin supplements-- SLP ordered 8/9  8.  Bladder Cancer s/p nephrostomy tubes: stage IV, metastatic to the bone by MRI of the spine on 11/08/2023.  -Hx of prostate CA, on androgen therapy- leupron q6mos-- follows with [facility] Hem[facility]/Onc -UTI: Ceftriaxone  1g daily ends 8/10-- done  9. AKI: Monitor BMP w/GFR  Cr 1.22--1.42--1.52>> 1.40 on 8/9. Monitor fluid intake  10.  Anemia: Hemoglobin stable post PRBC 2 units Hgb 9.9-- stable on admission  11. HTN: hx of orthostasis on 8/4 resolved after IV fluids--home amlodipine  5 mg daily resumed--Carvdilol 12.5 mg not resumed - resume as needed. Avoid hypotension  -11/26/23 BPs mostly ok, monitor trend-- improving 8/10, monitor  Vitals:   11/25/23 1551 11/25/23 1934 11/26/23 0445 11/26/23 1439  BP: 130/84 131/67 (!) 148/79 135/78   11/26/23 1945 11/27/23 0552 11/27/23 0900  BP: (!) 144/84 134/80 126/77     12. DVT: hx of multiple BL DVT and PE had IVC filter placed 2014 --resumed Eliquis   -US  LE positive for age indeterminate DVTs: RT - PopV and PTV LT - CFV, PFV, FV, PopV, Gastrocs   13. HLD: LDL 112, goal < 70, atorvastatin  80mg  daily  14. Obesity: Educate on diet and weight loss to promote overall health and mobility.  Body mass index is 33.06 kg/m.  15. Hx of laryngeal cancer s/p radiation therapy   LOS: 2 days A FACE TO FACE EVALUATION WAS PERFORMED  [address] 11/27/2023, 11:09 AM

## 2023-11-27 NOTE — Plan of Care (Signed)
  Problem: Consults Goal: RH STROKE PATIENT EDUCATION Description: See Patient Education module for education specifics  Outcome: Progressing   Problem: RH BOWEL ELIMINATION Goal: RH STG MANAGE BOWEL WITH ASSISTANCE Description: STG Manage Bowel with mod I  Assistance. Outcome: Progressing Goal: RH STG MANAGE BOWEL W/MEDICATION W/ASSISTANCE Description: STG Manage Bowel with Medication with mod I Assistance. Outcome: Progressing   Problem: RH BLADDER ELIMINATION Goal: RH STG MANAGE BLADDER WITH ASSISTANCE Description: STG Manage Bladder With min Assistance Outcome: Progressing Goal: RH STG MANAGE BLADDER WITH EQUIPMENT WITH ASSISTANCE Description: STG Manage Bladder With Equipment With  min Assistance Outcome: Progressing   Problem: RH SAFETY Goal: RH STG ADHERE TO SAFETY PRECAUTIONS W/ASSISTANCE/DEVICE Description: STG Adhere to Safety Precautions With cues Assistance/Device. Outcome: Progressing   Problem: RH PAIN MANAGEMENT Goal: RH STG PAIN MANAGED AT OR BELOW PT'S PAIN GOAL Description: Pain < 4 with prns Outcome: Progressing   Problem: RH KNOWLEDGE DEFICIT Goal: RH STG INCREASE KNOWLEDGE OF HYPERTENSION Description: Patient and spouse will be able to manage HTN using educational resources for medications and dietary modification independently Outcome: Progressing Goal: RH STG INCREASE KNOWLEGDE OF HYPERLIPIDEMIA Description: Patient and spouse will be able to manage HLD using educational resources for medications and dietary modification independently Outcome: Progressing Goal: RH STG INCREASE KNOWLEDGE OF STROKE PROPHYLAXIS Description: Patient and spouse will be able to manage secondary risks using educational resources for medications and dietary modification independently Outcome: Progressing

## 2023-11-28 ENCOUNTER — Encounter (HOSPITAL_COMMUNITY): Payer: Self-pay | Admitting: Physical Medicine & Rehabilitation

## 2023-11-28 DIAGNOSIS — I69320 Aphasia following cerebral infarction: Secondary | ICD-10-CM | POA: Diagnosis not present

## 2023-11-28 DIAGNOSIS — C679 Malignant neoplasm of bladder, unspecified: Secondary | ICD-10-CM

## 2023-11-28 DIAGNOSIS — I639 Cerebral infarction, unspecified: Secondary | ICD-10-CM | POA: Diagnosis not present

## 2023-11-28 DIAGNOSIS — I63312 Cerebral infarction due to thrombosis of left middle cerebral artery: Secondary | ICD-10-CM | POA: Diagnosis not present

## 2023-11-28 LAB — CBC WITH DIFFERENTIAL/PLATELET
Abs Immature Granulocytes: 0.32 K/uL — ABNORMAL HIGH (ref 0.00–0.07)
Basophils Absolute: 0.1 K/uL (ref 0.0–0.1)
Basophils Relative: 1 %
Eosinophils Absolute: 0.2 K/uL (ref 0.0–0.5)
Eosinophils Relative: 3 %
HCT: 29.8 % — ABNORMAL LOW (ref 39.0–52.0)
Hemoglobin: 9.5 g/dL — ABNORMAL LOW (ref 13.0–17.0)
Immature Granulocytes: 4 %
Lymphocytes Relative: 17 %
Lymphs Abs: 1.3 K/uL (ref 0.7–4.0)
MCH: 29.8 pg (ref 26.0–34.0)
MCHC: 31.9 g/dL (ref 30.0–36.0)
MCV: 93.4 fL (ref 80.0–100.0)
Monocytes Absolute: 0.5 K/uL (ref 0.1–1.0)
Monocytes Relative: 7 %
Neutro Abs: 5.6 K/uL (ref 1.7–7.7)
Neutrophils Relative %: 68 %
Platelets: 113 K/uL — ABNORMAL LOW (ref 150–400)
RBC: 3.19 MIL/uL — ABNORMAL LOW (ref 4.22–5.81)
RDW: 18.6 % — ABNORMAL HIGH (ref 11.5–15.5)
WBC: 8 K/uL (ref 4.0–10.5)
nRBC: 0 % (ref 0.0–0.2)

## 2023-11-28 LAB — BASIC METABOLIC PANEL WITH GFR
Anion gap: 10 (ref 5–15)
BUN: 30 mg/dL — ABNORMAL HIGH (ref 8–23)
CO2: 22 mmol/L (ref 22–32)
Calcium: 8.4 mg/dL — ABNORMAL LOW (ref 8.9–10.3)
Chloride: 109 mmol/L (ref 98–111)
Creatinine, Ser: 1.36 mg/dL — ABNORMAL HIGH (ref 0.61–1.24)
GFR, Estimated: 54 mL/min — ABNORMAL LOW (ref >60)
Glucose, Bld: 108 mg/dL — ABNORMAL HIGH (ref 70–99)
Potassium: 3.3 mmol/L — ABNORMAL LOW (ref 3.5–5.1)
Sodium: 141 mmol/L (ref 135–145)

## 2023-11-28 MED ORDER — CHLORHEXIDINE GLUCONATE CLOTH 2 % EX PADS
6.0000 | MEDICATED_PAD | Freq: Two times a day (BID) | CUTANEOUS | Status: DC
  Administered 2023-11-28 – 2023-11-29 (×6): 6 via TOPICAL

## 2023-11-28 MED ORDER — POTASSIUM CHLORIDE CRYS ER 20 MEQ PO TBCR: 40.0000 meq | EXTENDED_RELEASE_TABLET | Freq: Once | ORAL | Status: DC

## 2023-11-28 MED ORDER — POTASSIUM CHLORIDE CRYS ER 20 MEQ PO TBCR
40.0000 meq | EXTENDED_RELEASE_TABLET | Freq: Two times a day (BID) | ORAL | Status: AC
  Administered 2023-11-28 (×4): 40 meq via ORAL
  Filled 2023-11-28 (×2): qty 2

## 2023-11-28 NOTE — Progress Notes (Signed)
 Patient ID: Allen Macdonald, male   DOB: 10/24/1950, 73 y.o.   MRN: [id number] Met with the patient to review current medical situation, rehab process, team conference and plan of care. Discussed secondary risk management including HTN, HLD (LDL 112/Trig 101) and A1C (5.7) with  medications and dietary modification recommendations.  Patient reports wife takes care of the nephrostomy tubes.  Continue to follow along to address educational needs to facilitate preparation for discharge. Fredericka Barnie NOVAK

## 2023-11-28 NOTE — Progress Notes (Signed)
 Speech Language Pathology Daily Session Note  Patient Details  Name: Gevork Ayyad MRN: [id number] Date of Birth: July 17, 1950  Today's Date: 11/28/2023 SLP Individual Time: 1003-1057 SLP Individual Time Calculation (min): 54 min  Short Term Goals: Week 1: SLP Short Term Goal 1 (Week 1): Pt will complete mildly complex naming tasks w/ minA SLP Short Term Goal 2 (Week 1): Pt will follow mildly complex directions w/ minA SLP Short Term Goal 3 (Week 1): Pt will answer mildly complex Y/N questions w/ minA SLP Short Term Goal 4 (Week 1): Pt will complete written expression tasks w/ minA  Skilled Therapeutic Interventions: Skilled therapy session focused on communication goals. SLP targeted receptive language through mildly complex yes/no questions. Patient with 70% accuracy independently, with notably difficulty during questions involving before/after. SLP targeted expression through phrase completion task involving opposites. Patient with 80% accuracy given 3 options. Patient continues to present with suspected apraxia and expressive>receptive aphasia. Patient benefited from occasional rest breaks and reminders to breathe. Patient left in chair with alarm set and call bell in reach.   Pain None reported   Therapy/Group: Individual Therapy  Emit Kuenzel M.A., CCC-SLP 11/28/2023, 7:40 AM

## 2023-11-28 NOTE — Progress Notes (Signed)
 PROGRESS NOTE   Subjective/Complaints:  Pt doing well again, slept well, denies pain, LBM this morning, nephrostomy tubes doing fine, no other complaints or concerns.   ROS: as per HPI. Denies CP, SOB, abd pain, N/V/D/C, or any other complaints at this time.    Objective:   No results found. Recent Labs    11/28/23 0339  WBC 8.0  HGB 9.5*  HCT 29.8*  PLT 113*   Recent Labs    11/26/23 0455 11/28/23 0339  NA 142 141  K 4.0 3.3*  CL 113* 109  CO2 18* 22  GLUCOSE 103* 108*  BUN 26* 30*  CREATININE 1.40* 1.36*  CALCIUM  8.2* 8.4*    Urinalysis    Component Value Date/Time   COLORURINE YELLOW 11/20/2023 2204   APPEARANCEUR HAZY (A) 11/20/2023 2204   LABSPEC 1.026 11/20/2023 2204   PHURINE 5.0 11/20/2023 2204   GLUCOSEU NEGATIVE 11/20/2023 2204   HGBUR SMALL (A) 11/20/2023 2204   BILIRUBINUR NEGATIVE 11/20/2023 2204   KETONESUR NEGATIVE 11/20/2023 2204   PROTEINUR 100 (A) 11/20/2023 2204   NITRITE POSITIVE (A) 11/20/2023 2204   LEUKOCYTESUR LARGE (A) 11/20/2023 2204      Intake/Output Summary (Last 24 hours) at 11/28/2023 0824 Last data filed at 11/28/2023 0804 Gross per 24 hour  Intake 360 ml  Output 1525 ml  Net -1165 ml        Physical Exam: Vital Signs Blood pressure (!) 144/74, pulse 79, temperature 97.6 F (36.4 C), temperature source Oral, resp. rate 18, weight 103.3 kg, SpO2 100%.    General: No acute distress Mood and affect are appropriate Heart: Regular rate and rhythm no rubs murmurs or extra sounds Lungs: Clear to auscultation, breathing unlabored, no rales or wheezes Abdomen: Positive bowel sounds, soft nontender to palpation, nondistended Extremities: No clubbing, cyanosis, or edema Skin: No evidence of breakdown, no evidence of rash Neurologic: Cranial nerves II through XII intact, motor strength is 5/5 in bilateral deltoid, bicep, tricep, grip, hip flexor, knee extensors, ankle  dorsiflexor and plantar flexor Sensory exam normal sensation to light touch and proprioception in bilateral upper and lower extremities Cerebellar exam normal finger to nose to finger as well as heel to shin in bilateral upper and lower extremities Musculoskeletal: Full range of motion in all 4 extremities. No joint swelling   Speech- good comprehension, naming intact for glasses and stethoscope, knows it is Monday and took 1-2 incorrect answers to get August as month    Mental Status: AAOx person, place, not date. Able to answer situation with choices. Delayed responses  Speech/Languate: + Aphasia. Word finding difficulties. Mild dysarthria. Naming and repetition intact, follows simple commands      MOTOR: RUE: 3/5 Deltoid, 4-/5 Biceps, 4-/5 Triceps,4-/5 Grip LUE: 5/5 Deltoid, 5/5 Biceps, 5/5 Triceps, 5/5 Grip RLE: HF 4/5, KE 4/5, ADF 4/5, APF 4/5 LLE: HF 5/5, KE 5/5, ADF 5/5, APF 5/5   SENSORY: Normal to touch all 4 extremities    Increased tone R biceps  Assessment/Plan: 1. Functional deficits which require 3+ hours per day of interdisciplinary therapy in a comprehensive inpatient rehab setting. Physiatrist is providing close team supervision and 24 hour management of active medical  problems listed below. Physiatrist and rehab team continue to assess barriers to discharge/monitor patient progress toward functional and medical goals  Care Tool:  Bathing    Body parts bathed by patient: Right arm, Left arm, Chest, Abdomen, Right upper leg, Left upper leg, Front perineal area, Face   Body parts bathed by helper: Buttocks, Front perineal area, Left lower leg, Right lower leg     Bathing assist Assist Level: Moderate Assistance - Patient 50 - 74%     Upper Body Dressing/Undressing Upper body dressing   What is the patient wearing?: Pull over shirt    Upper body assist Assist Level: Moderate Assistance - Patient 50 - 74%    Lower Body Dressing/Undressing Lower body  dressing      What is the patient wearing?: Pants, Underwear/pull up     Lower body assist Assist for lower body dressing: Maximal Assistance - Patient 25 - 49%     Toileting Toileting    Toileting assist Assist for toileting: Maximal Assistance - Patient 25 - 49%     Transfers Chair/bed transfer  Transfers assist     Chair/bed transfer assist level: Moderate Assistance - Patient 50 - 74%     Locomotion Ambulation   Ambulation assist      Assist level: Minimal Assistance - Patient > 75% Assistive device: Walker-rolling Max distance: 100'   Walk 10 feet activity   Assist     Assist level: Minimal Assistance - Patient > 75% Assistive device: Walker-rolling   Walk 50 feet activity   Assist    Assist level: Minimal Assistance - Patient > 75% Assistive device: Walker-rolling    Walk 150 feet activity   Assist    Assist level: Minimal Assistance - Patient > 75% Assistive device: Walker-rolling    Walk 10 feet on uneven surface  activity   Assist     Assist level: Minimal Assistance - Patient > 75% Assistive device: Development worker, international aid     Assist Is the patient using a wheelchair?: Yes Type of Wheelchair: Manual    Wheelchair assist level: Dependent - Patient 0%      Wheelchair 50 feet with 2 turns activity    Assist        Assist Level: Dependent - Patient 0%   Wheelchair 150 feet activity     Assist      Assist Level: Dependent - Patient 0%   Blood pressure (!) 144/74, pulse 79, temperature 97.6 F (36.4 C), temperature source Oral, resp. rate 18, weight 103.3 kg, SpO2 100%.  Medical Problem List and Plan: 1. Functional deficits secondary to left MCA scattered infarcts with left ICA and left MCA occlusion s/p IR with TICI2c likely due to hypercoagulable state in setting of cancer             -patient may shower, cover incision/nephrostomy tubes             -ELOS/Goals: 12-16 days, PT/OT sup to min A,  SLP supervision             -Continue CIR 2.  Antithrombotics: -DVT/anticoagulation:  Mechanical: Sequential compression devices, below knee Bilateral lower extremities Pharmaceutical: Eliquis  5mg  BID             -antiplatelet therapy: N/A 3. Pain Management: Tylenol  prn  4. Mood/Behavior/Sleep: LCSW to follow for evaluation and support when available.              -antipsychotic agents: N/A 5. Neuropsych/cognition: This patient may be capable  of making decisions on his own behalf. 6. Skin/Wound Care: routine pressure relief measures   7. Fluids/Electrolytes/Nutrition: Monitor I/O  - Per speech past swallow study continue regular diet w/ Ensure and multivitamin supplements-- SLP ordered 8/9  8.  Bladder Cancer s/p nephrostomy tubes: stage IV, metastatic to the bone by MRI of the spine on 11/08/2023.  -Hx of prostate CA, on androgen therapy- leupron q6mos-- follows with [facility]Onc -UTI: Ceftriaxone  1g daily ends 8/10-- done  9. AKI: Monitor BMP w/GFR  Cr 1.22--1.42--1.52>> 1.40 on 8/9. Monitor fluid intake  10.  Anemia: Hemoglobin stable post PRBC 2 units Hgb 9.9-- stable on admission  11. HTN: hx of orthostasis on 8/4 resolved after IV fluids--home amlodipine  5 mg daily resumed--Carvdilol 12.5 mg not resumed - resume as needed. Avoid hypotension  -11/26/23 BPs mostly ok, monitor trend-- improving 8/10, monitor  Vitals:   11/25/23 1551 11/25/23 1934 11/26/23 0445 11/26/23 1439  BP: 130/84 131/67 (!) 148/79 135/78   11/26/23 1945 11/27/23 0552 11/27/23 0900 11/27/23 1313  BP: (!) 144/84 134/80 126/77 126/85   11/27/23 2100 11/28/23 0420  BP: 136/85 (!) 144/74     12. DVT: hx of multiple BL DVT and PE had IVC filter placed 2014 --resumed Eliquis   -US  LE positive for age indeterminate DVTs: RT - PopV and PTV LT - CFV, PFV, FV, PopV, Gastrocs   13. HLD: LDL 112, goal < 70, atorvastatin  80mg  daily  14. Obesity: Educate on diet and weight loss to promote overall health and mobility.   Body mass index is 33.06 kg/m.  15. Hx of laryngeal cancer s/p radiation therapy, voice sounds mildly hoarse    LOS: 3 days A FACE TO FACE EVALUATION WAS PERFORMED  Prentice FORBES Compton 11/28/2023, 8:24 AM

## 2023-11-28 NOTE — IPOC Note (Signed)
 Overall Plan of Care [facility]) Patient Details Name: Allen Macdonald MRN: [id number] DOB: 12/29/1950  Admitting Diagnosis: CVA (cerebral vascular accident) [facility])  Hospital Problems: Principal Problem:   CVA (cerebral vascular accident) [facility])     Functional Problem List: Nursing Bladder, Safety, Bowel, Endurance, Medication Management, Pain  PT Balance, Behavior, Edema, Endurance, Motor, Perception, Safety, Sensory  OT Balance, Endurance, Perception, Vision, Motor, Safety, Cognition, Sensory, Pain  SLP Linguistic, Motor  TR         Basic ADL's: OT Toileting, Grooming, Bathing, Dressing     Advanced  ADL's: OT None     Transfers: PT Bed Mobility, Bed to Chair, Customer service manager, Tub/Shower     Locomotion: PT Ambulation, Stairs     Additional Impairments: OT Fuctional Use of Upper Extremity  SLP Communication comprehension, expression    TR      Anticipated Outcomes Item Anticipated Outcome  Self Feeding Mod I  Swallowing      Basic self-care  SUP  Toileting  SUP/CGA   Bathroom Transfers SUP/CGA  Bowel/Bladder  manage bowel w mod I and bladder w min assist  Transfers  supervision  Locomotion  supervision ambulatory  Communication  supervisionA  Cognition     Pain  Pain < 4 with prns  Safety/Judgment  manage safety w cues   Therapy Plan: PT Intensity: Minimum of 1-2 x/day ,45 to 90 minutes PT Frequency: 5 out of 7 days PT Duration Estimated Length of Stay: 12-14 days OT Intensity: Minimum of 1-2 x/day, 45 to 90 minutes OT Frequency: 5 out of 7 days OT Duration/Estimated Length of Stay: 12-16 days SLP Intensity: Minumum of 1-2 x/day, 30 to 90 minutes SLP Frequency: 3 to 5 out of 7 days SLP Duration/Estimated Length of Stay: 12-14 days   Team Interventions: Nursing Interventions Patient/Family Education, Medication Management, Bladder Management, Bowel Management, Disease Management/Prevention, Pain Management, Discharge Planning  PT interventions  Ambulation/gait training, Discharge planning, Functional mobility training, Psychosocial support, Therapeutic Activities, Visual/perceptual remediation/compensation, Balance/vestibular training, Neuromuscular re-education, Therapeutic Exercise, Disease management/prevention, Cognitive remediation/compensation, DME/adaptive equipment instruction, UE/LE Strength taining/ROM, Splinting/orthotics, Community reintegration, Equities trader education, Museum/gallery curator, UE/LE Coordination activities  OT Interventions Disease mangement/prevention, Warden/ranger, Cognitive remediation/compensation, Firefighter, Discharge planning, DME/adaptive equipment instruction, Functional mobility training, Neuromuscular re-education, Functional electrical stimulation, Pain management, Patient/family education, Psychosocial support, Self Care/advanced ADL retraining, Skin care/wound managment, Splinting/orthotics, Therapeutic Activities, Therapeutic Exercise, UE/LE Strength taining/ROM, UE/LE Coordination activities, Visual/perceptual remediation/compensation  SLP Interventions Multimodal communication approach, Speech/Language facilitation, Cueing hierarchy, Functional tasks, Therapeutic Activities, Patient/family education, Internal/external aids  TR Interventions    SW/CM Interventions Discharge Planning, Patient/Family Education, Psychosocial Support   Barriers to Discharge MD  Medical stability and aphasia   Nursing Decreased caregiver support, Home environment access/layout 1 level 3 ste bil rail w spouse who has low vision/does not drive and able to provide supervision assist  PT Inaccessible home environment, Decreased caregiver support, Home environment access/layout 3 STE, limited caregiver support (wife is visually impaired)  OT None    SLP      SW Decreased caregiver support, Lack of/limited family support, Community education officer for SNF coverage     Team Discharge Planning: Destination: PT-Home  ,OT- Home , SLP-Home Projected Follow-up: PT- , OT-  Home health OT, SLP-Outpatient SLP, Home Health SLP Projected Equipment Needs: PT-To be determined, OT- To be determined, SLP-None recommended by SLP Equipment Details: PT- , OT-  Patient/family involved in discharge planning: PT- Patient,  OT-Patient, SLP-Patient  MD ELOS: 12-16d Medical Rehab Prognosis:  Excellent Assessment: The patient has been admitted for CIR therapies with the diagnosis of CVA. The team will be addressing functional mobility, strength, stamina, balance, safety, adaptive techniques and equipment, self-care, bowel and bladder mgt, patient and caregiver education, electrolyte disturbance. Goals have been set at Supervision. Anticipated discharge destination is home with assist .        See Team Conference Notes for weekly updates to the plan of care

## 2023-11-28 NOTE — Progress Notes (Signed)
 Inpatient Rehabilitation  Patient information reviewed and entered into eRehab system by Burnard Mealing, OTR/L, Rehab Quality Coordinator.   Information including medical coding, functional ability and quality indicators will be reviewed and updated through discharge.

## 2023-11-28 NOTE — Progress Notes (Signed)
 Physical Therapy Session Note  Patient Details  Name: Allen Macdonald MRN: [id number] Date of Birth: 1950/11/02  Today's Date: 11/28/2023 PT Individual Time: 1310-1405 PT Individual Time Calculation (min): 55 min   Short Term Goals: Week 1:  PT Short Term Goal 1 (Week 1): Pt will complete transfers with CGA consistently PT Short Term Goal 2 (Week 1): Pt will complete up/down 4 steps with CGA and BHRs PT Short Term Goal 3 (Week 1): Pt will ambulate 150' with RW CGA   Skilled Therapeutic Interventions/Progress Updates:  Patient seated upright in recliner with BLE elevated on entrance to room. Patient alert and agreeable to PT session. Mixes few words but is aware that is not finding desired words and requires time to find.   Patient with no pain complaint at start of session.  Therapeutic Activity/ NMR: Transfers: Pt performed sit<>stand and stand pivot transfers throughout session with Min/ Mod A initially. Provided with education and biomechanical process of rise to stand and descend to sit with no UE use. Blocked practice with pt able to perform with improved anterior weight shift through hip hinge and then improves to CGA/ MinA with hands on knees. Stands to no AD and able to complete pivot stepping with encouragement and ultimately CGA.   Gait Training:  Pt ambulated >200' x2 using RW with CGA and close w/c follow. Demonstrated flexed posture and improving step height throughout of first bout. With second bout, pt demos fatigue but pushes self to reach room despite vc for use of w/c for rest and energy conservation. Reminded pt to slow pace, focus on foot clearance.  Neuromuscular Re-ed: NMR facilitated during session with focus on standing balance, motor control. Pt guided in toe touches to 4 step using RLE initially. Has difficulty with reaching and removing foot from step with single effort. Focus on increasing effort and by end of practice pt can place foot on step in one effort  consistently and removes with one effort x1 but with improved quality of movement.   NMR performed for improvements in motor control and coordination, balance, sequencing, judgement, and self confidence/ efficacy in performing all aspects of mobility at highest level of independence.   Patient seated upright in recliner with BLE elevated at end of session with brakes locked, seat pad alarm set, and all needs within reach.   Therapy Documentation Precautions:  Precautions Precautions: Fall, Other (comment) Recall of Precautions/Restrictions: Intact Precaution/Restrictions Comments: bilat nephrostomy bags Restrictions Weight Bearing Restrictions Per Provider Order: No  Pain:  No pain related this session.   Therapy/Group: Individual Therapy  Mliss DELENA Milliner PT, DPT, CSRS 11/28/2023, 2:25 PM

## 2023-11-28 NOTE — Progress Notes (Addendum)
 Occupational Therapy Session Note  Patient Details  Name: Allen Macdonald MRN: [id number] Date of Birth: Nov 16, 1950  Today's Date: 11/28/2023 OT Individual Time: 9167-9053 OT Individual Time Calculation (min): 74 min    Short Term Goals: Week 1:  OT Short Term Goal 1 (Week 1): Pt will complete UB dressing with MIN A OT Short Term Goal 2 (Week 1): Pt will complete toileting with MIN A using LRAD OT Short Term Goal 3 (Week 1): Pt will complete toilet transfers with CGA using LRAD OT Short Term Goal 4 (Week 1): Pt will complete LB dressing MIN A using AE and LRAD PRN  Skilled Therapeutic Interventions/Progress Updates:  Pt greeted seated EOB, pt agreeable to OT intervention.      Transfers/bed mobility/functional mobility:  Pt completed stand pivots with RW and mINA.  pt completed sit>stands with initial MOD A but progressed to MIN A with repetition. Pt did best with LUE Pushing up from w/c and RUE on RW.   Pt completed ~ 30 ft of functional ambulation with RW and MINA with chair follow for safety.   Therapeutic activity:  Pt completed dynamic reaching task with pt instructed to stand with RW and then match cones to discs on table with RUE, noted decreased motor planning in RUE with pt having difficulty mostly with releasing objects with RUE. Pt completed task with MIN A for balance.  Noted shakiness in legs with pt reporting dizziness in standing, assessed BP from sitting- 129/71( 88) HR 80   Pt completed second standing reaching task with pt instructed to match colored squigz to matching letters on mirror with RUE, emphasis on verbalizing correct letter and color, pt completed matching task with 90% accuracy, continues to present with impaired grasp in RUE having difficulty with proprioception. Increased demand noted when asked to state color of squigz and letter reaching for with increased ataxia noted in BLEs when increasing cognitive demand during task.   Pt continues to report  dizziness needing to sit down although BP 123/63( 81) HR 84   Worked on seated RUE FMC with pt instructed to duplicate geometric pattern with RUE only. Pt completed 3/3 parts with 100% accuracy with increased time and effort.     ADLs:  LB dressing: donned pants from EOB with MODA, pt needed assist threading BLEs and pulling pants to waist line on R side.  Footwear: donned shoes/socks with total A for time mgmt.    Assessments:   FAST-UL Outcome Measure  Hand-to-mouth (HtM) Movement Starting Position: Participant seated on a standard chair without armrests. Trunk leaning on back support of chair. Both hands placed in pronated position on the ipsilateral middle thigh. Feet placed flat on the floor. If participants have any difficulty in understanding instructions (i.e. aphasia) a visual demonstration is suggested. For each of the 5 tasks of the FAST-UL, the subject at first performs the movement with the less affected UL and then with the affected one. The movement can be repeated 3 times and the best score of the three attempts is assigned.   Instructions: Each subject is asked to move the hand towards the mouth, touch it with fingertips and return to the thigh. Motor task occurs without moving the trunk off the back support and without moving the head toward the hand.   Scoring: Clinical score from 0 to 3 is provided by comparing affected side with less affected one as follows: 0 = no movement at all. 1 = The movement task is not completed (less of  50% of the contralateral HtM movement). 2 = The movement task is not completed (more of 50% of the contralateral HtM movement but the mouth is not reached) or the movement task is completed with compensations. If the mouth is touched with the wrist or the palm or the movement is performed with head or trunk compensations (flexion of the head and trunk towards the hand) the score is 2.   3 = movement carried out at 100% of the contralateral  HtM movement. HtM occurs with adequate shoulder flexion and abduction, elbow flexion, and forearm supination. The mouth is touched with fingertips.  Patient Score: 3   Reach to Target (RtT) Movement Starting Position: Same starting conditions of HtM movement. Instructions: Each subject is asked to move the hand toward a target (i.e. the hand of the examiner) located in front of the subject in the ipsilateral workspace at shoulder height, at a distance corresponding to 100% of the fully extended UL within arm's reach (less affected arm as reference). Participants have to reach, touch the target, and return. Motor task occurs without moving the trunk off the back support. Scoring: Clinical score from 0 to 3 is provided by comparing affected side with less affected one as follows: 0 = no movement at all. 1 = The movement task is not completed (less of 50% of the contralateral RtT movement).  2 = The movement task is not completed (more of 50% of the contralateral RtT movement but the target is not reached) or the movement task is completed with compensations (i.e. the trunk loses contact with the back support of the chair with forward displacement, shoulder flexion occurs with excessive scapular elevation, or shoulder excessive abduction). If the target is reached with trunk or shoulder compensations for inadequate elbow and finger extension the score is 2.  3 = movement performed at 100% of the contralateral RtT. The target is reached with adequate shoulder flexion, elbow, wrist and finger extension.  Patient Score: 3   Prono-supination (PS) Movement Starting Position: Same starting conditions of HtM movement. Instructions: Motor task occurs without moving the trunk anteriorly or laterally, the medial side of the humerus is against the body, the forearm is fully pronated with the hand resting on the thigh. Scoring: Clinical score from 0 to 3 is provided by comparing paretic side with less  affected one as follows: 0 = no movement at all. 1 = The movement task is not completed (less of 50% of the contralateral PS movement).  2 = The movement task is not completed (more of 50% of the contralateral PS movement but the forearm is not fully supinated) or the movement task is completed with compensations (i.e. excessive trunk inclination, shoulder abduction). If the movement is completed with compensations at elbow, shoulder or trunk level the score is 2. 3 = movement performed at 100% of the contralateral PS (complete supination of the forearm with the dorsal part of the hand in contact with the thigh).   Patient Score: 3    Grasp and Release (GaR) Movement Starting position: Participant seated on a standard chair. Hip and knees in 90 flexion, feet flat on the floor. Upper limb (UL) resting on a table in front of the participant with approximately 90 elbow flexion, forearm pronated and fingers in a relaxed extended and adducted position.  Instructions: The subject performs a grasping movement of a cylindrical rigid glass (at least 6 cm diameter) placed proximally to an imaginary line connecting the distal joints of thumb and  index finger. The subject is asked to grasp the glass, lift it at least 2 cm (elbow remains in contact with the table), and release it. Scoring:  Clinical score from 0 to 3 is provided by comparing affected side with less affected one as follows: 0 = No movement. The grasp is not possible. 1 = The movement task is not completed (less of 50% of the task). Some prehension is possible but the grasp is not sufficiently stable to lift the object; the grasp can be performed with the use of the less affected hand only to stabilize the glass for inadequate hand/finger opening and the release is not possible. Some hand opening is required otherwise the score is 0. 2 = The movement task is not completed (more of 50% of the task). The object is grasped and lifted but it falls or  the task is completed using alternative grasping strategies (i.e. multi-pulpar, palmar, digito palmar; grasping and releasing of the object is possible with abnormal orientation of the wrist and fingers toward the object and the forearm is lifted off the table). 3 = The task is completed using the expected pattern (normal orientation of fingers or wrist toward the object, the grasp occurs with thumb and fingers in opposition, forearm supination, elbow flexion; thumb abduction and finger extension to release the object).  Patient Score: 3    Pinch and Release (PaR) Movement Starting position: Same starting conditions of GaR movement The participant performs a PaR movement of a pen placed on a table in the midline of an imaginary line connecting the distal joints of thumb and index finger. The participants asked to pinch the pen with the tips of thumb and index finger, lift it at least 2 cm (elbow remains in contact with the table), and release it. Clinical score from 0 to 3 is provided by comparing affected side with non-affected one as follows: 0 = No movement. The pinch is not possible. 1 = The movement task is not completed (less of 50% of the task). Some prehension is possible but the pinch is not sufficiently stable to lift the object; the pinch occurs with the use of the less affected hand to stabilize the object for inadequate finger opening and the release is not possible. Some fingers movement is required otherwise the score is 0. 2 = The movement task is not completed (more of 50% of the task). The object is pinched and lifted but it falls or the task is completed using alternative pinching strategies (e.g. pinching with all the fingers, tripod pinch, pinching and releasing of the object is possible with abnormal orientation of fingers and wrist toward the object and the forearm is lifted off the table). 3 = The task is completed using the expected pattern (normal orientation of fingers or  wrist toward the object, the pinch occurs with opposition of pads of index finger and thumb, and wrist extension).  Patient Score: 3   Total score: 15                 Ended session with pt seated in recliner with all needs within reach and chair alarm activated.                    Therapy Documentation Precautions:  Precautions Precautions: Fall, Other (comment) Recall of Precautions/Restrictions: Intact Precaution/Restrictions Comments: bilat nephrostomy bags Restrictions Weight Bearing Restrictions Per Provider Order: No  Pain: No pain    Therapy/Group: Individual Therapy  Ronal Gift [facility] 11/28/2023,  12:05 PM

## 2023-11-28 NOTE — Progress Notes (Signed)
 Inpatient Rehabilitation Care Coordinator Assessment and Plan Patient Details  Name: Allen Macdonald MRN: [id number] Date of Birth: 1950-12-22  Today's Date: 11/28/2023  Hospital Problems: Principal Problem:   CVA (cerebral vascular accident) [facility])  Past Medical History:  Past Medical History:  Diagnosis Date   Abnormal stress test minimal ischemia involving apex 05/01/2019   Bladder cancer (HCC)    Dilated cardiomyopathy (HCC)    Essential hypertension 12/18/2015   History of DVT (deep vein thrombosis) 04/22/2017   History of pulmonary embolism 12/18/2015   Laryngeal cancer (HCC) squamous cell 02/25/2020   PC (prostate cancer) (HCC) 08/28/2019   Past Surgical History:  Past Surgical History:  Procedure Laterality Date   CATARACT EXTRACTION Bilateral    INSERTION PROSTATE RADIATION SEED  2003   IR CT HEAD LTD  11/20/2023   IR PERCUTANEOUS ART THROMBECTOMY/INFUSION INTRACRANIAL INC DIAG ANGIO  11/20/2023   IR PERCUTANEOUS ART THROMBECTOMY/INFUSION INTRACRANIAL INC DIAG ANGIO  11/21/2023   IR US  GUIDE VASC ACCESS RIGHT  11/20/2023   IR US  GUIDE VASC ACCESS RIGHT  11/21/2023   PROSTATE SURGERY     RADIOLOGY WITH ANESTHESIA N/A 11/20/2023   Procedure: RADIOLOGY WITH ANESTHESIA;  Surgeon: Radiologist, Medication, MD;  Location: [facility] OR;  Service: Radiology;  Laterality: N/A;   RADIOLOGY WITH ANESTHESIA N/A 11/21/2023   Procedure: RADIOLOGY WITH ANESTHESIA;  Surgeon: Radiologist, Medication, MD;  Location: [facility] OR;  Service: Radiology;  Laterality: N/A;   TONSILLECTOMY     Venacava Filter     WISDOM TOOTH EXTRACTION     Social History:  reports that he has never smoked. He has never used smokeless tobacco. He reports current alcohol use of about 14.0 standard drinks of alcohol per week. He reports that he does not use drugs.  Family / Support Systems Marital Status: Married Patient Roles: Spouse Spouse/Significant Other: [name] [phone] Other Supports: Friends and  neighbors Anticipated Caregiver: karen Ability/Limitations of Caregiver: [name] has visual issues and can not drive pt was the driver Caregiver Availability: 24/7 Family Dynamics: Close with wife and both depende upon one another to meet their needs. They do have some friends who will check on and church members  Social History Preferred language: English Religion: None Cultural Background: NA Education: HS Health Literacy - How often do you need to have someone help you when you read instructions, pamphlets, or other written material from your doctor or pharmacy?: Never Writes: Yes Employment Status: Retired Marine scientist Issues: NA Guardian/Conservator: None-according to MD pt is capable of making his own decisions while here   Abuse/Neglect Abuse/Neglect Assessment Can Be Completed: Yes Physical Abuse: Denies Verbal Abuse: Denies Sexual Abuse: Denies Exploitation of patient/patient's resources: Denies Self-Neglect: Denies  Patient response to: Social Isolation - How often do you feel lonely or isolated from those around you?: Never  Emotional Status Pt's affect, behavior and adjustment status: Pt is motivated to do well and recover and regain his independence. He was dealing with his bladder cancer and now has had a CVA. He feels he is doing better and recovering from both. Recent Psychosocial Issues: other health issues Psychiatric History: No history seems to be coping appropriately and able to verbalize his feelings and concerns Substance Abuse History: NA  Patient / Family Perceptions, Expectations & Goals Pt/Family understanding of illness & functional limitations: Pt is able to explain his health issues and is taking each day at a time. He does talk with the MD when rounding and feels understands his plan moving forward. 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  Community [employer] Agencies: None Premorbid Home Care/DME Agencies: Other (Comment) (riser, rw, rollator, wc, shower seat) Transportation available at discharge: pt wife can not drvie due to visual issues Is the patient able to respond to transportation needs?: Yes In the past 12 months, has lack of transportation kept you from medical appointments or from getting medications?: No In the past 12 months, has lack of transportation kept you from meetings, work, or from getting things needed for daily living?: No  Discharge Planning Living Arrangements: Spouse/significant other Support Systems: Spouse/significant other, Friends/neighbors, Psychologist, clinical community Type of Residence: Private residence Insurance Resources: Media planner (specify) Radiographer, therapeutic) Financial Resources: Social Security, Family Support Financial Screen Referred: No Living Expenses: Own Money Management: Patient, Spouse Does the patient have any problems obtaining your medications?: No Home Management: both Patient/Family Preliminary Plans: Return home with wife who is able to assist but just doesn't drive. She has to get rides to visit here. Discussed team conference goals and ELOS. WIll update after team conference on Wednesday. Care Coordinator Barriers to Discharge: Decreased caregiver support, Lack of/limited family support, Insurance for SNF coverage Care Coordinator Anticipated Follow Up Needs: HH/OP  Clinical Impression Pleasant gentleman who is motivated to do well and recover from his stroke and issues with his bladder cancer. His wife is supportive and will do what she can to assist him, she does have visual issues. Will work on discharge needs.   [name] 11/28/2023, 10:53 AM

## 2023-11-29 ENCOUNTER — Inpatient Hospital Stay (HOSPITAL_COMMUNITY)
Admission: EM | Admit: 2023-11-29 | Discharge: 2023-12-03 | DRG: 024 | Disposition: A | Discharge status: Rehabilitation Facility | Source: Intra-hospital | Attending: Neurology | Admitting: Neurology

## 2023-11-29 ENCOUNTER — Inpatient Hospital Stay (HOSPITAL_COMMUNITY): Admitting: Anesthesiology

## 2023-11-29 ENCOUNTER — Encounter (HOSPITAL_COMMUNITY)
Admission: EM | Disposition: A | Discharge status: Rehabilitation Facility | Payer: Self-pay | Source: Home / Self Care | Attending: Neurology

## 2023-11-29 ENCOUNTER — Inpatient Hospital Stay (HOSPITAL_COMMUNITY)

## 2023-11-29 ENCOUNTER — Encounter (HOSPITAL_COMMUNITY): Payer: Self-pay

## 2023-11-29 ENCOUNTER — Other Ambulatory Visit (HOSPITAL_COMMUNITY): Payer: Self-pay | Admitting: Neuroradiology

## 2023-11-29 DIAGNOSIS — I63543 Cerebral infarction due to unspecified occlusion or stenosis of bilateral cerebellar arteries: Secondary | ICD-10-CM | POA: Diagnosis not present

## 2023-11-29 DIAGNOSIS — Z9841 Cataract extraction status, right eye: Secondary | ICD-10-CM

## 2023-11-29 DIAGNOSIS — R270 Ataxia, unspecified: Secondary | ICD-10-CM | POA: Diagnosis present

## 2023-11-29 DIAGNOSIS — G8191 Hemiplegia, unspecified affecting right dominant side: Secondary | ICD-10-CM | POA: Diagnosis not present

## 2023-11-29 DIAGNOSIS — R4781 Slurred speech: Secondary | ICD-10-CM | POA: Diagnosis not present

## 2023-11-29 DIAGNOSIS — D649 Anemia, unspecified: Secondary | ICD-10-CM | POA: Diagnosis present

## 2023-11-29 DIAGNOSIS — I69398 Other sequelae of cerebral infarction: Secondary | ICD-10-CM | POA: Diagnosis not present

## 2023-11-29 DIAGNOSIS — R9082 White matter disease, unspecified: Secondary | ICD-10-CM | POA: Diagnosis not present

## 2023-11-29 DIAGNOSIS — I63312 Cerebral infarction due to thrombosis of left middle cerebral artery: Secondary | ICD-10-CM | POA: Diagnosis not present

## 2023-11-29 DIAGNOSIS — Z8546 Personal history of malignant neoplasm of prostate: Secondary | ICD-10-CM | POA: Diagnosis not present

## 2023-11-29 DIAGNOSIS — D6859 Other primary thrombophilia: Secondary | ICD-10-CM | POA: Diagnosis not present

## 2023-11-29 DIAGNOSIS — Z9842 Cataract extraction status, left eye: Secondary | ICD-10-CM

## 2023-11-29 DIAGNOSIS — N179 Acute kidney failure, unspecified: Secondary | ICD-10-CM | POA: Diagnosis not present

## 2023-11-29 DIAGNOSIS — Z86718 Personal history of other venous thrombosis and embolism: Secondary | ICD-10-CM

## 2023-11-29 DIAGNOSIS — D6869 Other thrombophilia: Secondary | ICD-10-CM | POA: Diagnosis present

## 2023-11-29 DIAGNOSIS — I651 Occlusion and stenosis of basilar artery: Secondary | ICD-10-CM | POA: Diagnosis present

## 2023-11-29 DIAGNOSIS — I63512 Cerebral infarction due to unspecified occlusion or stenosis of left middle cerebral artery: Secondary | ICD-10-CM | POA: Diagnosis not present

## 2023-11-29 DIAGNOSIS — Z8249 Family history of ischemic heart disease and other diseases of the circulatory system: Secondary | ICD-10-CM

## 2023-11-29 DIAGNOSIS — R29711 NIHSS score 11: Secondary | ICD-10-CM | POA: Diagnosis not present

## 2023-11-29 DIAGNOSIS — K31811 Angiodysplasia of stomach and duodenum with bleeding: Secondary | ICD-10-CM | POA: Diagnosis not present

## 2023-11-29 DIAGNOSIS — I63132 Cerebral infarction due to embolism of left carotid artery: Secondary | ICD-10-CM

## 2023-11-29 DIAGNOSIS — I10 Essential (primary) hypertension: Secondary | ICD-10-CM | POA: Diagnosis not present

## 2023-11-29 DIAGNOSIS — I110 Hypertensive heart disease with heart failure: Secondary | ICD-10-CM | POA: Diagnosis not present

## 2023-11-29 DIAGNOSIS — D61818 Other pancytopenia: Secondary | ICD-10-CM | POA: Diagnosis not present

## 2023-11-29 DIAGNOSIS — D630 Anemia in neoplastic disease: Secondary | ICD-10-CM | POA: Diagnosis not present

## 2023-11-29 DIAGNOSIS — R29898 Other symptoms and signs involving the musculoskeletal system: Secondary | ICD-10-CM | POA: Diagnosis not present

## 2023-11-29 DIAGNOSIS — R4701 Aphasia: Secondary | ICD-10-CM | POA: Diagnosis not present

## 2023-11-29 DIAGNOSIS — I509 Heart failure, unspecified: Secondary | ICD-10-CM

## 2023-11-29 DIAGNOSIS — R29710 NIHSS score 10: Secondary | ICD-10-CM | POA: Diagnosis present

## 2023-11-29 DIAGNOSIS — C7951 Secondary malignant neoplasm of bone: Secondary | ICD-10-CM | POA: Diagnosis present

## 2023-11-29 DIAGNOSIS — I69351 Hemiplegia and hemiparesis following cerebral infarction affecting right dominant side: Secondary | ICD-10-CM | POA: Diagnosis not present

## 2023-11-29 DIAGNOSIS — I6522 Occlusion and stenosis of left carotid artery: Secondary | ICD-10-CM | POA: Diagnosis not present

## 2023-11-29 DIAGNOSIS — Z8551 Personal history of malignant neoplasm of bladder: Secondary | ICD-10-CM

## 2023-11-29 DIAGNOSIS — Z936 Other artificial openings of urinary tract status: Secondary | ICD-10-CM | POA: Diagnosis not present

## 2023-11-29 DIAGNOSIS — E669 Obesity, unspecified: Secondary | ICD-10-CM | POA: Diagnosis not present

## 2023-11-29 DIAGNOSIS — I639 Cerebral infarction, unspecified: Secondary | ICD-10-CM | POA: Diagnosis not present

## 2023-11-29 DIAGNOSIS — I6322 Cerebral infarction due to unspecified occlusion or stenosis of basilar arteries: Secondary | ICD-10-CM | POA: Diagnosis not present

## 2023-11-29 DIAGNOSIS — I69319 Unspecified symptoms and signs involving cognitive functions following cerebral infarction: Secondary | ICD-10-CM | POA: Diagnosis not present

## 2023-11-29 DIAGNOSIS — R29810 Facial weakness: Secondary | ICD-10-CM | POA: Diagnosis present

## 2023-11-29 DIAGNOSIS — Z79899 Other long term (current) drug therapy: Secondary | ICD-10-CM | POA: Diagnosis not present

## 2023-11-29 DIAGNOSIS — I69391 Dysphagia following cerebral infarction: Secondary | ICD-10-CM | POA: Diagnosis not present

## 2023-11-29 DIAGNOSIS — I63412 Cerebral infarction due to embolism of left middle cerebral artery: Secondary | ICD-10-CM | POA: Diagnosis not present

## 2023-11-29 DIAGNOSIS — R1310 Dysphagia, unspecified: Secondary | ICD-10-CM | POA: Diagnosis present

## 2023-11-29 DIAGNOSIS — Z91030 Bee allergy status: Secondary | ICD-10-CM

## 2023-11-29 DIAGNOSIS — E785 Hyperlipidemia, unspecified: Secondary | ICD-10-CM

## 2023-11-29 DIAGNOSIS — D696 Thrombocytopenia, unspecified: Secondary | ICD-10-CM | POA: Diagnosis not present

## 2023-11-29 DIAGNOSIS — I6350 Cerebral infarction due to unspecified occlusion or stenosis of unspecified cerebral artery: Secondary | ICD-10-CM | POA: Diagnosis not present

## 2023-11-29 DIAGNOSIS — R29705 NIHSS score 5: Secondary | ICD-10-CM | POA: Diagnosis not present

## 2023-11-29 DIAGNOSIS — C679 Malignant neoplasm of bladder, unspecified: Secondary | ICD-10-CM | POA: Diagnosis present

## 2023-11-29 DIAGNOSIS — R471 Dysarthria and anarthria: Secondary | ICD-10-CM | POA: Diagnosis present

## 2023-11-29 DIAGNOSIS — Z923 Personal history of irradiation: Secondary | ICD-10-CM

## 2023-11-29 DIAGNOSIS — C61 Malignant neoplasm of prostate: Secondary | ICD-10-CM | POA: Diagnosis present

## 2023-11-29 DIAGNOSIS — Z8521 Personal history of malignant neoplasm of larynx: Secondary | ICD-10-CM

## 2023-11-29 DIAGNOSIS — Z86711 Personal history of pulmonary embolism: Secondary | ICD-10-CM | POA: Diagnosis not present

## 2023-11-29 DIAGNOSIS — I6782 Cerebral ischemia: Secondary | ICD-10-CM | POA: Diagnosis not present

## 2023-11-29 DIAGNOSIS — I959 Hypotension, unspecified: Secondary | ICD-10-CM | POA: Diagnosis not present

## 2023-11-29 DIAGNOSIS — Z7901 Long term (current) use of anticoagulants: Secondary | ICD-10-CM | POA: Diagnosis not present

## 2023-11-29 DIAGNOSIS — I672 Cerebral atherosclerosis: Secondary | ICD-10-CM | POA: Diagnosis not present

## 2023-11-29 DIAGNOSIS — R29704 NIHSS score 4: Secondary | ICD-10-CM | POA: Diagnosis not present

## 2023-11-29 DIAGNOSIS — I420 Dilated cardiomyopathy: Secondary | ICD-10-CM | POA: Diagnosis present

## 2023-11-29 DIAGNOSIS — R29707 NIHSS score 7: Secondary | ICD-10-CM | POA: Diagnosis not present

## 2023-11-29 DIAGNOSIS — R29736 NIHSS score 36: Secondary | ICD-10-CM | POA: Diagnosis not present

## 2023-11-29 DIAGNOSIS — I69320 Aphasia following cerebral infarction: Secondary | ICD-10-CM | POA: Diagnosis not present

## 2023-11-29 DIAGNOSIS — M2022 Hallux rigidus, left foot: Secondary | ICD-10-CM

## 2023-11-29 DIAGNOSIS — D62 Acute posthemorrhagic anemia: Secondary | ICD-10-CM | POA: Diagnosis not present

## 2023-11-29 DIAGNOSIS — I6602 Occlusion and stenosis of left middle cerebral artery: Secondary | ICD-10-CM | POA: Diagnosis not present

## 2023-11-29 DIAGNOSIS — I358 Other nonrheumatic aortic valve disorders: Secondary | ICD-10-CM | POA: Diagnosis not present

## 2023-11-29 HISTORY — PX: IR US GUIDE VASC ACCESS RIGHT: IMG2390

## 2023-11-29 HISTORY — PX: IR PERCUTANEOUS ART THROMBECTOMY/INFUSION INTRACRANIAL INC DIAG ANGIO: IMG6087

## 2023-11-29 HISTORY — PX: RADIOLOGY WITH ANESTHESIA: SHX6223

## 2023-11-29 HISTORY — PX: IR THROMBECT SEC MECH MOD SED: IMG2299

## 2023-11-29 LAB — COMPREHENSIVE METABOLIC PANEL WITH GFR
ALT: 70 U/L — ABNORMAL HIGH (ref 0–44)
AST: 49 U/L — ABNORMAL HIGH (ref 15–41)
Albumin: 2.7 g/dL — ABNORMAL LOW (ref 3.5–5.0)
Alkaline Phosphatase: 116 U/L (ref 38–126)
Anion gap: 9 (ref 5–15)
BUN: 25 mg/dL — ABNORMAL HIGH (ref 8–23)
CO2: 19 mmol/L — ABNORMAL LOW (ref 22–32)
Calcium: 8.4 mg/dL — ABNORMAL LOW (ref 8.9–10.3)
Chloride: 111 mmol/L (ref 98–111)
Creatinine, Ser: 1.1 mg/dL (ref 0.61–1.24)
GFR, Estimated: >60 mL/min (ref >60)
Glucose, Bld: 102 mg/dL — ABNORMAL HIGH (ref 70–99)
Potassium: 4.8 mmol/L (ref 3.5–5.1)
Sodium: 139 mmol/L (ref 135–145)
Total Bilirubin: 0.9 mg/dL (ref 0.0–1.2)
Total Protein: 5.7 g/dL — ABNORMAL LOW (ref 6.5–8.1)

## 2023-11-29 LAB — GLUCOSE, CAPILLARY: Glucose-Capillary: 104 mg/dL — ABNORMAL HIGH (ref 70–99)

## 2023-11-29 LAB — CBC
HCT: 28 % — ABNORMAL LOW (ref 39.0–52.0)
Hemoglobin: 9 g/dL — ABNORMAL LOW (ref 13.0–17.0)
MCH: 29.8 pg (ref 26.0–34.0)
MCHC: 32.1 g/dL (ref 30.0–36.0)
MCV: 92.7 fL (ref 80.0–100.0)
Platelets: 119 K/uL — ABNORMAL LOW (ref 150–400)
RBC: 3.02 MIL/uL — ABNORMAL LOW (ref 4.22–5.81)
RDW: 18.5 % — ABNORMAL HIGH (ref 11.5–15.5)
WBC: 6.7 K/uL (ref 4.0–10.5)
nRBC: 0 % (ref 0.0–0.2)

## 2023-11-29 LAB — POTASSIUM: Potassium: 4 mmol/L (ref 3.5–5.1)

## 2023-11-29 LAB — MRSA NEXT GEN BY PCR, NASAL: MRSA by PCR Next Gen: NOT DETECTED

## 2023-11-29 SURGERY — RADIOLOGY WITH ANESTHESIA
Anesthesia: General

## 2023-11-29 MED ORDER — ACETAMINOPHEN 160 MG/5ML PO SOLN: 650.0000 mg | ORAL | Status: DC | PRN

## 2023-11-29 MED ORDER — ACETAMINOPHEN 650 MG RE SUPP: 650.0000 mg | RECTAL | Status: DC | PRN

## 2023-11-29 MED ORDER — SODIUM CHLORIDE 0.9 % IV BOLUS: 250.0000 mL | INTRAVENOUS | Status: DC | PRN

## 2023-11-29 MED ORDER — ACETAMINOPHEN 325 MG PO TABS: 650.0000 mg | ORAL_TABLET | ORAL | Status: DC | PRN

## 2023-11-29 MED ORDER — AMISULPRIDE (ANTIEMETIC) 5 MG/2ML IV SOLN: 10.0000 mg | Freq: Once | INTRAVENOUS | Status: DC | PRN

## 2023-11-29 MED ORDER — PROPOFOL 10 MG/ML IV BOLUS
INTRAVENOUS | Status: DC | PRN
  Administered 2023-11-29 (×2): 100 mg via INTRAVENOUS

## 2023-11-29 MED ORDER — SUGAMMADEX SODIUM 200 MG/2ML IV SOLN
INTRAVENOUS | Status: DC | PRN
  Administered 2023-11-29 (×2): 200 mg via INTRAVENOUS

## 2023-11-29 MED ORDER — CLEVIDIPINE BUTYRATE 0.5 MG/ML IV EMUL: 0.0000 mg/h | INTRAVENOUS | Status: DC

## 2023-11-29 MED ORDER — SUCCINYLCHOLINE CHLORIDE 200 MG/10ML IV SOSY
PREFILLED_SYRINGE | INTRAVENOUS | Status: DC | PRN
  Administered 2023-11-29 (×2): 100 mg via INTRAVENOUS

## 2023-11-29 MED ORDER — SODIUM CHLORIDE 0.9 % IV BOLUS: 250.0000 mL | INTRAVENOUS | Status: AC | PRN

## 2023-11-29 MED ORDER — IOHEXOL 350 MG/ML SOLN
75.0000 mL | Freq: Once | INTRAVENOUS | Status: AC | PRN
  Administered 2023-11-29 (×2): 75 mL via INTRAVENOUS

## 2023-11-29 MED ORDER — STROKE: EARLY STAGES OF RECOVERY BOOK
Freq: Once | Status: AC
  Filled 2023-11-29 (×2): qty 1

## 2023-11-29 MED ORDER — PHENYLEPHRINE 80 MCG/ML (10ML) SYRINGE FOR IV PUSH (FOR BLOOD PRESSURE SUPPORT)
PREFILLED_SYRINGE | INTRAVENOUS | Status: DC | PRN
  Administered 2023-11-29: 80 ug via INTRAVENOUS
  Administered 2023-11-29: 160 ug via INTRAVENOUS
  Administered 2023-11-29: 80 ug via INTRAVENOUS
  Administered 2023-11-29: 160 ug via INTRAVENOUS

## 2023-11-29 MED ORDER — PANTOPRAZOLE SODIUM 40 MG IV SOLR
40.0000 mg | Freq: Every day | INTRAVENOUS | Status: DC
  Administered 2023-11-29 (×2): 40 mg via INTRAVENOUS
  Filled 2023-11-29: qty 10

## 2023-11-29 MED ORDER — CHLORHEXIDINE GLUCONATE CLOTH 2 % EX PADS
6.0000 | MEDICATED_PAD | Freq: Every day | CUTANEOUS | Status: DC
  Administered 2023-11-29 – 2023-12-03 (×7): 6 via TOPICAL

## 2023-11-29 MED ORDER — LIDOCAINE HCL (CARDIAC) PF 100 MG/5ML IV SOSY
PREFILLED_SYRINGE | INTRAVENOUS | Status: DC | PRN
Start: 2023-11-29 — End: 2023-11-29
  Administered 2023-11-29 (×2): 60 mg via INTRATRACHEAL

## 2023-11-29 MED ORDER — FENTANYL CITRATE (PF) 100 MCG/2ML IJ SOLN: 25.0000 ug | INTRAMUSCULAR | Status: DC | PRN

## 2023-11-29 MED ORDER — FENTANYL CITRATE (PF) 100 MCG/2ML IJ SOLN
INTRAMUSCULAR | Status: DC | PRN
  Administered 2023-11-29 (×2): 100 ug via INTRAVENOUS

## 2023-11-29 MED ORDER — IOHEXOL 300 MG/ML  SOLN
150.0000 mL | Freq: Once | INTRAMUSCULAR | Status: AC | PRN
  Administered 2023-11-29 (×2): 65 mL via INTRA_ARTERIAL

## 2023-11-29 MED ORDER — SODIUM CHLORIDE 0.9 % IV SOLN: INTRAVENOUS | Status: AC

## 2023-11-29 MED ORDER — FENTANYL CITRATE (PF) 100 MCG/2ML IJ SOLN
INTRAMUSCULAR | Status: AC
  Filled 2023-11-29: qty 2

## 2023-11-29 MED ORDER — SODIUM CHLORIDE 0.9 % IV SOLN: INTRAVENOUS | Status: DC | PRN

## 2023-11-29 MED ORDER — ROCURONIUM BROMIDE 10 MG/ML (PF) SYRINGE
PREFILLED_SYRINGE | INTRAVENOUS | Status: DC | PRN
  Administered 2023-11-29 (×2): 30 mg via INTRAVENOUS

## 2023-11-29 MED ORDER — FENTANYL CITRATE (PF) 250 MCG/5ML IJ SOLN: INTRAMUSCULAR | Status: DC | PRN

## 2023-11-29 MED ORDER — ONDANSETRON HCL 4 MG/2ML IJ SOLN: 4.0000 mg | Freq: Once | INTRAMUSCULAR | Status: DC | PRN

## 2023-11-29 MED ORDER — PHENYLEPHRINE HCL-NACL 20-0.9 MG/250ML-% IV SOLN
INTRAVENOUS | Status: DC | PRN
  Administered 2023-11-29 (×4): 25 ug/min via INTRAVENOUS

## 2023-11-29 NOTE — Plan of Care (Signed)
Transfer to acute care. ?

## 2023-11-29 NOTE — Progress Notes (Signed)
 Went into patient room at about 1615 to check on and see if he needed anything before dinner trays arrived. Patient had pronounced right facial droop, garbled speech, and unable to lift right arm. Patient also presented with left gaze. At this point he was not able to follow commands. Asked NT to get a set of vital signs and notified the MD and NP of change in status via chat. Immediately called for a code stroke and NP came to the unit and we went to patient's room while waiting on code stroke team. Staff assisted patient back to bed, recorded vital signs and checked blood sugar. Code stroke team arrived and patient was transported to CT with Rapid response nurse, this RN, and code stroke nurse. Patient the sent to IR and will transferred to ICU.  Harlene Schimke, RN3, BSN, CRRN, [facility] Inpatient Rehabilitation

## 2023-11-29 NOTE — Progress Notes (Signed)
 Patient arrived to PACU 1855 unable to attach to monitor, unable to release PACU orders. Patient placement and ED registration notified.  Patient transferred to ICU at 2006.  RN Toribio given report and notified there was difficulty with charting and he was included in the secure chat communication with MD.

## 2023-11-29 NOTE — Anesthesia Preprocedure Evaluation (Addendum)
 Anesthesia Evaluation  Patient identified by MRN, date of birth, ID band Patient awake    Reviewed: Allergy  & Precautions, Patient's Chart, lab work & pertinent test resultsPreop documentation limited or incomplete due to emergent nature of procedure.  Airway Mallampati: III       Dental no notable dental hx.    Pulmonary PE   Pulmonary exam normal        Cardiovascular hypertension, Pt. on medications +CHF and + DVT  Normal cardiovascular exam     Neuro/Psych CVA    GI/Hepatic ,,,(+)     substance abuse  alcohol use  Endo/Other  negative endocrine ROS    Renal/GU Renal InsufficiencyRenal disease     Musculoskeletal   Abdominal  (+) + obese  Peds  Hematology  (+) Blood dyscrasia (Eliquis ), anemia   Anesthesia Other Findings Code Stroke  Reproductive/Obstetrics                              Anesthesia Physical Anesthesia Plan  ASA: 3 and emergent  Anesthesia Plan: General   Post-op Pain Management:    Induction: Intravenous and Rapid sequence  PONV Risk Score and Plan: 2 and Ondansetron  and Dexamethasone   Airway Management Planned: Oral ETT  Additional Equipment:   Intra-op Plan:   Post-operative Plan: Extubation in OR  Informed Consent:      Only emergency history available  Plan Discussed with: CRNA  Anesthesia Plan Comments:          Anesthesia Quick Evaluation

## 2023-11-29 NOTE — Discharge Summary (Cosign Needed Addendum)
 Physician Discharge Summary  Patient ID: Allen Macdonald MRN: [id number] DOB/AGE: [birth year] 73 y.o.  Admit date: 11/25/2023 Discharge date: 11/29/2023  Discharge Diagnoses:  Principal Problem:   CVA (cerebral vascular accident) [facility]) Active Problems:   Essential hypertension   History of pulmonary embolism   PC (prostate cancer) (HCC)   Iron deficiency anemia due to chronic blood loss   Acute ischemic left MCA stroke (HCC)   Hyperlipidemia   AKI (acute kidney injury) (HCC)   UTI (urinary tract infection)   Dysphagia   Discharged Condition: critical  Significant Diagnostic Studies: CT ANGIO HEAD NECK W WO CM (CODE STROKE) Result Date: 11/29/2023 CLINICAL DATA:  Neuro deficit, acute, stroke suspected. Aphasia, right facial droop, and right arm weakness. EXAM: CT ANGIOGRAPHY HEAD AND NECK WITH AND WITHOUT CONTRAST TECHNIQUE: Multidetector CT imaging of the head and neck was performed using the standard protocol during bolus administration of intravenous contrast. Multiplanar CT image reconstructions and MIPs were obtained to evaluate the vascular anatomy. Carotid stenosis measurements (when applicable) are obtained utilizing NASCET criteria, using the distal internal carotid diameter as the denominator. RADIATION DOSE REDUCTION: This exam was performed according to the departmental dose-optimization program which includes automated exposure control, adjustment of the mA and/or kV according to patient size and/or use of iterative reconstruction technique. CONTRAST:  75mL OMNIPAQUE  IOHEXOL  350 MG/ML SOLN COMPARISON:  CTA head and neck 11/21/2023.  MRA head 11/22/2023. FINDINGS: CTA NECK FINDINGS Aortic arch: Normal variant aortic arch branching pattern with common origin of the brachiocephalic and left common carotid arteries. Widely patent arch vessel origins. Right carotid system: Patent with minimal calcified and soft plaque at the carotid bifurcation. No evidence of a significant stenosis or  dissection. Left carotid system: Patent with a small amount of calcified and soft plaque at the carotid bifurcation. No evidence of a significant stenosis or dissection. Vertebral arteries: Patent without evidence of a significant stenosis or dissection. Dominant left vertebral artery. Skeleton: Moderate to severe disc and facet degeneration in the cervical spine. Severe bilateral neural foraminal stenosis and at least mild spinal stenosis at C5-6 and C6-7. Other neck: No evidence of cervical lymphadenopathy or mass. Upper chest: Clear lung apices. Review of the MIP images confirms the above findings CTA HEAD FINDINGS Anterior circulation: The intracranial right internal carotid artery is patent with atherosclerosis resulting in at most mild siphon stenosis. The intracranial left ICA is patent proximally with nonstenotic atherosclerosis in the cavernous segment, however there is a new/recurrent thrombus in the ICA terminus resulting in severe stenosis. This involves the left M1 origin which appears occluded over a 3 mm long segment with reconstitution of the distal M1 segment and proximal M2 trunks. However, there is occlusion of multiple mid M2 and more distal left MCA branches in the posterior division. The right MCA and both ACAs are patent with diffuse branch vessel irregularity as well as asymmetric attenuation of distal right ACA branch vessels compared to the left. The right A1 segment is hypoplastic. No aneurysm is identified. Posterior circulation: The intracranial left vertebral artery is strongly dominant widely patent. The intracranial right vertebral artery is hypoplastic with moderate diffuse irregularity as well as an apparent severe stenosis distally which appears increased compared to the prior CTA, although the degree of stenosis may be accentuated by small vessel size and skull base artifact. The basilar artery is widely patent proximally, however there is a new severe stenosis or near occlusive  thrombus at its tip involving the origins of both PCAs which remain  patent. There is a patent left posterior communicating artery. No aneurysm is identified. Venous sinuses: As permitted by contrast timing, patent. Anatomic variants: Hypoplastic right A1 segment. Review of the MIP images confirms the above findings Findings discussed by telephone with Dr. Lindzen on 11/29/2023 at 5:01 p.m. IMPRESSION: 1. New/recurrent thrombus in the left ICA terminus with occlusion of the left M1 origin. Reconstitution of the distal left M1 segment, however there are multiple occluded mid M2 and more distal branches. 2. New severe stenosis or near occlusive thrombus at the basilar tip involving the origins of both PCAs. 3. Increased, severe stenosis of the distal V4 segment of the non-dominant right vertebral artery. 4. Mild cervical carotid atherosclerosis without significant stenosis. Electronically Signed   By: Dasie Hamburg M.D.   On: 11/29/2023 17:46   CT HEAD CODE STROKE WO CONTRAST Result Date: 11/29/2023 CLINICAL DATA:  Code stroke. Neuro deficit, acute, stroke suspected. Slurred speech, right facial droop, and right arm weakness. EXAM: CT HEAD WITHOUT CONTRAST TECHNIQUE: Contiguous axial images were obtained from the base of the skull through the vertex without intravenous contrast. RADIATION DOSE REDUCTION: This exam was performed according to the departmental dose-optimization program which includes automated exposure control, adjustment of the mA and/or kV according to patient size and/or use of iterative reconstruction technique. COMPARISON:  Head CT 11/21/2023 and MRI 11/22/2023 FINDINGS: Brain: Scattered small hypodensities involving cortex and subcortical white matter in the left frontal and parietal lobes, left insula, and left basal ganglia correspond to recent infarcts on MRI, with many of the smaller infarcts on MRI being occult by CT. No acute large territory infarct, intracranial hemorrhage, mass, midline  shift, or extra-axial fluid collection is identified. There is a background of mild chronic small vessel ischemia in the cerebral white matter. Cerebral volume is within normal limits for age. The ventricles are normal in size. Vascular: Calcified atherosclerosis at the skull base. No hyperdense vessel. Skull: No fracture or suspicious lesion. Sinuses/Orbits: Mucosal thickening in the right sphenoid sinus. Clear mastoid air cells. Bilateral cataract extraction. Other: None. ASPECTS ([place] Stroke Program Early CT Score) - Ganglionic level infarction (caudate, lentiform nuclei, internal capsule, insula, M1-M3 cortex): 7 (allowing for the known subacute left MCA infarcts) - Supraganglionic infarction (M4-M6 cortex): 3 Total score (0-10 with 10 being normal): 10 Findings discussed by telephone with Dr. Lindzen on 11/29/2023 at 5:01 p.m. IMPRESSION: Evolving subacute left MCA infarcts. No definite acute infarct or intracranial hemorrhage. Electronically Signed   By: Dasie Hamburg M.D.   On: 11/29/2023 17:05   CT HEAD WO CONTRAST ( ) Addendum Date: 11/27/2023 ** ADDENDUM #1 ** ADDENDUM: The study was reported has no acute intracranial abnormality. Therefore the aspects score equivalent is 0/10 [place] stroke program early CT (aspect) score Ganglionic (caudate, ic, Lentiform Nucleus, insula, M1-m3): 7 Supraganglionic (m4-m6): 3 Total: 10/10 ---------------------------------------------------- Electronically signed by: Lonni Necessary MD 11/27/2023 08:59 AM EDT RP Workstation: [id number]   Result Date: 11/27/2023 ** ORIGINAL REPORT ** EXAM: CT HEAD WITHOUT 11/21/2023 10:14:48 AM TECHNIQUE: CT of the head was performed without the administration of intravenous contrast. Automated exposure control, iterative reconstruction, and/or weight based adjustment of the mA/kV was utilized to reduce the radiation dose to as low as reasonably achievable. COMPARISON: MRI of the head dated 11/21/2023. CLINICAL HISTORY: Stroke,  follow up; worsening exam with aphasia and right arm weakness. FINDINGS: BRAIN AND VENTRICLES: No mass effect or midline shift. No extra-axial fluid collection. Gray-white differentiation is maintained. No hydrocephalus. Age-related cerebral volume loss present. Mild-to-moderate  periventricular and subcortical white matter disease. Focal area of increased density within the cortex of the left mid frontal lobe seen on image 20 of series 3, which may represent contrast staining from earlier CT angiogram. The brain is otherwise unremarkable. ORBITS: Patient is status post bilateral lens replacement. SINUSES AND MASTOIDS: Mild mucosal disease within the maxillary and sphenoid sinuses. SOFT TISSUES AND SKULL: No acute skull fracture. No acute soft tissue abnormality. VASCULATURE: Moderate calcific atheromatous disease within the carotid siphons. IMPRESSION: 1. No acute intracranial abnormality. 2. Age-related cerebral volume loss and mild-to-moderate periventricular and subcortical white matter disease. 3. Focal area of increased density within the cortex of the left mid frontal lobe, possibly representing contrast staining from earlier CT angiogram. Electronically signed by: Gerson berrigan 11/21/2023 10:23 AM EDT RP Workstation: [id number]   VAS US  TRANSCRANIAL DOPPLER W BUBBLES Result Date: 11/22/2023  Transcranial Doppler with Bubble Patient Name:  Allen Macdonald  Date of Exam:   11/22/2023 Medical Rec #: [id number]        Accession #:    [id number] Date of Birth: 05/18/1950        Patient Gender: M Patient Age:   65 years Exam Location:  [facility] Procedure:      VAS US  TRANSCRANIAL DOPPLER W BUBBLES Referring Phys: ARY XU --------------------------------------------------------------------------------  Indications: Embolic stroke, positive for DVT. History: Bladder cancer, on Eliquis . Comparison Study: No prior TCD Performing Technologist: Alberta Lis RVS  Examination Guidelines: A complete  evaluation includes B-mode imaging, spectral Doppler, color Doppler, and power Doppler as needed of all accessible portions of each vessel. Bilateral testing is considered an integral part of a complete examination. Limited examinations for reoccurring indications may be performed as noted.  Summary: No HITS at rest or during Valsalva. Negative transcranial Doppler Bubble study with no evidence of right to left intracardiac communication.  A vascular evaluation was performed. The right middle cerebral artery was studied. An IV was inserted into the patient's left AC. Verbal informed consent was obtained.  Diagnosing physician: ARY Cummins MD Electronically signed by ARY Cummins MD on 11/22/2023 at 2:42:49 PM.    Final    ECHOCARDIOGRAM COMPLETE Result Date: 11/22/2023    ECHOCARDIOGRAM REPORT   Patient Name:   Allen Macdonald Date of Exam: 11/22/2023 Medical Rec #:  [id number]       Height:       70.0 in Accession #:    [id number]      Weight:       230.4 lb Date of Birth:  17-Sep-1950       BSA:          2.217 m Patient Age:    73 years        BP:           138/65 mmHg Patient Gender: M               HR:           75 bpm. Exam Location:  Inpatient Procedure: 2D Echo, 3D Echo, Cardiac Doppler, Color Doppler and Strain Analysis            (Both Spectral and Color Flow Doppler were utilized during            procedure). Indications:    Stroke  History:        Patient has prior history of Echocardiogram examinations, most                 recent 02/11/2023. Cardiomyopathy, Signs/Symptoms:Dyspnea;  Risk                 Factors:Hypertension. History of pulmonary embolism.  Sonographer:    Philomena Daring Referring Phys: [id number] ROCKY BROCKS LEHNER IMPRESSIONS  1. Posterior lateral wall hypokinesis . Left ventricular ejection fraction, by estimation, is 50 to 55%. The left ventricle has low normal function. The left ventricle has no regional wall motion abnormalities. Left ventricular diastolic parameters were  normal.  2. Right  ventricular systolic function is normal. The right ventricular size is normal.  3. Left atrial size was mildly dilated.  4. The mitral valve is abnormal. Moderate mitral valve regurgitation. No evidence of mitral stenosis. Moderate mitral annular calcification.  5. The aortic valve is tricuspid. There is mild calcification of the aortic valve. There is mild thickening of the aortic valve. Aortic valve regurgitation is not visualized. Aortic valve sclerosis is present, with no evidence of aortic valve stenosis.  6. The inferior vena cava is normal in size with greater than 50% respiratory variability, suggesting right atrial pressure of 3 mmHg.  7. Agitated saline contrast bubble study was negative, with no evidence of any interatrial shunt. FINDINGS  Left Ventricle: Posterior lateral wall hypokinesis. Left ventricular ejection fraction, by estimation, is 50 to 55%. The left ventricle has low normal function. The left ventricle has no regional wall motion abnormalities. Strain was performed and the global longitudinal strain is indeterminate. The left ventricular internal cavity size was normal in size. There is no left ventricular hypertrophy. Left ventricular diastolic parameters were normal. Right Ventricle: The right ventricular size is normal. No increase in right ventricular wall thickness. Right ventricular systolic function is normal. Left Atrium: Left atrial size was mildly dilated. Right Atrium: Right atrial size was normal in size. Pericardium: There is no evidence of pericardial effusion. Mitral Valve: The mitral valve is abnormal. There is mild thickening of the mitral valve leaflet(s). There is mild calcification of the mitral valve leaflet(s). Moderate mitral annular calcification. Moderate mitral valve regurgitation. No evidence of mitral valve stenosis. Tricuspid Valve: The tricuspid valve is normal in structure. Tricuspid valve regurgitation is not demonstrated. No evidence of tricuspid stenosis.  Aortic Valve: The aortic valve is tricuspid. There is mild calcification of the aortic valve. There is mild thickening of the aortic valve. Aortic valve regurgitation is not visualized. Aortic valve sclerosis is present, with no evidence of aortic valve stenosis. Pulmonic Valve: The pulmonic valve was normal in structure. Pulmonic valve regurgitation is not visualized. No evidence of pulmonic stenosis. Aorta: The aortic root is normal in size and structure. Venous: The inferior vena cava is normal in size with greater than 50% respiratory variability, suggesting right atrial pressure of 3 mmHg. IAS/Shunts: No atrial level shunt detected by color flow Doppler. Agitated saline contrast was given intravenously to evaluate for intracardiac shunting. Agitated saline contrast bubble study was negative, with no evidence of any interatrial shunt. Additional Comments: 3D was performed not requiring image post processing on an independent workstation and was indeterminate.  LEFT VENTRICLE PLAX 2D LVIDd:         5.70 cm      Diastology LVIDs:         3.90 cm      LV e' medial:    8.59 cm/s LV PW:         1.10 cm      LV E/e' medial:  9.9 LV IVS:        1.10 cm  LV e' lateral:   12.00 cm/s LVOT diam:     1.70 cm      LV E/e' lateral: 7.1 LV SV:         42 LV SV Index:   19 LVOT Area:     2.27 cm  LV Volumes (MOD) LV vol d, MOD A2C: 113.0 ml LV vol d, MOD A4C: 144.0 ml LV vol s, MOD A2C: 43.8 ml LV vol s, MOD A4C: 51.4 ml LV SV MOD A2C:     69.2 ml LV SV MOD A4C:     144.0 ml LV SV MOD BP:      79.8 ml RIGHT VENTRICLE             IVC RV S prime:     14.50 cm/s  IVC diam: 1.20 cm TAPSE (M-mode): 3.3 cm LEFT ATRIUM             Index        RIGHT ATRIUM           Index LA diam:        3.80 cm 1.71 cm/m   RA Area:     14.20 cm LA Vol (A2C):   66.3 ml 29.91 ml/m  RA Volume:   29.20 ml  13.17 ml/m LA Vol (A4C):   45.9 ml 20.71 ml/m LA Biplane Vol: 57.3 ml 25.85 ml/m  AORTIC VALVE LVOT Vmax:   81.80 cm/s LVOT Vmean:  56.600  cm/s LVOT VTI:    0.185 m  AORTA Ao Root diam: 2.60 cm MITRAL VALVE MV Area (PHT): 3.85 cm    SHUNTS MV Decel Time: 197 msec    Systemic VTI:  0.18 m MV E velocity: 85.40 cm/s  Systemic Diam: 1.70 cm MV A velocity: 90.10 cm/s MV E/A ratio:  0.95 Maude Emmer MD Electronically signed by Maude Emmer MD Signature Date/Time: 11/22/2023/9:27:45 AM    Final    VAS US  LOWER EXTREMITY VENOUS (DVT) Result Date: 11/22/2023  Lower Venous DVT Study Patient Name:  Allen Macdonald  Date of Exam:   11/21/2023 Medical Rec #: [id number]        Accession #:    [id number] Date of Birth: 1950/09/14        Patient Gender: M Patient Age:   50 years Exam Location:  [facility] Procedure:      VAS US  LOWER EXTREMITY VENOUS (DVT) Referring Phys: ARY XU --------------------------------------------------------------------------------  Indications: Stroke.  Risk Factors: Cancer (bladder). Anticoagulation: Eliquis . Limitations: Bandages, poor ultrasound/tissue interface and patient immobility. Comparison Study: Previous RLEV on 09/07/2023 was positive for DVT (CFV, FV, PFV,                   PopV, PTV) Previous BLEV 06/27/2023 was negative for DVT,                   however exam was limited. Performing Technologist: Ezzie Potters RVT, RDMS  Examination Guidelines: A complete evaluation includes B-mode imaging, spectral Doppler, color Doppler, and power Doppler as needed of all accessible portions of each vessel. Bilateral testing is considered an integral part of a complete examination. Limited examinations for reoccurring indications may be performed as noted. The reflux portion of the exam is performed with the patient in reverse Trendelenburg.  +---------+---------------+---------+-----------+----------+-------------------+ RIGHT    CompressibilityPhasicitySpontaneityPropertiesThrombus Aging      +---------+---------------+---------+-----------+----------+-------------------+ CFV  Unable to visualize +---------+---------------+---------+-----------+----------+-------------------+ SFJ                                                   Unable to visualize +---------+---------------+---------+-----------+----------+-------------------+ FV Prox  Full           Yes      Yes                                      +---------+---------------+---------+-----------+----------+-------------------+ FV Mid   Full           Yes      Yes                                      +---------+---------------+---------+-----------+----------+-------------------+ FV DistalFull           Yes      Yes                                      +---------+---------------+---------+-----------+----------+-------------------+ PFV      Full                                                             +---------+---------------+---------+-----------+----------+-------------------+ POP      None           No       No                   Age Indeterminate   +---------+---------------+---------+-----------+----------+-------------------+ PTV      None           No       No                   Age Indeterminate   +---------+---------------+---------+-----------+----------+-------------------+ PERO     Full                                                             +---------+---------------+---------+-----------+----------+-------------------+   Right Technical Findings: Not visualized segments include CFV, SFJ - pressure dressing from thrombectomy. Popliteal vein not well visualized due to immobility  +---------+---------------+---------+-----------+----------+-----------------+ LEFT     CompressibilityPhasicitySpontaneityPropertiesThrombus Aging    +---------+---------------+---------+-----------+----------+-----------------+ CFV      None           No       No                   Age Indeterminate  +---------+---------------+---------+-----------+----------+-----------------+ SFJ      Partial                                                        +---------+---------------+---------+-----------+----------+-----------------+  FV Prox  None           No       Yes                  Age Indeterminate +---------+---------------+---------+-----------+----------+-----------------+ FV Mid   None           No       No                   Age Indeterminate +---------+---------------+---------+-----------+----------+-----------------+ FV DistalNone           No       No                   Age Indeterminate +---------+---------------+---------+-----------+----------+-----------------+ PFV      Partial        No       Yes                  Age Indeterminate +---------+---------------+---------+-----------+----------+-----------------+ POP      None           No       No                   Age Indeterminate +---------+---------------+---------+-----------+----------+-----------------+ PTV      Full                                                           +---------+---------------+---------+-----------+----------+-----------------+ PERO     Full                                                           +---------+---------------+---------+-----------+----------+-----------------+ Gastroc  None           No       No                   Age Indeterminate +---------+---------------+---------+-----------+----------+-----------------+ EIV      Full           Yes      Yes                                    +---------+---------------+---------+-----------+----------+-----------------+     Summary: RIGHT: - Findings consistent with age indeterminate deep vein thrombosis involving the right posterior tibial veins, and right popliteal vein.   LEFT: - Findings consistent with age indeterminate deep vein thrombosis involving the left common femoral vein, SF junction, left  femoral vein, left proximal profunda vein, and left popliteal vein. Findings consistent with age indeterminate intramuscular thrombus involving the left gastrocnemius veins.  *See table(s) above for measurements and observations. Electronically signed by Norman Serve on 11/22/2023 at 9:20:21 AM.    Final    MR ANGIO HEAD WO CONTRAST Result Date: 11/22/2023 EXAM: MR Angiography Head without intravenous Contrast. 11/22/2023 05:40:30 AM TECHNIQUE: Magnetic resonance angiography images of the head without intravenous contrast. Multiplanar 2D and 3D reformatted images are provided for review. COMPARISON: CT angio head and neck 11/21/2023. Cerebral arteriogram 11/21/2023. CLINICAL HISTORY: Stroke, follow up. FINDINGS: ANTERIOR CIRCULATION: A  left A1 segment is dominant. The anterior communicating artery is patent. The left MCA branch vessels are within normal limits on this study. No aneurysm. POSTERIOR CIRCULATION: A left vertebral artery is a dominant vessel. No aneurysm. IMPRESSION: 1. No significant stenosis of the intracranial vasculature. 2. No aneurysm. Electronically signed by: Lonni Necessary MD 11/22/2023 06:02 AM EDT RP Workstation: [id number]   MR BRAIN WO CONTRAST Result Date: 11/22/2023 EXAM: MRI BRAIN WITHOUT CONTRAST 11/22/2023 05:40:00 AM TECHNIQUE: Multiplanar multisequence MRI of the head/brain was performed without the administration of intravenous contrast. COMPARISON: None available. CLINICAL HISTORY: Stroke, follow up. Left MCA dissection. FINDINGS: BRAIN AND VENTRICLES: Scattered foci of restricted diffusion are present throughout the left MCA territory consistent with cortical and subcortical infarcts. Partial infarcts of the left caudate head and lentiform nuclei are present. T2 and FLAIR signal changes are consistent with the subacute timeframe of the infarcts. Susceptibility weighted images demonstrate signal change within the large cortical lesions and in the left caudate head lesion  consistent with petechial hemorrhage. Cortical thickening is present without other significant mass effect. Periventricular and subcortical T2 hyperintensities are otherwise mildly advanced for age. The sella is unremarkable. Normal flow voids. ORBITS: Bilateral lens replacements are noted. The globes and orbits are otherwise within normal limits. SINUSES AND MASTOIDS: Minimal fluid is present in the right maxillary sinus. Small bilateral mastoid effusions are present. BONES AND SOFT TISSUES: Normal marrow signal. No acute soft tissue abnormality. IMPRESSION: 1. Scattered foci of restricted diffusion throughout the left MCA territory consistent with cortical and subcortical infarcts, including partial infarcts of the left caudate head and lentiform nuclei. Findings are consistent with the subacute timeframe. 2. Petechial hemorrhage noted within the largest cortical lesions and the left caudate head. 3. Cortical thickening without other significant mass effect. 4. Mildly advanced periventricular and subcortical T2 hyperintensities for age. 5. Minimal fluid in the right maxillary sinus and small bilateral mastoid effusions. Electronically signed by: Lonni Necessary MD 11/22/2023 05:54 AM EDT RP Workstation: [id number]   IR PERCUTANEOUS ART THROMBECTOMY/INFUSION INTRACRANIAL INC DIAG ANGIO Result Date: 11/21/2023 INDICATION: Acute Large Vessel occlusion, left middle cerebral artery occlusion EXAM: Mechanical thrombectomy, CONSENT: Emergency consent was obtained from the patient's wife as the patient was unable to consent for themselves and family not immediately available. Dr. Ary Cummins and I agreed the procedure was emergent. MEDICATIONS: Intravenous Cangrelor , intra arterial verapamil  5 mg ANESTHESIA/SEDATION: General CONTRAST:  Forty ml Isovue 370 FLUOROSCOPY: Fluoroscopy time: 3 minutes 54 seconds Radiation Exposure Index (as provided by the fluoroscopic device): 72.05 MGy reference air Kerma COMPLICATIONS:  None immediate. TECHNIQUE: Maximal Sterile Barrier Technique was utilized including caps, mask, sterile gowns, sterile gloves, sterile drape, hand hygiene and skin antiseptic. A timeout was performed prior to the initiation of the procedure. Vessel catheterized: Left common carotid, left internal carotid FINDINGS: The carotid bifurcation is normal. There is an occlusion of the left middle cerebral artery. The anterior cerebral artery is normal. Good collaterals. PROCEDURE: Femoral access was obtained with ultrasound using direct visualization of the needle puncture into the artery. A 90 cm neuron max sheath was placed. The non MAC she was advanced over a 5 [place] penumbra select parents teen into the left internal carotid artery. The read 72 SENDIT catheter was advanced over a synchro support wire to the level of the occlusion and connected to suction. The catheter was then withdrawn under aspiration. This resulted in removal of the embolus. The follow-up arteriogram demonstrated a intimal flap within the left middle cerebral artery  M1 segment. 5 mg of verapamil  were administered intra-arterially. A repeat image was obtained which clearly demonstrates a dissection flap in the distal middle cerebral artery M1 segment. It appears to extend to both the inferior and superior divisions of the middle cerebral artery. The artery was observed for about 20 minutes after initiation of Cangrelor . There was no slowing of the flow. All of the proximal branches were patent, there was slow flow distally within several of the branches involving both the anterior and posterior middle cerebral artery divisions. I did not think the stent would likely improve the situation and would risk thrombosis of the artery and potentially 1 of the 2 divisions. Hemostasis was obtained with Angio-Seal. 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 Flow was restored with aspiration  thrombectomy. Initiation Cangrelor  in attempt to maintain vessel patency PLAN: Return to ICU. Continue intravenous Cangrelor  24 hours until repeat imaging Electronically Signed   By: Nancyann Burns M.D.   On: 11/21/2023 15:02   IR US  Guide Vasc Access Right Result Date: 11/21/2023 INDICATION: Acute Large Vessel occlusion, left middle cerebral artery occlusion EXAM: Mechanical thrombectomy, CONSENT: Emergency consent was obtained from the patient's wife as the patient was unable to consent for themselves and family not immediately available. Dr. Ary Cummins and I agreed the procedure was emergent. MEDICATIONS: Intravenous Cangrelor , intra arterial verapamil  5 mg ANESTHESIA/SEDATION: General CONTRAST:  Forty ml Isovue 370 FLUOROSCOPY: Fluoroscopy time: 3 minutes 54 seconds Radiation Exposure Index (as provided by the fluoroscopic device): 72.05 MGy reference air Kerma COMPLICATIONS: None immediate. TECHNIQUE: Maximal Sterile Barrier Technique was utilized including caps, mask, sterile gowns, sterile gloves, sterile drape, hand hygiene and skin antiseptic. A timeout was performed prior to the initiation of the procedure. Vessel catheterized: Left common carotid, left internal carotid FINDINGS: The carotid bifurcation is normal. There is an occlusion of the left middle cerebral artery. The anterior cerebral artery is normal. Good collaterals. PROCEDURE: Femoral access was obtained with ultrasound using direct visualization of the needle puncture into the artery. A 90 cm neuron max sheath was placed. The non MAC she was advanced over a 5 [place] penumbra select parents teen into the left internal carotid artery. The read 72 SENDIT catheter was advanced over a synchro support wire to the level of the occlusion and connected to suction. The catheter was then withdrawn under aspiration. This resulted in removal of the embolus. The follow-up arteriogram demonstrated a intimal flap within the left middle cerebral artery M1  segment. 5 mg of verapamil  were administered intra-arterially. A repeat image was obtained which clearly demonstrates a dissection flap in the distal middle cerebral artery M1 segment. It appears to extend to both the inferior and superior divisions of the middle cerebral artery. The artery was observed for about 20 minutes after initiation of Cangrelor . There was no slowing of the flow. All of the proximal branches were patent, there was slow flow distally within several of the branches involving both the anterior and posterior middle cerebral artery divisions. I did not think the stent would likely improve the situation and would risk thrombosis of the artery and potentially 1 of the 2 divisions. Hemostasis was obtained with Angio-Seal. 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 Flow was restored with aspiration thrombectomy. Initiation Cangrelor  in attempt to maintain vessel patency PLAN: Return to ICU. Continue intravenous Cangrelor  24 hours until repeat imaging Electronically Signed   By:  Nancyann Burns M.D.   On: 11/21/2023 15:02   CT ANGIO HEAD NECK W WO CM Result Date: 11/21/2023 EXAM: CTA HEAD AND NECK WITH AND WITHOUT 11/21/2023 01:49:04 PM TECHNIQUE: CTA of the head and neck was performed with and without the administration of intravenous contrast. Multiplanar 2D and/or 3D reformatted images are provided for review. Automated exposure control, iterative reconstruction, and/or weight based adjustment of the mA/kV was utilized to reduce the radiation dose to as low as reasonably achievable. Stenosis of the internal carotid arteries measured using NASCET criteria. COMPARISON: CT angiogram of the head and neck and CT perfusion of the head dated 11/20/2000. CLINICAL HISTORY: Stroke/TIA, determine embolic source. FINDINGS: CTA NECK: AORTIC ARCH AND ARCH VESSELS: Minimal calcific atheromatous disease within the aortic arch. No dissection or  arterial injury. No significant stenosis of the brachiocephalic or subclavian arteries. CERVICAL CAROTID ARTERIES: Mild calcific/noncalcific plaque within the origin of the right internal carotid artery, with no appreciable stenosis. Mild calcific plaque within the origin of the left internal carotid artery, also with no significant stenosis. No dissection or arterial injury. CERVICAL VERTEBRAL ARTERIES: The left vertebral artery is dominant. Both vertebral arteries are normal in caliber throughout their respective courses. No dissection, arterial injury, or significant stenosis. LUNGS AND MEDIASTINUM: Unremarkable. SOFT TISSUES: No acute abnormality. BONES: No acute abnormality. CTA HEAD: ANTERIOR CIRCULATION: Mild generalized cerebral volume loss and mild periventricular white matter disease. Calcification within the carotid siphons bilaterally and mild diffuse stenosis of the clinoid and cavernous segments of the right internal carotid artery. The endoluminal filling defect previously seen at the left ICA terminus is no longer present. The M1 segment of the left middle cerebral artery has recanalized in the interim and demonstrates moderate stenosis distally. It is also diffusely irregular. The M2 branches are patent. The right A1 segment is diminutive and diffusely irregular. No aneurysm. POSTERIOR CIRCULATION: No significant stenosis of the posterior cerebral arteries. No significant stenosis of the basilar artery. No significant stenosis of the vertebral arteries. No aneurysm. OTHER: No dural venous sinus thrombosis on this non-dedicated study. IMPRESSION: 1. No large vessel occlusion or aneurysm in the head or neck. There has been interval recanalization of the M1 segment of the left middle cerebral artery. 2. Moderate stenosis distally in the recanalized M1 segment of the left middle cerebral artery, which is also diffusely irregular. Electronically signed by: evalene coho 11/21/2023 02:39 PM EDT RP  Workstation: [id number]   CT ANGIO HEAD NECK W WO CM W PERF Result Date: 11/21/2023 EXAM: CTA Head and Neck with Perfusion 11/21/2023 10:14:48 AM TECHNIQUE: CTA of the head and neck was performed with the administration of intravenous contrast. 3D postprocessing with multiplanar reconstructions and MIPs was performed to evaluate the vascular anatomy. Automated exposure control, iterative reconstruction, and/or weight based adjustment of the mA/kV was utilized to reduce the radiation dose to as low as reasonably achievable. COMPARISON: CT angiogram of the head and neck dated 11/20/2023. CLINICAL HISTORY: Post thrombectomy worsening aphasia and right arm weakness. FINDINGS: CTA NECK: AORTIC ARCH AND ARCH VESSELS: No dissection or arterial injury. No significant stenosis of the brachiocephalic or subclavian arteries. CERVICAL CAROTID ARTERIES: Mild calcific plaque present within the origin of the left internal carotid artery, with no luminal stenosis. CERVICAL VERTEBRAL ARTERIES: No dissection, arterial injury, or significant stenosis. LUNGS AND MEDIASTINUM: Unremarkable. SOFT TISSUES: No acute abnormality. BONES: No acute abnormality. CTA HEAD: ANTERIOR CIRCULATION: Calcific plaque present within the carotid siphons bilaterally. There is an endoluminal filling defect within the  left ICA terminus. The left M1 branch is completely occluded at its origin. There is reconstitution of the M2 branches via collaterals. The left A1 is widely patent. POSTERIOR CIRCULATION: No significant stenosis of the posterior cerebral arteries. No significant stenosis of the basilar artery. No significant stenosis of the vertebral arteries. No aneurysm. OTHER: No dural venous sinus thrombosis on this non-dedicated study. CT PERFUSION: EXAM QUALITY: Exam quality is adequate with diagnostic perfusion maps. No significant motion artifact. Appropriate arterial inflow and venous outflow curves. CORE INFARCT (CBF<30% volume): 4 mL TOTAL  HYPOPERFUSION (Tmax>6s volume): 47 mL PENUMBRA: Mismatch volume: 43 mL Mismatch ratio: 11.8 Location: The infarct core is within the left temporal lobe and the penumbra involves the temporal, frontal and parietal lobes. IMPRESSION: 1. Left M1 branch occlusion at its origin with reconstitution of the M2 branches via collaterals. 2. Infarct core within the left temporal lobe and penumbra involving the temporal, frontal, and parietal lobes. 3. The above findings were communicated to ordering clinician Karna Geralds via Am[employer] paging at 10:51 AM 11/21/2023. Electronically signed by: evalene coho 11/21/2023 10:53 AM EDT RP Workstation: [id number]   MR BRAIN WO CONTRAST Result Date: 11/21/2023 EXAM: MRI BRAIN WITHOUT CONTRAST 11/21/2023 01:09:07 AM TECHNIQUE: Multiplanar multisequence MRI of the head/brain was performed without the administration of intravenous contrast. COMPARISON: None available. CLINICAL HISTORY: Stroke, follow up. FINDINGS: BRAIN AND VENTRICLES: There are multiple scattered small foci of abnormal diffusion restriction indicating acute ischemia within the left MCA (middle cerebral artery) territory, greatest in the frontal lobe. Additionally, there are punctate foci of acute ischemia in the right occipital lobe and right frontal white matter. Multifocal hyperintense T2-weighted signal within the cerebral white matter, most commonly due to chronic small vessel disease. No intracranial hemorrhage. No mass. No midline shift. No hydrocephalus. The sella is unremarkable. Normal flow voids. ORBITS: No acute abnormality. SINUSES AND MASTOIDS: No acute abnormality. BONES AND SOFT TISSUES: Normal marrow signal. No acute soft tissue abnormality. IMPRESSION: 1. Multiple scattered small foci of acute ischemia within the left MCA territory, greatest in the frontal lobe. 2. Punctate foci of acute ischemia in the right occipital lobe and right frontal white matter. 3. Multifocal hyperintense T2-weighted signal  within the cerebral white matter, most commonly due to chronic small vessel disease. Electronically signed by: Franky Stanford MD 11/21/2023 01:43 AM EDT RP Workstation: [id number]   IR PERCUTANEOUS ART THROMBECTOMY/INFUSION INTRACRANIAL INC DIAG ANGIO Result Date: 11/20/2023 PROCEDURE PERFORMED: 1. Cerebral angiography with stroke thrombectomy 2. Ultrasound guided vascular access 3. Cone beam CT for treatment planning COMPARISON:  CT angiogram of the head neck performed November 20, 2023 CLINICAL DATA:  73 year old male with acute ischemic stroke with symptoms of aphasia and right-sided weakness. NIH stroke scale measures 24. A multi disciplinary review was performed and the patient was offered stroke thrombectomy. INDICATION: Acute ischemic stroke. ANESTHESIA/SEDATION: General anesthesia was utilized for the procedure. CONTRAST:  Approximately 50 cc Ominipque 300 MEDICATIONS: Heparin  5000 units intravenously FLUOROSCOPY TIME:  Fluoroscopy Time: 12 minutes, (964 mGy). COMPLICATIONS: None immediate. BODY OF REPORT: Following a full explanation of the procedure along with the potential associated complications, an informed witnessed consent was obtained. The patient was then placed under general anesthesia by the Department of Anesthesiology at [facility]. The right femoral access site was prepped and draped in the usual sterile fashion. Ultrasound was used to study the right common femoral artery which was patent. Using real-time ultrasound guidance, a 19 gauge introducer needle was used to access the right common  femoral artery. Access was performed at 1054 a.m. A hard copy image ultrasound the saved and stored in PACS. Using this access, a 6 French sheath was placed in the descending thoracic aorta. Approximately 5000 units of heparin  was administered intravenously. Next, selective catheterization the left internal carotid artery was performed. A selective arteriogram was performed which demonstrated a  carotid terminus occlusion with partial perfusion of the ACA and complete malperfusion of the MCA territory. Pretreatment TICI score 1. Next, a penumbra 43 and red 68 catheter was used to selectively catheterize the left M1 segment. The first pass was performed at 1107. This did not achieve recanalization. A second pass was performed with a EMBO trap device position in the left M1 segment at 11:13. This did not result in reperfusion. A third pass was performed with the EMBO trap position in the left M1 segment at 11:20. This did not result in reperfusion. At this point, I transitioned to a zoom 71 catheter and solitaire device. The solitaire device was deployed in the left M1 segment at 11:34. This pass resulted in recanalization of the left MCA territory. No residual large vessel occlusion was observed. Slow flow was observed in a few small posterior parietal branches. Post treatment TICI score 2 C. After reviewing the imaging, I elected to terminate the procedure at this point. Evaluation of the right femoral access site demonstrated that the site was suitable for a closure device. A 6 French Angio-Seal device was deployed without complication. Cone beam CT was then performed to evaluate for intracranial hemorrhage and treatment planning. This demonstrated no evidence of significant acute intracranial hemorrhage. The patient was removed from anesthesia and transferred to recovery in stable condition. IMPRESSION: 1. Suction mechanical thrombectomy of left carotid terminus and left M1 occlusion. PLAN: 1. To ICU for routine postoperative supportive care. Electronically Signed   By: Maude Naegeli M.D.   On: 11/20/2023 12:37   IR US  Guide Vasc Access Right Result Date: 11/20/2023 PROCEDURE PERFORMED: 1. Cerebral angiography with stroke thrombectomy 2. Ultrasound guided vascular access 3. Cone beam CT for treatment planning COMPARISON:  CT angiogram of the head neck performed November 20, 2023 CLINICAL DATA:  73 year old  male with acute ischemic stroke with symptoms of aphasia and right-sided weakness. NIH stroke scale measures 24. A multi disciplinary review was performed and the patient was offered stroke thrombectomy. INDICATION: Acute ischemic stroke. ANESTHESIA/SEDATION: General anesthesia was utilized for the procedure. CONTRAST:  Approximately 50 cc Ominipque 300 MEDICATIONS: Heparin  5000 units intravenously FLUOROSCOPY TIME:  Fluoroscopy Time: 12 minutes, (964 mGy). COMPLICATIONS: None immediate. BODY OF REPORT: Following a full explanation of the procedure along with the potential associated complications, an informed witnessed consent was obtained. The patient was then placed under general anesthesia by the Department of Anesthesiology at [facility]. The right femoral access site was prepped and draped in the usual sterile fashion. Ultrasound was used to study the right common femoral artery which was patent. Using real-time ultrasound guidance, a 19 gauge introducer needle was used to access the right common femoral artery. Access was performed at 1054 a.m. A hard copy image ultrasound the saved and stored in PACS. Using this access, a 6 French sheath was placed in the descending thoracic aorta. Approximately 5000 units of heparin  was administered intravenously. Next, selective catheterization the left internal carotid artery was performed. A selective arteriogram was performed which demonstrated a carotid terminus occlusion with partial perfusion of the ACA and complete malperfusion of the MCA territory. Pretreatment TICI score  1. Next, a penumbra 43 and red 68 catheter was used to selectively catheterize the left M1 segment. The first pass was performed at 1107. This did not achieve recanalization. A second pass was performed with a EMBO trap device position in the left M1 segment at 11:13. This did not result in reperfusion. A third pass was performed with the EMBO trap position in the left M1 segment at  11:20. This did not result in reperfusion. At this point, I transitioned to a zoom 71 catheter and solitaire device. The solitaire device was deployed in the left M1 segment at 11:34. This pass resulted in recanalization of the left MCA territory. No residual large vessel occlusion was observed. Slow flow was observed in a few small posterior parietal branches. Post treatment TICI score 2 C. After reviewing the imaging, I elected to terminate the procedure at this point. Evaluation of the right femoral access site demonstrated that the site was suitable for a closure device. A 6 French Angio-Seal device was deployed without complication. Cone beam CT was then performed to evaluate for intracranial hemorrhage and treatment planning. This demonstrated no evidence of significant acute intracranial hemorrhage. The patient was removed from anesthesia and transferred to recovery in stable condition. IMPRESSION: 1. Suction mechanical thrombectomy of left carotid terminus and left M1 occlusion. PLAN: 1. To ICU for routine postoperative supportive care. Electronically Signed   By: Maude Naegeli M.D.   On: 11/20/2023 12:37   IR CT Head Ltd Result Date: 11/20/2023 PROCEDURE PERFORMED: 1. Cerebral angiography with stroke thrombectomy 2. Ultrasound guided vascular access 3. Cone beam CT for treatment planning COMPARISON:  CT angiogram of the head neck performed November 20, 2023 CLINICAL DATA:  73 year old male with acute ischemic stroke with symptoms of aphasia and right-sided weakness. NIH stroke scale measures 24. A multi disciplinary review was performed and the patient was offered stroke thrombectomy. INDICATION: Acute ischemic stroke. ANESTHESIA/SEDATION: General anesthesia was utilized for the procedure. CONTRAST:  Approximately 50 cc Ominipque 300 MEDICATIONS: Heparin  5000 units intravenously FLUOROSCOPY TIME:  Fluoroscopy Time: 12 minutes, (964 mGy). COMPLICATIONS: None immediate. BODY OF REPORT: Following a full  explanation of the procedure along with the potential associated complications, an informed witnessed consent was obtained. The patient was then placed under general anesthesia by the Department of Anesthesiology at [facility]. The right femoral access site was prepped and draped in the usual sterile fashion. Ultrasound was used to study the right common femoral artery which was patent. Using real-time ultrasound guidance, a 19 gauge introducer needle was used to access the right common femoral artery. Access was performed at 1054 a.m. A hard copy image ultrasound the saved and stored in PACS. Using this access, a 6 French sheath was placed in the descending thoracic aorta. Approximately 5000 units of heparin  was administered intravenously. Next, selective catheterization the left internal carotid artery was performed. A selective arteriogram was performed which demonstrated a carotid terminus occlusion with partial perfusion of the ACA and complete malperfusion of the MCA territory. Pretreatment TICI score 1. Next, a penumbra 43 and red 68 catheter was used to selectively catheterize the left M1 segment. The first pass was performed at 1107. This did not achieve recanalization. A second pass was performed with a EMBO trap device position in the left M1 segment at 11:13. This did not result in reperfusion. A third pass was performed with the EMBO trap position in the left M1 segment at 11:20. This did not result in reperfusion. At this point,  I transitioned to a zoom 71 catheter and solitaire device. The solitaire device was deployed in the left M1 segment at 11:34. This pass resulted in recanalization of the left MCA territory. No residual large vessel occlusion was observed. Slow flow was observed in a few small posterior parietal branches. Post treatment TICI score 2 C. After reviewing the imaging, I elected to terminate the procedure at this point. Evaluation of the right femoral access site  demonstrated that the site was suitable for a closure device. A 6 French Angio-Seal device was deployed without complication. Cone beam CT was then performed to evaluate for intracranial hemorrhage and treatment planning. This demonstrated no evidence of significant acute intracranial hemorrhage. The patient was removed from anesthesia and transferred to recovery in stable condition. IMPRESSION: 1. Suction mechanical thrombectomy of left carotid terminus and left M1 occlusion. PLAN: 1. To ICU for routine postoperative supportive care. Electronically Signed   By: Maude Naegeli M.D.   On: 11/20/2023 12:37   CT ANGIO HEAD NECK W WO CM (CODE STROKE) Result Date: 11/20/2023 CLINICAL DATA:  73 year old male code stroke presentation. EXAM: CT ANGIOGRAPHY HEAD AND NECK TECHNIQUE: Multidetector CT imaging of the head and neck was performed using the standard protocol during bolus administration of intravenous contrast. Multiplanar CT image reconstructions and MIPs were obtained to evaluate the vascular anatomy. Carotid stenosis measurements (when applicable) are obtained utilizing NASCET criteria, using the distal internal carotid diameter as the denominator. RADIATION DOSE REDUCTION: This exam was performed according to the departmental dose-optimization program which includes automated exposure control, adjustment of the mA and/or kV according to patient size and/or use of iterative reconstruction technique. CONTRAST:  75mL OMNIPAQUE  IOHEXOL  350 MG/ML SOLN COMPARISON:  Plain head CT 1005 hours today reported separately. FINDINGS: CTA NECK Skeleton: Advanced cervical spine degeneration. Degenerative right C3-C4 facet ankylosis. No acute osseous abnormality identified. Upper chest: Mild atelectasis. Right chest Port-A-Cath partially visible. Other neck: Nonvascular neck soft tissue spaces are within normal limits. Aortic arch: Partially visible 3 vessel arch. Right carotid system: Mild tortuosity. Minimal soft and calcified  plaque at the right carotid bifurcation. No stenosis. Left carotid system: Mild tortuosity. Mild soft and calcified plaque at the lateral left ICA origin without stenosis. Attenuated enhancement of the left ICA as it approaches the skull base. See intracranial findings below. Vertebral arteries: Non dominant right vertebral artery is patent to the skull base and negative. Visible proximal left subclavian artery, left vertebral artery origin, and dominant left vertebral arteries are patent and within normal limits. CTA HEAD Posterior circulation: Distal vertebral arteries and vertebrobasilar junction are patent, dominant left V4 segment. Diminutive right V4 distal to the patent right PICA origin. Diminutive left PICA. Left AICA also patent. Patent basilar artery without stenosis. Patent basilar tip, SCA and PCA origins. Posterior communicating arteries are diminutive or absent. Bilateral PCA branches are within normal limits. Anterior circulation: Right ICA siphon is patent. Moderate right siphon calcified plaque, but no significant right siphon stenosis. Patent right ICA terminus. Left ICA T occlusion at the terminus, well demonstrated on series 14, image 39. Upstream attenuated enhancement of the left ICA. Superimposed up to moderate left siphon calcified plaque. Reconstitution of the right ACA A1. Normal anterior communicating artery. However, the right ACA A1 is non dominant (same image). Bilateral ACA branch enhancement within normal limits. Right MCA M1 segment, bifurcation, right MCA branches appear patent with mild branch irregularity. Faintly enhancing left MCA M1 and bifurcation, with poor early left MCA collateralization (series 15, image  32). Venous sinuses: Grossly patent. Anatomic variants: Dominant left vertebral artery. Dominant Left ACA A1. Review of the MIP images confirms the above findings Preliminary results of Left ICA T occlusion were communicated to Dr. Vanessa at 10:17 am on 11/20/2023 by  text page via the [facility] messaging system. IMPRESSION: 1. Positive for Left ICA terminus Occlusion - ELVO. The left ACA A1 is dominant, but the ACA branches are reconstituted from the right side. Poor early reconstitution of the left MCA. 2. Other generally mild for age atherosclerosis in the head and neck, no other hemodynamically significant arterial stenosis. Preliminary results were communicated at 1017 hours, and final CTA report communicated to Dr. Vanessa on 11/20/2023 at 1024 hours. Electronically Signed   By: VEAR Hurst M.D.   On: 11/20/2023 10:41   CT HEAD CODE STROKE WO CONTRAST Result Date: 11/20/2023 EXAM: CT HEAD WITHOUT CONTRAST 11/20/2023 10:11:35 AM TECHNIQUE: CT of the head was performed without the administration of intravenous contrast. Automated exposure control, iterative reconstruction, and/or weight based adjustment of the mA/kV was utilized to reduce the radiation dose to as low as reasonably achievable. COMPARISON: None available. CLINICAL HISTORY: Neuro deficit, acute, stroke suspected. FINDINGS: BRAIN AND VENTRICLES: No acute hemorrhage. Gray-white differentiation is preserved. No hydrocephalus. No extra-axial collection. No mass effect or midline shift. There is mild periventricular white matter disease present. Aspect score is 10. ORBITS: No acute abnormality. SINUSES: No acute abnormality. SOFT TISSUES AND SKULL: No acute soft tissue abnormality. No skull fracture. Note: The above findings were communicated to the ordering clinician Dr. Sal Khaliqdina at 10:15 am Nov 20 2023 via the [facility] paging service. IMPRESSION: 1. No acute intracranial abnormality. 2. Mild periventricular white matter disease. Electronically signed by: evalene coho 11/20/2023 10:22 AM EDT RP Workstation: [id number]    Labs:  Basic Metabolic Panel: Recent Labs  Lab 11/23/23 9362 11/24/23 0553 11/25/23 0415 11/26/23 0455 11/28/23 0339 11/29/23 0411  NA 142 141 147* 142 141  --   K 3.3* 3.6 3.6 4.0  3.3* 4.0  CL 115* 114* 113* 113* 109  --   CO2 19* 18* 21* 18* 22  --   GLUCOSE 103* 114* 115* 103* 108*  --   BUN 18 23 20  26* 30*  --   CREATININE 1.22 1.42* 1.52* 1.40* 1.36*  --   CALCIUM  7.4* 7.9* 8.2* 8.2* 8.4*  --   MG 2.0 1.9  --   --   --   --   PHOS 2.1* 4.2  --   --   --   --     CBC: Recent Labs  Lab 11/24/23 0553 11/25/23 0415 11/28/23 0339  WBC 6.8 6.3 8.0  NEUTROABS  --   --  5.6  HGB 9.7* 9.9* 9.5*  HCT 28.9* 30.0* 29.8*  MCV 89.2 89.3 93.4  PLT 141* 143* 113*    CBG: Recent Labs  Lab 11/24/23 1117 11/24/23 2158 11/25/23 0630 11/25/23 1142 11/29/23 1633  GLUCAP 122* 106* 117* 148* 104*    Brief HPI:   Maxfield Gildersleeve is a 73 y.o. male  with PMHx of HTN, DVT/PE on Eliquis , bladder cancer s/p nephrostomy. Per chart review he had taken Eliquis  2.5 mg at bedtime, around 0815 he was unable to get up from the toilet and became mute. He presented to [facility] via EMS for right sided weakness, left gaze and aphasia. Code stroke was activated upon arrival. Ct head showed no intracranial abnormality. Neurology consulted, CTA head/neck demonstrated a terminal left ICA  occlusion with poor opacification of the left MCA branches. He was not a candidate for TNK with previous Eliquis  use. On 8/3 patient underwent emergent stent retriever assisted mechanical thrombectomy of left carotid T and M1 occlusions with TICI 2C by Dr. Ray. While working with PT on 8/4 he experienced left gaze, slurred speech and RUE weakness was note and neurology was consulted.  Stat CT head and CTA head and neck with perfusion which revealed left M1 occlusion,Infarct core within the left temporal lobe and penumbra involving the temporal, frontal, and parietal lobe. Code IR was activated and he underwent M1 Thrombectomy due to dissection of left M1 with TICI 2B vascularization, cangrelor  drip was initiated. Post intervention pt still have no improvement with right arm flaccidity and aphasia.  Repeat CTA head/neck showed no LVO. US  LE positive for age indeterminate DVTs. He recevievd 2 units PRBC for ABLA and Eliquis  was resumed as hgb stabilized.  Pt reports strength gradually improving.  He sometimes uses compression hose for LE swelling at home.  Prior to arrival patient was independent with use of home equipment at home. Currently he requires min assist for functional mobility, mod assist with RW with cues for sequencing and posture, and min assist for ADLs. Therapy evaluations completed due to patient decreased functional mobility was admitted for a comprehensive rehab program.     Hospital Course: Jyren Harvel was admitted to rehab 11/25/2023 for inpatient therapies to consist of PT, ST and OT at least three hours five days a week. Past admission physiatrist, therapy team and rehab RN have worked together to provide customized collaborative inpatient rehab.  On 11/29/2023 patient exhibited sudden onset of right facial droop, increased right-sided weakness, and aphasia.  Code stroke was activated by primary RN and the patient was transferred to CT scan with stroke team for evaluation.  Due to findings of thrombus in the left ICA terminus as well as a near occlusive thrombus of the basilar it on CT angiogram the patient underwent mechanical thrombectomy and was discharged from inpatient rehab admitted back to acute care services.    Medications at discharge:   amLODipine   5 mg Oral Daily   apixaban   5 mg Oral BID   atorvastatin   80 mg Oral Daily   Chlorhexidine  Gluconate Cloth  6 each Topical Q12H   feeding supplement  237 mL Oral BID BM   multivitamin with minerals  1 tablet Oral Daily     Disposition: Discharge disposition: 02-Transferred to [facility]           Signed: Shilynn Hoch L Emon Lance 11/29/2023, 5:53 PM

## 2023-11-29 NOTE — Transfer of Care (Signed)
 Immediate Anesthesia Transfer of Care Note  Patient: Allen Macdonald  Procedure(s) Performed: RADIOLOGY WITH ANESTHESIA  Patient Location: PACU  Anesthesia Type:General  Level of Consciousness: awake and alert   Airway & Oxygen Therapy: Patient Spontanous Breathing and Patient connected to face mask oxygen  Post-op Assessment: Report given to RN, Post -op Vital signs reviewed and stable, and Patient moving all extremities  Post vital signs: Reviewed and stable  Last Vitals:  Vitals Value Taken Time  BP 117/64 11/29/23 18:55  Temp 36.6 C 11/29/23 18:55  Pulse    Resp 20 11/29/23 18:55  SpO2 100 % 11/29/23 18:55    Last Pain:  Vitals:   11/29/23 1855  TempSrc:   PainSc: 0-No pain         Complications: No notable events documented.

## 2023-11-29 NOTE — Progress Notes (Signed)
Pts belonging retrieved from 69M.  Pt suitcase now at bedside.

## 2023-11-29 NOTE — Progress Notes (Signed)
                                                         PROGRESS NOTE     Patient experienced acute onset of changes in speech along with right facial droop and right sided weakness around 1537.  Symptoms witnessed by Glade RN at the time of symptom onset, Code stroke activated. Vitals stable. Stroke team arrived and patient was transported to radiology.      A FACE TO FACE EVALUATION WAS PERFORMED  Any Mcneice L Shabana Armentrout 11/29/2023, 4:59 PM

## 2023-11-29 NOTE — Brief Op Note (Signed)
  NEUROSURGERY BRIEF OPERATIVE  NOTE   PREOP DX: Left carotid T and basilar tip emboli  POSTOP DX: Same  PROCEDURE: Embolectomy left ICA T and basilar tip  SURGEON: Nancyann LULLA Burns   ANESTHESIA: GETA  EBL: 50  SPECIMENS: None  COMPLICATIONS: None  CONDITION: Stable to recovery  FINDINGS (Full report in [facility]): 1. Left carotid T and basilar emboli; TIC3 post.  Left MCA dissection looks healed.   Nancyann LULLA Burns  @today @ 6:55 PM

## 2023-11-29 NOTE — Progress Notes (Signed)
 Occupational Therapy Session Note  Patient Details  Name: Allen Macdonald MRN: [id number] Date of Birth: [birth year]  Today's Date: 11/29/2023 OT Individual Time: 9269-9187 OT Individual Time Calculation (min): 42 min    Short Term Goals: Week 1:  OT Short Term Goal 1 (Week 1): Pt will complete UB dressing with MIN A OT Short Term Goal 2 (Week 1): Pt will complete toileting with MIN A using LRAD OT Short Term Goal 3 (Week 1): Pt will complete toilet transfers with CGA using LRAD OT Short Term Goal 4 (Week 1): Pt will complete LB dressing MIN A using AE and LRAD PRN   Skilled Therapeutic Interventions/Progress Updates:    Pt up in recliner chair, no pain reported. Expressive aphasia evident throughout session, communicating mostly through yes/no and simple questions. Pt stating brief was soiled and wanted to change clothes, stand pivot with MOD for power up and MIN/CGA for transfer portion with RW. Set up at sink level and MIN for oral hygiene, educated on adaptive techniques. Pt performing UB dress with MIN A and extended time to locate arm holes and sequence. LB dress with MAX A for brief and shorts, pt able to stand throughout session with MOD for power up and CGA for balance but needed assist in standing to don over hips. Note brief was not soiled but did change for hygiene. Stand pivot back to recliner MIN A, set up alarm on call bell in reach all needs met.   Therapy Documentation Precautions:  Precautions Precautions: Fall, Other (comment) Recall of Precautions/Restrictions: Intact Precaution/Restrictions Comments: bilat nephrostomy bags Restrictions Weight Bearing Restrictions Per Provider Order: No      Therapy/Group: Individual Therapy  Chiquita JAYSON Hopping 11/29/2023, 7:21 AM

## 2023-11-29 NOTE — Anesthesia Postprocedure Evaluation (Signed)
 Anesthesia Post Note  Patient: Allen Macdonald  Procedure(s) Performed: RADIOLOGY WITH ANESTHESIA     Patient location during evaluation: PACU Anesthesia Type: General Level of consciousness: awake and alert Pain management: pain level controlled Vital Signs Assessment: post-procedure vital signs reviewed and stable Respiratory status: spontaneous breathing, nonlabored ventilation, respiratory function stable and patient connected to nasal cannula oxygen Cardiovascular status: blood pressure returned to baseline and stable Postop Assessment: no apparent nausea or vomiting Anesthetic complications: no   No notable events documented.  Last Vitals:  Vitals:   11/29/23 1915 11/29/23 1930  BP: 113/72 127/62  Pulse: 79 81  Resp: 15 15  Temp:    SpO2: 97% 97%    Last Pain:  Vitals:   11/29/23 1930  TempSrc:   PainSc: 0-No pain                 Franky JONETTA Bald

## 2023-11-29 NOTE — Progress Notes (Signed)
 Speech Language Pathology Daily Session Note  Patient Details  Name: Allen Macdonald MRN: [id number] Date of Birth: 1950/12/18  Today's Date: 11/29/2023 SLP Individual Time: 8596-8554 SLP Individual Time Calculation (min): 42 min  Short Term Goals: Week 1: SLP Short Term Goal 1 (Week 1): Pt will complete mildly complex naming tasks w/ minA SLP Short Term Goal 2 (Week 1): Pt will follow mildly complex directions w/ minA SLP Short Term Goal 3 (Week 1): Pt will answer mildly complex Y/N questions w/ minA SLP Short Term Goal 4 (Week 1): Pt will complete written expression tasks w/ minA  Skilled Therapeutic Interventions: Skilled therapy session focused on communication goals. SLP facilitated session by prompting patient to complete confrontational naming task. Patient named functional objects with 90% accuracy given min verbal A (specifically phonemic and/or semantic cues). Patient with occasional instances of semantic paraphasias, perseveration and tense oral musculature. Patient usually aware and able to correct errors. SLP continued to target expressive language through responsive naming tasks. Patient with 50% accuracy independently and 75% with minA. Patient benefited from verbal encouragement and short rest breaks to reduce frustration. Patient left in chair with alarm set and call bell in reach. Continue POC  Pain Denies   Therapy/Group: Individual Therapy  Gohan Collister M.A., CCC-SLP 11/29/2023, 7:49 AM

## 2023-11-29 NOTE — Progress Notes (Signed)
 Speech Language Pathology Daily Session Note  Patient Details  Name: Allen Macdonald MRN: [id number] Date of Birth: Dec 29, 1950  Today's Date: 11/29/2023 SLP Individual Time: 1002-1100 SLP Individual Time Calculation (min): 58 min  Short Term Goals: Week 1: SLP Short Term Goal 1 (Week 1): Pt will complete mildly complex naming tasks w/ minA SLP Short Term Goal 2 (Week 1): Pt will follow mildly complex directions w/ minA SLP Short Term Goal 3 (Week 1): Pt will answer mildly complex Y/N questions w/ minA SLP Short Term Goal 4 (Week 1): Pt will complete written expression tasks w/ minA  Skilled Therapeutic Interventions:  Patient was seen in am to address expressive and receptive language. Pt was alert and seated upright in recliner upon SLP arrival. He denied pain and was agreeable for session. Pt answered basic yes/ no questions presented verbally with 86% acc. Task intensity increased to mild complexity with pt achieving 69% acc indep improving to 85% acc given stimulus repetition and encouragement for phrase repetition. He followed mildly complex directions with 89% acc indep. Pt challenged in mildly complex naming. He named items in the room with 82% acc indep improving to 87% acc with min A. Pt challenged later in session to name body parts. Pt with greater difficulty with this task as naming accuracy decreased to 60%, suspect due to fatigue. Pt also challenged in written expression with pt able to write his name and age indep. He warranted mod cues for additional expression (I.e. stroke, name of city, and current location). Pt left upright in recliner at conclusion of session with call button within reach and chair alarm active. SLP to continue POC.   Pain Pain Assessment Pain Scale: 0-10 Pain Score: 0-No pain  Therapy/Group: Individual Therapy  Joane GORMAN Fuss 11/29/2023, 10:55 AM

## 2023-11-29 NOTE — Progress Notes (Signed)
 Much better after thrombectomy of left carotid T and basilar. Alert Visual fields full and normal gaze.  Follow commands reliably. Expressive aphasia but names some objects.   Good strength. Left femoral puncture looks good.  I have ordered CT for tomorrow AM.  I would favor early anticoagulation as he is clearly hypercoagulable and at high risk for another embolus.

## 2023-11-29 NOTE — Code Documentation (Signed)
 Inpt code stroke activated for Allen Macdonald, inpt 4 M 11. Pt was LKW 1528 and is now complaining of rt sided weakness and aphasia. NIHSS 10, please see documentation for details. Stroke team to pts bedside, then escorted pt to CT. The following imaging was obtained: CT, CTA. Per Dr. Lindzen, CT is negative for acute hemorrhage, and CTA is positive for LVO. Pt is ineligible for TNK as he is on Eliquis . Bedside handoff with IR RN Augustin RN complete.

## 2023-11-29 NOTE — Progress Notes (Signed)
 Dr. Lester visited pt at bedside. Per MD SBP okay if less than 120, as long as >90 and groin site is stable.

## 2023-11-29 NOTE — Sedation Documentation (Signed)
 Care per anesthesia at this time for IR Stroke response. Osborn Daring, CRNA at bedside at [phone]. See CRNA documentation.

## 2023-11-29 NOTE — H&P (Signed)
 NEUROLOGY H&P NOTE   Date of service: November 29, 2023 Patient Name: Allen Macdonald MRN:  [id number] DOB:  22-Jun-1950 Chief Complaint: Acute onset aphasia, right-sided weakness and right-sided facial droop  History of Present Illness  Allen Macdonald is a 73 y.o. male with a PMHx of 2 recent left MCA strokes status post mechanical thrombectomies of both, hypertension, DVT, PE on Eliquis  and bladder cancer status post bilateral nephrostomies who was in rehabilitation after his previous strokes and developed today sudden onset of right facial droop, increased right-sided weakness and expressive and receptive aphasia.  Initial head CT revealed no acute abnormalities and CT angiogram demonstrated thrombus in the left ICA terminus with occlusion of left M1 origin as well as near occlusive thrombus at the basilar tip.  Discussion was had with patient's wife and Dr. Lester, and decision was made to proceed with mechanical thrombectomy after obtaining informed consent from his wife over the telephone.  Last known well: 1528 Modified rankin score: 3 IV Thrombolysis: No, recent stroke and on Eliquis  Thrombectomy: Yes  NIHSS components Score: Comment  1a Level of Conscious 0[x]  1[]  2[]  3[]      1b LOC Questions 0[]  1[]  2[x]       1c LOC Commands 0[]  1[]  2[x]       2 Best Gaze 0[x]  1[]  2[]       3 Visual 0[x]  1[]  2[]  3[]      4 Facial Palsy 0[]  1[x]  2[]  3[]      5a Motor Arm - left 0[x]  1[]  2[]  3[]  4[]  UN[]    5b Motor Arm - Right 0[]  1[x]  2[]  3[]  4[]  UN[]    6a Motor Leg - Left 0[x]  1[]  2[]  3[]  4[]  UN[]    6b Motor Leg - Right 0[]  1[x]  2[]  3[]  4[]  UN[]    7 Limb Ataxia 0[x]  1[]  2[]  UN[]      8 Sensory 0[x]  1[]  2[]  UN[]      9 Best Language 0[]  1[]  2[x]  3[]      10 Dysarthria 0[]  1[x]  2[]  UN[]      11 Extinct. and Inattention 0[x]  1[]  2[]       TOTAL:  10       ROS   Unable to perform due to aphasia  Past History   Past Medical History:  Diagnosis Date   Abnormal stress test minimal ischemia involving  apex 05/01/2019   Bladder cancer (HCC)    Dilated cardiomyopathy (HCC)    Essential hypertension 12/18/2015   History of DVT (deep vein thrombosis) 04/22/2017   History of pulmonary embolism 12/18/2015   Laryngeal cancer (HCC) squamous cell 02/25/2020   PC (prostate cancer) (HCC) 08/28/2019   Past Surgical History:  Procedure Laterality Date   CATARACT EXTRACTION Bilateral    INSERTION PROSTATE RADIATION SEED  2003   IR CT HEAD LTD  11/20/2023   IR PERCUTANEOUS ART THROMBECTOMY/INFUSION INTRACRANIAL INC DIAG ANGIO  11/20/2023   IR PERCUTANEOUS ART THROMBECTOMY/INFUSION INTRACRANIAL INC DIAG ANGIO  11/21/2023   IR US  GUIDE VASC ACCESS RIGHT  11/20/2023   IR US  GUIDE VASC ACCESS RIGHT  11/21/2023   PROSTATE SURGERY     RADIOLOGY WITH ANESTHESIA N/A 11/20/2023   Procedure: RADIOLOGY WITH ANESTHESIA;  Surgeon: Radiologist, Medication, MD;  Location: [facility] OR;  Service: Radiology;  Laterality: N/A;   RADIOLOGY WITH ANESTHESIA N/A 11/21/2023   Procedure: RADIOLOGY WITH ANESTHESIA;  Surgeon: Radiologist, Medication, MD;  Location: [facility] OR;  Service: Radiology;  Laterality: N/A;   TONSILLECTOMY     Venacava Filter     WISDOM TOOTH EXTRACTION  Family History  Problem Relation Age of Onset   Heart failure Mother    Pleurisy Mother    Other Father        UNK MEDICAL HX   Social History   Socioeconomic History   Marital status: Married    Spouse name: Allen Macdonald   Number of children: 0   Years of education: 12 + 2   Highest education level: Not on file  Occupational History   Occupation: RETIRED CONSTRUCTION  Tobacco Use   Smoking status: Never   Smokeless tobacco: Never  Vaping Use   Vaping status: Never Used  Substance and Sexual Activity   Alcohol use: Yes    Alcohol/week: 14.0 standard drinks of alcohol    Types: 10 Cans of beer, 1 Shots of liquor, 3 Glasses of wine per week   Drug use: No   Sexual activity: Not Currently  Other Topics Concern   Not on file  Social History Narrative    Not on file   Social Drivers of Health   Financial Resource Strain: Not on file  Food Insecurity: No Food Insecurity (11/20/2023)   Hunger Vital Sign    Worried About Running Out of Food in the Last Year: Never true    Ran Out of Food in the Last Year: Never true  Transportation Needs: No Transportation Needs (11/20/2023)   PRAPARE - Administrator, Civil Service (Medical): No    Lack of Transportation (Non-Medical): No  Physical Activity: Not on file  Stress: Not on file  Social Connections: Unknown (11/24/2023)   Social Connection and Isolation Panel    Frequency of Communication with Friends and Family: Twice a week    Frequency of Social Gatherings with Friends and Family: Twice a week    Attends Religious Services: 1 to 4 times per year    Active Member of [employer] or Organizations: No    Attends Banker Meetings: Never    Marital Status: Not on file   Allergies  Allergen Reactions   Bee Venom Anaphylaxis    Medications  No current facility-administered medications for this encounter.  Current Outpatient Medications:    amLODipine  (NORVASC ) 5 MG tablet, Take 5 mg by mouth daily., Disp: , Rfl:    apixaban  (ELIQUIS ) 5 MG TABS tablet, Take 1 tablet (5 mg total) by mouth 2 (two) times daily., Disp: , Rfl:    atorvastatin  (LIPITOR ) 80 MG tablet, Take 1 tablet (80 mg total) by mouth daily., Disp: , Rfl:    feeding supplement (ENSURE PLUS HIGH PROTEIN) LIQD, Take 237 mLs by mouth 2 (two) times daily between meals., Disp: , Rfl:    Multiple Vitamin (MULTIVITAMIN WITH MINERALS) TABS tablet, Take 1 tablet by mouth daily., Disp: , Rfl:    thiamine  (VITAMIN B-1) 100 MG tablet, Take 1 tablet (100 mg total) by mouth daily., Disp: , Rfl:   Facility-Administered Medications Ordered in Other Encounters:    acetaminophen  (TYLENOL ) tablet 325-650 mg, 325-650 mg, Oral, Q4H PRN, Leak, Brandi L, NP   alum & mag hydroxide-simeth (MAALOX/MYLANTA) 200-200-20 MG/5ML suspension 30  mL, 30 mL, Oral, Q4H PRN, Leak, Brandi L, NP   amLODipine  (NORVASC ) tablet 5 mg, 5 mg, Oral, Daily, Leak, Brandi L, NP, 5 mg at 11/29/23 0847   apixaban  (ELIQUIS ) tablet 5 mg, 5 mg, Oral, BID, Leak, Brandi L, NP, 5 mg at 11/29/23 0847   atorvastatin  (LIPITOR ) tablet 80 mg, 80 mg, Oral, Daily, Leak, Brandi L, NP, 80 mg at 11/29/23  9152   bisacodyl  (DULCOLAX) suppository 10 mg, 10 mg, Rectal, Daily PRN, Leak, Brandi L, NP   Chlorhexidine  Gluconate Cloth 2 % PADS 6 each, 6 each, Topical, Q12H, Kirsteins, Prentice BRAVO, MD, 6 each at 11/29/23 1621   diphenhydrAMINE  (BENADRYL ) capsule 25 mg, 25 mg, Oral, Q6H PRN, Leak, Brandi L, NP   feeding supplement (ENSURE PLUS HIGH PROTEIN) liquid 237 mL, 237 mL, Oral, BID BM, Leak, Brandi L, NP, 237 mL at 11/29/23 1041   fentaNYL  (SUBLIMAZE ) injection, , Intravenous, Anesthesia Intra-op, Delores Dus, CRNA, 100 mcg at 11/29/23 1752   guaiFENesin -dextromethorphan (ROBITUSSIN DM) 100-10 MG/5ML syrup 5-10 mL, 5-10 mL, Oral, Q6H PRN, Leak, Brandi L, NP   lidocaine  (cardiac) 100 mg/10mL (XYLOCAINE ) injection 2%, , Tracheal Tube, Anesthesia Intra-op, Delores Dus, CRNA, 60 mg at 11/29/23 1753   multivitamin with minerals tablet 1 tablet, 1 tablet, Oral, Daily, Leak, Brandi L, NP, 1 tablet at 11/29/23 0847   Oral care mouth rinse, 15 mL, Mouth Rinse, PRN, Love, Pamela S, PA-C   prochlorperazine  (COMPAZINE ) tablet 5-10 mg, 5-10 mg, Oral, Q6H PRN **OR** prochlorperazine  (COMPAZINE ) suppository 12.5 mg, 12.5 mg, Rectal, Q6H PRN **OR** prochlorperazine  (COMPAZINE ) injection 5-10 mg, 5-10 mg, Intravenous, Q6H PRN, Leak, Brandi L, NP   propofol  (DIPRIVAN ) 10 mg/mL bolus/IV push, , Intravenous, Anesthesia Intra-op, Delores Dus, CRNA, 100 mg at 11/29/23 1754   senna-docusate (Senokot-S) tablet 1 tablet, 1 tablet, Oral, QHS PRN, Leak, Brandi L, NP   sodium chloride  (OCEAN) 0.65 % nasal spray 1 spray, 1 spray, Each Nare, PRN, Leak, Brandi L, NP   sodium chloride  flush (NS) 0.9 %  injection 10-40 mL, 10-40 mL, Intracatheter, PRN, Kirsteins, Prentice BRAVO, MD   sodium phosphate (FLEET) enema 1 enema, 1 enema, Rectal, Once PRN, Leak, Brandi L, NP   succinylcholine  (ANECTINE ) syringe, , Intravenous, Anesthesia Intra-op, Delores Dus, CRNA, 100 mg at 11/29/23 1755   traZODone  (DESYREL ) tablet 25-50 mg, 25-50 mg, Oral, QHS PRN, Leak, Brandi L, NP, 50 mg at 11/28/23 2023   Vitals   There were no vitals filed for this visit.   There is no height or weight on file to calculate BMI.  Physical Exam   Constitutional: Appears well-developed and well-nourished.  Psych: Affect appropriate to situation.  Eyes: No scleral injection.  HENT: No OP obstruction.  Respiratory: Effort normal, non-labored breathing.  Skin: WDI.   Neurologic Examination    Mental Status: Patient is alert and able to intermittently follow simple commands Speech/Language: Speech is with moderate dysarthria, severe expressive aphasia and moderate receptive aphasia Cranial Nerves:  II: PERRL.  Blinks to threat bilaterally III, IV, VI: EOMI. Eyelids elevate symmetrically.  VII: Right facial droop VIII: hearing intact to voice. IX, X: Voice is dysarthric XII: tongue is midline without fasciculations. Motor: Able to move all 4 extremities with antigravity strength, but drift present in right upper and lower extremities Tone is normal and bulk is normal Sensation- Intact to light touch bilaterally.  Coordination: Unable to perform due to comprehension deficit Gait- Deferred     Labs   CBC:  Recent Labs  Lab 11/25/23 0415 11/28/23 0339  WBC 6.3 8.0  NEUTROABS  --  5.6  HGB 9.9* 9.5*  HCT 30.0* 29.8*  MCV 89.3 93.4  PLT 143* 113*   Basic Metabolic Panel:  Lab Results  Component Value Date   NA 141 11/28/2023   K 4.0 11/29/2023   CO2 22 11/28/2023   GLUCOSE 108 (H) 11/28/2023   BUN 30 (H) 11/28/2023  CREATININE 1.36 (H) 11/28/2023   CALCIUM  8.4 (L) 11/28/2023   GFRNONAA 54 (L)  11/28/2023   GFRAA 91 07/05/2019   Lipid Panel:  Lab Results  Component Value Date   LDLCALC 112 (H) 11/21/2023   HgbA1c:  Lab Results  Component Value Date   HGBA1C 5.7 (H) 11/20/2023   Urine Drug Screen: No results found for: LABOPIA, COCAINSCRNUR, LABBENZ, AMPHETMU, THCU, LABBARB  Alcohol Level     Component Value Date/Time   [facility] <15 11/20/2023 0959   INR  Lab Results  Component Value Date   INR 1.2 11/20/2023   APTT  Lab Results  Component Value Date   APTT 32 11/20/2023     CT Head without contrast (Personally reviewed): Evolving subacute left MCA infarcts, no definite acute abnormality  CT angio Head and Neck with contrast (Personally reviewed): Thrombus in the left ICA terminus with occlusion of the left M1 origin, new severe stenosis or near occlusive thrombus of the basilar tip involving the origins of both PCAs, increased, severe stenosis of distal V4 segment of nondominant right vertebral artery  MRI Brain (Personally reviewed): Pending  Assessment  Joeanthony Wieand is a 73 y.o. male with a PMHx of 2 recent left MCA strokes status post mechanical thrombectomies of both, hypertension, DVT, PE on Eliquis  and bladder cancer status post bilateral nephrostomies who developed acute onset of worsening right facial droop, worsening right-sided weakness, aphasia and worsening dysarthria today while in rehabilitation for his strokes.   - Exam with expressive > receptive aphasia and right sided weakness. NIHSS 10.  - CT head: Evolving subacute left MCA infarcts. No definite acute infarct or intracranial hemorrhage. - CT angiogram of head and neck reveals thrombus in the left ICA terminus as well as near occlusive thrombus of the basilar tip.   - Case was discussed with Dr. Lester and with patient's wife regarding possible thrombectomy, including risks of bleeding during or after mechanical thrombectomy, as well as patient's hypercoagulable state and likely  recurrence of thrombus at some time later during this admission or after discharge, even if mechanical thrombectomy is performed. Risks versus benefits of conservative management alone were also discussed. The patient's wife stated that she would like to proceed with mechanical thrombectomy, and patient was taken to interventional radiology. The patient is not able to provide his own consent due to his acute aphasia.  - Of note, during patient's previous admission, he underwent mechanical thrombectomy for left carotid and M1 occlusions on 8/3.  He had repeat symptoms of left gaze, slurred speech and right arm weakness on 8/4 and was found to have left M1 occlusion again, for which he underwent another mechanical thrombectomy.  It is suspected that the reason for the recurrent thrombi, despite oral anticoagulation, is a hypercoagulable state in the setting of advanced bladder cancer.  Primary Diagnosis:  Cerebral infarction due to thrombosis of left middle cerebral artery.   Recommendations  - Admit to ICU post mechanical thrombectomy - Blood pressure parameters to be determined by interventional radiologist after procedure - MRI brain wo contrast - TTE recently performed, no need to repeat - No antiplatelet medications or anticoagulations for 24 hours after mechanical thrombectomy - Antiplt/anticoag resumption to be determined based on imaging 24 hours after VIR, to rule out hemorrhagic conversion. If imaging is negative for hemorrhage, will likely resume Eliquis  or switch to warfarin - NIHSS and vital signs every 15 minutes x 2 hours, every 30 minutes x 6 hours and hourly thereafter - STAT head  CT for any change in neuro exam - Tele - PT/OT/SLP - Stroke education - Amb referral to neurology upon discharge   ______________________________________________________________________ Patient seen by NP with MD. Signed, Cortney E Everitt Clint Kill, NP [facility]  I have seen and examined the  patient. I have formulated the assessment and recommendations. 73 y.o. male with a PMHx of 2 recent left MCA strokes status post mechanical thrombectomies of both, hypertension, DVT, PE on Eliquis  and bladder cancer status post bilateral nephrostomies who developed acute onset of worsening right facial droop, worsening right-sided weakness, aphasia and worsening dysarthria today while in rehabilitation for his strokes. Exam with expressive > receptive aphasia and right sided weakness; NIHSS 10. CT angiogram of head and neck reveals thrombus in the left ICA terminus as well as near occlusive thrombus of the basilar tip. The patient's wife has provided informed consent for mechanical thrombectomy. Post-IR recommendations as above.  Electronically signed: Dr. Sharry Beining

## 2023-11-29 NOTE — Progress Notes (Signed)
 Physical Therapy Session Note  Patient Details  Name: Allen Macdonald MRN: [id number] Date of Birth: 14-Aug-1950  Today's Date: 11/29/2023 PT Individual Time: 8547-8467 PT Individual Time Calculation (min): 40 min   Short Term Goals: Week 1:  PT Short Term Goal 1 (Week 1): Pt will complete transfers with CGA consistently PT Short Term Goal 2 (Week 1): Pt will complete up/down 4 steps with CGA and BHRs PT Short Term Goal 3 (Week 1): Pt will ambulate 150' with RW CGA  Skilled Therapeutic Interventions/Progress Updates:    Pt presents in room in recliner, agreeable to PT. Pt requests for nephrostomy bags to be emptied, charted.  Session focused on gait training with various assistive devices and for stair negotiation as well as therapeutic activity for energy conservation and management as well as transfer training. Pt completes transfers with min assist and mod cues for BUE hand placement as pt consistently attempts to pull to stand with RW/rollator, requires cues to push up from sitting surface. Pt completes gait 215' and 235' with RW to/from room to main gym with CGA, mod cues for increasing proximity to RW and for upright posture as pt demonstrating forward flexed trunk most significantly with fatigue. Pt completes gait training with rollator 42' with CGA, cues for brake management with transfers and to sit on rollator with pt demonstrating understanding with repetition, pt demonstrating improved fluidity of gait and minimal postural sway however continues to demonstrate forward flexed trunk. Pt requires extended seated rest breaks following all mobility tasks with pt demonstrating SOB that subsides with rest. Pt completes up/down 4 6 steps with min assist, ascends leading with RLE, descends leading with LLE, pt with toe catch with advancing RLE ascending however no LOB. Pt returns to room and remains seated in recliner with all needs within reach, cal light in place and chair alarm donned and  activated at end of session.   Therapy Documentation Precautions:  Precautions Precautions: Fall, Other (comment) Recall of Precautions/Restrictions: Intact Precaution/Restrictions Comments: bilat nephrostomy bags Restrictions Weight Bearing Restrictions Per Provider Order: No  Therapy/Group: Individual Therapy  Reche Ohara PT, DPT 11/29/2023, 3:40 PM

## 2023-11-29 NOTE — Anesthesia Procedure Notes (Signed)
 Procedure Name: Intubation Date/Time: 11/29/2023 5:56 PM  Performed by: Delores Dus, CRNAPre-anesthesia Checklist: Patient identified, Emergency Drugs available, Suction available and Patient being monitored Patient Re-evaluated:Patient Re-evaluated prior to induction Oxygen Delivery Method: Circle system utilized Preoxygenation: Pre-oxygenation with 100% oxygen Induction Type: IV induction Ventilation: Mask ventilation without difficulty Laryngoscope Size: Miller, 2, Mac and 3 Grade View: Grade II Tube type: Oral Tube size: 7.0 mm Number of attempts: 2 Airway Equipment and Method: Stylet and Oral airway Placement Confirmation: ETT inserted through vocal cords under direct vision, positive ETCO2 and breath sounds checked- equal and bilateral Secured at: 22 cm Tube secured with: Tape Dental Injury: Teeth and Oropharynx as per pre-operative assessment

## 2023-11-29 NOTE — Progress Notes (Signed)
 PROGRESS NOTE   Subjective/Complaints:  No hematuria  K+ corrected, meds reviewed no diuretics   ROS: as per HPI. Denies CP, SOB, abd pain, N/V/D/C, or any other complaints at this time.    Objective:   No results found. Recent Labs    11/28/23 0339  WBC 8.0  HGB 9.5*  HCT 29.8*  PLT 113*   Recent Labs    11/28/23 0339 11/29/23 0411  NA 141  --   K 3.3* 4.0  CL 109  --   CO2 22  --   GLUCOSE 108*  --   BUN 30*  --   CREATININE 1.36*  --   CALCIUM  8.4*  --     Urinalysis    Component Value Date/Time   COLORURINE YELLOW 11/20/2023 2204   APPEARANCEUR HAZY (A) 11/20/2023 2204   LABSPEC 1.026 11/20/2023 2204   PHURINE 5.0 11/20/2023 2204   GLUCOSEU NEGATIVE 11/20/2023 2204   HGBUR SMALL (A) 11/20/2023 2204   BILIRUBINUR NEGATIVE 11/20/2023 2204   KETONESUR NEGATIVE 11/20/2023 2204   PROTEINUR 100 (A) 11/20/2023 2204   NITRITE POSITIVE (A) 11/20/2023 2204   LEUKOCYTESUR LARGE (A) 11/20/2023 2204      Intake/Output Summary (Last 24 hours) at 11/29/2023 0811 Last data filed at 11/29/2023 0735 Gross per 24 hour  Intake 477 ml  Output 375 ml  Net 102 ml        Physical Exam: Vital Signs Blood pressure 103/79, pulse 85, temperature 98.2 F (36.8 C), resp. rate 18, height 5' 10 (1.778 m), weight 103.3 kg, SpO2 99%.    General: No acute distress Mood and affect are appropriate Heart: Regular rate and rhythm no rubs murmurs or extra sounds Lungs: Clear to auscultation, breathing unlabored, no rales or wheezes Abdomen: Positive bowel sounds, soft nontender to palpation, nondistended Extremities: No clubbing, cyanosis, or edema Skin: No evidence of breakdown, no evidence of rash Neurologic: Cranial nerves II through XII intact, motor strength is 5/5 in bilateral deltoid, bicep, tricep, grip, hip flexor, knee extensors, ankle dorsiflexor and plantar flexor Sensory exam normal sensation to light touch  and proprioception in bilateral upper and lower extremities Cerebellar exam normal finger to nose to finger as well as heel to shin in bilateral upper and lower extremities Musculoskeletal: Full range of motion in all 4 extremities. No joint swelling   Speech- good comprehension,reduced fluency    Mental Status: AAOx person, place, not date. Able to answer situation with choices. Delayed responses  Speech/Languate: + Aphasia. Word finding difficulties. Mild dysarthria. Naming and repetition intact, follows simple commands   Finger to thumb opposition is slow on right side but intact   MOTOR: RUE: 3/5 Deltoid, 4-/5 Biceps, 4-/5 Triceps,4-/5 Grip LUE: 5/5 Deltoid, 5/5 Biceps, 5/5 Triceps, 5/5 Grip RLE: HF 4/5, KE 4/5, ADF 4/5, APF 4/5 LLE: HF 5/5, KE 5/5, ADF 5/5, APF 5/5   SENSORY: Normal to touch all 4 extremities    Increased tone R biceps  Assessment/Plan: 1. Functional deficits which require 3+ hours per day of interdisciplinary therapy in a comprehensive inpatient rehab setting. Physiatrist is providing close team supervision and 24 hour management of active medical problems listed below. Physiatrist and  rehab team continue to assess barriers to discharge/monitor patient progress toward functional and medical goals  Care Tool:  Bathing    Body parts bathed by patient: Right arm, Left arm, Chest, Abdomen, Right upper leg, Left upper leg, Front perineal area, Face   Body parts bathed by helper: Buttocks, Front perineal area, Left lower leg, Right lower leg     Bathing assist Assist Level: Moderate Assistance - Patient 50 - 74%     Upper Body Dressing/Undressing Upper body dressing   What is the patient wearing?: Pull over shirt    Upper body assist Assist Level: Moderate Assistance - Patient 50 - 74%    Lower Body Dressing/Undressing Lower body dressing      What is the patient wearing?: Pants     Lower body assist Assist for lower body dressing: Moderate  Assistance - Patient 50 - 74%     Toileting Toileting    Toileting assist Assist for toileting: Maximal Assistance - Patient 25 - 49%     Transfers Chair/bed transfer  Transfers assist     Chair/bed transfer assist level: Minimal Assistance - Patient > 75%     Locomotion Ambulation   Ambulation assist      Assist level: Minimal Assistance - Patient > 75% Assistive device: Walker-rolling Max distance: 100'   Walk 10 feet activity   Assist     Assist level: Minimal Assistance - Patient > 75% Assistive device: Walker-rolling   Walk 50 feet activity   Assist    Assist level: Minimal Assistance - Patient > 75% Assistive device: Walker-rolling    Walk 150 feet activity   Assist    Assist level: Minimal Assistance - Patient > 75% Assistive device: Walker-rolling    Walk 10 feet on uneven surface  activity   Assist     Assist level: Minimal Assistance - Patient > 75% Assistive device: Walker-rolling   Wheelchair     Assist Is the patient using a wheelchair?: Yes Type of Wheelchair: Manual    Wheelchair assist level: Dependent - Patient 0%      Wheelchair 50 feet with 2 turns activity    Assist        Assist Level: Dependent - Patient 0%   Wheelchair 150 feet activity     Assist      Assist Level: Dependent - Patient 0%   Blood pressure 103/79, pulse 85, temperature 98.2 F (36.8 C), resp. rate 18, height 5' 10 (1.778 m), weight 103.3 kg, SpO2 99%.  Medical Problem List and Plan: 1. Functional deficits secondary to left MCA scattered infarcts with left ICA and left MCA occlusion s/p IR with TICI2c likely due to hypercoagulable state in setting of cancer             -patient may shower, cover incision/nephrostomy tubes             -ELOS/Goals: 12-16 days, PT/OT sup to min A, SLP supervision             -Continue CIR 2.  Antithrombotics: -DVT/anticoagulation:  Mechanical: Sequential compression devices, below knee  Bilateral lower extremities Pharmaceutical: Eliquis  5mg  BID             -antiplatelet therapy: N/A 3. Pain Management: Tylenol  prn  4. Mood/Behavior/Sleep: LCSW to follow for evaluation and support when available.              -antipsychotic agents: N/A 5. Neuropsych/cognition: This patient may be capable of making decisions on his own behalf.  6. Skin/Wound Care: routine pressure relief measures   7. Fluids/Electrolytes/Nutrition: Monitor I/O  - Per speech past swallow study continue regular diet w/ Ensure and multivitamin supplements-- SLP ordered 8/9  8.  Bladder Cancer s/p nephrostomy tubes: stage IV, metastatic to the bone by MRI of the spine on 11/08/2023.  -Hx of prostate CA, on androgen therapy- leupron q6mos-- follows with [facility] Heme/Onc -UTI: Ceftriaxone  1g daily ends 8/10-- done  9. AKI: Monitor BMP w/GFR  Cr 1.22--1.42--1.52>> 1.40 on 8/9. Monitor fluid intake  10.  Anemia: Hemoglobin stable post PRBC 2 units Hgb 9.9-- stable on admission  11. HTN: hx of orthostasis on 8/4 resolved after IV fluids--home amlodipine  5 mg daily resumed--Carvdilol 12.5 mg not resumed - resume as needed. Avoid hypotension  -11/26/23 BPs mostly ok, monitor trend-- improving 8/10, monitor  Vitals:   11/25/23 1934 11/26/23 0445 11/26/23 1439 11/26/23 1945  BP: 131/67 (!) 148/79 135/78 (!) 144/84   11/27/23 0552 11/27/23 0900 11/27/23 1313 11/27/23 2100  BP: 134/80 126/77 126/85 136/85   11/28/23 0420 11/28/23 1509 11/28/23 1934 11/29/23 0650  BP: (!) 144/74 138/78 128/77 103/79     12. DVT: hx of multiple BL DVT and PE had IVC filter placed 2014 --resumed Eliquis   -US  LE positive for age indeterminate DVTs: RT - PopV and PTV LT - CFV, PFV, FV, PopV, Gastrocs  Probable hypercoagulable state r/t carcinoma  13. HLD: LDL 112, goal < 70, atorvastatin  80mg  daily  14. Obesity: Educate on diet and weight loss to promote overall health and mobility.  Body mass index is 33.06 kg/m.  15. Hx of laryngeal  cancer s/p radiation therapy, voice sounds mildly hoarse    LOS: 4 days A FACE TO FACE EVALUATION WAS PERFORMED  Allen Macdonald 11/29/2023, 8:11 AM

## 2023-11-30 ENCOUNTER — Inpatient Hospital Stay (HOSPITAL_COMMUNITY)

## 2023-11-30 ENCOUNTER — Encounter (HOSPITAL_COMMUNITY): Payer: Self-pay | Admitting: Radiology

## 2023-11-30 DIAGNOSIS — I63543 Cerebral infarction due to unspecified occlusion or stenosis of bilateral cerebellar arteries: Secondary | ICD-10-CM | POA: Diagnosis not present

## 2023-11-30 DIAGNOSIS — C7951 Secondary malignant neoplasm of bone: Secondary | ICD-10-CM

## 2023-11-30 DIAGNOSIS — R29705 NIHSS score 5: Secondary | ICD-10-CM | POA: Diagnosis not present

## 2023-11-30 DIAGNOSIS — I63412 Cerebral infarction due to embolism of left middle cerebral artery: Secondary | ICD-10-CM | POA: Diagnosis not present

## 2023-11-30 DIAGNOSIS — C679 Malignant neoplasm of bladder, unspecified: Secondary | ICD-10-CM

## 2023-11-30 DIAGNOSIS — D6869 Other thrombophilia: Secondary | ICD-10-CM

## 2023-11-30 DIAGNOSIS — E785 Hyperlipidemia, unspecified: Secondary | ICD-10-CM

## 2023-11-30 DIAGNOSIS — I69391 Dysphagia following cerebral infarction: Secondary | ICD-10-CM

## 2023-11-30 LAB — BASIC METABOLIC PANEL WITH GFR
Anion gap: 7 (ref 5–15)
BUN: 20 mg/dL (ref 8–23)
CO2: 23 mmol/L (ref 22–32)
Calcium: 8.3 mg/dL — ABNORMAL LOW (ref 8.9–10.3)
Chloride: 109 mmol/L (ref 98–111)
Creatinine, Ser: 1.16 mg/dL (ref 0.61–1.24)
GFR, Estimated: >60 mL/min (ref >60)
Glucose, Bld: 90 mg/dL (ref 70–99)
Potassium: 3.9 mmol/L (ref 3.5–5.1)
Sodium: 139 mmol/L (ref 135–145)

## 2023-11-30 LAB — CBC
HCT: 27.7 % — ABNORMAL LOW (ref 39.0–52.0)
Hemoglobin: 8.5 g/dL — ABNORMAL LOW (ref 13.0–17.0)
MCH: 29.1 pg (ref 26.0–34.0)
MCHC: 30.7 g/dL (ref 30.0–36.0)
MCV: 94.9 fL (ref 80.0–100.0)
Platelets: 100 K/uL — ABNORMAL LOW (ref 150–400)
RBC: 2.92 MIL/uL — ABNORMAL LOW (ref 4.22–5.81)
RDW: 18.6 % — ABNORMAL HIGH (ref 11.5–15.5)
WBC: 6.6 K/uL (ref 4.0–10.5)
nRBC: 0 % (ref 0.0–0.2)

## 2023-11-30 LAB — APTT: aPTT: 98 s — ABNORMAL HIGH (ref 24–36)

## 2023-11-30 LAB — PROTIME-INR
INR: 1.2 (ref 0.8–1.2)
Prothrombin Time: 16.1 s — ABNORMAL HIGH (ref 11.4–15.2)

## 2023-11-30 LAB — HEPARIN LEVEL (UNFRACTIONATED): Heparin Unfractionated: >1.1 [IU]/mL — ABNORMAL HIGH (ref 0.30–0.70)

## 2023-11-30 MED ORDER — WARFARIN - PHARMACIST DOSING INPATIENT: Freq: Every day | Status: DC

## 2023-11-30 MED ORDER — PANTOPRAZOLE SODIUM 40 MG PO TBEC
40.0000 mg | DELAYED_RELEASE_TABLET | Freq: Every day | ORAL | Status: DC
  Administered 2023-11-30: 40 mg via ORAL

## 2023-11-30 MED ORDER — ATORVASTATIN CALCIUM 80 MG PO TABS
80.0000 mg | ORAL_TABLET | Freq: Every day | ORAL | Status: DC
  Administered 2023-11-30 – 2023-12-03 (×5): 80 mg via ORAL
  Filled 2023-11-30 (×4): qty 1

## 2023-11-30 MED ORDER — HEPARIN (PORCINE) 25000 UT/250ML-% IV SOLN
900.0000 [IU]/h | INTRAVENOUS | Status: DC
  Administered 2023-11-30 (×2): 1100 [IU]/h via INTRAVENOUS
  Administered 2023-12-01: 800 [IU]/h via INTRAVENOUS
  Administered 2023-12-02: 900 [IU]/h via INTRAVENOUS
  Filled 2023-11-30 (×3): qty 250

## 2023-11-30 MED ORDER — WARFARIN SODIUM 7.5 MG PO TABS
7.5000 mg | ORAL_TABLET | Freq: Once | ORAL | Status: AC
  Administered 2023-11-30 (×2): 7.5 mg via ORAL
  Filled 2023-11-30: qty 1

## 2023-11-30 NOTE — Evaluation (Signed)
 Physical Therapy Evaluation Patient Details Name: Allen Macdonald MRN: [id number] DOB: Jul 05, 1950 Today's Date: 11/30/2023  History of Present Illness  73 y.o. male who developed sudden onset of right facial droop, increased right-sided weakness and aphasia on 8/12 at [facility].  CT revealed a thrombus in left ICA terminus with occlusion of left M1 and a near occlusive thrombus of basilar tip.  Patient was seen for mechanical thrombectomy. PMH: Pt in [facility] on 11/20/2023 with R weakness, L gaze and expressive aphasia. CTA with L ICA occlusion, pt underwent thrombectomy on 8/3. Pt with neuro change on 8/4, found to have L M1 occlusion and underwent subsequent thrombectomy and found to have L MCA dissection,  HTN, DVT/PE, bladder CA s/p nephrostomy..   Clinical Impression  Pt re-admitted from [facility]. Pt presenting with expressive aphasia, R sided weakness, impaired balance, R inattention, and orthostatic BP limiting ability to ambulate this date. Pt eager to mobilize and very motivated. Pt remains appropriate to return to inpatient rehab program > 3 hrs a day to continue aggressive therapy for maximal functional recovery prior to transitioning home with spouse. Acute PT to cont to follow.        If plan is discharge home, recommend the following: A little help with walking and/or transfers;A lot of help with bathing/dressing/bathroom;Assistance with cooking/housework;Direct supervision/assist for medications management;Direct supervision/assist for financial management;Assist for transportation;Help with stairs or ramp for entrance;Supervision due to cognitive status   Can travel by private vehicle        Equipment Recommendations Rolling walker (2 wheels);BSC/3in1  Recommendations for Other Services  Rehab consult    Functional Status Assessment Patient has had a recent decline in their functional status and demonstrates the ability to make significant improvements in function in a reasonable and predictable  amount of time.     Precautions / Restrictions Precautions Precautions: Fall;Other (comment) Recall of Precautions/Restrictions: Intact Precaution/Restrictions Comments: bilat nephrostomy bags Restrictions Weight Bearing Restrictions Per Provider Order: No      Mobility  Bed Mobility Overal bed mobility: Needs Assistance Bed Mobility: Supine to Sit Rolling: Min assist, +2 for physical assistance Sidelying to sit: Min assist, +2 for physical assistance Supine to sit: Min assist, Used rails, HOB elevated     General bed mobility comments: increased time,cues for sequencing. Assist to scoot hips forward    Transfers Overall transfer level: Needs assistance Equipment used: Rolling walker (2 wheels) Transfers: Sit to/from Stand Sit to Stand: Min assist   Step pivot transfers: Mod assist       General transfer comment: Assist to maintain balance and manage RW. Initial assist to place R hand but able to grip RW throughout transfer. Cues for sequencing and posture. No knee buckling noted. Pt with report of dizziness, noted orthostatic BP, 108/61 in supine to 82/61 in standing, returned to 116/60 once in chair    Ambulation/Gait Ambulation/Gait assistance: Contact guard assist   Assistive device: Rolling walker (2 wheels) Gait Pattern/deviations: Step-to pattern, Step-through pattern, Trunk flexed, Decreased stride length Gait velocity: reduced     General Gait Details: limited by dizziness and orthostatic BP  Stairs            Wheelchair Mobility     Tilt Bed    Modified Rankin (Stroke Patients Only) Modified Rankin (Stroke Patients Only) Pre-Morbid Rankin Score: No symptoms Modified Rankin: Moderately severe disability     Balance Overall balance assessment: Needs assistance Sitting-balance support: Feet supported, No upper extremity supported Sitting balance-Leahy Scale: Fair  Standing balance support: Bilateral upper extremity supported, During  functional activity, Reliant on assistive device for balance Standing balance-Leahy Scale: Poor Standing balance comment: with RW support                       Timed Up and Go Test TUG: Normal TUG Normal TUG (seconds): 59.1     Pertinent Vitals/Pain Pain Assessment Pain Assessment: No/denies pain    Home Living Family/patient expects to be discharged to:: Private residence Living Arrangements: Spouse/significant other Available Help at Discharge: Family;Available 24 hours/day Type of Home: House Home Access: Stairs to enter Entrance Stairs-Rails: Can reach both Entrance Stairs-Number of Steps: 3   Home Layout: One level Home Equipment: Cane - single point;Toilet riser;Rolling Walker (2 wheels);Rollator (4 wheels);Wheelchair - manual;Shower seat      Prior Function Prior Level of Function : Other (comment) (was at [facility])             Mobility Comments: was amb with RW in rehab ADLs Comments: working towards indep     Extremity/Trunk Assessment   Upper Extremity Assessment Upper Extremity Assessment: Defer to OT evaluation    Lower Extremity Assessment Lower Extremity Assessment: RLE deficits/detail;LLE deficits/detail RLE Deficits / Details: ROM WFL, grossly 4/5 but weaker compared to L    Cervical / Trunk Assessment Cervical / Trunk Assessment: Normal  Communication   Communication Communication: Impaired Factors Affecting Communication: Difficulty expressing self    Cognition Arousal: Alert Behavior During Therapy: Flat affect   PT - Cognitive impairments: Awareness, Attention, Problem solving, Sequencing                       PT - Cognition Comments: pt with noted expressive difficulty, hard time with word finding limiting assessment of cognition. Pt did follow majority of commands however no carry over of date, situation, or place despite max verbal cues, choice given, and repetitive re-orientation Following commands:  Impaired Following commands impaired: Follows multi-step commands inconsistently, Only follows one step commands consistently, Follows one step commands with increased time     Cueing Cueing Techniques: Verbal cues, Gestural cues, Tactile cues     General Comments General comments (skin integrity, edema, etc.): orthostatic BP, see transfer section    Exercises     Assessment/Plan    PT Assessment Patient needs continued PT services  PT Problem List Decreased strength;Decreased activity tolerance;Decreased balance;Decreased mobility;Decreased knowledge of use of DME;Decreased safety awareness;Decreased knowledge of precautions;Decreased cognition       PT Treatment Interventions DME instruction;Gait training;Stair training;Functional mobility training;Therapeutic activities;Therapeutic exercise;Balance training;Neuromuscular re-education;Cognitive remediation;Patient/family education    PT Goals (Current goals can be found in the Care Plan section)  Acute Rehab PT Goals Patient Stated Goal: to return to independence PT Goal Formulation: With patient Time For Goal Achievement: 12/14/23 Potential to Achieve Goals: Fair    Frequency Min 3X/week     Co-evaluation PT/OT/SLP Co-Evaluation/Treatment: Yes Reason for Co-Treatment: Complexity of the patient's impairments (multi-system involvement);For patient/therapist safety;To address functional/ADL transfers PT goals addressed during session: Mobility/safety with mobility;Balance;Strengthening/ROM         AM-PAC PT 6 Clicks Mobility  Outcome Measure Help needed turning from your back to your side while in a flat bed without using bedrails?: A Little Help needed moving from lying on your back to sitting on the side of a flat bed without using bedrails?: A Little Help needed moving to and from a bed to a chair (including a wheelchair)?: A Little  Help needed standing up from a chair using your arms (e.g., wheelchair or bedside  chair)?: A Little Help needed to walk in hospital room?: A Lot Help needed climbing 3-5 steps with a railing? : Total 6 Click Score: 15    End of Session Equipment Utilized During Treatment: Gait belt Activity Tolerance: Patient tolerated treatment well Patient left: in chair;with chair alarm set;with call bell/phone within reach Nurse Communication: Mobility status PT Visit Diagnosis: Hemiplegia and hemiparesis Hemiplegia - Right/Left: Right Hemiplegia - dominant/non-dominant: Dominant Hemiplegia - caused by: Cerebral infarction    Time: 1026-1100 PT Time Calculation (min) (ACUTE ONLY): 34 min   Charges:   PT Evaluation $PT Eval Moderate Complexity: 1 Mod   PT General Charges $$ ACUTE PT VISIT: 1 Visit         Norene Ames, PT, DPT Acute Rehabilitation Services Secure chat preferred Office #: [phone]   Norene CHRISTELLA Ames 11/30/2023, 2:25 PM

## 2023-11-30 NOTE — Progress Notes (Signed)
 PHARMACY - ANTICOAGULATION CONSULT NOTE  Pharmacy Consult:  Heparin  Indication: History of VTE with recurrent CVAs  Allergies  Allergen Reactions   Bee Venom Anaphylaxis    Patient Measurements: Height: 5' 10 (177.8 cm) Weight: 101.8 kg (224 lb 6.9 oz) IBW/kg (Calculated) : 73 HEPARIN  DW (KG): 94.4  Vital Signs: Temp: 98.1 F (36.7 C) (08/13 0400) Temp Source: Oral (08/13 0400) BP: 114/60 (08/13 0900) Pulse Rate: 78 (08/13 0900)  Labs: Recent Labs    11/28/23 0339 11/29/23 2018 11/30/23 0537 11/30/23 0924  HGB 9.5* 9.0* 8.5*  --   HCT 29.8* 28.0* 27.7*  --   PLT 113* 119* 100*  --   LABPROT  --   --   --  16.1*  INR  --   --   --  1.2  CREATININE 1.36* 1.10 1.16  --     Estimated Creatinine Clearance: 65.8 mL/min (by C-G formula based on SCr of 1.16 mg/dL).   Medical History: Past Medical History:  Diagnosis Date   Abnormal stress test minimal ischemia involving apex 05/01/2019   Bladder cancer (HCC)    Dilated cardiomyopathy (HCC)    Essential hypertension 12/18/2015   History of DVT (deep vein thrombosis) 04/22/2017   History of pulmonary embolism 12/18/2015   Laryngeal cancer (HCC) squamous cell 02/25/2020   PC (prostate cancer) (HCC) 08/28/2019     Assessment: 73 YOM with 2 recent L MCA strokes s/p MT each time.  Patient went to Rehab and was receiving Eliquis , last dose on 8/12 at 0847.  Patient returned to the inpatient side with recurrent CVA on 11/29/23.  MRI shows petechial hemorrhage with new/increased scattered punctate infarcts and new microhemorrhages.  Pharmacy consulted to dose IV heparin  bridge to Coumadin .  Stopping Eliquis .  Renal function stable and mild LFTs bump.  CBC drifting down; no overt bleeding.  Baseline INR at 1.2.  Eating well.  Goal of Therapy:  INR 2-3 Heparin  level 0.3-0.5 units/ml Monitor platelets by anticoagulation protocol: Yes   Plan:  No bolus  Start IV heparin  at 1100 units/hr Check 8 hr heparin   level Coumadin  7.5mg  PO today Daily heparin  level, aPTT, PT / INR and CBC  Treavor Blomquist D. L[place], PharmD, BCPS, BCCCP 11/30/2023, 10:19 AM

## 2023-11-30 NOTE — Progress Notes (Signed)
 PHARMACY - ANTICOAGULATION CONSULT NOTE  Pharmacy Consult:  Heparin  Indication: History of VTE with recurrent CVAs  Allergies  Allergen Reactions   Bee Venom Anaphylaxis    Patient Measurements: Height: 5' 10 (177.8 cm) Weight: 101.8 kg (224 lb 6.9 oz) IBW/kg (Calculated) : 73 HEPARIN  DW (KG): 94.4  Vital Signs: Temp: 97.9 F (36.6 C) (08/13 2000) Temp Source: Axillary (08/13 2000) BP: 123/67 (08/13 2000) Pulse Rate: 75 (08/13 2000)  Labs: Recent Labs    11/28/23 0339 11/29/23 2018 11/30/23 0537 11/30/23 0924 11/30/23 2026  HGB 9.5* 9.0* 8.5*  --   --   HCT 29.8* 28.0* 27.7*  --   --   PLT 113* 119* 100*  --   --   APTT  --   --   --   --  98*  LABPROT  --   --   --  16.1*  --   INR  --   --   --  1.2  --   HEPARINUNFRC  --   --   --   --  >1.10*  CREATININE 1.36* 1.10 1.16  --   --     Estimated Creatinine Clearance: 65.8 mL/min (by C-G formula based on SCr of 1.16 mg/dL).   Medical History: Past Medical History:  Diagnosis Date   Abnormal stress test minimal ischemia involving apex 05/01/2019   Bladder cancer (HCC)    Dilated cardiomyopathy (HCC)    Essential hypertension 12/18/2015   History of DVT (deep vein thrombosis) 04/22/2017   History of pulmonary embolism 12/18/2015   Laryngeal cancer (HCC) squamous cell 02/25/2020   PC (prostate cancer) (HCC) 08/28/2019     Assessment: 73 YOM with 2 recent L MCA strokes s/p MT each time.  Patient went to Rehab and was receiving Eliquis , last dose on 8/12 at 0847.  Patient returned to the inpatient side with recurrent CVA on 11/29/23.  MRI shows petechial hemorrhage with new/increased scattered punctate infarcts and new microhemorrhages.  Pharmacy consulted to dose IV heparin  bridge to Coumadin .  Stopping Eliquis .  Renal function stable and mild LFTs bump.  CBC drifting down; no overt bleeding.  Baseline INR at 1.2.  Eating well.  8/13 PM update: HL >1.10 aPTT 98 seconds No signs of bleeding / pauses in  gtt  Goal of Therapy:  INR 2-3 Heparin  level 0.3-0.5 units/ml Monitor platelets by anticoagulation protocol: Yes   Plan:   Decrease IV heparin  at 900 units/hr Check 8 hr aPTT / heparin  level Daily heparin  level, aPTT, PT / INR and CBC   Norvel Wenker BS, PharmD, BCPS Clinical Pharmacist 11/30/2023 9:11 PM  Contact: [phone] after 3 PM

## 2023-11-30 NOTE — Progress Notes (Signed)
 Physical Therapy Note  Patient Details  Name: Allen Macdonald MRN: [id number] Date of Birth: [birth year] Today's Date: 11/30/2023    Physical Therapy Discharge Note  This patient was unable to complete the inpatient rehab program due to acute change in medical condition; therefore did not meet their long term goals. Pt left the program at a min assist level for their functional mobility/ transfers. This patient is being discharged from PT services at this time.  Pt's perception of pain in the last five days was unable to answer at this time.    See CareTool for functional status details  If the patient is able to return to inpatient rehabilitation within 3 midnights, this may be considered an interrupted stay and therapy services will resume as ordered. Modification and reinstatement of their goals will be made upon completion of therapy service reevaluations.     Reche Ohara PT, DPT 11/30/2023, 1:05 PM

## 2023-11-30 NOTE — Evaluation (Signed)
 Occupational Therapy Evaluation Patient Details Name: Allen Macdonald MRN: [id number] DOB: Jul 23, 1950 Today's Date: 11/30/2023   History of Present Illness   73 y.o. male who developed sudden onset of right facial droop, increased right-sided weakness and aphasia on 8/12 at [facility].  CT revealed a thrombus in left ICA terminus with occlusion of left M1 and a near occlusive thrombus of basilar tip.  Patient was seen for mechanical thrombectomy. PMH: Pt in [facility] on 11/20/2023 with R weakness, L gaze and expressive aphasia. CTA with L ICA occlusion, pt underwent thrombectomy on 8/3. Pt with neuro change on 8/4, found to have L M1 occlusion and underwent subsequent thrombectomy and found to have L MCA dissection,  HTN, DVT/PE, bladder CA s/p nephrostomy..     Clinical Impressions Patient seen in conjunction with PT in order to complete re-evaulation post thrombectomy. Patient remains with expressive aphasia, cognitive deficits, and noted flexor tone in RUE. Patient is also mod A for ADL management with decreased motor planning and sequencing noted. Patient indicating discomfort but unable to express why with BP noted to be 82/61 (61) in standing. BP recovering to 116/60 (77) when seated. OT recommendation is to return to intensive rehab setting when medically appopriate, OT will continue to follow.      If plan is discharge home, recommend the following:   A lot of help with walking and/or transfers;A lot of help with bathing/dressing/bathroom;Assistance with cooking/housework;Direct supervision/assist for medications management;Direct supervision/assist for financial management;Assist for transportation;Help with stairs or ramp for entrance;Supervision due to cognitive status     Functional Status Assessment   Patient has had a recent decline in their functional status and demonstrates the ability to make significant improvements in function in a reasonable and predictable amount of time.      Equipment Recommendations   Other (comment) (defer to next venue)     Recommendations for Other Services   Rehab consult     Precautions/Restrictions   Precautions Precautions: Fall;Other (comment) Recall of Precautions/Restrictions: Intact Precaution/Restrictions Comments: bilat nephrostomy bags Restrictions Weight Bearing Restrictions Per Provider Order: No     Mobility Bed Mobility Overal bed mobility: Needs Assistance Bed Mobility: Supine to Sit     Supine to sit: Min assist, Used rails, HOB elevated     General bed mobility comments: increased time,cues for sequencing. Assist to scoot hips forward    Transfers Overall transfer level: Needs assistance Equipment used: Rolling walker (2 wheels) Transfers: Sit to/from Stand Sit to Stand: Min assist     Step pivot transfers: Mod assist     General transfer comment: Assist to maintain balance and manage RW. Initial assist to place R hand but able to grip RW throughout transfer. Cues for sequencing and posture. No knee buckling noted. Pt with report of dizziness, noted orthostatic BP, 108/61 in supine to 82/61 in standing, returned to 116/60 once in chair      Balance Overall balance assessment: Needs assistance Sitting-balance support: Feet supported, No upper extremity supported Sitting balance-Leahy Scale: Fair     Standing balance support: Bilateral upper extremity supported, During functional activity, Reliant on assistive device for balance Standing balance-Leahy Scale: Poor Standing balance comment: with RW support                           ADL either performed or assessed with clinical judgement   ADL Overall ADL's : Needs assistance/impaired Eating/Feeding: Set up;Bed level Eating/Feeding Details (indicate cue type and reason): self  feeding L hand Grooming: Set up;Sitting   Upper Body Bathing: Minimal assistance;Sitting   Lower Body Bathing: Maximal assistance;Moderate  assistance;Sitting/lateral leans;Sit to/from stand   Upper Body Dressing : Minimal assistance;Sitting   Lower Body Dressing: Maximal assistance;Moderate assistance;Sit to/from stand;Sitting/lateral leans   Toilet Transfer: Moderate assistance;Stand-pivot;BSC/3in1 Statistician Details (indicate cue type and reason): simulated Toileting- Clothing Manipulation and Hygiene: Total assistance Toileting - Clothing Manipulation Details (indicate cue type and reason): unaware of minimal BM     Functional mobility during ADLs: Moderate assistance;Cueing for sequencing;Cueing for safety;Rolling walker (2 wheels) General ADL Comments: Patient seen in conjunction with PT in order to complete re-evaulation post thrombectomy. Patient remains with expressive aphasia, cognitive deficits, and noted flexor tone in RUE. Patient is also mod A for ADL management with decreased motor planning and sequencing noted. Patient indicating discomfort but unable to express why with BP noted to be 82/61 (61) in standing. BP recovering to 116/60 (77) when seated. OT recommendation is to return to intensive rehab setting when medically appopriate, OT will continue to follow.     Vision Baseline Vision/History: 0 No visual deficits Ability to See in Adequate Light: 0 Adequate Patient Visual Report: No change from baseline Vision Assessment?: No apparent visual deficits Additional Comments: Will continue to assess     Perception Perception: Impaired Preception Impairment Details: Inattention/Neglect     Praxis Praxis: Impaired Praxis Impairment Details: Motor planning     Pertinent Vitals/Pain Pain Assessment Pain Assessment: No/denies pain     Extremity/Trunk Assessment Upper Extremity Assessment Upper Extremity Assessment: Left hand dominant;RUE deficits/detail RUE Deficits / Details: shoulder AROM to 90, flexor tone noted RUE Sensation: WNL   Lower Extremity Assessment Lower Extremity Assessment: Defer  to PT evaluation RLE Deficits / Details: ROM WFL, grossly 4/5 but weaker compared to L   Cervical / Trunk Assessment Cervical / Trunk Assessment: Normal   Communication Communication Communication: Impaired Factors Affecting Communication: Difficulty expressing self   Cognition Arousal: Alert Behavior During Therapy: Flat affect Cognition: Cognition impaired Difficult to assess due to: Impaired communication Orientation impairments: Time Awareness: Online awareness impaired, Intellectual awareness impaired Memory impairment (select all impairments): Short-term memory, Working memory, Non-declarative long-term memory, Geneticist, molecular long-term memory Attention impairment (select first level of impairment): Sustained attention Executive functioning impairment (select all impairments): Initiation, Organization, Sequencing, Reasoning, Problem solving OT - Cognition Comments: Expressive difficulties, however could not identify what was bothering him and making noises indicating discomfort, noted to be dizzy with BP                 Following commands: Impaired Following commands impaired: Follows multi-step commands inconsistently, Only follows one step commands consistently, Follows one step commands with increased time     Cueing  General Comments   Cueing Techniques: Verbal cues;Gestural cues;Tactile cues  orthostatic BP, see transfer section   Exercises     Shoulder Instructions      Home Living Family/patient expects to be discharged to:: Private residence Living Arrangements: Spouse/significant other Available Help at Discharge: Family;Available 24 hours/day Type of Home: House Home Access: Stairs to enter [employer] of Steps: 3 Entrance Stairs-Rails: Can reach both Home Layout: One level         Bathroom Toilet: Standard Bathroom Accessibility: Yes   Home Equipment: Cane - single point;Toilet riser;Rolling Walker (2 wheels);Rollator (4  wheels);Wheelchair - Manufacturing systems engineer      Lives With: Spouse    Prior Functioning/Environment Prior Level of Function : Other (comment) (was at [facility])  Mobility Comments: was amb with RW in rehab ADLs Comments: working towards indep    OT Problem List: Decreased strength;Decreased range of motion;Decreased activity tolerance;Impaired balance (sitting and/or standing);Impaired vision/perception;Decreased cognition;Decreased safety awareness;Decreased coordination;Impaired sensation;Decreased knowledge of precautions;Cardiopulmonary status limiting activity;Impaired tone;Impaired UE functional use   OT Treatment/Interventions: Self-care/ADL training;Therapeutic exercise;Energy conservation;DME and/or AE instruction;Cognitive remediation/compensation;Therapeutic activities;Visual/perceptual remediation/compensation;Patient/family education;Balance training;Neuromuscular education;Manual therapy      OT Goals(Current goals can be found in the care plan section)   Acute Rehab OT Goals Patient Stated Goal: to get back to rehab OT Goal Formulation: With patient Time For Goal Achievement: 12/07/23 Potential to Achieve Goals: Good ADL Goals Pt Will Perform Lower Body Bathing: with min assist;sitting/lateral leans;sit to/from stand Pt Will Perform Lower Body Dressing: with min assist;sitting/lateral leans;sit to/from stand Pt Will Transfer to Toilet: with contact guard assist;regular height toilet;grab bars Pt Will Perform Toileting - Clothing Manipulation and hygiene: with contact guard assist;sitting/lateral leans;sit to/from stand Additional ADL Goal #1: Patient will be able to follow 2 step commands consistently prior to transitioning to upper level cognitive tasks in order to promote increased independence. Additional ADL Goal #2: Patient will be able to complete ADL/IADL routine in standing without cues to initate or assist in order to promote functional independence.    OT Frequency:  Min 2X/week    Co-evaluation   Reason for Co-Treatment: Complexity of the patient's impairments (multi-system involvement);For patient/therapist safety;To address functional/ADL transfers PT goals addressed during session: Mobility/safety with mobility;Balance;Strengthening/ROM        AM-PAC OT 6 Clicks Daily Activity     Outcome Measure Help from another person eating meals?: A Little Help from another person taking care of personal grooming?: A Little Help from another person toileting, which includes using toliet, bedpan, or urinal?: A Lot Help from another person bathing (including washing, rinsing, drying)?: A Lot Help from another person to put on and taking off regular upper body clothing?: A Little Help from another person to put on and taking off regular lower body clothing?: A Lot 6 Click Score: 15   End of Session Equipment Utilized During Treatment: Gait belt;Rolling walker (2 wheels) Nurse Communication: Mobility status;Other (comment) (BP)  Activity Tolerance: Patient tolerated treatment well Patient left: in chair;with call bell/phone within reach;with chair alarm set  OT Visit Diagnosis: Unsteadiness on feet (R26.81);Other abnormalities of gait and mobility (R26.89);Muscle weakness (generalized) (M62.81);Other symptoms and signs involving cognitive function;Cognitive communication deficit (R41.841) Symptoms and signs involving cognitive functions: Cerebral infarction                Time: 8973-8947 OT Time Calculation (min): 26 min Charges:  OT General Charges $OT Visit: 1 Visit OT Evaluation $OT Eval Moderate Complexity: 1 Mod  Ronal Gift E. Dereonna Lensing, OTR/L Acute Rehabilitation Services [phone]   Ronal Gift Salt 11/30/2023, 4:05 PM

## 2023-11-30 NOTE — Progress Notes (Signed)
 Inpatient Rehabilitation Care Coordinator Discharge Note   Patient Details  Name: Allen Macdonald MRN: [id number] Date of Birth: 01-31-1951   Discharge location: transferred to acute for medical issues  Length of Stay: 4 days  Discharge activity level: mod level  Home/community participation: active  Patient response un:Yzjouy Literacy - How often do you need to have someone help you when you read instructions, pamphlets, or other written material from your doctor or pharmacy?: Never  Patient response un:Dnrpjo Isolation - How often do you feel lonely or isolated from those around you?: Patient unable to respond  Services provided included: MD, RD, PT, OT, RN, CM, Pharmacy, SW  Financial Services:  Financial Services Utilized: Private Insurance [employer]  Choices offered to/list presented to: na  Follow-up services arranged:  Other (Comment) (transferred to acute)           Patient response to transportation need: Is the patient able to respond to transportation needs?: Yes In the past 12 months, has lack of transportation kept you from medical appointments or from getting medications?: No In the past 12 months, has lack of transportation kept you from meetings, work, or from getting things needed for daily living?: No   Patient/Family verbalized understanding of follow-up arrangements:  Yes  Individual responsible for coordination of the follow-up plan: karen-wife [phone]  Confirmed correct DME delivered: [name] 11/30/2023    Comments (or additional information):medical change so transferred to acute will need to return since only was on cir for 4 days    Donnavin Vandenbrink G

## 2023-11-30 NOTE — Plan of Care (Signed)
 Patient experienced a medical condition change; discharged to acute services and unable to complete the CIR program

## 2023-11-30 NOTE — Progress Notes (Addendum)
 STROKE TEAM PROGRESS NOTE    SIGNIFICANT HOSPITAL EVENTS 8/12 patient developed sudden onset of right facial droop, increased right-sided weakness and aphasia.  CT a with thrombus in left ICA terminus with occlusion of left M1 and a near occlusive thrombus of basilar tip.  Patient was seen for mechanical thrombectomy  INTERIM HISTORY/SUBJECTIVE Patient with known history of hypercoagulability from bladder cancer with history of DVT, PE who underwent emergent left MCA thrombectomy on 11/21/2023 presented with sudden onset of neurochange and on CT angiogram yesterday was found with basilar tip embolism as well as left M2 embolism and underwent successful thrombectomy of both vessels.   Patient is doing quite well.  He is sitting up in bed.  Wife is at the bedside.  Patient is awake and alert in no apparent distress.  He does have mild expressive aphasia and right facial and hand weakness but is otherwise doing well No new neurological events overnight.  Patient will be started on IV heparin  with a bridge to Coumadin  due to his recurring strokes requiring thrombectomy Wife and patient are in agreement  CBC    Component Value Date/Time   WBC 6.6 11/30/2023 0537   RBC 2.92 (L) 11/30/2023 0537   HGB 8.5 (L) 11/30/2023 0537   HGB 9.0 (L) 07/15/2023 1309   HGB 13.5 04/22/2017 1023   HCT 27.7 (L) 11/30/2023 0537   HCT 39.2 04/22/2017 1023   PLT 100 (L) 11/30/2023 0537   PLT 170 07/15/2023 1309   PLT 164 04/22/2017 1023   MCV 94.9 11/30/2023 0537   MCV 95 04/22/2017 1023   MCH 29.1 11/30/2023 0537   MCHC 30.7 11/30/2023 0537   RDW 18.6 (H) 11/30/2023 0537   RDW 14.0 04/22/2017 1023   LYMPHSABS 1.3 11/28/2023 0339   MONOABS 0.5 11/28/2023 0339   EOSABS 0.2 11/28/2023 0339   BASOSABS 0.1 11/28/2023 0339    BMET    Component Value Date/Time   NA 139 11/30/2023 0537   NA 139 07/05/2019 1045   K 3.9 11/30/2023 0537   CL 109 11/30/2023 0537   CO2 23 11/30/2023 0537   GLUCOSE 90 11/30/2023  0537   BUN 20 11/30/2023 0537   BUN 11 07/05/2019 1045   CREATININE 1.16 11/30/2023 0537   CALCIUM  8.3 (L) 11/30/2023 0537   GFRNONAA >60 11/30/2023 0537    IMAGING past 24 hours MR BRAIN WO CONTRAST Result Date: 11/30/2023 CLINICAL DATA:  73 year old male with 3 episodes of recurrent distal Left ICA/Left MCA ELVO beginning on 11/20/2023. Status post NIR x three. EXAM: MRI HEAD WITHOUT CONTRAST TECHNIQUE: Multiplanar, multiecho pulse sequences of the brain and surrounding structures were obtained without intravenous contrast. COMPARISON:  Most recent CTA head and neck yesterday. Brain MRI 11/22/2023 and earlier. FINDINGS: Brain: Patchy and widely scattered generally small foci of restricted diffusion in the left MCA territory, pattern not significantly changed compared to 11/22/2023, and extent of diffusion abnormality has mildly regressed since that time. Contralateral right occipital lobe and new small left greater than right cerebellar foci of diffusion restriction are new and/or increased. Brainstem and thalami remain spared. Basal ganglia also remain relatively spared. T2 and FLAIR hyperintense cytotoxic edema associated with the small acute and subacute lesions. Areas of petechial hemorrhage: Heidelberg classification 1a: HI1, scattered small petechiae, no mass effect., that are not significantly changed in the left MCA territory from 11/22/2023. Occasional new punctate microhemorrhages elsewhere, including the bilateral cerebellum and right occipital lobe (series 12, image 27). No malignant hemorrhagic transformation. No midline  shift, mass effect, evidence of mass lesion, ventriculomegaly, extra-axial collection. Cervicomedullary junction and pituitary are within normal limits. Vascular: Major intracranial vascular flow voids are preserved, stable compared to 11/22/2023. Skull and upper cervical spine: Stable and negative. Sinuses/Orbits: Stable, mild paranasal sinus fluid and mucosal thickening.  Other: Mastoids remain well aerated. Negative visible scalp and face. IMPRESSION: 1. Scattered Left MCA territory infarcts, with a pattern similar to 11/22/2023, and extent mildly regressed since that time. Stable Heidelberg classification 1a: HI1 petechial hemorrhage. 2. New/increased scattered and generally punctate infarcts in the right occipital and bilateral cerebellar hemispheres. Several new micro-hemorrhages in those areas also. 3. No malignant hemorrhagic transformation. No intracranial mass effect. Electronically Signed   By: VEAR Hurst M.D.   On: 11/30/2023 05:21   CT ANGIO HEAD NECK W WO CM (CODE STROKE) Result Date: 11/29/2023 CLINICAL DATA:  Neuro deficit, acute, stroke suspected. Aphasia, right facial droop, and right arm weakness. EXAM: CT ANGIOGRAPHY HEAD AND NECK WITH AND WITHOUT CONTRAST TECHNIQUE: Multidetector CT imaging of the head and neck was performed using the standard protocol during bolus administration of intravenous contrast. Multiplanar CT image reconstructions and MIPs were obtained to evaluate the vascular anatomy. Carotid stenosis measurements (when applicable) are obtained utilizing NASCET criteria, using the distal internal carotid diameter as the denominator. RADIATION DOSE REDUCTION: This exam was performed according to the departmental dose-optimization program which includes automated exposure control, adjustment of the mA and/or kV according to patient size and/or use of iterative reconstruction technique. CONTRAST:  75mL OMNIPAQUE  IOHEXOL  350 MG/ML SOLN COMPARISON:  CTA head and neck 11/21/2023.  MRA head 11/22/2023. FINDINGS: CTA NECK FINDINGS Aortic arch: Normal variant aortic arch branching pattern with common origin of the brachiocephalic and left common carotid arteries. Widely patent arch vessel origins. Right carotid system: Patent with minimal calcified and soft plaque at the carotid bifurcation. No evidence of a significant stenosis or dissection. Left carotid  system: Patent with a small amount of calcified and soft plaque at the carotid bifurcation. No evidence of a significant stenosis or dissection. Vertebral arteries: Patent without evidence of a significant stenosis or dissection. Dominant left vertebral artery. Skeleton: Moderate to severe disc and facet degeneration in the cervical spine. Severe bilateral neural foraminal stenosis and at least mild spinal stenosis at C5-6 and C6-7. Other neck: No evidence of cervical lymphadenopathy or mass. Upper chest: Clear lung apices. Review of the MIP images confirms the above findings CTA HEAD FINDINGS Anterior circulation: The intracranial right internal carotid artery is patent with atherosclerosis resulting in at most mild siphon stenosis. The intracranial left ICA is patent proximally with nonstenotic atherosclerosis in the cavernous segment, however there is a new/recurrent thrombus in the ICA terminus resulting in severe stenosis. This involves the left M1 origin which appears occluded over a 3 mm long segment with reconstitution of the distal M1 segment and proximal M2 trunks. However, there is occlusion of multiple mid M2 and more distal left MCA branches in the posterior division. The right MCA and both ACAs are patent with diffuse branch vessel irregularity as well as asymmetric attenuation of distal right ACA branch vessels compared to the left. The right A1 segment is hypoplastic. No aneurysm is identified. Posterior circulation: The intracranial left vertebral artery is strongly dominant widely patent. The intracranial right vertebral artery is hypoplastic with moderate diffuse irregularity as well as an apparent severe stenosis distally which appears increased compared to the prior CTA, although the degree of stenosis may be accentuated by small vessel size  and skull base artifact. The basilar artery is widely patent proximally, however there is a new severe stenosis or near occlusive thrombus at its tip  involving the origins of both PCAs which remain patent. There is a patent left posterior communicating artery. No aneurysm is identified. Venous sinuses: As permitted by contrast timing, patent. Anatomic variants: Hypoplastic right A1 segment. Review of the MIP images confirms the above findings Findings discussed by telephone with Dr. Lindzen on 11/29/2023 at 5:01 p.m. IMPRESSION: 1. New/recurrent thrombus in the left ICA terminus with occlusion of the left M1 origin. Reconstitution of the distal left M1 segment, however there are multiple occluded mid M2 and more distal branches. 2. New severe stenosis or near occlusive thrombus at the basilar tip involving the origins of both PCAs. 3. Increased, severe stenosis of the distal V4 segment of the non-dominant right vertebral artery. 4. Mild cervical carotid atherosclerosis without significant stenosis. Electronically Signed   By: Dasie Hamburg M.D.   On: 11/29/2023 17:46   CT HEAD CODE STROKE WO CONTRAST Result Date: 11/29/2023 CLINICAL DATA:  Code stroke. Neuro deficit, acute, stroke suspected. Slurred speech, right facial droop, and right arm weakness. EXAM: CT HEAD WITHOUT CONTRAST TECHNIQUE: Contiguous axial images were obtained from the base of the skull through the vertex without intravenous contrast. RADIATION DOSE REDUCTION: This exam was performed according to the departmental dose-optimization program which includes automated exposure control, adjustment of the mA and/or kV according to patient size and/or use of iterative reconstruction technique. COMPARISON:  Head CT 11/21/2023 and MRI 11/22/2023 FINDINGS: Brain: Scattered small hypodensities involving cortex and subcortical white matter in the left frontal and parietal lobes, left insula, and left basal ganglia correspond to recent infarcts on MRI, with many of the smaller infarcts on MRI being occult by CT. No acute large territory infarct, intracranial hemorrhage, mass, midline shift, or extra-axial  fluid collection is identified. There is a background of mild chronic small vessel ischemia in the cerebral white matter. Cerebral volume is within normal limits for age. The ventricles are normal in size. Vascular: Calcified atherosclerosis at the skull base. No hyperdense vessel. Skull: No fracture or suspicious lesion. Sinuses/Orbits: Mucosal thickening in the right sphenoid sinus. Clear mastoid air cells. Bilateral cataract extraction. Other: None. ASPECTS ([place] Stroke Program Early CT Score) - Ganglionic level infarction (caudate, lentiform nuclei, internal capsule, insula, M1-M3 cortex): 7 (allowing for the known subacute left MCA infarcts) - Supraganglionic infarction (M4-M6 cortex): 3 Total score (0-10 with 10 being normal): 10 Findings discussed by telephone with Dr. Lindzen on 11/29/2023 at 5:01 p.m. IMPRESSION: Evolving subacute left MCA infarcts. No definite acute infarct or intracranial hemorrhage. Electronically Signed   By: Dasie Hamburg M.D.   On: 11/29/2023 17:05    Vitals:   11/30/23 0800 11/30/23 0900 11/30/23 1000 11/30/23 1100  BP: 108/60 114/60 118/66 (!) 107/55  Pulse: 76 78 82 84  Resp: 16 19 16  (!) 23  Temp: 98.6 F (37 C)     TempSrc: Axillary     SpO2: 95% 95% 96% 98%  Weight:      Height:         PHYSICAL EXAM General:  Alert, well-nourished, well-developed patient in no acute distress Psych:  Mood and affect appropriate for situation CV: Regular rate and rhythm on monitor Respiratory:  Regular, unlabored respirations on room air GI: Abdomen soft and nontender   NEURO:  Mental Status: Awake and alert able to state his name, and age stated wrong place.  He  does have expressive and receptive aphasia with some dysarthria.  Able to name some items   Cranial Nerves:  II: PERRL. Visual fields full.  III, IV, VI: EOMI. Eyelids elevate symmetrically.  V: Sensation is intact to light touch and symmetrical to face.  VII: Right facial droop VIII: hearing intact to  voice. IX, X: Palate elevates symmetrically. Phonation is normal.  KP:Dynloizm shrug 5/5. XII: tongue is midline without fasciculations. Motor: 5/5 strength in left arm and bilateral legs, right arm 4/5 with drift Tone: is normal and bulk is normal Sensation- Intact to light touch bilaterally. Extinction absent to light touch to DSS.   Coordination: Mild ataxia on right, decreased fine motor skills on right Gait- deferred  Most Recent NIH  1a Level of Conscious.:  1b LOC Questions: 1 1c LOC Commands:  2 Best Gaze:  3 Visual:  4 Facial Palsy:  5a Motor Arm - left: 1 5b Motor Arm - Right:  6a Motor Leg - Left:  6b Motor Leg - Right:  7 Limb Ataxia: 1 8 Sensory:  9 Best Language: 1 10 Dysarthria: 1 11 Extinct. and Inatten.:  TOTAL: 5   ASSESSMENT/PLAN  [name] is a 73 y.o. male with history of  2 recent left MCA strokes status post mechanical thrombectomies of both, hypertension, DVT, PE on Eliquis  and bladder cancer status post bilateral nephrostomies who was in rehabilitation after his previous strokes and developed today sudden onset of right facial droop, increased right-sided weakness and expressive and receptive aphasia   NIH on Admission 10  Acute Ischemic Infarct:  right  punctate infarcts in the right occipital and bilateral cerebellar hemispheres. s/p mechanical thrombectomy of left carotid T occlusion and basilar tip occlusion, who with TICI 3 revascularization of both occlusions with mechanical thrombectomy Etiology:  Likely due to hypercoagulable state in the setting of advanced cancer with failure of Eliquis  Code Stroke CT head No acute abnormality. ASPECTS 10. Evolving subacute left MCA infarcts    CTA head & neck New/recurrent thrombus in the left ICA terminus with occlusion of the left M1 origin. Reconstitution of the distal left M1 segment, however there are multiple occluded mid M2 and more distal branches. New severe stenosis or near occlusive thrombus  at the basilar tip involving the origins of both PCAs.  Increased, severe stenosis of the distal V4 segment of the non-dominant right vertebral artery. MRI  Scattered Left MCA territory infarcts, New/increased scattered and generally punctate infarcts in the right occipital and bilateral cerebellar hemispheres. Several new micro-hemorrhages in those areas also. 2D Echo EF 50-55%, bubble study negative US  LE positive for age indeterminate DVTs: RT - PopV and PTV LT - CFV, PFV, FV, PopV, Gastrocs  TCD bubble study no DVT LDL 112 HgbA1c 5.7 VTE prophylaxis - IV heparin   Eliquis  prior to admission, now on IV heparin  with plan to transition to Coumadin   Therapy recommendations:  Pending Disposition: Pending  Hx of Stroke/TIA August 2025: left MCA scattered infarcts with left tICA and M1 occlusion s/p IR with TICI2c  Recurrent stroke: extension of left MCA scattered infarcts with left M1 re-occlusion s/p IR with TICI 2b due to dissection and hypotension.  Discharged to rehab on Eliquis   History of DVT/PE Hx of multiple b/l DVT and PE Had IVC filter placed ~ 2014 Likely related to hypercoagulable state from advanced malignancy Home Meds: Eliquis  2.5 bid US  LE positive for age indeterminate DVTs: RT - PopV and PTV LT - CFV, PFV, FV, PopV, Gastrocs  Hypertension Home meds:  Amlodipine  5 mg, carvedilol  12.5 mg  Stable Blood Pressure Goal: SBP 120-160 for first 24 hours then less than 180   Hyperlipidemia Home meds: Atorvastatin  40mg ,  resumed in hospital LDL 112, goal < 70  Continue statin at discharge  Bladder cancer s/p bilateral nephrostomy tubes  Poorly differentiated bladder cancer, now stage IV, metastatic to the bone by MRI of the spine on 11/08/2023.   Dysphagia Patient has post-stroke dysphagia, SLP consulted    Diet   Diet regular Room service appropriate? Yes; Fluid consistency: Thin   Advance diet as tolerated  Anemia  Hgb 8.5 monitor   Other acute medical  issues Hx of prostate cancer, on ADT H/o laryngeal cancer in 2021. S/p radiation therapy.   Hospital day # 1  Karna Geralds DNP, ACNPC-AG  [facility]  I have personally obtained history,examined this patient, reviewed notes, independently viewed imaging studies, participated in medical decision making and plan of care.ROS completed by me personally and pertinent positives fully documented  I have made any additions or clarifications directly to the above note. Agree with note above.  Patient with a advanced bladder cancer with hypercoagulopathy and prior history of DVT PE with left MCA embolism 8//25 status post successful mechanical thrombectomy was in inpatient rehab on Eliquis  presented with sudden neurological change with embolism to basilar tip and left carotid terminus who had repeat successful mechanical thrombectomy of both occlusions with good revascularization.  Continue close neurological monitoring and strict blood pressure control as per post thrombectomy protocol.  MRI scan shows only small patchy infarcts given patient's hypercoagulability will start him on IV heparin  bridging along with warfarin until warfarin is therapeutic.  Mobilize out of bed.  Therapy consults.  Long discussion with patient and his wife and family about risk benefits of anticoagulation in setting of acute stroke and answered questions.  Discussed with Dr. Lester neurointerventional radiologist. This patient is critically ill and at significant risk of neurological worsening, death and care requires constant monitoring of vital signs, hemodynamics,respiratory and cardiac monitoring, extensive review of multiple databases, frequent neurological assessment, discussion with family, other specialists and medical decision making of high complexity.I have made any additions or clarifications directly to the above note.This critical care time does not reflect procedure time, or teaching time or supervisory time of  PA/NP/Med Resident etc but could involve care discussion time.  I spent 30 minutes of neurocritical care time  in the care of  this patient.      Eather Popp, MD Medical Director [facility] Pager: [phone] 11/30/2023 3:50 PM   To contact Stroke Continuity provider, please refer to [email or web address]. After hours, contact General Neurology

## 2023-11-30 NOTE — Progress Notes (Signed)
 Speech Language Pathology Note  Patient Details  Name: Allen Macdonald MRN: [id number] Date of Birth: [birth year] Today's Date: 11/30/2023                                                                                                                 Speech Therapy Discharge Note  This patient was unable to complete the inpatient rehab program due to change in medical status ; therefore, the patient did not meet their long term goals and has been discharged from skilled SLP services at this time.The patient left the program at a min-mod assist level for overall cognitive and communicative functioning. The patient is currently consuming regular textures with thin liquids with use of swallowing compensatory strategies.   See CareTool for functional status details.  If the patient is able to return to inpatient rehabilitation within 3 midnights, this may be considered an interrupted stay and therapy services will resume as ordered. Modification and reinstatement of their goals will be made upon completion of therapy service reevaluations.     Waynette Towers M.A., CCC-SLP 11/30/2023, 7:42 AM

## 2023-12-01 DIAGNOSIS — C679 Malignant neoplasm of bladder, unspecified: Secondary | ICD-10-CM | POA: Diagnosis not present

## 2023-12-01 DIAGNOSIS — R29705 NIHSS score 5: Secondary | ICD-10-CM | POA: Diagnosis not present

## 2023-12-01 DIAGNOSIS — I63543 Cerebral infarction due to unspecified occlusion or stenosis of bilateral cerebellar arteries: Secondary | ICD-10-CM | POA: Diagnosis not present

## 2023-12-01 DIAGNOSIS — I63412 Cerebral infarction due to embolism of left middle cerebral artery: Secondary | ICD-10-CM | POA: Diagnosis not present

## 2023-12-01 LAB — CBC
HCT: 27.1 % — ABNORMAL LOW (ref 39.0–52.0)
Hemoglobin: 8.5 g/dL — ABNORMAL LOW (ref 13.0–17.0)
MCH: 29.5 pg (ref 26.0–34.0)
MCHC: 31.4 g/dL (ref 30.0–36.0)
MCV: 94.1 fL (ref 80.0–100.0)
Platelets: 111 K/uL — ABNORMAL LOW (ref 150–400)
RBC: 2.88 MIL/uL — ABNORMAL LOW (ref 4.22–5.81)
RDW: 18.6 % — ABNORMAL HIGH (ref 11.5–15.5)
WBC: 5.6 K/uL (ref 4.0–10.5)
nRBC: 0 % (ref 0.0–0.2)

## 2023-12-01 LAB — PROTIME-INR
INR: 1.2 (ref 0.8–1.2)
Prothrombin Time: 15.8 s — ABNORMAL HIGH (ref 11.4–15.2)

## 2023-12-01 LAB — APTT
aPTT: 86 s — ABNORMAL HIGH (ref 24–36)
aPTT: 59 s — ABNORMAL HIGH (ref 24–36)

## 2023-12-01 LAB — HEPARIN LEVEL (UNFRACTIONATED): Heparin Unfractionated: 0.87 [IU]/mL — ABNORMAL HIGH (ref 0.30–0.70)

## 2023-12-01 MED ORDER — WARFARIN SODIUM 7.5 MG PO TABS
7.5000 mg | ORAL_TABLET | Freq: Once | ORAL | Status: AC
  Administered 2023-12-01: 7.5 mg via ORAL
  Filled 2023-12-01: qty 1

## 2023-12-01 NOTE — Progress Notes (Signed)
 PHARMACY - ANTICOAGULATION CONSULT NOTE  Pharmacy Consult:  Heparin  Indication: History of VTE with recurrent CVAs  Allergies  Allergen Reactions   Bee Venom Anaphylaxis    Patient Measurements: Height: 5' 10 (177.8 cm) Weight: 101.8 kg (224 lb 6.9 oz) IBW/kg (Calculated) : 73 HEPARIN  DW (KG): 94.4  Vital Signs: Temp: 98 F (36.7 C) (08/14 1541) Temp Source: Oral (08/14 1541) BP: 133/62 (08/14 1900) Pulse Rate: 81 (08/14 1900)  Labs: Recent Labs    11/29/23 2018 11/30/23 0537 11/30/23 0924 11/30/23 2026 12/01/23 0626 12/01/23 1752  HGB 9.0* 8.5*  --   --  8.5*  --   HCT 28.0* 27.7*  --   --  27.1*  --   PLT 119* 100*  --   --  111*  --   APTT  --   --   --  98* 86* 59*  LABPROT  --   --  16.1*  --  15.8*  --   INR  --   --  1.2  --  1.2  --   HEPARINUNFRC  --   --   --  >1.10* 0.87*  --   CREATININE 1.10 1.16  --   --   --   --     Estimated Creatinine Clearance: 65.8 mL/min (by C-G formula based on SCr of 1.16 mg/dL).  Assessment: 50 YOM with 2 recent L MCA strokes s/p MT each time.  Patient went to Rehab and was receiving Eliquis , last dose on 8/12 at 0847.  Patient returned to the inpatient side with recurrent CVA on 11/29/23.  MRI shows petechial hemorrhage with new/increased scattered punctate infarcts and new microhemorrhages.  Pharmacy consulted to dose IV heparin  bridge to Coumadin .  Stopping Eliquis .   8/14 PM: aPTT subtherapeutic at 59 sec on 800 units/hr. INR 1.2. No reported signs or symptoms of bleeding, no issues with heparin  line per RN. Hgb and plts stable.  Goal of Therapy:  INR 2-3 Heparin  level 0.3-0.5 units/ml aPTT 66-85 seconds Monitor platelets by anticoagulation protocol: Yes   Plan:  Increase heparin  infusion to 900 units/hr Check 8 hr aPTT Received Coumadin  7.5mg  PO today Daily heparin  level, aPTT, PT / INR and CBC Monitor signs and symptoms of bleeding daily  Brian Kocourek, PharmD PGY-1 Pharmacy Resident [facility]  12/01/2023 7:13 PM

## 2023-12-01 NOTE — Progress Notes (Addendum)
 STROKE TEAM PROGRESS NOTE    SIGNIFICANT HOSPITAL EVENTS 8/12 patient developed sudden onset of right facial droop, increased right-sided weakness and aphasia.  CT a with thrombus in left ICA terminus with occlusion of left M1 and a near occlusive thrombus of basilar tip.  Patient was seen for mechanical thrombectomy  INTERIM HISTORY/SUBJECTIVE Wife is at the bedside.  Patient is sitting in the bed in no apparent distress.  No new neurological events overnight. Patient remains on IV heparin  Labs and vitals are stable.  Will transfer patient out of the ICU today  CBC    Component Value Date/Time   WBC 5.6 12/01/2023 0626   RBC 2.88 (L) 12/01/2023 0626   HGB 8.5 (L) 12/01/2023 0626   HGB 9.0 (L) 07/15/2023 1309   HGB 13.5 04/22/2017 1023   HCT 27.1 (L) 12/01/2023 0626   HCT 39.2 04/22/2017 1023   PLT 111 (L) 12/01/2023 0626   PLT 170 07/15/2023 1309   PLT 164 04/22/2017 1023   MCV 94.1 12/01/2023 0626   MCV 95 04/22/2017 1023   MCH 29.5 12/01/2023 0626   MCHC 31.4 12/01/2023 0626   RDW 18.6 (H) 12/01/2023 0626   RDW 14.0 04/22/2017 1023   LYMPHSABS 1.3 11/28/2023 0339   MONOABS 0.5 11/28/2023 0339   EOSABS 0.2 11/28/2023 0339   BASOSABS 0.1 11/28/2023 0339    BMET    Component Value Date/Time   NA 139 11/30/2023 0537   NA 139 07/05/2019 1045   K 3.9 11/30/2023 0537   CL 109 11/30/2023 0537   CO2 23 11/30/2023 0537   GLUCOSE 90 11/30/2023 0537   BUN 20 11/30/2023 0537   BUN 11 07/05/2019 1045   CREATININE 1.16 11/30/2023 0537   CALCIUM  8.3 (L) 11/30/2023 0537   GFRNONAA >60 11/30/2023 0537    IMAGING past 24 hours No results found.   Vitals:   12/01/23 0800 12/01/23 0900 12/01/23 1000 12/01/23 1100  BP: (!) 117/56 124/63 113/69 115/76  Pulse: 76 79 98 89  Resp: 18 18 (!) 24 (!) 21  Temp:      TempSrc:      SpO2: 100% 99% 100% 97%  Weight:      Height:         PHYSICAL EXAM General:  Alert, well-nourished, well-developed patient in no acute  distress Psych:  Mood and affect appropriate for situation CV: Regular rate and rhythm on monitor Respiratory:  Regular, unlabored respirations on room air GI: Abdomen soft and nontender   NEURO:  Mental Status: Awake and alert able to state his name, and age stated wrong place.  He does have expressive and receptive aphasia with some dysarthria.  Able to name some items   Cranial Nerves:  II: PERRL. Visual fields full.  III, IV, VI: EOMI. Eyelids elevate symmetrically.  V: Sensation is intact to light touch and symmetrical to face.  VII: Right facial droop VIII: hearing intact to voice. IX, X: Palate elevates symmetrically. Phonation is normal.  KP:Dynloizm shrug 5/5. XII: tongue is midline without fasciculations. Motor: 5/5 strength in left arm and bilateral legs, right arm 4/5 with drift Tone: is normal and bulk is normal Sensation- Intact to light touch bilaterally. Extinction absent to light touch to DSS.   Coordination: Mild ataxia on right, decreased fine motor skills on right Gait- deferred  Most Recent NIH  1a Level of Conscious.:  1b LOC Questions: 1 1c LOC Commands:  2 Best Gaze:  3 Visual:  4 Facial Palsy:  5a  Motor Arm - left: 1 5b Motor Arm - Right:  6a Motor Leg - Left:  6b Motor Leg - Right:  7 Limb Ataxia: 1 8 Sensory:  9 Best Language: 1 10 Dysarthria: 1 11 Extinct. and Inatten.:  TOTAL: 5   ASSESSMENT/PLAN  Allen Macdonald is a 73 y.o. male with history of  2 recent left MCA strokes status post mechanical thrombectomies of both, hypertension, DVT, PE on Eliquis  and bladder cancer status post bilateral nephrostomies who was in rehabilitation after his previous strokes and developed today sudden onset of right facial droop, increased right-sided weakness and expressive and receptive aphasia   NIH on Admission 10  Acute Ischemic Infarct:  right  punctate infarcts in the right occipital and bilateral cerebellar hemispheres. s/p mechanical  thrombectomy of left carotid T occlusion and basilar tip occlusion, with TICI 3 revascularization of both occlusions with mechanical thrombectomy Etiology:  Likely due to hypercoagulable state in the setting of advanced cancer with failure of Eliquis  Code Stroke CT head No acute abnormality. ASPECTS 10. Evolving subacute left MCA infarcts    CTA head & neck New/recurrent thrombus in the left ICA terminus with occlusion of the left M1 origin. Reconstitution of the distal left M1 segment, however there are multiple occluded mid M2 and more distal branches. New severe stenosis or near occlusive thrombus at the basilar tip involving the origins of both PCAs.  Increased, severe stenosis of the distal V4 segment of the non-dominant right vertebral artery. MRI  Scattered Left MCA territory infarcts, New/increased scattered and generally punctate infarcts in the right occipital and bilateral cerebellar hemispheres. Several new micro-hemorrhages in those areas also. 2D Echo EF 50-55%, bubble study negative US  LE positive for age indeterminate DVTs: RT - PopV and PTV LT - CFV, PFV, FV, PopV, Gastrocs  TCD bubble study no DVT LDL 112 HgbA1c 5.7 VTE prophylaxis - IV heparin   Eliquis  prior to admission, now on IV heparin  with plan to transition to Coumadin   Therapy recommendations:  CIR Disposition: Pending  Hx of Stroke/TIA August 2025: left MCA scattered infarcts with left tICA and M1 occlusion s/p IR with TICI2c  Recurrent stroke: extension of left MCA scattered infarcts with left M1 re-occlusion s/p IR with TICI 2b due to dissection and hypotension.  Discharged to rehab on Eliquis   History of DVT/PE Hx of multiple b/l DVT and PE Had IVC filter placed ~ 2014 Likely related to hypercoagulable state from advanced malignancy Home Meds: Eliquis  2.5 bid US  LE positive for age indeterminate DVTs: RT - PopV and PTV LT - CFV, PFV, FV, PopV, Gastrocs   Hypertension Home meds:  Amlodipine  5 mg, carvedilol   12.5 mg  Stable Blood Pressure Goal: SBP 120-160 for first 24 hours then less than 180   Hyperlipidemia Home meds: Atorvastatin  40mg ,  resumed in hospital LDL 112, goal < 70  Continue statin at discharge  Bladder cancer s/p bilateral nephrostomy tubes  Poorly differentiated bladder cancer, now stage IV, metastatic to the bone by MRI of the spine on 11/08/2023.   Dysphagia Patient has post-stroke dysphagia, SLP consulted    Diet   Diet regular Room service appropriate? Yes; Fluid consistency: Thin   Advance diet as tolerated  Anemia  Hgb 8.5 monitor   Other acute medical issues Hx of prostate cancer, on ADT H/o laryngeal cancer in 2021. S/p radiation therapy.   Hospital day # 2  Karna Geralds DNP, ACNPC-AG  [facility] I have personally obtained history,examined this patient, reviewed notes, independently  viewed imaging studies, participated in medical decision making and plan of care.ROS completed by me personally and pertinent positives fully documented  I have made any additions or clarifications directly to the above note. Agree with note above.  Patient continues to do well with only mild right facial and hand weakness.  Continue IV heparin  and warfarin.  Continue ongoing therapies.  Transfer out of ICU to neurology floor bed.  Expect transfer to inpatient rehab over the next few days after insurance approval.  Long discussion with patient and wife and answered questions.  I personally spent a total of 50 minutes in the care of the patient today including getting/reviewing separately obtained history, performing a medically appropriate exam/evaluation, counseling and educating, placing orders, referring and communicating with other health care professionals, documenting clinical information in the EHR, independently interpreting results, and coordinating care.         Eather Popp, MD Medical Director [facility] Pager: [phone] 12/01/2023 2:48  PM  To contact Stroke Continuity provider, please refer to [email or web address]. After hours, contact General Neurology

## 2023-12-01 NOTE — TOC Initial Note (Signed)
 Transition of Care [facility]) - Initial/Assessment Note    Patient Details  Name: Allen Macdonald MRN: [id number] Date of Birth: 06-Dec-1950  Transition of [facility]) CM/SW Contact:    Inocente GORMAN Kindle, LCSW Phone Number: 12/01/2023, 3:11 PM  Clinical Narrative:                 Patient admitted from CIR. ICM following for insurance determination to return to CIR.   Expected Discharge Plan: IP Rehab Facility Barriers to Discharge: Continued Medical Work up, English as a second language teacher   Patient Goals and CMS Choice Patient states their goals for this hospitalization and ongoing recovery are:: Return to rehab          Expected Discharge Plan and Services     Post Acute Care Choice: IP Rehab Living arrangements for the past 2 months: Single Family Home                                      Prior Living Arrangements/Services Living arrangements for the past 2 months: Single Family Home Lives with:: Spouse Patient language and need for interpreter reviewed:: Yes Do you feel safe going back to the place where you live?: Yes      Need for Family Participation in Patient Care: Yes (Comment) Care giver support system in place?: Yes (comment)   Criminal Activity/Legal Involvement Pertinent to Current Situation/Hospitalization: No - Comment as needed  Activities of Daily Living      Permission Sought/Granted Permission sought to share information with : Facility Medical sales representative, Family Supports                Emotional Assessment Appearance:: Appears stated age Attitude/Demeanor/Rapport: Unable to Assess   Orientation: : Oriented to Self, Oriented to Place, Oriented to Situation Alcohol / Substance Use: Not Applicable Psych Involvement: No (comment)  Admission diagnosis:  Stroke (cerebrum) (HCC) [I63.9] Acute ischemic left MCA stroke (HCC) [I63.512] Stroke [facility]) [I63.9] Patient Active Problem List   Diagnosis Date Noted   Stroke (cerebrum) (HCC) 11/29/2023    Stroke (HCC) 11/29/2023   Orthostatic hypotension 11/25/2023   Hyperlipidemia 11/25/2023   AKI (acute kidney injury) (HCC) 11/25/2023   UTI (urinary tract infection) 11/25/2023   Fever in newborn 11/25/2023   Obesity 11/25/2023   Dysphagia 11/25/2023   Anemia 11/25/2023   CVA (cerebral vascular accident) (HCC) 11/25/2023   Acute ischemic left MCA stroke (HCC) 11/20/2023   Iron deficiency anemia due to chronic blood loss 08/03/2023   Acquired hallux rigidus, left 11/20/2020   Plantar fasciitis 11/20/2020   Strain of left foot 11/20/2020   Laryngeal cancer (HCC) squamous cell 02/25/2020   History of pulmonary embolus (PE)    PC (prostate cancer) (HCC) 08/28/2019   Abnormal stress test minimal ischemia involving apex 05/01/2019   History of DVT (deep vein thrombosis) 04/22/2017   Left knee injury 12/29/2015   Dilated cardiomyopathy (HCC) 12/18/2015   Essential hypertension 12/18/2015   History of pulmonary embolism 12/18/2015   PCP:  Ina Marcellus GORMAN, MD Pharmacy:   [address] - [place], [place] - [address] [place] [address] Phone: [phone] Fax: [phone]  [id number] - [place], [place] - [address] AT [facility] [address] [place] [address] Phone: [phone] Fax: [phone]  [facility] [address] [place] [address] Phone: [phone] Fax: [phone]     Social Drivers of Health (SDOH) Social History: SDOH Screenings   Food Insecurity: No Food Insecurity (11/30/2023)  Housing: Unknown (11/30/2023)  Transportation Needs: No Transportation Needs (11/30/2023)  Utilities: Not At Risk (11/30/2023)  Social Connections: Moderately Integrated (11/30/2023)  Tobacco Use: Low Risk  (11/28/2023)   SDOH Interventions:     Readmission Risk Interventions     No data to display

## 2023-12-01 NOTE — Progress Notes (Signed)
 Inpatient Rehab Admissions Coordinator:   Therapy recommending return to CIR.  I will need insurance approval to return and I have started that request this morning.   Reche Lowers, PT, DPT Admissions Coordinator [phone] 12/01/23  9:14 AM

## 2023-12-01 NOTE — PMR Pre-admission (Signed)
 PMR Admission Coordinator Pre-Admission Assessment   Patient: Allen Macdonald is an 73 y.o., male MRN: [id number] DOB: 1950/09/16 Height: 5' 10 (177.8 cm) Weight: 104.5 kg   Insurance Information HMO:     PPO: yes     PCP:      IPA:      80/20:      OTHER:  PRIMARY: [employer] Medicare HMO/PPO     Policy#: [id number]       Subscriber: pt CM Name: Annabella      Phone#: [phone]   Fax#: [phone] Pre-Cert#: [id number] auth 12/02/23 through 8/21 with updates due to fax listed above     Employer:  Benefits:  Phone #: [phone]     Name:  Eff. Date: 04/20/23     Deduct: $0      Out of Pocket Max: $4150 (met)      Life Max:  CIR: $300/day for days 1-6      SNF: $10/day Outpatient:      Co-Pay: $10/visit Home Health: 100%      Co-Pay:  DME: 80%     Co-Pay: 20% Providers:  SECONDARY:       Policy#:      Phone#:    Artist:       Phone#:    The Engineer, materials Information Summary" for patients in Inpatient Rehabilitation Facilities with attached "Privacy Act Statement-Health Care Records" was provided and verbally reviewed with: Patient and Family  Emergency Contact Information Contact Information     Name Relation Home Work Mobile   Central Spouse [phone]      Other Contacts   None on File    Current Medical History  Patient Admitting Diagnosis: L MCA CVA   History of Present Illness: Pt is a 73 y/o male with PMH of HTN, DVT/PE, bladder cancer s/p nephrostomy who was admitted to [facility] on 11/20/23 with R hemiparesis, L gaze preference, and aphasia.  Pt on eliquis  having recently switched from xarelto .  CT head showed no abnormality, CTA head/neck showed terminal L ICA occlusion with poor opacification of L MCA branches.  NIHSS was 24 on neurology consult.  He was not a candidate for TNK due to eliquis , but did proceed for thrombectomy with IR.  After working with PT on 8/4 pt had sudden change in status, and stat CTA/profusion scan  ordered which demonstrated L MCA dissection so was taken for urgent thrombectomy which did not result in any improvement in functional status.  Stroke workup revealed MRI confirmed L MCA strokes as well as R occipital lobe and R frontal white matter, age indeterminate DVTs in LEs (see report).  Hospital course complicated by ABLA requiring 2u of blood.  Eliquis  to be resumed once hgb stable.  He was transferred to CIR on 11/25/23 where he was progressing well.  ON 8/12 he developed sudden onset right facial droop, increased R hemiparesis, and aphasia.  Code stroke was activated and CTA head showed thrombus in the left ICA terminus and near occlusive thrombus of the basilar tip.  Pt was transferred emergently back to the acute setting for mechanical thrombectomy.  Post procedure he was started on IV heparin  as a bridge to coumadin .  Therapy evaluations were completed and he was recommended to return to CIR to complete the program.    Complete NIHSS TOTAL: 3  Patient's medical record from [facility] has been reviewed by the rehabilitation admission coordinator and physician.  Past Medical History  Past Medical History:  Diagnosis Date   Abnormal stress test minimal ischemia involving apex 05/01/2019   Bladder cancer (HCC)    Dilated cardiomyopathy (HCC)    Essential hypertension 12/18/2015   History of DVT (deep vein thrombosis) 04/22/2017   History of pulmonary embolism 12/18/2015   Laryngeal cancer (HCC) squamous cell 02/25/2020   PC (prostate cancer) (HCC) 08/28/2019   Has the patient had major surgery during 100 days prior to admission? Yes  Family History   family history includes Heart failure in his mother; Other in his father; Pleurisy in his mother.  Current Medications  Current Facility-Administered Medications:    acetaminophen  (TYLENOL ) tablet 650 mg, 650 mg, Oral, Q4H PRN **OR** acetaminophen  (TYLENOL ) 160 MG/5ML solution 650 mg, 650 mg, Per Tube, Q4H PRN **OR** acetaminophen   (TYLENOL ) suppository 650 mg, 650 mg, Rectal, Q4H PRN, Lindzen, Eric, MD   atorvastatin  (LIPITOR ) tablet 80 mg, 80 mg, Oral, Daily, Waddell Aquas A, NP, 80 mg at 12/02/23 0914   Chlorhexidine  Gluconate Cloth 2 % PADS 6 each, 6 each, Topical, Daily, Lindzen, Eric, MD, 6 each at 12/02/23 0916   heparin  ADULT infusion 100 units/mL (25000 units/250mL), 900 Units/hr, Intravenous, Continuous, Zakrajsek, Katelyn M, RPH, Last Rate: 9 mL/hr at 12/02/23 0429, 900 Units/hr at 12/02/23 0429   warfarin (COUMADIN ) tablet 7.5 mg, 7.5 mg, Oral, q1600, Lindzen, Eric, MD, 7.5 mg at 12/02/23 1612   Warfarin - Pharmacist Dosing Inpatient, , Does not apply, q1600, Dang, Thuy D, [facility], Given at 12/01/23 1639  Patients Current Diet:  Diet Order             Diet regular Room service appropriate? Yes; Fluid consistency: Thin  Diet effective now                  Precautions / Restrictions Precautions Precautions: Fall, Other (comment) Precaution/Restrictions Comments: bilat nephrostomy bags Restrictions Weight Bearing Restrictions Per Provider Order: No   Has the patient had 2 or more falls or a fall with injury in the past year? No  Prior Activity Level Community (5-7x/wk): independent, driving, no DME at baseline   Prior Functional Level Self Care: Did the patient need help bathing, dressing, using the toilet or eating? Independent   Indoor Mobility: Did the patient need assistance with walking from room to room (with or without device)? Independent   Stairs: Did the patient need assistance with internal or external stairs (with or without device)? Independent   Functional Cognition: Did the patient need help planning regular tasks such as shopping or remembering to take medications? Independent   Patient Information Are you of Hispanic, Latino/a,or Spanish origin?: A. No, not of Hispanic, Latino/a, or Spanish origin What is your race?: A. White Do you need or want an interpreter to communicate with  a doctor or health care staff?: 0. No   Patient's Response To:  Health Literacy and Transportation Is the patient able to respond to health literacy and transportation needs?: Yes Health Literacy - How often do you need to have someone help you when you read instructions, pamphlets, or other written material from your doctor or pharmacy?: Never In the past 12 months, has lack of transportation kept you from medical appointments or from getting medications?: No In the past 12 months, has lack of transportation kept you from meetings, work, or from getting things needed for daily living?: No   Home Assistive Devices / Equipment Home Equipment: [place] - single point, Toilet riser, Agricultural consultant (2 wheels), Rollator (4 wheels), Wheelchair - manual, [employer]  seat   Prior Device Use: Indicate devices/aids used by the patient prior to current illness, exacerbation or injury? None of the above  Current Functional Level Cognition  Overall Cognitive Status: No family/caregiver present to determine baseline cognitive functioning (pt has aphasia - evaluation focused on language) Orientation Level: Oriented to person, Oriented to place, Oriented to situation, Disoriented to time Attention: Sustained Sustained Attention: Appears intact Selective Attention: Appears intact Memory: Appears intact (recalled his wife and hospital admit for strokes) Awareness: Appears intact    Extremity Assessment (includes Sensation/Coordination)  Upper Extremity Assessment: Left hand dominant, RUE deficits/detail RUE Deficits / Details: shoulder AROM to 90, flexor tone noted. oppposition to thumb intact but slow. RUE Sensation: WNL RUE Coordination: decreased fine motor, decreased gross motor  Lower Extremity Assessment: Defer to PT evaluation RLE Deficits / Details: ROM WFL, grossly 4/5 but weaker compared to L    ADLs  Overall ADL's : Needs assistance/impaired Eating/Feeding: Set up, Bed level Eating/Feeding  Details (indicate cue type and reason): self feeding L hand Grooming: Set up, Sitting Upper Body Bathing: Minimal assistance, Sitting Lower Body Bathing: Maximal assistance, Moderate assistance, Sitting/lateral leans, Sit to/from stand Upper Body Dressing : Minimal assistance, Sitting Lower Body Dressing: Maximal assistance, Moderate assistance, Sit to/from stand, Sitting/lateral leans Toilet Transfer: Moderate assistance, Stand-pivot, BSC/3in1 Toilet Transfer Details (indicate cue type and reason): simulated Toileting- Clothing Manipulation and Hygiene: Total assistance Toileting - Clothing Manipulation Details (indicate cue type and reason): unaware of minimal BM Functional mobility during ADLs: Cueing for safety, Rolling walker (2 wheels), Minimal assistance General ADL Comments: focused session on RUE ROM/stretching, attention to R side. Created obstacles for pt to navigate with use of 4 cones for pt to perform zig zag pattern and avoid hitting obstacles on R side. reinforced scanning to R side during activity, pt bumped into 4 obstacles on R.    Mobility  Overal bed mobility: Needs Assistance Bed Mobility: Sit to Supine, Rolling, Sidelying to Sit Rolling: Min assist Sidelying to sit: Min assist, Used rails Supine to sit: Min assist, HOB elevated, Used rails Sit to supine: Min assist General bed mobility comments: able to bring LE's off EOB, cues to bring hand to rail with assist to raise trunk. Slight assist for LE's back into bed    Transfers  Overall transfer level: Needs assistance Equipment used: Rolling walker (2 wheels) Transfers: Sit to/from Stand Sit to Stand: Mod assist Step pivot transfers: Mod assist General transfer comment: ModA for boost-up and steadying. Repetitive cues for hand placement with increased difficulty placing R hand    Ambulation / Gait / Stairs / Wheelchair Mobility  Ambulation/Gait Ambulation/Gait assistance: Contact guard assist Gait Distance  (Feet): 80 Feet Assistive device: Rolling walker (2 wheels) Gait Pattern/deviations: Step-through pattern, Trunk flexed, Decreased stride length General Gait Details: able to manage RW with verbal cues. Minimal instability noted in R LE, however, no buckling or LOB. Gait velocity: decr    Posture / Balance Balance Overall balance assessment: Needs assistance Sitting-balance support: Feet supported, No upper extremity supported Sitting balance-Leahy Scale: Fair Standing balance support: Bilateral upper extremity supported, During functional activity, Reliant on assistive device for balance Standing balance-Leahy Scale: Poor Standing balance comment: with RW support High level balance activites: Side stepping, Backward walking High Level Balance Comments: Anterior step-over obstacle Bx5, Lateral step-over obstacle Bx10, Backwards walking x10 ft Timed Up and Go Test TUG: Normal TUG Normal TUG (seconds): 59.1    Special considerations/life events     Previous Home  Environment ( Living Arrangements: Spouse/significant other  Lives With: Spouse Available Help at Discharge: Family, Available 24 hours/day Type of Home: House Home Layout: One level Home Access: Stairs to enter Entrance Stairs-Rails: Can reach both Entrance Stairs-Number of Steps: 3 Bathroom Toilet: Standard Bathroom Accessibility: Yes  Discharge Living Setting Plans for Discharge Living Setting: Patient's home, Lives with (comment) (spouse) Type of Home at Discharge: House Discharge Home Layout: One level Discharge Home Access: Stairs to enter Entrance Stairs-Rails: Can reach both Entrance Stairs-Number of Steps: 3 Discharge Bathroom Shower/Tub: Walk-in shower, Tub/shower unit Discharge Bathroom Toilet: Handicapped height Discharge Bathroom Accessibility: Yes How Accessible: Accessible via walker Does the patient have any problems obtaining your medications?: No   Social/Family/Support Systems Patient Roles:  Spouse Anticipated Caregiver: [name] (spouse) Anticipated Caregiver's Contact Information: [phone] Ability/Limitations of Caregiver: per chart, [name] is legally blind; however she is able to see, just not well enough to drive. Caregiver Availability: 24/7 Discharge Plan Discussed with Primary Caregiver: Yes Is Caregiver In Agreement with Plan?: Yes Does Caregiver/Family have Issues with Lodging/Transportation while Pt is in Rehab?: No   Goals Patient/Family Goal for Rehab: PT/OT supervision to min assist; SLP supervision Expected length of stay: 12-16 days Additional Information: Discharge plan: expect discharge back to pt's home with spouse who can provide 24/7 supervision/min assist Pt/Family Agrees to Admission and willing to participate: Yes Program Orientation Provided & Reviewed with Pt/Caregiver Including Roles  & Responsibilities: Yes   Decrease burden of Care through IP rehab admission: no   Possible need for SNF placement upon discharge: Not anticipated.  Discharge plan: home with spouse who can provide 24/7 supervision to min assist.   Patient Condition: I have reviewed medical records from [facility], spoken with CM, and patient and spouse. I discussed via phone for inpatient rehabilitation assessment.  Patient will benefit from ongoing PT, OT, and SLP, can actively participate in 3 hours of therapy a day 5 days of the week, and can make measurable gains during the admission.  Patient will also benefit from the coordinated team approach during an Inpatient Acute Rehabilitation admission.  The patient will receive intensive therapy as well as Rehabilitation physician, nursing, social worker, and care management interventions.  Due to safety, skin/wound care, disease management, medication administration, pain management, and patient education the patient requires 24 hour a day rehabilitation nursing.  The patient is currently min to mod assist with mobility and basic ADLs.   Discharge setting and therapy post discharge at home with home health is anticipated.  Patient has agreed to participate in the Acute Inpatient Rehabilitation Program and will admit 12/03/23.  Preadmission Screen Completed By:  Reche FORBES Lowers, 12/02/2023 5:08 PM ______________________________________________________________________   Discussed status with Dr. Lorilee on 12/02/23 at 1700 and received approval for admission 8/16 when bed is available.  Admission Coordinator:  Caitlin E Warren, PT, time 1700 Date 12/02/23   Assessment/Plan: Diagnosis: CVA Does the need for close, 24 hr/day Medical supervision in concert with the patient's rehab needs make it unreasonable for this patient to be served in a less intensive setting? Yes Co-Morbidities requiring supervision/potential complications: HTN, DVT/PE, bladder cancer s/p nephrostomy Due to bladder management, bowel management, safety, skin/wound care, disease management, medication administration, pain management, and patient education, does the patient require 24 hr/day rehab nursing? Yes Does the patient require coordinated care of a physician, rehab nurse, PT, OT, and SLP to address physical and functional deficits in the context of the above medical diagnosis(es)? Yes Addressing deficits in the  following areas: balance, endurance, locomotion, strength, transferring, bowel/bladder control, bathing, dressing, feeding, grooming, toileting, language, and psychosocial support Can the patient actively participate in an intensive therapy program of at least 3 hrs of therapy 5 days a week? Yes The potential for patient to make measurable gains while on inpatient rehab is excellent Anticipated functional outcomes upon discharge from inpatient rehab: supervision PT, supervision OT, supervision SLP Estimated rehab length of stay to reach the above functional goals is: 8-12 days Anticipated discharge destination: Home 10. Overall Rehab/Functional  Prognosis: excellent   MD Signature: Sven Elks, MD

## 2023-12-01 NOTE — Progress Notes (Signed)
 Report given to RN on 3W, patient to be transferred to 3w06

## 2023-12-01 NOTE — Progress Notes (Signed)
 PHARMACY - ANTICOAGULATION CONSULT NOTE  Pharmacy Consult:  Heparin  Indication: History of VTE with recurrent CVAs  Allergies  Allergen Reactions   Bee Venom Anaphylaxis    Patient Measurements: Height: 5' 10 (177.8 cm) Weight: 101.8 kg (224 lb 6.9 oz) IBW/kg (Calculated) : 73 HEPARIN  DW (KG): 94.4  Vital Signs: Temp: 98 F (36.7 C) (08/14 0400) Temp Source: Oral (08/14 0400) BP: 117/56 (08/14 0800) Pulse Rate: 76 (08/14 0800)  Labs: Recent Labs    11/29/23 2018 11/30/23 0537 11/30/23 0924 11/30/23 2026 12/01/23 0626  HGB 9.0* 8.5*  --   --  8.5*  HCT 28.0* 27.7*  --   --  27.1*  PLT 119* 100*  --   --  111*  APTT  --   --   --  98* 86*  LABPROT  --   --  16.1*  --  15.8*  INR  --   --  1.2  --  1.2  HEPARINUNFRC  --   --   --  >1.10* 0.87*  CREATININE 1.10 1.16  --   --   --     Estimated Creatinine Clearance: 65.8 mL/min (by C-G formula based on SCr of 1.16 mg/dL).  Assessment: 52 YOM with 2 recent L MCA strokes s/p MT each time.  Patient went to Rehab and was receiving Eliquis , last dose on 8/12 at 0847.  Patient returned to the inpatient side with recurrent CVA on 11/29/23.  MRI shows petechial hemorrhage with new/increased scattered punctate infarcts and new microhemorrhages.  Pharmacy consulted to dose IV heparin  bridge to Coumadin .  Stopping Eliquis .  Hemoglobin/hematocrit now stable, platelet count improving.  aPTT slightly above goal at 86 sec and INR remains unchanged at 1.2.  Patient is eating well; no bleeding reported.  Goal of Therapy:  INR 2-3 Heparin  level 0.3-0.5 units/ml aPTT 66-85 seconds Monitor platelets by anticoagulation protocol: Yes   Plan:  Reduce heparin  infusion to 800 units/hr Check 8 hr aPTT Coumadin  7.5mg  PO today Daily heparin  level, aPTT, PT / INR and CBC  Lasheika Ortloff D. Lendell, PharmD, BCPS, BCCCP 12/01/2023, 9:02 AM

## 2023-12-01 NOTE — Plan of Care (Signed)
  Problem: Education: Goal: Knowledge of disease or condition will improve Outcome: Progressing Goal: Knowledge of secondary prevention will improve (MUST DOCUMENT ALL) Outcome: Progressing Goal: Knowledge of patient specific risk factors will improve (DELETE if not current risk factor) Outcome: Progressing   Problem: Ischemic Stroke/TIA Tissue Perfusion: Goal: Complications of ischemic stroke/TIA will be minimized Outcome: Progressing   Problem: Coping: Goal: Will verbalize positive feelings about self Outcome: Progressing Goal: Will identify appropriate support needs Outcome: Progressing   Problem: Health Behavior/Discharge Planning: Goal: Ability to manage health-related needs will improve Outcome: Progressing Goal: Goals will be collaboratively established with patient/family Outcome: Progressing   Problem: Self-Care: Goal: Ability to participate in self-care as condition permits will improve Outcome: Progressing Goal: Verbalization of feelings and concerns over difficulty with self-care will improve Outcome: Progressing Goal: Ability to communicate needs accurately will improve Outcome: Progressing   Problem: Nutrition: Goal: Risk of aspiration will decrease Outcome: Progressing Goal: Dietary intake will improve Outcome: Progressing   Problem: Education: Goal: Knowledge of General Education information will improve Description: Including pain rating scale, medication(s)/side effects and non-pharmacologic comfort measures Outcome: Progressing   Problem: Health Behavior/Discharge Planning: Goal: Ability to manage health-related needs will improve Outcome: Progressing   Problem: Clinical Measurements: Goal: Ability to maintain clinical measurements within normal limits will improve Outcome: Progressing Goal: Will remain free from infection Outcome: Progressing Goal: Diagnostic test results will improve Outcome: Progressing Goal: Respiratory complications will  improve Outcome: Progressing Goal: Cardiovascular complication will be avoided Outcome: Progressing   Problem: Activity: Goal: Risk for activity intolerance will decrease Outcome: Progressing   Problem: Nutrition: Goal: Adequate nutrition will be maintained Outcome: Progressing   Problem: Coping: Goal: Level of anxiety will decrease Outcome: Progressing   Problem: Elimination: Goal: Will not experience complications related to bowel motility Outcome: Progressing Goal: Will not experience complications related to urinary retention Outcome: Progressing   Problem: Pain Managment: Goal: General experience of comfort will improve and/or be controlled Outcome: Progressing   Problem: Safety: Goal: Ability to remain free from injury will improve Outcome: Progressing   Problem: Skin Integrity: Goal: Risk for impaired skin integrity will decrease Outcome: Progressing   Problem: Education: Goal: Understanding of CV disease, CV risk reduction, and recovery process will improve Outcome: Progressing Goal: Individualized Educational Video(s) Outcome: Progressing   Problem: Activity: Goal: Ability to return to baseline activity level will improve Outcome: Progressing   Problem: Cardiovascular: Goal: Ability to achieve and maintain adequate cardiovascular perfusion will improve Outcome: Progressing Goal: Vascular access site(s) Level 0-1 will be maintained Outcome: Progressing   Problem: Health Behavior/Discharge Planning: Goal: Ability to safely manage health-related needs after discharge will improve Outcome: Progressing

## 2023-12-01 NOTE — Progress Notes (Signed)
 Physical Therapy Treatment Patient Details Name: Allen Macdonald MRN: [id number] DOB: 1950/10/12 Today's Date: 12/01/2023   History of Present Illness 73 y.o. male who developed sudden onset of right facial droop, increased right-sided weakness and aphasia on 8/12 at [facility].  CT revealed a thrombus in left ICA terminus with occlusion of left M1 and a near occlusive thrombus of basilar tip.  Patient was seen for mechanical thrombectomy. PMH: [facility] in [facility] with R weakness, L gaze and expressive aphasia. CTA with L ICA occlusion, pt underwent thrombectomy on 8/3. Pt with neuro change on 8/4, found to have L M1 occlusion and underwent subsequent thrombectomy and found to have L MCA dissection,  HTN, DVT/PE, bladder CA s/p nephrostomy.    PT Comments  Pt reports no pain this date, word finding difficulties present and worse with fatigue. Pt ambulatory for short hallway distance with light physical assist for steadying, pt with R inattention noted in R hand sliding forward/backward on RW without awareness. Pt with min RLE instability with fatigue, but no overt buckling noted. Plan remains appropriate, will continue to follow.      If plan is discharge home, recommend the following: A little help with walking and/or transfers;A lot of help with bathing/dressing/bathroom;Assistance with cooking/housework;Direct supervision/assist for medications management;Direct supervision/assist for financial management;Assist for transportation;Help with stairs or ramp for entrance;Supervision due to cognitive status   Can travel by private vehicle        Equipment Recommendations  Rolling walker (2 wheels);BSC/3in1    Recommendations for Other Services       Precautions / Restrictions Precautions Precautions: Fall;Other (comment) Recall of Precautions/Restrictions: Intact Precaution/Restrictions Comments: bilat nephrostomy bags Restrictions Weight Bearing Restrictions Per Provider Order: No      Mobility  Bed Mobility Overal bed mobility: Needs Assistance Bed Mobility: Supine to Sit     Supine to sit: Min assist, HOB elevated, Used rails     General bed mobility comments: assist for trunk elevation with use of HOB elevation.    Transfers Overall transfer level: Needs assistance Equipment used: Rolling walker (2 wheels) Transfers: Sit to/from Stand Sit to Stand: Min assist           General transfer comment: assist for rise and steady, cues for correct hand placement on RW esp R hand    Ambulation/Gait Ambulation/Gait assistance: Min assist Gait Distance (Feet): 75 Feet Assistive device: Rolling walker (2 wheels) Gait Pattern/deviations: Step-through pattern, Trunk flexed, Decreased stride length Gait velocity: decr     General Gait Details: assist to steady and guide RW, min R knee instability no buckling noted with fatigue as well as R hip drop   Stairs             Wheelchair Mobility     Tilt Bed    Modified Rankin (Stroke Patients Only)       Balance Overall balance assessment: Needs assistance Sitting-balance support: Feet supported, No upper extremity supported Sitting balance-Leahy Scale: Fair     Standing balance support: Bilateral upper extremity supported, During functional activity, Reliant on assistive device for balance Standing balance-Leahy Scale: Poor Standing balance comment: with RW support                            Communication Communication Communication: Impaired Factors Affecting Communication: Difficulty expressing self  Cognition Arousal: Alert Behavior During Therapy: Flat affect   PT - Cognitive impairments: Awareness, Attention, Problem solving, Sequencing  PT - Cognition Comments: expressive difficulty and pt frustrated by this. Responds yes/no throughout session seemingly appropriately, but when confirmed with wife pt was inconsistent in responses (i.e. states  yes he has had his nephrostomies >1 year but per wife has had them since March) Following commands: Impaired Following commands impaired: Follows multi-step commands inconsistently, Only follows one step commands consistently, Follows one step commands with increased time    Cueing Cueing Techniques: Verbal cues, Gestural cues, Tactile cues  Exercises General Exercises - Lower Extremity Long Arc Quad: AROM, Both, 15 reps, Seated Hip Flexion/Marching: AROM, Both, 10 reps, Seated    General Comments General comments (skin integrity, edema, etc.): BP stable throughout session with no reports of dizziness/lightheadedness - 124/63 supine, 122/99 standing, 138/68 post-gait      Pertinent Vitals/Pain Pain Assessment Pain Assessment: No/denies pain    Home Living                          Prior Function            PT Goals (current goals can now be found in the care plan section) Acute Rehab PT Goals Patient Stated Goal: to return to independence PT Goal Formulation: With patient Time For Goal Achievement: 12/14/23 Potential to Achieve Goals: Fair Progress towards PT goals: Progressing toward goals    Frequency    Min 3X/week      PT Plan      Co-evaluation              AM-PAC PT 6 Clicks Mobility   Outcome Measure  Help needed turning from your back to your side while in a flat bed without using bedrails?: A Little Help needed moving from lying on your back to sitting on the side of a flat bed without using bedrails?: A Little Help needed moving to and from a bed to a chair (including a wheelchair)?: A Little Help needed standing up from a chair using your arms (e.g., wheelchair or bedside chair)?: A Little Help needed to walk in hospital room?: A Little Help needed climbing 3-5 steps with a railing? : Total 6 Click Score: 16    End of Session Equipment Utilized During Treatment: Gait belt Activity Tolerance: Patient tolerated treatment  well Patient left: in chair;with chair alarm set;with call bell/phone within reach Nurse Communication: Mobility status PT Visit Diagnosis: Hemiplegia and hemiparesis Hemiplegia - Right/Left: Left Hemiplegia - dominant/non-dominant: Dominant Hemiplegia - caused by: Cerebral infarction     Time: 9081-9054 PT Time Calculation (min) (ACUTE ONLY): 27 min  Charges:    $Gait Training: 8-22 mins $Therapeutic Activity: 8-22 mins PT General Charges $$ ACUTE PT VISIT: 1 Visit                    Johana RAMAN, PT DPT Acute Rehabilitation Services Secure Chat Preferred  Office [phone]    Anasofia Micallef FORBES Kingdom 12/01/2023, 10:48 AM

## 2023-12-02 DIAGNOSIS — R29705 NIHSS score 5: Secondary | ICD-10-CM | POA: Diagnosis not present

## 2023-12-02 DIAGNOSIS — I63412 Cerebral infarction due to embolism of left middle cerebral artery: Secondary | ICD-10-CM | POA: Diagnosis not present

## 2023-12-02 DIAGNOSIS — C679 Malignant neoplasm of bladder, unspecified: Secondary | ICD-10-CM | POA: Diagnosis not present

## 2023-12-02 DIAGNOSIS — I63543 Cerebral infarction due to unspecified occlusion or stenosis of bilateral cerebellar arteries: Secondary | ICD-10-CM | POA: Diagnosis not present

## 2023-12-02 LAB — CBC
HCT: 24.1 % — ABNORMAL LOW (ref 39.0–52.0)
Hemoglobin: 7.7 g/dL — ABNORMAL LOW (ref 13.0–17.0)
MCH: 29.8 pg (ref 26.0–34.0)
MCHC: 32 g/dL (ref 30.0–36.0)
MCV: 93.4 fL (ref 80.0–100.0)
Platelets: 113 K/uL — ABNORMAL LOW (ref 150–400)
RBC: 2.58 MIL/uL — ABNORMAL LOW (ref 4.22–5.81)
RDW: 18.4 % — ABNORMAL HIGH (ref 11.5–15.5)
WBC: 6 K/uL (ref 4.0–10.5)
nRBC: 0.3 % — ABNORMAL HIGH (ref 0.0–0.2)

## 2023-12-02 LAB — PROTIME-INR
INR: 1.3 — ABNORMAL HIGH (ref 0.8–1.2)
Prothrombin Time: 16.4 s — ABNORMAL HIGH (ref 11.4–15.2)

## 2023-12-02 LAB — HEPARIN LEVEL (UNFRACTIONATED): Heparin Unfractionated: 0.44 [IU]/mL (ref 0.30–0.70)

## 2023-12-02 LAB — APTT: aPTT: 89 s — ABNORMAL HIGH (ref 24–36)

## 2023-12-02 MED ORDER — WARFARIN SODIUM 7.5 MG PO TABS: 7.5000 mg | ORAL_TABLET | Freq: Once | ORAL | Status: DC

## 2023-12-02 MED ORDER — WARFARIN SODIUM 7.5 MG PO TABS
7.5000 mg | ORAL_TABLET | Freq: Every day | ORAL | Status: DC
Start: 2023-12-02 — End: 2023-12-03
  Administered 2023-12-02: 7.5 mg via ORAL
  Filled 2023-12-02: qty 1

## 2023-12-02 NOTE — Progress Notes (Addendum)
 STROKE TEAM PROGRESS NOTE    SIGNIFICANT HOSPITAL EVENTS 8/12 patient developed sudden onset of right facial droop, increased right-sided weakness and aphasia.  CT a with thrombus in left ICA terminus with occlusion of left M1 and a near occlusive thrombus of basilar tip.  Patient was seen for mechanical thrombectomy  INTERIM HISTORY/SUBJECTIVE Wife is at the bedside.  Patient is sitting in the bed in no apparent distress.  No new neurological events overnight. Patient remains on IV heparin  and warfarin has been started but INR is yet suboptimal at 1.3. Neurological exam remains stable and unchanged. He is medically stable for rehab when bed is available   CBC    Component Value Date/Time   WBC 6.0 12/02/2023 0350   RBC 2.58 (L) 12/02/2023 0350   HGB 7.7 (L) 12/02/2023 0350   HGB 9.0 (L) 07/15/2023 1309   HGB 13.5 04/22/2017 1023   HCT 24.1 (L) 12/02/2023 0350   HCT 39.2 04/22/2017 1023   PLT 113 (L) 12/02/2023 0350   PLT 170 07/15/2023 1309   PLT 164 04/22/2017 1023   MCV 93.4 12/02/2023 0350   MCV 95 04/22/2017 1023   MCH 29.8 12/02/2023 0350   MCHC 32.0 12/02/2023 0350   RDW 18.4 (H) 12/02/2023 0350   RDW 14.0 04/22/2017 1023   LYMPHSABS 1.3 11/28/2023 0339   MONOABS 0.5 11/28/2023 0339   EOSABS 0.2 11/28/2023 0339   BASOSABS 0.1 11/28/2023 0339    BMET    Component Value Date/Time   NA 139 11/30/2023 0537   NA 139 07/05/2019 1045   K 3.9 11/30/2023 0537   CL 109 11/30/2023 0537   CO2 23 11/30/2023 0537   GLUCOSE 90 11/30/2023 0537   BUN 20 11/30/2023 0537   BUN 11 07/05/2019 1045   CREATININE 1.16 11/30/2023 0537   CALCIUM  8.3 (L) 11/30/2023 0537   GFRNONAA >60 11/30/2023 0537    IMAGING past 24 hours No results found.   Vitals:   12/02/23 0005 12/02/23 0359 12/02/23 0728 12/02/23 1150  BP: 133/61 120/60 114/70 (!) 114/56  Pulse: 75 78 72 77  Resp: 16 18 16 17   Temp: 98 F (36.7 C) 98.1 F (36.7 C) 98 F (36.7 C) 97.6 F (36.4 C)  TempSrc: Oral  Oral Oral Oral  SpO2: 100% 96% 99% 99%  Weight:      Height:         PHYSICAL EXAM General:  Alert, well-nourished, well-developed patient in no acute distress Psych:  Mood and affect appropriate for situation CV: Regular rate and rhythm on monitor Respiratory:  Regular, unlabored respirations on room air GI: Abdomen soft and nontender   NEURO:  Mental Status: Awake and alert able to state his name, and age stated wrong place.  He does have expressive and receptive aphasia with some dysarthria.  Able to name some items   Cranial Nerves:  II: PERRL. Visual fields full.  III, IV, VI: EOMI. Eyelids elevate symmetrically.  V: Sensation is intact to light touch and symmetrical to face.  VII: Right facial droop VIII: hearing intact to voice. IX, X: Palate elevates symmetrically. Phonation is normal.  KP:Dynloizm shrug 5/5. XII: tongue is midline without fasciculations. Motor: 5/5 strength in left arm and bilateral legs, right arm 4/5 with drift Tone: is normal and bulk is normal Sensation- Intact to light touch bilaterally. Extinction absent to light touch to DSS.   Coordination: Mild ataxia on right, decreased fine motor skills on right Gait- deferred  Most Recent NIH  1a Level of Conscious.:  1b LOC Questions: 1 1c LOC Commands:  2 Best Gaze:  3 Visual:  4 Facial Palsy:  5a Motor Arm - left: 1 5b Motor Arm - Right:  6a Motor Leg - Left:  6b Motor Leg - Right:  7 Limb Ataxia: 1 8 Sensory:  9 Best Language: 1 10 Dysarthria: 1 11 Extinct. and Inatten.:  TOTAL: 5   ASSESSMENT/PLAN  [name] is a 73 y.o. male with history of  2 recent left MCA strokes status post mechanical thrombectomies of both, hypertension, DVT, PE on Eliquis  and bladder cancer status post bilateral nephrostomies who was in rehabilitation after his previous strokes and developed today sudden onset of right facial droop, increased right-sided weakness and expressive and receptive aphasia    NIH on Admission 10  Acute Ischemic Infarct:  right  punctate infarcts in the right occipital and bilateral cerebellar hemispheres. s/p mechanical thrombectomy of left carotid T occlusion and basilar tip occlusion, with TICI 3 revascularization of both occlusions with mechanical thrombectomy Etiology:  Likely due to hypercoagulable state in the setting of advanced cancer with failure of Eliquis  Code Stroke CT head No acute abnormality. ASPECTS 10. Evolving subacute left MCA infarcts    CTA head & neck New/recurrent thrombus in the left ICA terminus with occlusion of the left M1 origin. Reconstitution of the distal left M1 segment, however there are multiple occluded mid M2 and more distal branches. New severe stenosis or near occlusive thrombus at the basilar tip involving the origins of both PCAs.  Increased, severe stenosis of the distal V4 segment of the non-dominant right vertebral artery. MRI  Scattered Left MCA territory infarcts, New/increased scattered and generally punctate infarcts in the right occipital and bilateral cerebellar hemispheres. Several new micro-hemorrhages in those areas also. 2D Echo EF 50-55%, bubble study negative US  LE positive for age indeterminate DVTs: RT - PopV and PTV LT - CFV, PFV, FV, PopV, Gastrocs  TCD bubble study no DVT LDL 112 HgbA1c 5.7 VTE prophylaxis - IV heparin   Eliquis  prior to admission, now on IV heparin  with plan to transition to Coumadin   Therapy recommendations:  CIR Disposition: Pending  Hx of Stroke/TIA August 2025: left MCA scattered infarcts with left tICA and M1 occlusion s/p IR with TICI2c  Recurrent stroke: extension of left MCA scattered infarcts with left M1 re-occlusion s/p IR with TICI 2b due to dissection and hypotension.  Discharged to rehab on Eliquis   History of DVT/PE Hx of multiple b/l DVT and PE Had IVC filter placed ~ 2014 Likely related to hypercoagulable state from advanced malignancy Home Meds: Eliquis  2.5 bid US  LE  positive for age indeterminate DVTs: RT - PopV and PTV LT - CFV, PFV, FV, PopV, Gastrocs   Hypertension Home meds:  Amlodipine  5 mg, carvedilol  12.5 mg  Stable Blood Pressure Goal: SBP 120-160 for first 24 hours then less than 180   Hyperlipidemia Home meds: Atorvastatin  40mg ,  resumed in hospital LDL 112, goal < 70  Continue statin at discharge  Bladder cancer s/p bilateral nephrostomy tubes  Poorly differentiated bladder cancer, now stage IV, metastatic to the bone by MRI of the spine on 11/08/2023.   Dysphagia Patient has post-stroke dysphagia, SLP consulted    Diet   Diet regular Room service appropriate? Yes; Fluid consistency: Thin   Advance diet as tolerated  Anemia  Hgb 8.5 monitor   Other acute medical issues Hx of prostate cancer, on ADT H/o laryngeal cancer in 2021. S/p radiation  therapy.   Hospital day # 3  Karna Geralds DNP, ACNPC-AG  [facility] I have personally obtained history,examined this patient, reviewed notes, independently viewed imaging studies, participated in medical decision making and plan of care.ROS completed by me personally and pertinent positives fully documented  I have made any additions or clarifications directly to the above note. Agree with note above.  Patient is doing well.  Continue IV heparin  and warfarin until INR is about 2.  Patient has insurance approval to go to inpatient rehab but no bed today and will likely go tomorrow.  Long discussion with patient and wife and answered questions.  Discussed with rehab team.  Eather Popp, MD Medical Director [facility] Pager: [phone] 12/02/2023 2:14 PM  To contact Stroke Continuity provider, please refer to [email or web address]. After hours, contact General Neurology

## 2023-12-02 NOTE — Evaluation (Signed)
 Speech Language Pathology Evaluation Patient Details Name: Allen Macdonald MRN: [id number] DOB: 04-13-1951 Today's Date: 12/02/2023 Time: 0930-1010 SLP Time Calculation (min) (ACUTE ONLY): 40 min  Problem List:  Patient Active Problem List   Diagnosis Date Noted   Stroke (cerebrum) (HCC) 11/29/2023   Stroke (HCC) 11/29/2023   Orthostatic hypotension 11/25/2023   Hyperlipidemia 11/25/2023   AKI (acute kidney injury) (HCC) 11/25/2023   UTI (urinary tract infection) 11/25/2023   Fever in newborn 11/25/2023   Obesity 11/25/2023   Dysphagia 11/25/2023   Anemia 11/25/2023   CVA (cerebral vascular accident) (HCC) 11/25/2023   Acute ischemic left MCA stroke (HCC) 11/20/2023   Iron deficiency anemia due to chronic blood loss 08/03/2023   Acquired hallux rigidus, left 11/20/2020   Plantar fasciitis 11/20/2020   Strain of left foot 11/20/2020   Laryngeal cancer (HCC) squamous cell 02/25/2020   History of pulmonary embolus (PE)    PC (prostate cancer) (HCC) 08/28/2019   Abnormal stress test minimal ischemia involving apex 05/01/2019   History of DVT (deep vein thrombosis) 04/22/2017   Left knee injury 12/29/2015   Dilated cardiomyopathy (HCC) 12/18/2015   Essential hypertension 12/18/2015   History of pulmonary embolism 12/18/2015   Past Medical History:  Past Medical History:  Diagnosis Date   Abnormal stress test minimal ischemia involving apex 05/01/2019   Bladder cancer (HCC)    Dilated cardiomyopathy (HCC)    Essential hypertension 12/18/2015   History of DVT (deep vein thrombosis) 04/22/2017   History of pulmonary embolism 12/18/2015   Laryngeal cancer (HCC) squamous cell 02/25/2020   PC (prostate cancer) (HCC) 08/28/2019   Past Surgical History:  Past Surgical History:  Procedure Laterality Date   CATARACT EXTRACTION Bilateral    INSERTION PROSTATE RADIATION SEED  2003   IR CT HEAD LTD  11/20/2023   IR PERCUTANEOUS ART THROMBECTOMY/INFUSION INTRACRANIAL INC DIAG ANGIO   11/20/2023   IR PERCUTANEOUS ART THROMBECTOMY/INFUSION INTRACRANIAL INC DIAG ANGIO  11/21/2023   IR US  GUIDE VASC ACCESS RIGHT  11/20/2023   IR US  GUIDE VASC ACCESS RIGHT  11/21/2023   PROSTATE SURGERY     RADIOLOGY WITH ANESTHESIA N/A 11/20/2023   Procedure: RADIOLOGY WITH ANESTHESIA;  Surgeon: Radiologist, Medication, MD;  Location: [facility] OR;  Service: Radiology;  Laterality: N/A;   RADIOLOGY WITH ANESTHESIA N/A 11/21/2023   Procedure: RADIOLOGY WITH ANESTHESIA;  Surgeon: Radiologist, Medication, MD;  Location: [facility] OR;  Service: Radiology;  Laterality: N/A;   RADIOLOGY WITH ANESTHESIA N/A 11/29/2023   Procedure: RADIOLOGY WITH ANESTHESIA;  Surgeon: Radiologist, Medication, MD;  Location: [facility] OR;  Service: Radiology;  Laterality: N/A;   TONSILLECTOMY     Venacava Filter     WISDOM TOOTH EXTRACTION     HPI:  73 y.o. male presents to [facility] hospital on 11/20/2023 with R weakness, L gaze and expressive aphasia. CTA with L ICA occlusion, pt underwent thrombectomy on 8/3. Pt with neuro change on 8/4, found to have L M1 occlusion and underwent subsequent thrombectomy.   He was transferred to AIR and then transferred down to 3W due to increased right side weakness, facial droop, aphasia.  MRI 8/13 showed scattered infarcts, petechical  hemorrhage,  New/increased scattered and generally punctate infarcts in the  right occipital and bilateral cerebellar hemispheres. Several new micro-hemorrhages in those areas also.  PMH includes laryngeal cancer 2021 with 30 Rx treatments, HTN, DVT/PE, stage IV bladder CA s/p nephrostomy.   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   Assessment / Plan / Recommendation Clinical  Impression  Western Aphasia Battery *portion administered*.  With scoring as follows:  fluency 7/10; yes/no 7/10, sequential commands 2/10, repetition 8/10, object naming 7/10.  Formal testing ceased at that time.  [name] presents with ongoing non-fluent primary expressive aphasia with a milder component of  auditory comprehension deficit.  His verbal output mean was longer during today's evaluation, most notably with familiar listeners and topics.  Naming to confrontation: 70% accuracy; responsive naming: 60% accuracy.  Pt notably demonstrated perseverations x2 during session.  Word finding was difficult for pt which then resulted in paraphasias and pt sighing and abandoning communication.  Use of phrase completion, phonemic and artic cue as well as written choice of 3 improved his verbal output.  Provided pt with communication for emergent needs when he is frustrated and may not be able to express himself - with his use requiring mod I for use of picture cues and mod cues including decreasing field of choices with sentence length on the board.  Y/N accuracy for basic was 90% and more complex was 80%.  Repetition is intact for simple sentences but not complex.  Following directions for single was WNL but 2 step was difficulty. Discussed results and compensation strategies with pt and his wife using teach back. Both reported understanding and appreciation. Pt will benefit from continued intensive aphasia therapy at next level of care. Pt and wife report his communication is essentially basically the same as prior to this new event.    SLP Assessment  SLP Recommendation/Assessment: Patient needs continued Speech Language Pathology Services SLP Visit Diagnosis: Aphasia (R47.01)     Assistance Recommended at Discharge  Frequent or constant Supervision/Assistance  Functional Status Assessment Patient has had a recent decline in their functional status and demonstrates the ability to make significant improvements in function in a reasonable and predictable amount of time.  Frequency and Duration min 2x/week  2 weeks      SLP Evaluation Cognition  Overall Cognitive Status: No family/caregiver present to determine baseline cognitive functioning (pt has aphasia - evaluation focused on language) Orientation  Level: Oriented to person;Oriented to place;Oriented to situation;Disoriented to time Attention: Sustained Sustained Attention: Appears intact Selective Attention: Appears intact Memory: Appears intact (recalled his wife and hospital admit for strokes) Awareness: Appears intact       Comprehension  Auditory Comprehension Overall Auditory Comprehension: Impaired Yes/No Questions: Impaired Basic Biographical Questions: 76-100% accurate Basic Immediate Environment Questions: 75-100% accurate Complex Questions: 75-100% accurate Commands: Impaired One Step Basic Commands: 75-100% accurate Two Step Basic Commands: 50-74% accurate Multistep Basic Commands: 25-49% accurate Conversation: Simple Interfering Components: Motor planning EffectiveTechniques: Extra processing time;Repetition;Stressing words Visual Recognition/Discrimination Discrimination: Not tested Reading Comprehension Reading Status: Impaired Word level: Within functional limits Sentence Level: Impaired Interfering Components: Visual scanning (often skipping first words of sentence and reading at rapid rate) Effective Techniques: Eye glasses;Large print (slowing rate, index finger for locating beginning of sentences and differentiating from several small sentences)    Expression Expression Primary Mode of Expression: Verbal Verbal Expression Overall Verbal Expression: Impaired Initiation: No impairment Automatic Speech: Name;Social Response (DNT counting, DOW, months, singing, etc) Level of Generative/Spontaneous Verbalization: Sentence Repetition: Impaired Level of Impairment: Sentence level (increased word complexity as well as sentence/phrase) Naming: Impairment Responsive: 76-100% accurate Confrontation: Impaired Convergent: 50-74% accurate Divergent: Not tested Verbal Errors: Phonemic paraphasias;Semantic paraphasias;Perseveration;Aware of errors Pragmatics: No impairment Effective Techniques: Sentence  completion;Phonemic cues;Written cues;Articulatory cues Non-Verbal Means of Communication: Not applicable Written Expression Dominant Hand: Left Written Expression: Exceptions to [facility]  Self Formulation Ability: Word (pt wrote his name independently)   Oral / Motor  Oral Motor/Sensory Function Overall Oral Motor/Sensory Function: Mild impairment Facial ROM: Reduced right;Suspected CN VII (facial) dysfunction Facial Symmetry: Abnormal symmetry right;Suspected CN VII (facial) dysfunction Facial Sensation: Reduced right;Suspected CN V (Trigeminal) dysfunction Lingual ROM: Within Functional Limits Lingual Symmetry: Within Functional Limits Lingual Strength: Within Functional Limits Lingual Sensation: Other (Comment) (DNT) Velum: Within Functional Limits Mandible: Within Functional Limits Motor Speech Overall Motor Speech: Impaired Respiration: Within functional limits Phonation: Normal Resonance: Within functional limits Articulation: Impaired Level of Impairment: Word Intelligibility: Intelligible Motor Planning: Impaired Level of Impairment: Word Motor Speech Errors: Aware;Inconsistent Effective Techniques: Pause            Nicolas Emmie Caldron 12/02/2023, 10:36 AM  Madelin POUR, MS [facility] SLP Acute Rehab Services Office [phone]

## 2023-12-02 NOTE — Care Management Important Message (Signed)
 Important Message  Patient Details  Name: Allen Macdonald MRN: [id number] Date of Birth: March 21, 1951   Important Message Given:  Yes - Medicare IM     Claretta Deed 12/02/2023, 3:02 PM

## 2023-12-02 NOTE — Progress Notes (Addendum)
 PHARMACY - ANTICOAGULATION CONSULT NOTE  Pharmacy Consult:  Heparin  Indication: History of VTE with recurrent CVAs  Allergies  Allergen Reactions   Bee Venom Anaphylaxis    Patient Measurements: Height: 5' 10 (177.8 cm) Weight: 101.8 kg (224 lb 6.9 oz) IBW/kg (Calculated) : 73 HEPARIN  DW (KG): 94.4  Vital Signs: Temp: 98.1 F (36.7 C) (08/15 0359) Temp Source: Oral (08/15 0359) BP: 120/60 (08/15 0359) Pulse Rate: 78 (08/15 0359)  Labs: Recent Labs    11/29/23 2018 11/30/23 0537 11/30/23 0537 11/30/23 0924 11/30/23 2026 12/01/23 0626 12/01/23 1752 12/02/23 0350  HGB 9.0* 8.5*  --   --   --  8.5*  --  7.7*  HCT 28.0* 27.7*  --   --   --  27.1*  --  24.1*  PLT 119* 100*  --   --   --  111*  --  113*  APTT  --   --    < >  --  98* 86* 59* 89*  LABPROT  --   --   --  16.1*  --  15.8*  --  16.4*  INR  --   --   --  1.2  --  1.2  --  1.3*  HEPARINUNFRC  --   --   --   --  >1.10* 0.87*  --  0.44  CREATININE 1.10 1.16  --   --   --   --   --   --    < > = values in this interval not displayed.    Estimated Creatinine Clearance: 65.8 mL/min (by C-G formula based on SCr of 1.16 mg/dL).  Assessment: 40 YOM with 2 recent L MCA strokes s/p MT each time.  Patient went to Rehab and was receiving Eliquis , last dose on 8/12 at 0847.  Patient returned to the inpatient side with recurrent CVA on 11/29/23.  MRI shows petechial hemorrhage with new/increased scattered punctate infarcts and new microhemorrhages.  Pharmacy consulted to dose IV heparin  bridge to Coumadin .  Stopping Eliquis .  8/14 PM: aPTT subtherapeutic at 59 sec on 800 units/hr. INR 1.2. No reported signs or symptoms of bleeding, no issues with heparin  line per RN. Hgb and plts stable.  8/15 AM - aPTT 89 sec, heparin  level 0.44 both therapeutic and correlating on 900 units/hr - will use heparin  levels moving forward.  INR 1.3 remains subtherapeutic as expected after two days of warfarin dosing.  No s/sx bleeding or  infusion issues per RN.  Did note bruising on tongue but appears old.   Goal of Therapy:  INR 2-3 Heparin  level 0.3-0.5 units/ml aPTT 66-85 seconds Monitor platelets by anticoagulation protocol: Yes   Plan:  Continue heparin  IV 900 units/hr Warfarin 7.5mg  PO x 1 today Daily heparin  level, PT / INR and CBC Monitor signs and symptoms of bleeding daily  Maurilio Fila, PharmD Clinical Pharmacist 12/02/2023  6:27 AM

## 2023-12-02 NOTE — Progress Notes (Signed)
 Occupational Therapy Treatment Patient Details Name: Allen Macdonald MRN: [id number] DOB: 04-24-1950 Today's Date: 12/02/2023   History of present illness 73 y.o. male who developed sudden onset of right facial droop, increased right-sided weakness and aphasia on 8/12 at [facility].  CT revealed a thrombus in left ICA terminus with occlusion of left M1 and a near occlusive thrombus of basilar tip.  Patient was seen for mechanical thrombectomy. PMH: Pt in [facility] on 11/20/2023 with R weakness, L gaze and expressive aphasia. CTA with L ICA occlusion, pt underwent thrombectomy on 8/3. Pt with neuro change on 8/4, found to have L M1 occlusion and underwent subsequent thrombectomy and found to have L MCA dissection,  HTN, DVT/PE, bladder CA s/p nephrostomy.   OT comments  Pt continues to present with R inattention during functional mobility, consistently required cues to scan to R side to avoid obstacles at ground level. Requiring min A to safely navigate with RW. Worked with pt on progressing ROM of R shoulder, lot a tone and tightness, performed AAROM with scapular mobilizations to ensure appropriate gliding. OT to continue to progress pt as able. Recommend pt return to CIR when appropriate.       If plan is discharge home, recommend the following:  A lot of help with walking and/or transfers;A lot of help with bathing/dressing/bathroom;Assistance with cooking/housework;Direct supervision/assist for medications management;Direct supervision/assist for financial management;Assist for transportation;Help with stairs or ramp for entrance;Supervision due to cognitive status   Equipment Recommendations  Other (comment) (defer to next level of care)    Recommendations for Other Services      Precautions / Restrictions Precautions Precautions: Fall;Other (comment) Recall of Precautions/Restrictions: Intact Precaution/Restrictions Comments: bilat nephrostomy bags Restrictions Weight Bearing Restrictions Per  Provider Order: No       Mobility Bed Mobility               General bed mobility comments: Pt rec'd and left sitting in recliner.    Transfers Overall transfer level: Needs assistance Equipment used: Rolling walker (2 wheels) Transfers: Sit to/from Stand Sit to Stand: Mod assist           General transfer comment: cues for hand placement, mod a to boost into standing.     Balance Overall balance assessment: Needs assistance Sitting-balance support: Feet supported, No upper extremity supported Sitting balance-Leahy Scale: Fair     Standing balance support: Bilateral upper extremity supported, During functional activity, Reliant on assistive device for balance Standing balance-Leahy Scale: Poor Standing balance comment: with RW support                           ADL either performed or assessed with clinical judgement   ADL                                       Functional mobility during ADLs: Cueing for safety;Rolling walker (2 wheels);Minimal assistance General ADL Comments: focused session on RUE ROM/stretching, attention to R side. Created obstacles for pt to navigate with use of 4 cones for pt to perform zig zag pattern and avoid hitting obstacles on R side. reinforced scanning to R side during activity, pt bumped into 4 obstacles on R.    Extremity/Trunk Assessment Upper Extremity Assessment RUE Deficits / Details: shoulder AROM to 90, flexor tone noted. oppposition to thumb intact but slow. RUE Sensation: WNL RUE Coordination: decreased  fine motor;decreased gross Banker Perception: Impaired Preception Impairment Details: Inattention/Neglect Perception-Other Comments: R inattention.   Praxis Praxis Praxis: Impaired Praxis Impairment Details: Motor planning   Communication Communication Communication: Impaired Factors Affecting Communication: Difficulty expressing self    Cognition Arousal: Alert Behavior During Therapy: Flat affect Cognition: Cognition impaired     Awareness: Online awareness impaired, Intellectual awareness impaired Memory impairment (select all impairments): Working [employer] fast streamer, Conservation officer, historic buildings Attention impairment (select first level of impairment): Sustained attention Executive functioning impairment (select all impairments): Sequencing, Reasoning, Problem solving                   Following commands: Impaired Following commands impaired: Follows one step commands with increased time      Cueing   Cueing Techniques: Verbal cues, Gestural cues, Tactile cues  Exercises Other Exercises Other Exercises: R shoulder elevator exercises with 3-5 sec holds. x 4 reps Other Exercises: AAROM R shoulder flexion beginning at 70 deg, OT assist with full ROM while moblizing scapula.Sitting in recliner Other Exercises: AAROM R horizontal shoulder adduc/abduct with scap protract/retract, scap mobilization throughout. Sitting in recliner Other Exercises: Blue theraspone squeezes. finger opposition as an activitity, progressing in speed. RUE    Shoulder Instructions       General Comments      Pertinent Vitals/ Pain       Pain Assessment Pain Assessment: No/denies pain (pt denies pain, continuously grunts with mobility)  Home Living                                          Prior Functioning/Environment              Frequency  Min 2X/week        Progress Toward Goals  OT Goals(current goals can now be found in the care plan section)  Progress towards OT goals: Progressing toward goals  Acute Rehab OT Goals Patient Stated Goal: to get back to rehab OT Goal Formulation: With patient Time For Goal Achievement: 12/07/23 Potential to Achieve Goals: Good  Plan      Co-evaluation                 AM-PAC OT 6 Clicks Daily Activity     Outcome Measure   Help from another person  eating meals?: A Little Help from another person taking care of personal grooming?: A Little Help from another person toileting, which includes using toliet, bedpan, or urinal?: A Lot Help from another person bathing (including washing, rinsing, drying)?: A Lot Help from another person to put on and taking off regular upper body clothing?: A Little Help from another person to put on and taking off regular lower body clothing?: A Lot 6 Click Score: 15    End of Session Equipment Utilized During Treatment: Gait belt;Rolling walker (2 wheels)  OT Visit Diagnosis: Unsteadiness on feet (R26.81);Other abnormalities of gait and mobility (R26.89);Muscle weakness (generalized) (M62.81);Other symptoms and signs involving cognitive function;Cognitive communication deficit (R41.841) Symptoms and signs involving cognitive functions: Cerebral infarction   Activity Tolerance Patient tolerated treatment well   Patient Left in chair;with call bell/phone within reach;with chair alarm set   Nurse Communication Mobility status        Time: 8699-8672 OT Time Calculation (min): 27 min  Charges: OT  General Charges $OT Visit: 1 Visit OT Treatments $Therapeutic Activity: 8-22 mins $Therapeutic Exercise: 8-22 mins  12/02/2023  AB, OTR/L  Acute Rehabilitation Services  Office: [phone]   Curtistine JONETTA Das 12/02/2023, 4:11 PM

## 2023-12-02 NOTE — Progress Notes (Signed)
 Physical Therapy Treatment Patient Details Name: Allen Macdonald MRN: [id number] DOB: January 12, 1951 Today's Date: 12/02/2023   History of Present Illness 73 y.o. male who developed sudden onset of right facial droop, increased right-sided weakness and aphasia on 8/12 at [facility].  CT revealed a thrombus in left ICA terminus with occlusion of left M1 and a near occlusive thrombus of basilar tip.  Patient was seen for mechanical thrombectomy. PMH: Pt in [facility] on 11/20/2023 with R weakness, L gaze and expressive aphasia. CTA with L ICA occlusion, pt underwent thrombectomy on 8/3. Pt with neuro change on 8/4, found to have L M1 occlusion and underwent subsequent thrombectomy and found to have L MCA dissection,  HTN, DVT/PE, bladder CA s/p nephrostomy.    PT Comments  Pt in bed upon arrival and agreeable to PT session. Worked primarily on dynamic balance and LE strength in today's session. Required ModA to stand from EOB with repetitive cueing for technique. R inattention observed with pt needing cues to attend to R hand and to keep placement on RW handle. Increased difficulty when stepping with R LE sideways and anteriorly over obstacles. Slight R knee instability noted during exercises and gait, however, no buckling or LOB. Pt continues to having difficulty expressing self and at times will repeat statements made by PT. Continue to recommend >3hrs post acute rehab with Acute PT to follow.    If plan is discharge home, recommend the following: A little help with walking and/or transfers;A lot of help with bathing/dressing/bathroom;Assistance with cooking/housework;Direct supervision/assist for medications management;Direct supervision/assist for financial management;Assist for transportation;Help with stairs or ramp for entrance;Supervision due to cognitive status   Can travel by private vehicle      Yes  Equipment Recommendations  Rolling walker (2 wheels);BSC/3in1       Precautions / Restrictions  Precautions Precautions: Fall;Other (comment) Recall of Precautions/Restrictions: Intact Precaution/Restrictions Comments: bilat nephrostomy bags Restrictions Weight Bearing Restrictions Per Provider Order: No     Mobility  Bed Mobility Overal bed mobility: Needs Assistance Bed Mobility: Sit to Supine, Rolling, Sidelying to Sit Rolling: Min assist Sidelying to sit: Min assist, Used rails   Sit to supine: Min assist   General bed mobility comments: able to bring LE's off EOB, cues to bring hand to rail with assist to raise trunk. Slight assist for LE's back into bed    Transfers Overall transfer level: Needs assistance Equipment used: Rolling walker (2 wheels) Transfers: Sit to/from Stand Sit to Stand: Mod assist    General transfer comment: ModA for boost-up and steadying. Repetitive cues for hand placement with increased difficulty placing R hand    Ambulation/Gait Ambulation/Gait assistance: Contact guard assist Gait Distance (Feet): 80 Feet Assistive device: Rolling walker (2 wheels) Gait Pattern/deviations: Step-through pattern, Trunk flexed, Decreased stride length Gait velocity: decr     General Gait Details: able to manage RW with verbal cues. Minimal instability noted in R LE, however, no buckling or LOB.  Modified Rankin (Stroke Patients Only) Modified Rankin (Stroke Patients Only) Pre-Morbid Rankin Score: No symptoms Modified Rankin: Moderately severe disability     Balance Overall balance assessment: Needs assistance Sitting-balance support: Feet supported, No upper extremity supported Sitting balance-Leahy Scale: Fair     Standing balance support: Bilateral upper extremity supported, During functional activity, Reliant on assistive device for balance Standing balance-Leahy Scale: Poor Standing balance comment: with RW support    High level balance activites: Side stepping, Backward walking High Level Balance Comments: Anterior step-over obstacle  Bx5, Lateral step-over obstacle  Bx10, Backwards walking x10 ft     Communication Communication Communication: Impaired Factors Affecting Communication: Difficulty expressing self  Cognition Arousal: Alert Behavior During Therapy: Flat affect   PT - Cognitive impairments: Awareness, Attention, Problem solving, Sequencing    PT - Cognition Comments: Noted expressive difficulty with pt occasionally repeating what therapist said earlier. Appeared to have difficulty word finding Following commands: Impaired Following commands impaired: Follows one step commands with increased time    Cueing Cueing Techniques: Verbal cues, Gestural cues, Tactile cues  Exercises Other Exercises Other Exercises: x5 serial STS with BUE and ModA        Pertinent Vitals/Pain Pain Assessment Pain Assessment: No/denies pain (denies pain, however, grunts with mobility)     PT Goals (current goals can now be found in the care plan section) Acute Rehab PT Goals Patient Stated Goal: to go back to rehab PT Goal Formulation: With patient Time For Goal Achievement: 12/14/23 Potential to Achieve Goals: Fair Progress towards PT goals: Progressing toward goals    Frequency    Min 3X/week       AM-PAC PT 6 Clicks Mobility   Outcome Measure  Help needed turning from your back to your side while in a flat bed without using bedrails?: A Little Help needed moving from lying on your back to sitting on the side of a flat bed without using bedrails?: A Little Help needed moving to and from a bed to a chair (including a wheelchair)?: A Lot Help needed standing up from a chair using your arms (e.g., wheelchair or bedside chair)?: A Lot Help needed to walk in hospital room?: A Little Help needed climbing 3-5 steps with a railing? : Total 6 Click Score: 14    End of Session Equipment Utilized During Treatment: Gait belt Activity Tolerance: Patient tolerated treatment well Patient left: in bed;with call  bell/phone within reach;with bed alarm set Nurse Communication: Mobility status PT Visit Diagnosis: Other abnormalities of gait and mobility (R26.89);Hemiplegia and hemiparesis Hemiplegia - Right/Left: Right Hemiplegia - dominant/non-dominant: Dominant Hemiplegia - caused by: Cerebral infarction     Time: 8391-8369 PT Time Calculation (min) (ACUTE ONLY): 22 min  Charges:    $Therapeutic Activity: 8-22 mins PT General Charges $$ ACUTE PT VISIT: 1 Visit                     Kate ORN, PT, DPT Secure Chat Preferred  Rehab Office [phone]    Kate BRAVO Wendolyn 12/02/2023, 4:44 PM

## 2023-12-02 NOTE — Progress Notes (Signed)
   Inpatient Rehabilitation Admissions Coordinator   We have insurance approval and CIR bed to admit him to on Saturday. I met with patient and his wife at bedside and they are aware and in agreement. Acute team and TOC made aware. I will make the arrangements. Saturday Nurse can call [phone] to verify when bed available and to give report.  Heron Leavell, RN, MSN Rehab Admissions Coordinator [phone] 12/02/2023 12:30 PM

## 2023-12-03 ENCOUNTER — Inpatient Hospital Stay (HOSPITAL_COMMUNITY)
Admission: AD | Admit: 2023-12-03 | Discharge: 2023-12-19 | DRG: 056 | Disposition: A | Discharge status: Home Health | Source: Intra-hospital | Attending: Physical Medicine & Rehabilitation | Admitting: Physical Medicine & Rehabilitation

## 2023-12-03 ENCOUNTER — Encounter (HOSPITAL_COMMUNITY): Payer: Self-pay | Admitting: Physical Medicine & Rehabilitation

## 2023-12-03 ENCOUNTER — Other Ambulatory Visit: Payer: Self-pay

## 2023-12-03 DIAGNOSIS — Z936 Other artificial openings of urinary tract status: Secondary | ICD-10-CM | POA: Diagnosis not present

## 2023-12-03 DIAGNOSIS — I63512 Cerebral infarction due to unspecified occlusion or stenosis of left middle cerebral artery: Secondary | ICD-10-CM

## 2023-12-03 DIAGNOSIS — Z8249 Family history of ischemic heart disease and other diseases of the circulatory system: Secondary | ICD-10-CM

## 2023-12-03 DIAGNOSIS — Z86711 Personal history of pulmonary embolism: Secondary | ICD-10-CM

## 2023-12-03 DIAGNOSIS — I6350 Cerebral infarction due to unspecified occlusion or stenosis of unspecified cerebral artery: Secondary | ICD-10-CM | POA: Diagnosis not present

## 2023-12-03 DIAGNOSIS — K317 Polyp of stomach and duodenum: Secondary | ICD-10-CM | POA: Diagnosis present

## 2023-12-03 DIAGNOSIS — D649 Anemia, unspecified: Secondary | ICD-10-CM | POA: Diagnosis not present

## 2023-12-03 DIAGNOSIS — R195 Other fecal abnormalities: Secondary | ICD-10-CM | POA: Diagnosis not present

## 2023-12-03 DIAGNOSIS — I63543 Cerebral infarction due to unspecified occlusion or stenosis of bilateral cerebellar arteries: Secondary | ICD-10-CM

## 2023-12-03 DIAGNOSIS — I69351 Hemiplegia and hemiparesis following cerebral infarction affecting right dominant side: Secondary | ICD-10-CM | POA: Diagnosis not present

## 2023-12-03 DIAGNOSIS — I69319 Unspecified symptoms and signs involving cognitive functions following cerebral infarction: Secondary | ICD-10-CM

## 2023-12-03 DIAGNOSIS — D6859 Other primary thrombophilia: Secondary | ICD-10-CM | POA: Diagnosis present

## 2023-12-03 DIAGNOSIS — K3189 Other diseases of stomach and duodenum: Secondary | ICD-10-CM | POA: Diagnosis not present

## 2023-12-03 DIAGNOSIS — I69392 Facial weakness following cerebral infarction: Secondary | ICD-10-CM

## 2023-12-03 DIAGNOSIS — I69320 Aphasia following cerebral infarction: Secondary | ICD-10-CM

## 2023-12-03 DIAGNOSIS — Z86718 Personal history of other venous thrombosis and embolism: Secondary | ICD-10-CM

## 2023-12-03 DIAGNOSIS — K2289 Other specified disease of esophagus: Secondary | ICD-10-CM | POA: Diagnosis not present

## 2023-12-03 DIAGNOSIS — K219 Gastro-esophageal reflux disease without esophagitis: Secondary | ICD-10-CM | POA: Diagnosis present

## 2023-12-03 DIAGNOSIS — Z8673 Personal history of transient ischemic attack (TIA), and cerebral infarction without residual deficits: Secondary | ICD-10-CM

## 2023-12-03 DIAGNOSIS — D696 Thrombocytopenia, unspecified: Secondary | ICD-10-CM | POA: Diagnosis not present

## 2023-12-03 DIAGNOSIS — I6602 Occlusion and stenosis of left middle cerebral artery: Secondary | ICD-10-CM | POA: Diagnosis not present

## 2023-12-03 DIAGNOSIS — I639 Cerebral infarction, unspecified: Secondary | ICD-10-CM | POA: Diagnosis not present

## 2023-12-03 DIAGNOSIS — I10 Essential (primary) hypertension: Secondary | ICD-10-CM | POA: Diagnosis present

## 2023-12-03 DIAGNOSIS — N179 Acute kidney failure, unspecified: Secondary | ICD-10-CM | POA: Diagnosis present

## 2023-12-03 DIAGNOSIS — D630 Anemia in neoplastic disease: Secondary | ICD-10-CM | POA: Diagnosis not present

## 2023-12-03 DIAGNOSIS — I420 Dilated cardiomyopathy: Secondary | ICD-10-CM | POA: Diagnosis present

## 2023-12-03 DIAGNOSIS — R4701 Aphasia: Secondary | ICD-10-CM | POA: Diagnosis present

## 2023-12-03 DIAGNOSIS — R7401 Elevation of levels of liver transaminase levels: Secondary | ICD-10-CM | POA: Diagnosis not present

## 2023-12-03 DIAGNOSIS — D124 Benign neoplasm of descending colon: Secondary | ICD-10-CM | POA: Diagnosis not present

## 2023-12-03 DIAGNOSIS — C7951 Secondary malignant neoplasm of bone: Secondary | ICD-10-CM | POA: Diagnosis not present

## 2023-12-03 DIAGNOSIS — K449 Diaphragmatic hernia without obstruction or gangrene: Secondary | ICD-10-CM | POA: Diagnosis present

## 2023-12-03 DIAGNOSIS — I959 Hypotension, unspecified: Secondary | ICD-10-CM | POA: Diagnosis present

## 2023-12-03 DIAGNOSIS — I358 Other nonrheumatic aortic valve disorders: Secondary | ICD-10-CM | POA: Diagnosis present

## 2023-12-03 DIAGNOSIS — R29705 NIHSS score 5: Secondary | ICD-10-CM

## 2023-12-03 DIAGNOSIS — C679 Malignant neoplasm of bladder, unspecified: Secondary | ICD-10-CM | POA: Diagnosis not present

## 2023-12-03 DIAGNOSIS — Z8521 Personal history of malignant neoplasm of larynx: Secondary | ICD-10-CM

## 2023-12-03 DIAGNOSIS — D61818 Other pancytopenia: Secondary | ICD-10-CM | POA: Diagnosis not present

## 2023-12-03 DIAGNOSIS — K5900 Constipation, unspecified: Secondary | ICD-10-CM | POA: Diagnosis present

## 2023-12-03 DIAGNOSIS — Z91030 Bee allergy status: Secondary | ICD-10-CM

## 2023-12-03 DIAGNOSIS — Z79899 Other long term (current) drug therapy: Secondary | ICD-10-CM

## 2023-12-03 DIAGNOSIS — I679 Cerebrovascular disease, unspecified: Secondary | ICD-10-CM | POA: Diagnosis not present

## 2023-12-03 DIAGNOSIS — Z6833 Body mass index (BMI) 33.0-33.9, adult: Secondary | ICD-10-CM

## 2023-12-03 DIAGNOSIS — E876 Hypokalemia: Secondary | ICD-10-CM | POA: Diagnosis not present

## 2023-12-03 DIAGNOSIS — R1310 Dysphagia, unspecified: Secondary | ICD-10-CM | POA: Diagnosis not present

## 2023-12-03 DIAGNOSIS — E669 Obesity, unspecified: Secondary | ICD-10-CM | POA: Diagnosis not present

## 2023-12-03 DIAGNOSIS — R29810 Facial weakness: Secondary | ICD-10-CM | POA: Diagnosis present

## 2023-12-03 DIAGNOSIS — R059 Cough, unspecified: Secondary | ICD-10-CM | POA: Diagnosis present

## 2023-12-03 DIAGNOSIS — E785 Hyperlipidemia, unspecified: Secondary | ICD-10-CM | POA: Diagnosis present

## 2023-12-03 DIAGNOSIS — K31811 Angiodysplasia of stomach and duodenum with bleeding: Secondary | ICD-10-CM | POA: Diagnosis not present

## 2023-12-03 DIAGNOSIS — Z7901 Long term (current) use of anticoagulants: Secondary | ICD-10-CM

## 2023-12-03 DIAGNOSIS — I69398 Other sequelae of cerebral infarction: Secondary | ICD-10-CM | POA: Diagnosis not present

## 2023-12-03 DIAGNOSIS — I951 Orthostatic hypotension: Secondary | ICD-10-CM | POA: Diagnosis present

## 2023-12-03 DIAGNOSIS — K31819 Angiodysplasia of stomach and duodenum without bleeding: Secondary | ICD-10-CM | POA: Diagnosis not present

## 2023-12-03 DIAGNOSIS — R600 Localized edema: Secondary | ICD-10-CM | POA: Diagnosis not present

## 2023-12-03 DIAGNOSIS — K635 Polyp of colon: Secondary | ICD-10-CM | POA: Diagnosis not present

## 2023-12-03 DIAGNOSIS — Z8546 Personal history of malignant neoplasm of prostate: Secondary | ICD-10-CM

## 2023-12-03 DIAGNOSIS — D62 Acute posthemorrhagic anemia: Secondary | ICD-10-CM | POA: Diagnosis not present

## 2023-12-03 DIAGNOSIS — I6522 Occlusion and stenosis of left carotid artery: Secondary | ICD-10-CM | POA: Diagnosis present

## 2023-12-03 DIAGNOSIS — Z8551 Personal history of malignant neoplasm of bladder: Secondary | ICD-10-CM

## 2023-12-03 DIAGNOSIS — I63312 Cerebral infarction due to thrombosis of left middle cerebral artery: Secondary | ICD-10-CM

## 2023-12-03 DIAGNOSIS — Z87440 Personal history of urinary (tract) infections: Secondary | ICD-10-CM

## 2023-12-03 DIAGNOSIS — Z923 Personal history of irradiation: Secondary | ICD-10-CM

## 2023-12-03 LAB — CBC
HCT: 24.1 % — ABNORMAL LOW (ref 39.0–52.0)
Hemoglobin: 7.6 g/dL — ABNORMAL LOW (ref 13.0–17.0)
MCH: 29.6 pg (ref 26.0–34.0)
MCHC: 31.5 g/dL (ref 30.0–36.0)
MCV: 93.8 fL (ref 80.0–100.0)
Platelets: 121 K/uL — ABNORMAL LOW (ref 150–400)
RBC: 2.57 MIL/uL — ABNORMAL LOW (ref 4.22–5.81)
RDW: 18.6 % — ABNORMAL HIGH (ref 11.5–15.5)
WBC: 5.7 K/uL (ref 4.0–10.5)
nRBC: 0 % (ref 0.0–0.2)

## 2023-12-03 LAB — HEPARIN LEVEL (UNFRACTIONATED): Heparin Unfractionated: 0.41 [IU]/mL (ref 0.30–0.70)

## 2023-12-03 LAB — PROTIME-INR
INR: 1.5 — ABNORMAL HIGH (ref 0.8–1.2)
Prothrombin Time: 18.6 s — ABNORMAL HIGH (ref 11.4–15.2)

## 2023-12-03 LAB — OCCULT BLOOD X 1 CARD TO LAB, STOOL: Fecal Occult Bld: NEGATIVE

## 2023-12-03 MED ORDER — SODIUM CHLORIDE 0.9% FLUSH
10.0000 mL | INTRAVENOUS | Status: DC | PRN
  Administered 2023-12-19: 10 mL

## 2023-12-03 MED ORDER — WARFARIN SODIUM 5 MG PO TABS: 10.0000 mg | ORAL_TABLET | Freq: Once | ORAL | Status: DC

## 2023-12-03 MED ORDER — HEPARIN (PORCINE) 25000 UT/250ML-% IV SOLN
900.0000 [IU]/h | INTRAVENOUS | Status: DC
  Administered 2023-12-03: 900 [IU]/h via INTRAVENOUS
  Filled 2023-12-03: qty 250

## 2023-12-03 MED ORDER — HEPARIN (PORCINE) 25000 UT/250ML-% IV SOLN: 900.0000 [IU]/h | INTRAVENOUS | Status: DC

## 2023-12-03 MED ORDER — WARFARIN - PHARMACIST DOSING INPATIENT
Freq: Every day | Status: DC
  Administered 2023-12-03: 1

## 2023-12-03 MED ORDER — SODIUM CHLORIDE 0.9% FLUSH
10.0000 mL | Freq: Two times a day (BID) | INTRAVENOUS | Status: DC
  Administered 2023-12-03 – 2023-12-18 (×20): 10 mL

## 2023-12-03 MED ORDER — WARFARIN SODIUM 10 MG PO TABS: 10.0000 mg | ORAL_TABLET | Freq: Once | ORAL | Status: DC

## 2023-12-03 MED ORDER — CHLORHEXIDINE GLUCONATE CLOTH 2 % EX PADS
6.0000 | MEDICATED_PAD | Freq: Every day | CUTANEOUS | Status: DC
  Administered 2023-12-03 – 2023-12-04 (×2): 6 via TOPICAL

## 2023-12-03 MED ORDER — ACETAMINOPHEN 325 MG PO TABS
650.0000 mg | ORAL_TABLET | Freq: Four times a day (QID) | ORAL | Status: DC | PRN
  Administered 2023-12-09 – 2023-12-18 (×2): 650 mg via ORAL
  Filled 2023-12-03 (×2): qty 2

## 2023-12-03 MED ORDER — WARFARIN SODIUM 10 MG PO TABS
10.0000 mg | ORAL_TABLET | Freq: Once | ORAL | Status: AC
  Administered 2023-12-03: 10 mg via ORAL
  Filled 2023-12-03: qty 1

## 2023-12-03 MED ORDER — ATORVASTATIN CALCIUM 80 MG PO TABS
80.0000 mg | ORAL_TABLET | Freq: Every day | ORAL | Status: DC
  Administered 2023-12-04 – 2023-12-19 (×15): 80 mg via ORAL
  Filled 2023-12-03 (×15): qty 1

## 2023-12-03 NOTE — Plan of Care (Signed)
   Problem: Education: Goal: Knowledge of disease or condition will improve Outcome: Progressing   Problem: Ischemic Stroke/TIA Tissue Perfusion: Goal: Complications of ischemic stroke/TIA will be minimized Outcome: Progressing

## 2023-12-03 NOTE — H&P (Signed)
 Physical Medicine and Rehabilitation Admission H&P  CC: CVA 2/2 left ICA thrombus with M1 occlusion  HPI: Allen Macdonald is a 73 year old man with a PMH of HTN, presenting with acute punctate infarcts in the right occipital and bilateral cerebellar hemispheres s/p mechanical thrombectomy of the left carotid T occlusion and basilar tip occlusion, with TICI 2 revascularization of both occlusions with mechanical thrombectomy. Cause of stroke is thought to be hypercoagulable state in the setting of advanced cancer with Eliquis  failure. He was recently in CIR and transferred to acute due to sudden onset of right facial droop. Increased right sided weakness and expressive and receptive aphasia. Currently with impaired cognition and right sided weakness.  ROS +impaired cognition, right sided weakness Past Medical History:  Diagnosis Date   Abnormal stress test minimal ischemia involving apex 05/01/2019   Bladder cancer (HCC)    Dilated cardiomyopathy (HCC)    Essential hypertension 12/18/2015   History of DVT (deep vein thrombosis) 04/22/2017   History of pulmonary embolism 12/18/2015   Laryngeal cancer (HCC) squamous cell 02/25/2020   PC (prostate cancer) (HCC) 08/28/2019   Past Surgical History:  Procedure Laterality Date   CATARACT EXTRACTION Bilateral    INSERTION PROSTATE RADIATION SEED  2003   IR CT HEAD LTD  11/20/2023   IR PERCUTANEOUS ART THROMBECTOMY/INFUSION INTRACRANIAL INC DIAG ANGIO  11/20/2023   IR PERCUTANEOUS ART THROMBECTOMY/INFUSION INTRACRANIAL INC DIAG ANGIO  11/21/2023   IR PERCUTANEOUS ART THROMBECTOMY/INFUSION INTRACRANIAL INC DIAG ANGIO  11/29/2023   IR THROMBECT SEC MECH MOD SED  11/29/2023   IR US  GUIDE VASC ACCESS RIGHT  11/20/2023   IR US  GUIDE VASC ACCESS RIGHT  11/21/2023   IR US  GUIDE VASC ACCESS RIGHT  11/29/2023   PROSTATE SURGERY     RADIOLOGY WITH ANESTHESIA N/A 11/20/2023   Procedure: RADIOLOGY WITH ANESTHESIA;  Surgeon: Radiologist, Medication, MD;  Location: [facility] OR;   Service: Radiology;  Laterality: N/A;   RADIOLOGY WITH ANESTHESIA N/A 11/21/2023   Procedure: RADIOLOGY WITH ANESTHESIA;  Surgeon: Radiologist, Medication, MD;  Location: [facility] OR;  Service: Radiology;  Laterality: N/A;   RADIOLOGY WITH ANESTHESIA N/A 11/29/2023   Procedure: RADIOLOGY WITH ANESTHESIA;  Surgeon: Radiologist, Medication, MD;  Location: [facility] OR;  Service: Radiology;  Laterality: N/A;   TONSILLECTOMY     Venacava Filter     WISDOM TOOTH EXTRACTION     Family History  Problem Relation Age of Onset   Heart failure Mother    Pleurisy Mother    Other Father        UNK MEDICAL HX   Social History:  reports that he has never smoked. He has never used smokeless tobacco. He reports current alcohol use of about 14.0 standard drinks of alcohol per week. He reports that he does not use drugs. Allergies:  Allergies  Allergen Reactions   Bee Venom Anaphylaxis   Medications Prior to Admission  Medication Sig Dispense Refill   heparin  25000 UT/250ML infusion Inject 900 Units/hr into the vein continuous.     warfarin (COUMADIN ) 10 MG tablet Take 1 tablet (10 mg total) by mouth one time only at 4 PM.        Home: Home Living Family/patient expects to be discharged to:: Private residence Living Arrangements: Spouse/significant other     Physical Exam: Blood pressure 129/73, pulse 82, resp. rate 17, SpO2 98%. Physical Exam Gen: no distress, normal appearing HEENT: oral mucosa pink and moist, NCAT Cardio: Reg rate Chest: normal effort, normal rate  of breathing Abd: soft, non-distended Ext: no edema Psych: pleasant, normal affect Skin: intact Neuro: Alert and oriented x2 (says year is 2022), right sided strength is 4/5, otherwise strength is intact   Results for orders placed or performed during the hospital encounter of 11/29/23 (from the past 48 hours)  APTT     Status: Abnormal   Collection Time: 12/01/23  5:52 PM  Result Value Ref Range   aPTT 59 (H) 24 - 36 seconds     Comment:        IF BASELINE aPTT IS ELEVATED, SUGGEST PATIENT RISK ASSESSMENT BE USED TO DETERMINE APPROPRIATE ANTICOAGULANT THERAPY. Performed at [facility], [address], [place], [place] [address]   Protime-INR     Status: Abnormal   Collection Time: 12/02/23  3:50 AM  Result Value Ref Range   Prothrombin Time 16.4 (H) 11.4 - 15.2 seconds   INR 1.3 (H) 0.8 - 1.2    Comment: (NOTE) INR goal varies based on device and disease states. Performed at [facility], [address], [place], [place] [address]   CBC     Status: Abnormal   Collection Time: 12/02/23  3:50 AM  Result Value Ref Range   WBC 6.0 4.0 - 10.5 K/uL   RBC 2.58 (L) 4.22 - 5.81 MIL/uL   Hemoglobin 7.7 (L) 13.0 - 17.0 g/dL   HCT 75.8 (L) 60.9 - 47.9 %   MCV 93.4 80.0 - 100.0 fL   MCH 29.8 26.0 - 34.0 pg   MCHC 32.0 30.0 - 36.0 g/dL   RDW 81.5 (H) 88.4 - 84.4 %   Platelets 113 (L) 150 - 400 K/uL   nRBC 0.3 (H) 0.0 - 0.2 %    Comment: Performed at [facility], [address], [place], [place] [address]  Heparin  level (unfractionated)     Status: None   Collection Time: 12/02/23  3:50 AM  Result Value Ref Range   Heparin  Unfractionated 0.44 0.30 - 0.70 IU/mL    Comment: (NOTE) The clinical reportable range upper limit is being lowered to >1.10 to align with the FDA approved guidance for the current laboratory assay.  If heparin  results are below expected values, and patient dosage has  been confirmed, suggest follow up testing of antithrombin III levels. Performed at [facility], [address], [place], [place] [address]   APTT     Status: Abnormal   Collection Time: 12/02/23  3:50 AM  Result Value Ref Range   aPTT 89 (H) 24 - 36 seconds    Comment:        IF BASELINE aPTT IS ELEVATED, SUGGEST PATIENT RISK ASSESSMENT BE USED TO DETERMINE APPROPRIATE ANTICOAGULANT THERAPY. Performed at [facility], [address], [place], [place] [address]   Protime-INR      Status: Abnormal   Collection Time: 12/03/23  5:02 AM  Result Value Ref Range   Prothrombin Time 18.6 (H) 11.4 - 15.2 seconds   INR 1.5 (H) 0.8 - 1.2    Comment: (NOTE) INR goal varies based on device and disease states. Performed at [facility], [address], [place], [place] [address]   CBC     Status: Abnormal   Collection Time: 12/03/23  5:02 AM  Result Value Ref Range   WBC 5.7 4.0 - 10.5 K/uL   RBC 2.57 (L) 4.22 - 5.81 MIL/uL   Hemoglobin 7.6 (L) 13.0 - 17.0 g/dL   HCT  24.1 (L) 39.0 - 52.0 %   MCV 93.8 80.0 - 100.0 fL   MCH 29.6 26.0 - 34.0 pg   MCHC 31.5 30.0 - 36.0 g/dL   RDW 81.3 (H) 88.4 - 84.4 %   Platelets 121 (L) 150 - 400 K/uL   nRBC 0.0 0.0 - 0.2 %    Comment: Performed at [facility], [address] [place], [place] [address]  Heparin  level (unfractionated)     Status: None   Collection Time: 12/03/23  5:02 AM  Result Value Ref Range   Heparin  Unfractionated 0.41 0.30 - 0.70 IU/mL    Comment: (NOTE) The clinical reportable range upper limit is being lowered to >1.10 to align with the FDA approved guidance for the current laboratory assay.  If heparin  results are below expected values, and patient dosage has  been confirmed, suggest follow up testing of antithrombin III levels. Performed at [facility], [address] [place], [place] [address]    No results found.    Blood pressure 129/73, pulse 82, resp. rate 17, SpO2 98%.  Medical Problem List and Plan: 1. Functional deficits secondary to left MCA scattered infarcts with left ICA and left MCA occlusion s/p IR with TICI2c likely due to hypercoagulable state in setting of cancer             -patient may shower, cover incision/nephrostomy tubes             -ELOS/Goals: 12-16 days, PT/OT sup to min A, SLP supervision             -Admit to CIR  2.  Anemia: -DVT/anticoagulation:  Mechanical: Sequential compression devices, below knee Bilateral lower extremities Pharmaceutical:  Heparin  drip and Coumadin , Hgb trending down, repeat tomorrow and stool occult ordered             -antiplatelet therapy: N/A  3. Pain Management: Tylenol  prn   4. Mood/Behavior/Sleep: LCSW to follow for evaluation and support when available.              -antipsychotic agents: N/A  5. Neuropsych/cognition: This patient may be capable of making decisions on his own behalf.  6. Skin/Wound Care: routine pressure relief measures   7. Fluids/Electrolytes/Nutrition: Monitor I/O              - Per speech past swallow study continue regular diet w/ Ensure and multivitamin supplements  8.  Bladder Cancer s/p nephrostomy tubes: stage IV, metastatic to the bone by MRI of the spine on 11/08/2023.  -UTI: Ceftriaxone  ends 8/10 -Hx of prostate CA, on androgen therapy    9. AKI: Monitor BMP w/GFR  Cr 1.22--1.42--1.52. Monitor fluid intake  10.  Obesity: provide dietary education  11. Hypotension: continue to hold home amlodipine  and Coreg   12. DVT: hx of multiple BL DVT and PE had IVC filter placed 2014 --resumed Eliquis               -US  LE positive for age indeterminate DVTs: RT - PopV and PTV LT - CFV, PFV, FV, PopV, Gastrocs   13. HLD: LDL 112, goal < 70 atorvastatin  40 mg   14. Hx of laryngeal cancer s/p radiation therapy  I have personally performed a face to face diagnostic evaluation, including, but not limited to relevant history and physical exam findings, of this patient and developed relevant assessment and plan.  Additionally, I have reviewed and concur with the physician assistant's documentation above.   Allen SHAUNNA Elks, MD 12/03/2023

## 2023-12-03 NOTE — Progress Notes (Incomplete)
 Inpatient Rehabilitation {Admission / Discharge:26440} Medication Review by a Pharmacist  A complete drug regimen review was completed for this patient to identify any potential clinically significant medication issues.  High Risk Drug Classes Is patient taking? Indication by Medication  Antipsychotic {Receiving?:26196}   Anticoagulant Yes Warfarin and heparin : Hx DVT/PE, Stroke ppx  Antibiotic No   Opioid {Receiving?:26196}   Antiplatelet {Receiving?:26196}   Hypoglycemics/insulin {Yes or No?:26198}   Vasoactive Medication {Receiving?:26196}   Chemotherapy No   Other Yes Lipitor : HLD     Type of Medication Issue Identified Description of Issue Recommendation(s)  Drug Interaction(s) (clinically significant)     Duplicate Therapy     Allergy      No Medication Administration End Date     Incorrect Dose     Additional Drug Therapy Needed     Significant med changes from prior encounter (inform family/care partners about these prior to discharge). STOP: apixaban   PTA meds not resumed: Coreg , amlodipine , bicalutamide , pepcid , Prilosec Restart or discontinue as appropriate. Communicate medication changes with patient/family at discharge  Other       Clinically significant medication issues were identified that warrant physician communication and completion of prescribed/recommended actions by midnight of the next day:  {Yes or No?:26198}  Name of provider notified for urgent issues identified: ***   Provider Method of Notification: ***    Pharmacist comments: ***   Time spent performing this drug regimen review (minutes): 30   Thank you for allowing pharmacy to be a part of this patient's care.   Allen Macdonald, PharmD 12/03/2023 4:49 PM     **Pharmacist phone directory can be found on [email or web address] listed under [facility]**

## 2023-12-03 NOTE — Discharge Summary (Addendum)
 Stroke Discharge Summary  Patient ID: Allen Macdonald   MRN: [id number]      DOB: 1950-11-07  Date of Admission: 11/29/2023 Date of Discharge: 12/03/2023  Attending Physician:  Jerri Pfeiffer, MD Consultant(s):    None  Patient's PCP:  Ina Marcellus RAMAN, MD  DISCHARGE PRIMARY DIAGNOSIS: Acute Ischemic Infarct:  right  punctate infarcts in the right occipital and bilateral cerebellar hemispheres. s/p mechanical thrombectomy of left carotid T occlusion and basilar tip occlusion, with TICI 3 revascularization of both occlusions with mechanical thrombectomy Etiology:  Likely due to hypercoagulable state in the setting of advanced cancer with failure of Eliquis    Patient Active Problem List   Diagnosis Date Noted   Stroke (cerebrum) (HCC) 11/29/2023   Stroke (HCC) 11/29/2023   Orthostatic hypotension 11/25/2023   Hyperlipidemia 11/25/2023   AKI (acute kidney injury) (HCC) 11/25/2023   UTI (urinary tract infection) 11/25/2023   Fever in newborn 11/25/2023   Obesity 11/25/2023   Dysphagia 11/25/2023   Anemia 11/25/2023   CVA (cerebral vascular accident) (HCC) 11/25/2023   Acute ischemic left MCA stroke (HCC) 11/20/2023   Iron deficiency anemia due to chronic blood loss 08/03/2023   Acquired hallux rigidus, left 11/20/2020   Plantar fasciitis 11/20/2020   Strain of left foot 11/20/2020   Laryngeal cancer (HCC) squamous cell 02/25/2020   History of pulmonary embolus (PE)    PC (prostate cancer) (HCC) 08/28/2019   Abnormal stress test minimal ischemia involving apex 05/01/2019   History of DVT (deep vein thrombosis) 04/22/2017   Left knee injury 12/29/2015   Dilated cardiomyopathy (HCC) 12/18/2015   Essential hypertension 12/18/2015   History of pulmonary embolism 12/18/2015     Allergies as of 12/03/2023       Reactions   Bee Venom Anaphylaxis        Medication List     STOP taking these medications    amLODipine  5 MG tablet Commonly known as: NORVASC    apixaban  5 MG  Tabs tablet Commonly known as: ELIQUIS    atorvastatin  80 MG tablet Commonly known as: LIPITOR    feeding supplement Liqd   multivitamin with minerals Tabs tablet   thiamine  100 MG tablet Commonly known as: Vitamin B-1       TAKE these medications    heparin  74999 UT/250ML infusion Inject 900 Units/hr into the vein continuous.   warfarin 10 MG tablet Commonly known as: COUMADIN  Take 1 tablet (10 mg total) by mouth one time only at 4 PM.        LABORATORY STUDIES CBC    Component Value Date/Time   WBC 5.7 12/03/2023 0502   RBC 2.57 (L) 12/03/2023 0502   HGB 7.6 (L) 12/03/2023 0502   HGB 9.0 (L) 07/15/2023 1309   HGB 13.5 04/22/2017 1023   HCT 24.1 (L) 12/03/2023 0502   HCT 39.2 04/22/2017 1023   PLT 121 (L) 12/03/2023 0502   PLT 170 07/15/2023 1309   PLT 164 04/22/2017 1023   MCV 93.8 12/03/2023 0502   MCV 95 04/22/2017 1023   MCH 29.6 12/03/2023 0502   MCHC 31.5 12/03/2023 0502   RDW 18.6 (H) 12/03/2023 0502   RDW 14.0 04/22/2017 1023   LYMPHSABS 1.3 11/28/2023 0339   MONOABS 0.5 11/28/2023 0339   EOSABS 0.2 11/28/2023 0339   BASOSABS 0.1 11/28/2023 0339   CMP    Component Value Date/Time   NA 139 11/30/2023 0537   NA 139 07/05/2019 1045   K 3.9 11/30/2023 0537   CL  109 11/30/2023 0537   CO2 23 11/30/2023 0537   GLUCOSE 90 11/30/2023 0537   BUN 20 11/30/2023 0537   BUN 11 07/05/2019 1045   CREATININE 1.16 11/30/2023 0537   CALCIUM  8.3 (L) 11/30/2023 0537   PROT 5.7 (L) 11/29/2023 2018   ALBUMIN  2.7 (L) 11/29/2023 2018   AST 49 (H) 11/29/2023 2018   ALT 70 (H) 11/29/2023 2018   ALKPHOS 116 11/29/2023 2018   BILITOT 0.9 11/29/2023 2018   GFRNONAA >60 11/30/2023 0537   GFRAA 91 07/05/2019 1045   COAGS Lab Results  Component Value Date   INR 1.5 (H) 12/03/2023   INR 1.3 (H) 12/02/2023   INR 1.2 12/01/2023   Lipid Panel    Component Value Date/Time   CHOL 170 11/21/2023 0648   TRIG 101 11/21/2023 0648   HDL 38 (L) 11/21/2023 0648    CHOLHDL 4.5 11/21/2023 0648   VLDL 20 11/21/2023 0648   LDLCALC 112 (H) 11/21/2023 0648   HgbA1C  Lab Results  Component Value Date   HGBA1C 5.7 (H) 11/20/2023   Alcohol Level    Component Value Date/Time   [facility] <15 11/20/2023 0959     SIGNIFICANT DIAGNOSTIC STUDIES      HISTORY OF PRESENT ILLNESS 73 y.o. patient with history of   2 recent left MCA strokes status post mechanical thrombectomies of both, hypertension, DVT, PE on Eliquis  and bladder cancer status post bilateral nephrostomies who was in rehabilitation after his previous strokes and developed today sudden onset of right facial droop, increased right-sided weakness and expressive and receptive aphasia   NIH on Admission 10    HOSPITAL COURSE Acute Ischemic Infarct:  right  punctate infarcts in the right occipital and bilateral cerebellar hemispheres. s/p mechanical thrombectomy of left carotid T occlusion and basilar tip occlusion, with TICI 3 revascularization of both occlusions with mechanical thrombectomy Etiology:  Likely due to hypercoagulable state in the setting of advanced cancer with failure of Eliquis  Code Stroke CT head No acute abnormality. ASPECTS 10. Evolving subacute left MCA infarcts    CTA head & neck New/recurrent thrombus in the left ICA terminus with occlusion of the left M1 origin. Reconstitution of the distal left M1 segment, however there are multiple occluded mid M2 and more distal branches. New severe stenosis or near occlusive thrombus at the basilar tip involving the origins of both PCAs.  Increased, severe stenosis of the distal V4 segment of the non-dominant right vertebral artery. MRI  Scattered Left MCA territory infarcts, New/increased scattered and generally punctate infarcts in the right occipital and bilateral cerebellar hemispheres. Several new micro-hemorrhages in those areas also. 2D Echo EF 50-55%, bubble study negative US  LE positive for age indeterminate DVTs: RT - PopV and PTV LT -  CFV, PFV, FV, PopV, Gastrocs  TCD bubble study no DVT LDL 112 HgbA1c 5.7 VTE prophylaxis - IV heparin   Eliquis  prior to admission, now on IV heparin  and Coumadin  with plan to transition to Coumadin   Therapy recommendations:  CIR Disposition: Pending   Hx of Stroke/TIA August 2025: left MCA scattered infarcts with left tICA and M1 occlusion s/p IR with TICI2c  Recurrent stroke: extension of left MCA scattered infarcts with left M1 re-occlusion s/p IR with TICI 2b due to dissection and hypotension.  Discharged to rehab on Eliquis    History of DVT/PE Hx of multiple b/l DVT and PE Had IVC filter placed ~ 2014 Likely related to hypercoagulable state from advanced malignancy Home Meds: Eliquis  2.5 bid US  LE positive  for age indeterminate DVTs: RT - PopV and PTV LT - CFV, PFV, FV, PopV, Gastrocs    Hypertension Home meds:  Amlodipine  5 mg, carvedilol  12.5 mg  Stable Blood Pressure Goal: SBP 120-160 for first 24 hours then less than 180    Hyperlipidemia Home meds: Atorvastatin  40mg ,  resumed in hospital LDL 112, goal < 70   Continue statin at discharge   Bladder cancer s/p bilateral nephrostomy tubes  Poorly differentiated bladder cancer, now stage IV, metastatic to the bone by MRI of the spine on 11/08/2023.    Dysphagia Patient has post-stroke dysphagia, SLP consulted       Diet    Diet regular Room service appropriate? Yes; Fluid consistency: Thin        Advance diet as tolerated   Anemia  Hgb 8.5 monitor    Other acute medical issues Hx of prostate cancer, on ADT H/o laryngeal cancer in 2021. S/p radiation therapy.      RN Pressure Injury Documentation:     DISCHARGE EXAM  PHYSICAL EXAM General:  Alert, well-nourished, well-developed patient in no acute distress Psych:  Mood and affect appropriate for situation CV: Regular rate and rhythm on monitor Respiratory:  Regular, unlabored respirations on room air GI: Abdomen soft and nontender     NEURO:   Mental Status: Awake and alert able to state his name, and age stated wrong place.  He does have expressive and receptive aphasia with some dysarthria.  Able to name some items     Cranial Nerves:  II: PERRL. Visual fields full.  III, IV, VI: EOMI. Eyelids elevate symmetrically.  V: Sensation is intact to light touch and symmetrical to face.  VII: Right facial droop VIII: hearing intact to voice. IX, X: Palate elevates symmetrically. Phonation is normal.  KP:Dynloizm shrug 5/5. XII: tongue is midline without fasciculations. Motor: 5/5 strength in left arm and bilateral legs, right arm 4/5 with drift Tone: is normal and bulk is normal Sensation- Intact to light touch bilaterally. Extinction absent to light touch to DSS.   Coordination: Mild ataxia on right, decreased fine motor skills on right Gait- deferred   Most Recent NIH  1a Level of Conscious.:  1b LOC Questions: 1 1c LOC Commands:  2 Best Gaze:  3 Visual:  4 Facial Palsy:  5a Motor Arm - left: 1 5b Motor Arm - Right:  6a Motor Leg - Left:  6b Motor Leg - Right:  7 Limb Ataxia: 1 8 Sensory:  9 Best Language: 1 10 Dysarthria: 1 11 Extinct. and Inatten.:  TOTAL: 5   Discharge Diet       Diet   Diet regular Room service appropriate? Yes; Fluid consistency: Thin   liquids  DISCHARGE PLAN Disposition: Rehab heparin  IV and warfarin daily for now.  Transitioning to Coumadin  for stroke prevention Ongoing stroke risk factor control by Primary Care Physician at time of discharge Follow-up PCP Ina Marcellus RAMAN, MD in 2 weeks. Follow-up in [facility] in 8 weeks, office to schedule an appointment.  Able to see NP in clinic.  50 minutes were spent preparing discharge.   Karna Geralds DNP, ACNPC-AG  [facility]  ATTENDING NOTE: I reviewed above note and agree with the assessment and plan. Wife and daughter are at the bedside. Pt medically stable for CIR placement. INR 1.5,  continue heparin  IV bridge to coumadin . Follow up at [facility].   For detailed assessment and plan, please refer to above as I have made  changes wherever appropriate.   Ary Cummins, MD PhD Stroke Neurology 12/03/2023 3:37 PM

## 2023-12-03 NOTE — Progress Notes (Signed)
 PHARMACY - ANTICOAGULATION CONSULT NOTE  Pharmacy Consult:  Heparin  Indication: History of VTE with recurrent CVAs  Allergies  Allergen Reactions   Bee Venom Anaphylaxis    Patient Measurements:    Vital Signs: Temp: 98 F (36.7 C) (08/16 1110) Temp Source: Oral (08/16 0733) BP: 129/73 (08/16 1300) Pulse Rate: 82 (08/16 1300)  Labs: Recent Labs    12/01/23 0626 12/01/23 1752 12/02/23 0350 12/03/23 0502  HGB 8.5*  --  7.7* 7.6*  HCT 27.1*  --  24.1* 24.1*  PLT 111*  --  113* 121*  APTT 86* 59* 89*  --   LABPROT 15.8*  --  16.4* 18.6*  INR 1.2  --  1.3* 1.5*  HEPARINUNFRC 0.87*  --  0.44 0.41    Estimated Creatinine Clearance: 65.8 mL/min (by C-G formula based on SCr of 1.16 mg/dL).  Assessment: 60 YOM with 2 recent L MCA strokes s/p MT each time.  Patient went to Rehab and was receiving Eliquis , last dose on 8/12 at 0847.  Patient returned to the inpatient side with recurrent CVA on 11/29/23.  During admission, MRI showed petechial hemorrhage with new/increased scattered punctate infarcts and new microhemorrhages. Patient was on heparin  infusion inpatient, dosed by pharmacy. Pharmacy consulted to continue IV heparin  during rehab admission.  This morning during inpatient stay, heparin  level was 0.41 which is therapeutic. INR 1.5 remained subtherapeutic as expected after three days of warfarin dosing. Hemoglobin has been slightly downtrending and platelets have been stable. No s/sx bleeding or infusion issues per RN.  Did note bruising on tongue but appears old.   Goal of Therapy:  INR 2-3 Heparin  level 0.3-0.5 units/ml aPTT 66-85 seconds Monitor platelets by anticoagulation protocol: Yes  Plan:  Continue heparin  IV 900 units/hr Warfarin 10mg  PO x 1 today Daily heparin  level, PT / INR and CBC Monitor signs and symptoms of bleeding daily  Thank you for allowing pharmacy to be involved with this patient's care.  Mendel Barter, PharmD PGY1 Clinical Pharmacist [facility]  12/03/2023 3:20 PM

## 2023-12-03 NOTE — Progress Notes (Signed)
 PHARMACY - ANTICOAGULATION CONSULT NOTE  Pharmacy Consult:  Heparin  Indication: History of VTE with recurrent CVAs  Allergies  Allergen Reactions   Bee Venom Anaphylaxis    Patient Measurements: Height: 5' 10 (177.8 cm) Weight: 101.8 kg (224 lb 6.9 oz) IBW/kg (Calculated) : 73 HEPARIN  DW (KG): 94.4  Vital Signs: Temp: 98.1 F (36.7 C) (08/16 0416) Temp Source: Oral (08/16 0416) BP: 121/62 (08/16 0416) Pulse Rate: 72 (08/16 0416)  Labs: Recent Labs    12/01/23 0626 12/01/23 1752 12/02/23 0350 12/03/23 0502  HGB 8.5*  --  7.7* 7.6*  HCT 27.1*  --  24.1* 24.1*  PLT 111*  --  113* 121*  APTT 86* 59* 89*  --   LABPROT 15.8*  --  16.4* 18.6*  INR 1.2  --  1.3* 1.5*  HEPARINUNFRC 0.87*  --  0.44 0.41    Estimated Creatinine Clearance: 65.8 mL/min (by C-G formula based on SCr of 1.16 mg/dL).  Assessment: 65 YOM with 2 recent L MCA strokes s/p MT each time.  Patient went to Rehab and was receiving Eliquis , last dose on 8/12 at 0847.  Patient returned to the inpatient side with recurrent CVA on 11/29/23.  MRI shows petechial hemorrhage with new/increased scattered punctate infarcts and new microhemorrhages.  Pharmacy consulted to dose IV heparin  bridge to Coumadin .  Stopping Eliquis .   As of yesterday, both heparin  level and aPTT were therapeutic and correlating, so using heparin  levels moving forward. This morning, heparin  level is 0.41 which is therapeutic. INR 1.5 remains subtherapeutic as expected after three days of warfarin dosing. Hemoglobin has been slightly downtrending and platelets have been stable. No s/sx bleeding or infusion issues per RN.  Did note bruising on tongue but appears old.   Goal of Therapy:  INR 2-3 Heparin  level 0.3-0.5 units/ml aPTT 66-85 seconds Monitor platelets by anticoagulation protocol: Yes  Plan:  Continue heparin  IV 900 units/hr Warfarin 10mg  PO x 1 today Daily heparin  level, PT / INR and CBC Monitor signs and symptoms of bleeding  daily  Thank you for allowing pharmacy to be involved with this patient's care.  Mendel Barter, PharmD PGY1 Clinical Pharmacist [facility]  12/03/2023 7:13 AM

## 2023-12-03 NOTE — Progress Notes (Signed)
 Lorilee Sven SQUIBB, MD  Physician Physical Medicine and Rehabilitation   PMR Pre-admission    Signed   Date of Service: 12/02/2023  5:06 PM  Related encounter: Admission (Discharged) from 11/29/2023 in [facility]   Signed     Expand All Collapse All  Show:Clear all [x] Written[x] Templated[x] Copied  Added by: [x] Alison, Heron MATSU, RN[x] Raulkar, Sven SQUIBB, MD[x] Warren, Caitlin E, PT  [] Hover for details PMR Admission Coordinator Pre-Admission Assessment   Patient: Allen Macdonald is an 73 y.o., male MRN: [id number] DOB: 01/09/1951 Height: 5' 10 (177.8 cm) Weight: 104.5 kg   Insurance Information HMO:     PPO: yes     PCP:      IPA:      80/20:      OTHER:  PRIMARY: [employer] Medicare HMO/PPO     Policy#: [id number]       Subscriber: pt CM Name: Annabella      Phone#: [phone]   Fax#: [phone] Pre-Cert#: [id number] auth 12/02/23 through 8/21 with updates due to fax listed above     Employer:  Benefits:  Phone #: [phone]     Name:  Eff. Date: 04/20/23     Deduct: $0      Out of Pocket Max: $4150 (met)      Life Max:  CIR: $300/day for days 1-6      SNF: $10/day Outpatient:      Co-Pay: $10/visit Home Health: 100%      Co-Pay:  DME: 80%     Co-Pay: 20% Providers:  SECONDARY:       Policy#:      Phone#:    Artist:       Phone#:    The Engineer, materials Information Summary" for patients in Inpatient Rehabilitation Facilities with attached "Privacy Act Statement-Health Care Records" was provided and verbally reviewed with: Patient and Family   Emergency Contact Information Contact Information       Name Relation Home Work Mobile    [place] Spouse [phone]         Other Contacts   None on File      Current Medical History  Patient Admitting Diagnosis: L MCA CVA   History of Present Illness: Pt is a 73 y/o male with PMH of HTN, DVT/PE, bladder cancer s/p nephrostomy who was admitted to [facility] on 11/20/23 with R hemiparesis, L gaze preference, and aphasia.  Pt on eliquis  having recently switched from xarelto .  CT head showed no abnormality, CTA head/neck showed terminal L ICA occlusion with poor opacification of L MCA branches.  NIHSS was 24 on neurology consult.  He was not a candidate for TNK due to eliquis , but did proceed for thrombectomy with IR.  After working with PT on 8/4 pt had sudden change in status, and stat CTA/profusion scan ordered which demonstrated L MCA dissection so was taken for urgent thrombectomy which did not result in any improvement in functional status.  Stroke workup revealed MRI confirmed L MCA strokes as well as R occipital lobe and R frontal white matter, age indeterminate DVTs in LEs (see report).  Hospital course complicated by ABLA requiring 2u of blood.  Eliquis  to be resumed once hgb stable.  He was transferred to CIR on 11/25/23 where he was progressing well.  ON 8/12 he developed sudden onset right facial droop, increased R hemiparesis, and aphasia.  Code stroke was activated and CTA head showed thrombus in the left ICA terminus and  near occlusive thrombus of the basilar tip.  Pt was transferred emergently back to the acute setting for mechanical thrombectomy.  Post procedure he was started on IV heparin  as a bridge to coumadin .  Therapy evaluations were completed and he was recommended to return to CIR to complete the program.     Complete NIHSS TOTAL: 3   Patient's medical record from [facility] has been reviewed by the rehabilitation admission coordinator and physician.   Past Medical History      Past Medical History:  Diagnosis Date   Abnormal stress test minimal ischemia involving apex 05/01/2019   Bladder cancer (HCC)     Dilated cardiomyopathy (HCC)     Essential hypertension 12/18/2015   History of DVT (deep vein thrombosis) 04/22/2017   History of pulmonary embolism 12/18/2015   Laryngeal cancer (HCC) squamous cell 02/25/2020   PC (prostate  cancer) (HCC) 08/28/2019        Has the patient had major surgery during 100 days prior to admission? Yes   Family History   family history includes Heart failure in his mother; Other in his father; Pleurisy in his mother.   Current Medications  Current Medications    Current Facility-Administered Medications:    acetaminophen  (TYLENOL ) tablet 650 mg, 650 mg, Oral, Q4H PRN **OR** acetaminophen  (TYLENOL ) 160 MG/5ML solution 650 mg, 650 mg, Per Tube, Q4H PRN **OR** acetaminophen  (TYLENOL ) suppository 650 mg, 650 mg, Rectal, Q4H PRN, Lindzen, Eric, MD   atorvastatin  (LIPITOR ) tablet 80 mg, 80 mg, Oral, Daily, Waddell Aquas A, NP, 80 mg at 12/02/23 9085   Chlorhexidine  Gluconate Cloth 2 % PADS 6 each, 6 each, Topical, Daily, Lindzen, Eric, MD, 6 each at 12/02/23 0916   heparin  ADULT infusion 100 units/mL (25000 units/250mL), 900 Units/hr, Intravenous, Continuous, Zakrajsek, Katelyn M, RPH, Last Rate: 9 mL/hr at 12/02/23 0429, 900 Units/hr at 12/02/23 0429   warfarin (COUMADIN ) tablet 7.5 mg, 7.5 mg, Oral, q1600, Lindzen, Eric, MD, 7.5 mg at 12/02/23 1612   Warfarin - Pharmacist Dosing Inpatient, , Does not apply, q1600, Dang, Thuy D, RPH, Given at 12/01/23 1639     Patients Current Diet:  Diet Order                  Diet regular Room service appropriate? Yes; Fluid consistency: Thin  Diet effective now                       Precautions / Restrictions Precautions Precautions: Fall, Other (comment) Precaution/Restrictions Comments: bilat nephrostomy bags Restrictions Weight Bearing Restrictions Per Provider Order: No    Has the patient had 2 or more falls or a fall with injury in the past year? No   Prior Activity Level Community (5-7x/wk): independent, driving, no DME at baseline   Prior Functional Level Self Care: Did the patient need help bathing, dressing, using the toilet or eating? Independent   Indoor Mobility: Did the patient need assistance with walking from room  to room (with or without device)? Independent   Stairs: Did the patient need assistance with internal or external stairs (with or without device)? Independent   Functional Cognition: Did the patient need help planning regular tasks such as shopping or remembering to take medications? Independent   Patient Information Are you of Hispanic, Latino/a,or Spanish origin?: A. No, not of Hispanic, Latino/a, or Spanish origin What is your race?: A. White Do you need or want an interpreter to communicate with a doctor or health care staff?: 0.  No   Patient's Response To:  Health Literacy and Transportation Is the patient able to respond to health literacy and transportation needs?: Yes Health Literacy - How often do you need to have someone help you when you read instructions, pamphlets, or other written material from your doctor or pharmacy?: Never In the past 12 months, has lack of transportation kept you from medical appointments or from getting medications?: No In the past 12 months, has lack of transportation kept you from meetings, work, or from getting things needed for daily living?: No   Journalist, newspaper / Equipment Home Equipment: Cane - single point, Toilet riser, Agricultural consultant (2 wheels), Rollator (4 wheels), Wheelchair - manual, Shower seat   Prior Device Use: Indicate devices/aids used by the patient prior to current illness, exacerbation or injury? None of the above   Current Functional Level Cognition   Overall Cognitive Status: No family/caregiver present to determine baseline cognitive functioning (pt has aphasia - evaluation focused on language) Orientation Level: Oriented to person, Oriented to place, Oriented to situation, Disoriented to time Attention: Sustained Sustained Attention: Appears intact Selective Attention: Appears intact Memory: Appears intact (recalled his wife and hospital admit for strokes) Awareness: Appears intact    Extremity Assessment (includes  Sensation/Coordination)   Upper Extremity Assessment: Left hand dominant, RUE deficits/detail RUE Deficits / Details: shoulder AROM to 90, flexor tone noted. oppposition to thumb intact but slow. RUE Sensation: WNL RUE Coordination: decreased fine motor, decreased gross motor  Lower Extremity Assessment: Defer to PT evaluation RLE Deficits / Details: ROM WFL, grossly 4/5 but weaker compared to L     ADLs   Overall ADL's : Needs assistance/impaired Eating/Feeding: Set up, Bed level Eating/Feeding Details (indicate cue type and reason): self feeding L hand Grooming: Set up, Sitting Upper Body Bathing: Minimal assistance, Sitting Lower Body Bathing: Maximal assistance, Moderate assistance, Sitting/lateral leans, Sit to/from stand Upper Body Dressing : Minimal assistance, Sitting Lower Body Dressing: Maximal assistance, Moderate assistance, Sit to/from stand, Sitting/lateral leans Toilet Transfer: Moderate assistance, Stand-pivot, BSC/3in1 Toilet Transfer Details (indicate cue type and reason): simulated Toileting- Clothing Manipulation and Hygiene: Total assistance Toileting - Clothing Manipulation Details (indicate cue type and reason): unaware of minimal BM Functional mobility during ADLs: Cueing for safety, Rolling walker (2 wheels), Minimal assistance General ADL Comments: focused session on RUE ROM/stretching, attention to R side. Created obstacles for pt to navigate with use of 4 cones for pt to perform zig zag pattern and avoid hitting obstacles on R side. reinforced scanning to R side during activity, pt bumped into 4 obstacles on R.     Mobility   Overal bed mobility: Needs Assistance Bed Mobility: Sit to Supine, Rolling, Sidelying to Sit Rolling: Min assist Sidelying to sit: Min assist, Used rails Supine to sit: Min assist, HOB elevated, Used rails Sit to supine: Min assist General bed mobility comments: able to bring LE's off EOB, cues to bring hand to rail with assist to raise  trunk. Slight assist for LE's back into bed     Transfers   Overall transfer level: Needs assistance Equipment used: Rolling walker (2 wheels) Transfers: Sit to/from Stand Sit to Stand: Mod assist Step pivot transfers: Mod assist General transfer comment: ModA for boost-up and steadying. Repetitive cues for hand placement with increased difficulty placing R hand     Ambulation / Gait / Stairs / Wheelchair Mobility   Ambulation/Gait Ambulation/Gait assistance: Contact guard assist Gait Distance (Feet): 80 Feet Assistive device: Rolling walker (2 wheels)  Gait Pattern/deviations: Step-through pattern, Trunk flexed, Decreased stride length General Gait Details: able to manage RW with verbal cues. Minimal instability noted in R LE, however, no buckling or LOB. Gait velocity: decr     Posture / Balance Balance Overall balance assessment: Needs assistance Sitting-balance support: Feet supported, No upper extremity supported Sitting balance-Leahy Scale: Fair Standing balance support: Bilateral upper extremity supported, During functional activity, Reliant on assistive device for balance Standing balance-Leahy Scale: Poor Standing balance comment: with RW support High level balance activites: Side stepping, Backward walking High Level Balance Comments: Anterior step-over obstacle Bx5, Lateral step-over obstacle Bx10, Backwards walking x10 ft Timed Up and Go Test TUG: Normal TUG Normal TUG (seconds): 59.1     Special considerations/life events       Previous Home Environment ( Living Arrangements: Spouse/significant other  Lives With: Spouse Available Help at Discharge: Family, Available 24 hours/day Type of Home: House Home Layout: One level Home Access: Stairs to enter Entrance Stairs-Rails: Can reach both Entrance Stairs-Number of Steps: 3 Bathroom Toilet: Standard Bathroom Accessibility: Yes   Discharge Living Setting Plans for Discharge Living Setting: Patient's home, Lives  with (comment) (spouse) Type of Home at Discharge: House Discharge Home Layout: One level Discharge Home Access: Stairs to enter Entrance Stairs-Rails: Can reach both Entrance Stairs-Number of Steps: 3 Discharge Bathroom Shower/Tub: Walk-in shower, Tub/shower unit Discharge Bathroom Toilet: Handicapped height Discharge Bathroom Accessibility: Yes How Accessible: Accessible via walker Does the patient have any problems obtaining your medications?: No   Social/Family/Support Systems Patient Roles: Spouse Anticipated Caregiver: [name] (spouse) Anticipated Caregiver's Contact Information: [phone] Ability/Limitations of Caregiver: per chart, [name] is legally blind; however she is able to see, just not well enough to drive. Caregiver Availability: 24/7 Discharge Plan Discussed with Primary Caregiver: Yes Is Caregiver In Agreement with Plan?: Yes Does Caregiver/Family have Issues with Lodging/Transportation while Pt is in Rehab?: No   Goals Patient/Family Goal for Rehab: PT/OT supervision to min assist; SLP supervision Expected length of stay: 12-16 days Additional Information: Discharge plan: expect discharge back to pt's home with spouse who can provide 24/7 supervision/min assist Pt/Family Agrees to Admission and willing to participate: Yes Program Orientation Provided & Reviewed with Pt/Caregiver Including Roles  & Responsibilities: Yes   Decrease burden of Care through IP rehab admission: no   Possible need for SNF placement upon discharge: Not anticipated.  Discharge plan: home with spouse who can provide 24/7 supervision to min assist.    Patient Condition: I have reviewed medical records from [facility], spoken with CM, and patient and spouse. I discussed via phone for inpatient rehabilitation assessment.  Patient will benefit from ongoing PT, OT, and SLP, can actively participate in 3 hours of therapy a day 5 days of the week, and can make measurable gains during the admission.   Patient will also benefit from the coordinated team approach during an Inpatient Acute Rehabilitation admission.  The patient will receive intensive therapy as well as Rehabilitation physician, nursing, social worker, and care management interventions.  Due to safety, skin/wound care, disease management, medication administration, pain management, and patient education the patient requires 24 hour a day rehabilitation nursing.  The patient is currently min to mod assist with mobility and basic ADLs.  Discharge setting and therapy post discharge at home with home health is anticipated.  Patient has agreed to participate in the Acute Inpatient Rehabilitation Program and will admit 12/03/23.   Preadmission Screen Completed By:  Reche FORBES Lowers, 12/02/2023 5:08 PM ______________________________________________________________________  Discussed status with Dr. Lorilee on 12/02/23 at 1700 and received approval for admission 8/16 when bed is available.   Admission Coordinator:  Caitlin E Warren, PT, time 1700 Date 12/02/23    Assessment/Plan: Diagnosis: CVA Does the need for close, 24 hr/day Medical supervision in concert with the patient's rehab needs make it unreasonable for this patient to be served in a less intensive setting? Yes Co-Morbidities requiring supervision/potential complications: HTN, DVT/PE, bladder cancer s/p nephrostomy Due to bladder management, bowel management, safety, skin/wound care, disease management, medication administration, pain management, and patient education, does the patient require 24 hr/day rehab nursing? Yes Does the patient require coordinated care of a physician, rehab nurse, PT, OT, and SLP to address physical and functional deficits in the context of the above medical diagnosis(es)? Yes Addressing deficits in the following areas: balance, endurance, locomotion, strength, transferring, bowel/bladder control, bathing, dressing, feeding, grooming, toileting, language,  and psychosocial support Can the patient actively participate in an intensive therapy program of at least 3 hrs of therapy 5 days a week? Yes The potential for patient to make measurable gains while on inpatient rehab is excellent Anticipated functional outcomes upon discharge from inpatient rehab: supervision PT, supervision OT, supervision SLP Estimated rehab length of stay to reach the above functional goals is: 8-12 days Anticipated discharge destination: Home 10. Overall Rehab/Functional Prognosis: excellent     MD Signature: Sven Lorilee, MD          Revision History

## 2023-12-03 NOTE — Progress Notes (Signed)
 Patient transferred to 89m 01 via wheelchair. NT notify call bell in reach. Wife is at the bedside.

## 2023-12-04 DIAGNOSIS — I10 Essential (primary) hypertension: Secondary | ICD-10-CM

## 2023-12-04 DIAGNOSIS — D649 Anemia, unspecified: Secondary | ICD-10-CM

## 2023-12-04 DIAGNOSIS — D6859 Other primary thrombophilia: Secondary | ICD-10-CM

## 2023-12-04 DIAGNOSIS — D696 Thrombocytopenia, unspecified: Secondary | ICD-10-CM

## 2023-12-04 LAB — CBC
HCT: 29.7 % — ABNORMAL LOW (ref 39.0–52.0)
Hemoglobin: 9.2 g/dL — ABNORMAL LOW (ref 13.0–17.0)
MCH: 30.1 pg (ref 26.0–34.0)
MCHC: 31 g/dL (ref 30.0–36.0)
MCV: 97.1 fL (ref 80.0–100.0)
Platelets: 138 K/uL — ABNORMAL LOW (ref 150–400)
RBC: 3.06 MIL/uL — ABNORMAL LOW (ref 4.22–5.81)
RDW: 18.9 % — ABNORMAL HIGH (ref 11.5–15.5)
WBC: 9.1 K/uL (ref 4.0–10.5)
nRBC: 0 % (ref 0.0–0.2)

## 2023-12-04 LAB — HEPARIN LEVEL (UNFRACTIONATED): Heparin Unfractionated: 0.29 [IU]/mL — ABNORMAL LOW (ref 0.30–0.70)

## 2023-12-04 LAB — PROTIME-INR
INR: 2.1 — ABNORMAL HIGH (ref 0.8–1.2)
Prothrombin Time: 24.4 s — ABNORMAL HIGH (ref 11.4–15.2)

## 2023-12-04 MED ORDER — PROCHLORPERAZINE MALEATE 5 MG PO TABS: 5.0000 mg | ORAL_TABLET | Freq: Four times a day (QID) | ORAL | Status: DC | PRN

## 2023-12-04 MED ORDER — ADULT MULTIVITAMIN W/MINERALS CH
1.0000 | ORAL_TABLET | Freq: Every day | ORAL | Status: DC
  Administered 2023-12-04 – 2023-12-19 (×15): 1 via ORAL
  Filled 2023-12-04 (×15): qty 1

## 2023-12-04 MED ORDER — MELATONIN 5 MG PO TABS: 5.0000 mg | ORAL_TABLET | Freq: Every evening | ORAL | Status: DC | PRN

## 2023-12-04 MED ORDER — THIAMINE MONONITRATE 100 MG PO TABS
100.0000 mg | ORAL_TABLET | Freq: Every day | ORAL | Status: DC
  Administered 2023-12-04 – 2023-12-07 (×4): 100 mg via ORAL
  Filled 2023-12-04 (×4): qty 1

## 2023-12-04 MED ORDER — FAMOTIDINE 20 MG PO TABS: 20.0000 mg | ORAL_TABLET | Freq: Every day | ORAL | Status: DC | PRN

## 2023-12-04 MED ORDER — ENSURE PLUS HIGH PROTEIN PO LIQD
237.0000 mL | Freq: Two times a day (BID) | ORAL | Status: DC
  Administered 2023-12-04 – 2023-12-19 (×23): 237 mL via ORAL

## 2023-12-04 MED ORDER — ALUM & MAG HYDROXIDE-SIMETH 200-200-20 MG/5ML PO SUSP: 30.0000 mL | ORAL | Status: DC | PRN

## 2023-12-04 MED ORDER — GUAIFENESIN-DM 100-10 MG/5ML PO SYRP: 10.0000 mL | ORAL_SOLUTION | Freq: Four times a day (QID) | ORAL | Status: DC | PRN

## 2023-12-04 MED ORDER — POLYETHYLENE GLYCOL 3350 17 G PO PACK
17.0000 g | PACK | Freq: Every day | ORAL | Status: AC | PRN
Start: 2023-12-04 — End: ?
  Administered 2023-12-14: 17 g via ORAL
  Filled 2023-12-04: qty 1

## 2023-12-04 MED ORDER — WARFARIN SODIUM 5 MG PO TABS
5.0000 mg | ORAL_TABLET | Freq: Once | ORAL | Status: AC
  Administered 2023-12-04: 5 mg via ORAL
  Filled 2023-12-04: qty 1

## 2023-12-04 MED ORDER — MIDODRINE HCL 5 MG PO TABS: 2.5000 mg | ORAL_TABLET | Freq: Every day | ORAL | Status: DC

## 2023-12-04 NOTE — Evaluation (Signed)
 Speech Language Pathology Assessment and Plan  Patient Details  Name: Allen Macdonald MRN: [id number] Date of Birth: 1951/03/09  SLP Diagnosis: Aphasia;Apraxia  Rehab Potential: Excellent ELOS: 12-14 days    Today's Date: 12/04/2023 SLP Individual Time: 9264-9169 SLP Individual Time Calculation (min): 55 min   Hospital Problem: Principal Problem:   CVA (cerebral vascular accident) [facility])  Past Medical History:  Past Medical History:  Diagnosis Date   Abnormal stress test minimal ischemia involving apex 05/01/2019   Bladder cancer (HCC)    Dilated cardiomyopathy (HCC)    Essential hypertension 12/18/2015   History of DVT (deep vein thrombosis) 04/22/2017   History of pulmonary embolism 12/18/2015   Laryngeal cancer (HCC) squamous cell 02/25/2020   PC (prostate cancer) (HCC) 08/28/2019   Past Surgical History:  Past Surgical History:  Procedure Laterality Date   CATARACT EXTRACTION Bilateral    INSERTION PROSTATE RADIATION SEED  2003   IR CT HEAD LTD  11/20/2023   IR PERCUTANEOUS ART THROMBECTOMY/INFUSION INTRACRANIAL INC DIAG ANGIO  11/20/2023   IR PERCUTANEOUS ART THROMBECTOMY/INFUSION INTRACRANIAL INC DIAG ANGIO  11/21/2023   IR PERCUTANEOUS ART THROMBECTOMY/INFUSION INTRACRANIAL INC DIAG ANGIO  11/29/2023   IR THROMBECT SEC MECH MOD SED  11/29/2023   IR US  GUIDE VASC ACCESS RIGHT  11/20/2023   IR US  GUIDE VASC ACCESS RIGHT  11/21/2023   IR US  GUIDE VASC ACCESS RIGHT  11/29/2023   PROSTATE SURGERY     RADIOLOGY WITH ANESTHESIA N/A 11/20/2023   Procedure: RADIOLOGY WITH ANESTHESIA;  Surgeon: Radiologist, Medication, MD;  Location: [facility] OR;  Service: Radiology;  Laterality: N/A;   RADIOLOGY WITH ANESTHESIA N/A 11/21/2023   Procedure: RADIOLOGY WITH ANESTHESIA;  Surgeon: Radiologist, Medication, MD;  Location: [facility] OR;  Service: Radiology;  Laterality: N/A;   RADIOLOGY WITH ANESTHESIA N/A 11/29/2023   Procedure: RADIOLOGY WITH ANESTHESIA;  Surgeon: Radiologist, Medication, MD;  Location: [facility]  OR;  Service: Radiology;  Laterality: N/A;   TONSILLECTOMY     Venacava Filter     WISDOM TOOTH EXTRACTION      Assessment / Plan / Recommendation Clinical Impression  [name] is a 73 year old man with a PMH of HTN, presenting with acute punctate infarcts in the right occipital and bilateral cerebellar hemispheres s/p mechanical thrombectomy of the left carotid T occlusion and basilar tip occlusion, with TICI 2 revascularization of both occlusions with mechanical thrombectomy. Cause of stroke is thought to be hypercoagulable state in the setting of advanced cancer with Eliquis  failure. He was recently in CIR and transferred to acute due to sudden onset of right facial droop. Increased right sided weakness and expressive and receptive aphasia. Currently with impaired cognition and right sided weakness.   Cognitive-linguistic evaluation completed. Pt presents with moderate expressive aphasia and mild receptive aphasia. He reported noticeable decline in memory however difficult to assess with word finding deficits. Pt presented with frequent hesitations, semantic paraphasias  and omissions of words during conversation tasks and when explaining background information. however with increased time he was able to effectively communicate his thoughts 65% of opportunities. He answered basic and complex yes/no questions with 95% accuracy. Visual Cues needed for following complex less familiar verbal commands during cognitive task. He was able to follow basic verbal commands independently. He named 9/10 words with confrontational naming task and produced semantic paraphasia/ descriptive new word for anchor by calling it a sea cross. He read hospital menu and communicated his food preferences with increased time and was able to self correct some verbal  errors. Attention, and basic problem solving appeared intact. Increased time and visual cues needed with visual problem solving task using blocks to create  designs. He will benefit from skilled SLP intervention during rehab stay to address verbal expression and mild cognitive deficits.    Skilled Therapeutic Interventions          Cognitive linguistic evaluation completed.   SLP Assessment  Patient will need skilled Speech Lanaguage Pathology Services during CIR admission    Recommendations  Medication Administration: Whole meds with liquid Patient destination: Home Follow up Recommendations: Outpatient SLP;Home Health SLP Equipment Recommended: None recommended by SLP    SLP Frequency 3 to 5 out of 7 days   SLP Duration  SLP Intensity  SLP Treatment/Interventions 12-14 days  Minumum of 1-2 x/day, 30 to 90 minutes  Multimodal communication approach;Speech/Language facilitation;Cueing hierarchy;Functional tasks;Therapeutic Activities;Patient/family education;Internal/external aids    Pain Pain Assessment Pain Scale: Faces Pain Score: 0-No pain Faces Pain Scale: No hurt  Prior Functioning Cognitive/Linguistic Baseline: Within functional limits Type of Home: House  Lives With: Spouse Available Help at Discharge: Family;Available 24 hours/day Education: ? 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 Overall Cognitive Status: Impaired/Different from baseline Arousal/Alertness: Awake/alert Orientation Level: Oriented X4 Year: 2025 Month: August Day of Week: Incorrect Attention: Selective Sustained Attention: Appears intact Selective Attention: Appears intact Memory: Impaired Memory Impairment: Storage deficit;Decreased recall of new information Decreased Short Term Memory: Verbal basic;Verbal complex Awareness: Appears intact Problem Solving: Impaired Problem Solving Impairment: Verbal complex;Functional basic Safety/Judgment: Appears intact  Comprehension Auditory Comprehension Overall Auditory Comprehension: Impaired Yes/No Questions: Within  Functional Limits Basic Biographical Questions: 76-100% accurate Basic Immediate Environment Questions: 75-100% accurate Complex Questions: 75-100% accurate Commands: Impaired One Step Basic Commands: 75-100% accurate Two Step Basic Commands: 75-100% accurate Multistep Basic Commands: 50-74% accurate Complex Commands: 50-74% accurate Conversation: Simple EffectiveTechniques: Extra processing time;Repetition Visual Recognition/Discrimination Discrimination: Not tested Reading Comprehension Reading Status: Impaired Word level: Within functional limits Sentence Level: Within functional limits Paragraph Level: Impaired Functional Environmental (signs, name badge): Not tested Expression Expression Primary Mode of Expression: Verbal Verbal Expression Overall Verbal Expression: Impaired Initiation: No impairment Automatic Speech: Name;Social Response Level of Generative/Spontaneous Verbalization: Sentence Repetition: Impaired Level of Impairment: Sentence level Naming: Impairment Responsive: 76-100% accurate Confrontation: Impaired Convergent: 50-74% accurate Divergent: 25-49% accurate Verbal Errors: Phonemic paraphasias;Semantic paraphasias;Perseveration;Aware of errors Pragmatics: No impairment Effective Techniques: Semantic cues;Articulatory cues;Phonemic cues Non-Verbal Means of Communication: Not applicable Written Expression Dominant Hand: Left Written Expression: Not tested Oral Motor Oral Motor/Sensory Function Overall Oral Motor/Sensory Function: Mild impairment Motor Speech Overall Motor Speech: Impaired Respiration: Within functional limits Phonation: Normal Resonance: Within functional limits Articulation: Impaired Level of Impairment: Sentence Intelligibility: Intelligible Motor Planning: Impaired Level of Impairment: Sentence Motor Speech Errors: Aware;Inconsistent Effective Techniques: Pause  Care Tool Care Tool Cognition Ability to hear (with  hearing aid or hearing appliances if normally used Ability to hear (with hearing aid or hearing appliances if normally used): 0. Adequate - no difficulty in normal conservation, social interaction, listening to TV   Expression of Ideas and Wants Expression of Ideas and Wants: 2. Frequent difficulty - frequently exhibits difficulty with expressing needs and ideas   Understanding Verbal and Non-Verbal Content Understanding Verbal and Non-Verbal Content: 3. Usually understands - understands most conversations, but misses some part/intent of message. Requires cues at times to understand  Memory/Recall Ability Memory/Recall Ability : Current season;That he or she is in a hospital/hospital unit  Short Term Goals: Week 1: SLP Short Term Goal 1 (Week 1): Pt will complete mildly complex naming tasks w/ minA SLP Short Term Goal 2 (Week 1): Pt will follow multi step commands with min A verbal and visual cues. SLP Short Term Goal 3 (Week 1): Pt will complete functional wirtten expression tasks with min A. SLP Short Term Goal 4 (Week 1): Pt will formulate sentences using word finding strategies to communticate his wants and needs with min a. SLP Short Term Goal 5 (Week 1): Pt will read and comprehend functional written information with min A.  Refer to Care Plan for Long Term Goals  Recommendations for other services: None   Discharge Criteria: Patient will be discharged from SLP if patient refuses treatment 3 consecutive times without medical reason, if treatment goals not met, if there is a change in medical status, if patient makes no progress towards goals or if patient is discharged from hospital.  The above assessment, treatment plan, treatment alternatives and goals were discussed and mutually agreed upon: by patient  Recardo A Jessina Marse 12/04/2023, 11:44 AM

## 2023-12-04 NOTE — Progress Notes (Signed)
 Inpatient Rehabilitation Admission Medication Review by a Pharmacist  A complete drug regimen review was completed for this patient to identify any potential clinically significant medication issues.  High Risk Drug Classes Is patient taking? Indication by Medication  Antipsychotic No   Anticoagulant Yes Warfarin and heparin : Hx DVT/PE, Stroke ppx  Antibiotic No   Opioid No   Antiplatelet No   Hypoglycemics/insulin No   Vasoactive Medication No   Chemotherapy No   Other Yes Lipitor : HLD     Type of Medication Issue Identified Description of Issue Recommendation(s)  Drug Interaction(s) (clinically significant)     Duplicate Therapy     Allergy      No Medication Administration End Date     Incorrect Dose     Additional Drug Therapy Needed     Significant med changes from prior encounter (inform family/care partners about these prior to discharge). STOP: apixaban   PTA meds not resumed: Coreg , amlodipine , bicalutamide , pepcid , Prilosec Restart or discontinue as appropriate. Communicate medication changes with patient/family at discharge  Other       Clinically significant medication issues were identified that warrant physician communication and completion of prescribed/recommended actions by midnight of the next day:  No  Name of provider notified for urgent issues identified: None  Provider Method of Notification: None   Pharmacist comments: None   Time spent performing this drug regimen review (minutes): 30   Thank you for allowing pharmacy to be a part of this patient's care.   Thank you. Olam Monte, PharmD    **Pharmacist phone directory can be found on [email or web address] listed under [facility]**

## 2023-12-04 NOTE — Plan of Care (Signed)
  Problem: RH Balance Goal: LTG Patient will maintain dynamic standing with ADLs (OT) Description: LTG:  Patient will maintain dynamic standing balance with assist during activities of daily living (OT)  Flowsheets (Taken 12/04/2023 1247) LTG: Pt will maintain dynamic standing balance during ADLs with: Supervision/Verbal cueing   Problem: RH Grooming Goal: LTG Patient will perform grooming w/assist,cues/equip (OT) Description: LTG: Patient will perform grooming with assist, with/without cues using equipment (OT) Flowsheets (Taken 12/04/2023 1247) LTG: Pt will perform grooming with assistance level of: Supervision/Verbal cueing   Problem: RH Bathing Goal: LTG Patient will bathe all body parts with assist levels (OT) Description: LTG: Patient will bathe all body parts with assist levels (OT) Flowsheets (Taken 12/04/2023 1247) LTG: Pt will perform bathing with assistance level/cueing: Supervision/Verbal cueing   Problem: RH Dressing Goal: LTG Patient will perform upper body dressing (OT) Description: LTG Patient will perform upper body dressing with assist, with/without cues (OT). Flowsheets (Taken 12/04/2023 1247) LTG: Pt will perform upper body dressing with assistance level of: Supervision/Verbal cueing Goal: LTG Patient will perform lower body dressing w/assist (OT) Description: LTG: Patient will perform lower body dressing with assist, with/without cues in positioning using equipment (OT) Flowsheets (Taken 12/04/2023 1247) LTG: Pt will perform lower body dressing with assistance level of: Supervision/Verbal cueing   Problem: RH Toileting Goal: LTG Patient will perform toileting task (3/3 steps) with assistance level (OT) Description: LTG: Patient will perform toileting task (3/3 steps) with assistance level (OT)  Flowsheets (Taken 12/04/2023 1247) LTG: Pt will perform toileting task (3/3 steps) with assistance level: Supervision/Verbal cueing   Problem: RH Toilet Transfers Goal: LTG  Patient will perform toilet transfers w/assist (OT) Description: LTG: Patient will perform toilet transfers with assist, with/without cues using equipment (OT) Flowsheets (Taken 12/04/2023 1247) LTG: Pt will perform toilet transfers with assistance level of: Supervision/Verbal cueing

## 2023-12-04 NOTE — Plan of Care (Signed)
  Problem: RH Comprehension Communication Goal: LTG Patient will comprehend basic/complex auditory (SLP) Description: LTG: Patient will comprehend basic/complex auditory information with cues (SLP). Flowsheets (Taken 12/04/2023 1201) LTG: Patient will comprehend: Complex auditory information LTG: Patient will comprehend auditory information with cueing (SLP): Supervision   Problem: RH Expression Communication Goal: LTG Patient will verbally express basic/complex needs(SLP) Description: LTG:  Patient will verbally express basic/complex needs, wants or ideas with cues  (SLP) Flowsheets (Taken 12/04/2023 1201) LTG: Patient will verbally express basic/complex needs, wants or ideas (SLP): Supervision Goal: LTG Patient will increase word finding of common (SLP) Description: LTG:  Patient will increase word finding of common objects/daily info/abstract thoughts with cues using compensatory strategies (SLP). Flowsheets (Taken 12/04/2023 1201) LTG: Patient will increase word finding of common (SLP): Supervision Patient will use compensatory strategies to increase word finding of:  Common objects  Daily info   Problem: RH Memory Goal: LTG Patient will follow step by step directions w/cues (SLP) Description: LTG: Patient will follow step by step directions with cues (SLP). Flowsheets (Taken 12/04/2023 1201) LTG: Patient will follow step by step directions: 3 steps LTG: Patient will follow step by step directions w/cues: Supervision Goal: LTG Patient will use memory compensatory aids to (SLP) Description: LTG:  Patient will use memory compensatory aids to recall biographical/new, daily complex information with cues (SLP) Flowsheets (Taken 12/04/2023 1201) LTG: Patient will use memory compensatory aids to (SLP): Modified Independent

## 2023-12-04 NOTE — Progress Notes (Addendum)
 PROGRESS NOTE   Subjective/Complaints:  Pt doing well, slept well, denies pain. LBM this morning, small. Nephrostomy tubes doing fine. No other complaints or concerns.    ROS: as per HPI. Denies CP, SOB, abd pain, N/V/D/C, or any other complaints at this time.    Objective:   No results found. Recent Labs    12/03/23 0502 12/04/23 0947  WBC 5.7 9.1  HGB 7.6* 9.2*  HCT 24.1* 29.7*  PLT 121* 138*   No results for input(s): NA, K, CL, CO2, GLUCOSE, BUN, CREATININE, CALCIUM  in the last 72 hours.      Intake/Output Summary (Last 24 hours) at 12/04/2023 1131 Last data filed at 12/04/2023 0700 Gross per 24 hour  Intake 352.64 ml  Output 250 ml  Net 102.64 ml        Physical Exam: Vital Signs Blood pressure 132/64, pulse 81, temperature 98 F (36.7 C), resp. rate 18, height 5' 10 (1.778 m), weight 103.2 kg, SpO2 100%.  Gen: awake and alert, well appearing, NAD, sitting in chair HEENT: oral mucosa pink and moist, NCAT Cardio: Reg rate and rhythm. No m/r/g appreciated, port R chest Chest: normal effort, CTAB no w/r/r Abd: soft, non-distended, nontender, +BS throughout, b/l nephrostomy tubes with yellow urine Ext: trace LE edema Psych: pleasant, though a bit flat Skin: intact over exposed surfaces, R hand IV c/d/I  PRIOR EXAMS: Neuro: Alert and oriented x2 (says year is 2022), right sided strength is 4/5, otherwise strength is intact  Assessment/Plan: 1. Functional deficits which require 3+ hours per day of interdisciplinary therapy in a comprehensive inpatient rehab setting. Physiatrist is providing close team supervision and 24 hour management of active medical problems listed below. Physiatrist and rehab team continue to assess barriers to discharge/monitor patient progress toward functional and medical goals  Care Tool:  Bathing              Bathing assist Assist Level: Moderate  Assistance - Patient 50 - 74%     Upper Body Dressing/Undressing Upper body dressing   What is the patient wearing?: Pull over shirt    Upper body assist Assist Level: Minimal Assistance - Patient > 75%    Lower Body Dressing/Undressing Lower body dressing            Lower body assist Assist for lower body dressing: Minimal Assistance - Patient > 75% (simulated)     Toileting Toileting    Toileting assist Assist for toileting: Moderate Assistance - Patient 50 - 74%     Transfers Chair/bed transfer  Transfers assist     Chair/bed transfer assist level: Minimal Assistance - Patient > 75%     Locomotion Ambulation   Ambulation assist              Walk 10 feet activity   Assist           Walk 50 feet activity   Assist           Walk 150 feet activity   Assist           Walk 10 feet on uneven surface  activity   Assist  Wheelchair     Assist               Wheelchair 50 feet with 2 turns activity    Assist            Wheelchair 150 feet activity     Assist          Blood pressure 132/64, pulse 81, temperature 98 F (36.7 C), resp. rate 18, height 5' 10 (1.778 m), weight 103.2 kg, SpO2 100%.  Medical Problem List and Plan: 1. Functional deficits secondary to left MCA scattered infarcts with left ICA and left MCA occlusion s/p IR with TICI2c followed by L ICA and basilar tip emboli s/p mechanical thrombectomy TIC3, likely due to hypercoagulable state in setting of cancer             -patient may shower, cover incision/nephrostomy tubes             -ELOS/Goals: 12-16 days, PT/OT sup to min A, SLP supervision             -Continue CIR 2.  Antithrombotics: -DVT/anticoagulation:  Mechanical: Sequential compression devices, below knee Bilateral lower extremities Pharmaceutical: previously Eliquis  5mg  BID, now on heparin  drip to transition to coumadin , Hgb trending down, repeat tomorrow and  FOBT ordered.   -12/04/23 Hgb up to 9.2, so improved greatly; FOBT neg             -antiplatelet therapy: N/A 3. Pain Management: Tylenol  prn  4. Mood/Behavior/Sleep: LCSW to follow for evaluation and support when available.              -antipsychotic agents: N/A  -melatonin PRN 5. Neuropsych/cognition: This patient may be capable of making decisions on his own behalf. 6. Skin/Wound Care: routine pressure relief measures    7. Fluids/Electrolytes/Nutrition: Monitor I/O  - Per speech past swallow study continue regular diet w/ Ensure and multivitamin supplements-- reordered from prior admission orders   8.  Bladder Cancer s/p nephrostomy tubes: stage IV, metastatic to the bone by MRI of the spine on 11/08/2023.  -Hx of prostate CA, on androgen therapy- leupron q6mos-- [facility] with [facility]On[facility] -prior UTI: Ceftriaxone  1g daily ended 8/10   9. AKI: Monitor BMP w/GFR  Cr 1.22--1.42--1.52>> 1.16 on 8/13. Monitor fluid intake and twice weekly labs   10.  Anemia: Hemoglobin stable post PRBC 2 units Hgb 9.9-- downtrended during readmission, 9.0--8.5--8.5--7.7--7.6 but up to 9.2 on 8/17.  -12/04/23 Hgb 9.2, FOBT neg. Monitor twice weekly.    11. HTN now with hypotension: hx of orthostasis which resolved after IV fluids--now holding home amlodipine  5 mg daily and Carvdilol 12.5 mg - resume as needed. Avoid hypotension             -12/04/23 blood pressures stable, continue holding meds, monitor   Vitals:   12/03/23 1300 12/03/23 1929 12/04/23 0616  BP: 129/73 131/65 132/64      12. DVT: hx of multiple BL DVT and PE had IVC filter placed 2014 --previously Eliquis  but now on heparin  bridging to coumadin   -US  LE positive for age indeterminate DVTs: RT - PopV and PTV LT - CFV, PFV, FV, PopV, Gastrocs  -Probable hypercoagulable state r/t carcinoma -appreciate pharmacy assistance with coumadin    13. HLD: LDL 112, goal < 70, atorvastatin  80mg  daily   14. Obesity: Educate on diet and weight loss to  promote overall health and mobility.  Body mass index is 33.06 kg/m.   15. Hx of laryngeal cancer s/p radiation therapy, voice  sounds mildly hoarse  16. GERD: reordered pepcid  20mg  daily PRN since it appears that's how he was using it; might also warrant Protonix ? Monitor.   17. Elevated LFTs: AST 67/ALT 68, alkphos/bili WNL on 8/9  -repeat LFTs on Monday   18. Thrombocytopenia: plt 138 on admission labs 8/17, had been 110s-140s, monitor twice weekly  LOS: 1 days A FACE TO FACE EVALUATION WAS PERFORMED  [address] 12/04/2023, 11:31 AM

## 2023-12-04 NOTE — Evaluation (Signed)
 Occupational Therapy Assessment and Plan  Patient Details  Name: Allen Macdonald MRN: [id number] Date of Birth: Feb 02, 1951  OT Diagnosis: abnormal posture, altered mental status, cognitive deficits, and hemiplegia affecting non-dominant side Rehab Potential: Rehab Potential (ACUTE ONLY): Good ELOS: 12-14 days   Today's Date: 12/04/2023 OT Individual Time: 1000-1114 OT Individual Time Calculation (min): 74 min     Hospital Problem: Principal Problem:   CVA (cerebral vascular accident) (HCC)   Past Medical History:  Past Medical History:  Diagnosis Date   Abnormal stress test minimal ischemia involving apex 05/01/2019   Bladder cancer (HCC)    Dilated cardiomyopathy (HCC)    Essential hypertension 12/18/2015   History of DVT (deep vein thrombosis) 04/22/2017   History of pulmonary embolism 12/18/2015   Laryngeal cancer (HCC) squamous cell 02/25/2020   PC (prostate cancer) (HCC) 08/28/2019   Past Surgical History:  Past Surgical History:  Procedure Laterality Date   CATARACT EXTRACTION Bilateral    INSERTION PROSTATE RADIATION SEED  2003   IR CT HEAD LTD  11/20/2023   IR PERCUTANEOUS ART THROMBECTOMY/INFUSION INTRACRANIAL INC DIAG ANGIO  11/20/2023   IR PERCUTANEOUS ART THROMBECTOMY/INFUSION INTRACRANIAL INC DIAG ANGIO  11/21/2023   IR PERCUTANEOUS ART THROMBECTOMY/INFUSION INTRACRANIAL INC DIAG ANGIO  11/29/2023   IR THROMBECT SEC MECH MOD SED  11/29/2023   IR US  GUIDE VASC ACCESS RIGHT  11/20/2023   IR US  GUIDE VASC ACCESS RIGHT  11/21/2023   IR US  GUIDE VASC ACCESS RIGHT  11/29/2023   PROSTATE SURGERY     RADIOLOGY WITH ANESTHESIA N/A 11/20/2023   Procedure: RADIOLOGY WITH ANESTHESIA;  Surgeon: Radiologist, Medication, MD;  Location: [facility] OR;  Service: Radiology;  Laterality: N/A;   RADIOLOGY WITH ANESTHESIA N/A 11/21/2023   Procedure: RADIOLOGY WITH ANESTHESIA;  Surgeon: Radiologist, Medication, MD;  Location: [facility] OR;  Service: Radiology;  Laterality: N/A;   RADIOLOGY WITH ANESTHESIA N/A  11/29/2023   Procedure: RADIOLOGY WITH ANESTHESIA;  Surgeon: Radiologist, Medication, MD;  Location: [facility] OR;  Service: Radiology;  Laterality: N/A;   TONSILLECTOMY     Venacava Filter     WISDOM TOOTH EXTRACTION      Assessment & Plan Clinical Impression: Allen Macdonald is a 73 year old man with a PMH of HTN, presenting with acute punctate infarcts in the right occipital and bilateral cerebellar hemispheres s/p mechanical thrombectomy of the left carotid T occlusion and basilar tip occlusion, with TICI 2 revascularization of both occlusions with mechanical thrombectomy. Cause of stroke is thought to be hypercoagulable state in the setting of advanced cancer with Eliquis  failure. He was recently in CIR and transferred to acute due to sudden onset of right facial droop. Increased right sided weakness and expressive and receptive aphasia. Currently with impaired cognition and right sided weakness. .  Patient transferred to CIR on 12/03/2023 .    Patient currently requires mod with basic self-care skills secondary to muscle weakness, decreased cardiorespiratoy endurance, impaired timing and sequencing, unbalanced muscle activation, decreased coordination, and decreased motor planning, decreased motor planning, decreased attention, decreased awareness, decreased problem solving, decreased safety awareness, decreased memory, and delayed processing, and decreased standing balance, decreased postural control, hemiplegia, and decreased balance strategies.  Prior to hospitalization, patient could complete ADLs with independent .  Patient will benefit from skilled intervention to decrease level of assist with basic self-care skills and increase independence with basic self-care skills prior to discharge home with care partner.  Anticipate patient will require 24 hour supervision and follow up home health.  OT -  End of Session Activity Tolerance: Tolerates 30+ min activity with multiple rests Endurance Deficit:  Yes Endurance Deficit Description: rest breaks for all mobility tasks OT Assessment Rehab Potential (ACUTE ONLY): Good OT Barriers to Discharge: None OT Patient demonstrates impairments in the following area(s): Balance;Cognition;Endurance;Motor;Perception;Safety OT Basic ADL's Functional Problem(s): Grooming;Bathing;Dressing;Toileting OT Advanced ADL's Functional Problem(s): None OT Transfers Functional Problem(s): Toilet;Tub/Shower OT Plan OT Intensity: Minimum of 1-2 x/day, 45 to 90 minutes OT Frequency: 5 out of 7 days OT Duration/Estimated Length of Stay: 12-14 days OT Treatment/Interventions: Disease mangement/prevention;Balance/vestibular training;Cognitive remediation/compensation;Community reintegration;Discharge planning;DME/adaptive equipment instruction;Functional mobility training;Neuromuscular re-education;Functional electrical stimulation;Pain management;Patient/family education;Psychosocial support;Self Care/advanced ADL retraining;Skin care/wound managment;Splinting/orthotics;Therapeutic Activities;Therapeutic Exercise;UE/LE Strength taining/ROM;UE/LE Coordination activities;Visual/perceptual remediation/compensation OT Self Feeding Anticipated Outcome(s): mod I OT Basic Self-Care Anticipated Outcome(s): SBA OT Toileting Anticipated Outcome(s): SBA OT Bathroom Transfers Anticipated Outcome(s): SBA OT Recommendation Recommendations for Other Services: Therapeutic Recreation consult Therapeutic Recreation Interventions: Pet therapy Patient destination: Home Follow Up Recommendations: Home health OT Equipment Recommended: To be determined Equipment Details: owns W/C, RW, [facility]   OT Evaluation Precautions/Restrictions  Precautions Precautions: Fall Precaution/Restrictions Comments: bilat nephrostomy bags Restrictions Weight Bearing Restrictions Per Provider Order: No General Chart Reviewed: Yes Response to Previous Treatment: Not applicable Family/Caregiver Present:  No Pain Pain Assessment Pain Scale: 0-10 Pain Score: 0-No pain Home Living/Prior Functioning Home Living Family/patient expects to be discharged to:: Private residence Living Arrangements: Spouse/significant other Available Help at Discharge: Family, Available 24 hours/day Type of Home: House Home Access: Stairs to enter Secretary/administrator of Steps: 3 Entrance Stairs-Rails: Left Home Layout: One level Bathroom Shower/Tub: Engineer, manufacturing systems: Handicapped height Bathroom Accessibility: Yes  Lives With: Spouse IADL History Homemaking Responsibilities: Yes Meal Prep Responsibility: Secondary Laundry Responsibility: Secondary Cleaning Responsibility: Secondary Bill Paying/Finance Responsibility: Secondary Shopping Responsibility: Secondary Child Care Responsibility: No Current License: Yes Mode of Transportation: Car, Other (comment) (and truck, takes this one most often) Occupation: Retired Prior Function Level of Independence: Independent with basic ADLs, Independent with gait, Independent with homemaking with ambulation, Independent with transfers  Able to Take Stairs?: Yes Driving: Yes Vocation: Retired Leisure: Hobbies-yes (Comment) (golfing) Vision Baseline Vision/History: 0 No visual deficits Wears Glasses: Reading only Ability to See in Adequate Light: 0 Adequate Patient Visual Report: No change from baseline Vision Assessment?: No apparent visual deficits Eye Alignment: Within Functional Limits Ocular Range of Motion: Within Functional Limits Alignment/Gaze Preference: Within Defined Limits Depth Perception: Undershoots;Overshoots Perception  Perception: Impaired Praxis Praxis: Impaired Praxis Impairment Details: Motor planning Praxis-Other Comments: uncoordinated movements with RUE, can fix with increased time and VC Cognition Cognition Overall Cognitive Status: Impaired/Different from baseline Arousal/Alertness: Awake/alert Orientation  Level: Person;Situation;Place Person: Oriented Place: Oriented Situation: Oriented Memory: Impaired Memory Impairment: Storage deficit;Decreased recall of new information;Decreased short term memory Decreased Short Term Memory: Verbal basic;Verbal complex Sustained Attention: Appears intact Selective Attention: Appears intact Awareness: Appears intact Problem Solving: Impaired Problem Solving Impairment: Verbal complex;Functional basic Brief Interview for Mental Status (BIMS) Repetition of Three Words (First Attempt): 3 Temporal Orientation: Year: Correct Temporal Orientation: Month: Accurate within 5 days Temporal Orientation: Day: Correct (correct day of the week, although did not know date) Recall: Sock: Yes, no cue required Recall: Blue: No, could not recall Recall: Bed: Yes, no cue required BIMS Summary Score: 13 Sensation Sensation Light Touch: Appears Intact Hot/Cold: Appears Intact Proprioception: Appears Intact Stereognosis: Not tested Coordination Gross Motor Movements are Fluid and Coordinated: No Fine Motor Movements are Fluid and Coordinated: No Coordination and Movement Description: coordination impaired secondary to mild  R hemi Motor  Motor Motor: Hemiplegia Motor - Skilled Clinical Observations: mild R hemiparesis and incoordination with R side  Trunk/Postural Assessment  Cervical Assessment Cervical Assessment: Within Functional Limits Thoracic Assessment Thoracic Assessment: Within Functional Limits Lumbar Assessment Lumbar Assessment: Exceptions to [facility] (posterior pelvic tilt) Postural Control Postural Control: Deficits on evaluation Righting Reactions: delayed and inadequate Protective Responses: delayed and inadequate Postural Limitations: decreased  Balance Balance Balance Assessed: Yes Static Sitting Balance Static Sitting - Balance Support: No upper extremity supported;Feet supported Static Sitting - Level of Assistance: 5: Stand by  assistance Dynamic Sitting Balance Dynamic Sitting - Balance Support: No upper extremity supported;Feet unsupported Dynamic Sitting - Level of Assistance: 5: Stand by assistance Static Standing Balance Static Standing - Balance Support: Bilateral upper extremity supported;During functional activity Static Standing - Level of Assistance: 4: Min assist Dynamic Standing Balance Dynamic Standing - Balance Support: Bilateral upper extremity supported;During functional activity Dynamic Standing - Level of Assistance: 4: Min assist Extremity/Trunk Assessment RUE Assessment RUE Assessment: Exceptions to [facility] Passive Range of Motion (PROM) Comments: only completed to ~90* shoulder flexion, WNL all other joints General Strength Comments: 3-/5 shoulder flexion and bicep, WNL all other joints, slight tremor LUE Assessment LUE Assessment: Within Functional Limits  Care Tool Care Tool Self Care Eating   Eating Assist Level: Set up assist    Oral Care    Oral Care Assist Level: Contact Guard/Toucning assist (in standing with increased time to manage containers with Rt side)    Bathing         Assist Level: Moderate Assistance - Patient 50 - 74%    Upper Body Dressing(including orthotics)   What is the patient wearing?: Pull over shirt   Assist Level: Minimal Assistance - Patient > 75%    Lower Body Dressing (excluding footwear)     Assist for lower body dressing: Minimal Assistance - Patient > 75% (simulated)    Putting on/Taking off footwear     Assist for footwear: Total Assistance - Patient < 25%       Care Tool Toileting Toileting activity   Assist for toileting: Moderate Assistance - Patient 50 - 74%     Care Tool Bed Mobility Roll left and right activity   Roll left and right assist level: Supervision/Verbal cueing    Sit to lying activity   Sit to lying assist level: Minimal Assistance - Patient > 75%    Lying to sitting on side of bed activity   Lying to sitting on  side of bed assist level: the ability to move from lying on the back to sitting on the side of the bed with no back support.: Minimal Assistance - Patient > 75%     Care Tool Transfers Sit to stand transfer   Sit to stand assist level: Moderate Assistance - Patient 50 - 74%    Chair/bed transfer   Chair/bed transfer assist level: Minimal Assistance - Patient > 75%     Toilet transfer   Assist Level: Minimal Assistance - Patient > 75%     Care Tool Cognition  Expression of Ideas and Wants Expression of Ideas and Wants: 2. Frequent difficulty - frequently exhibits difficulty with expressing needs and ideas  Understanding Verbal and Non-Verbal Content Understanding Verbal and Non-Verbal Content: 3. Usually understands - understands most conversations, but misses some part/intent of message. Requires cues at times to understand   Memory/Recall Ability Memory/Recall Ability : Current season;That he or she is in a hospital/hospital unit  Refer to Care Plan for Long Term Goals  SHORT TERM GOAL WEEK 1    Recommendations for other services: Neuropsych and Therapeutic Recreation  Pet therapy   Skilled Therapeutic Intervention ADL ADL Grooming: Contact guard (in standing at sink, able to manipulate containers and slightly squeeze toothpast onto toothbrush with Rt hand) Where Assessed-Grooming: Standing at sink Upper Body Dressing: Minimal assistance Lower Body Dressing: Minimal assistance (simulated) Where Assessed-Lower Body Dressing: Chair Tub/Shower Transfer: Not assessed Psychologist, counselling Transfer: Not assessed ADL Comments: Pt already dressed and washed up prior to eval, refused bathing/dressing tasks and with heparin  drip, unable to test UB dressing. Pt able to complete simulated LB tasks without AD and increased time Mobility  Transfers Sit to Stand: Moderate Assistance - Patient 50-74% Stand to Sit: Moderate Assistance - Patient 50-74%  1:1 evaluation and treatment session  initiated this date. OT roles, goals and purpose discussed with pt as well as therapy schedule. ADL completed this date with levels of assist listed above. Pt with noted aphasia, able to answer yes/no questions although once starting to converse, speaking becomes more difficult, VC to slow speech for word finding. Pt would benefit from skilled OT in IPR setting in order to maximize independence with ADLs upon D/C.   Pt completed the following exercise circuit in order to improve functional activity, strength and endurance to prepare for ADLs such as bathing. Pt completed the following exercises in seated position with no noted LOB/SOB and 3x10 repetitions on each exercise with emphasis on maintaining RUEfor increased strength/coordination with gross motor movements: -bicep curls -shoulder flexion   Discharge Criteria: Patient will be discharged from OT if patient refuses treatment 3 consecutive times without medical reason, if treatment goals not met, if there is a change in medical status, if patient makes no progress towards goals or if patient is discharged from hospital.  The above assessment, treatment plan, treatment alternatives and goals were discussed and mutually agreed upon: by patient  Camie Hoe, OTD, OTR/L 12/04/2023, 11:22 AM

## 2023-12-04 NOTE — Progress Notes (Signed)
 PHARMACY - ANTICOAGULATION CONSULT NOTE  Pharmacy Consult:  Heparin  to Warfarin Indication: History of VTE with recurrent CVAs  Allergies  Allergen Reactions   Bee Venom Anaphylaxis    Patient Measurements: Height: 5' 10 (177.8 cm) Weight: 103.2 kg (227 lb 8.2 oz) IBW/kg (Calculated) : 73  Vital Signs: Temp: 98 F (36.7 C) (08/17 0616) BP: 132/64 (08/17 0616) Pulse Rate: 81 (08/17 0616)  Labs: Recent Labs    12/01/23 1752 12/02/23 0350 12/02/23 0350 12/03/23 0502 12/04/23 0947  HGB  --  7.7*   < > 7.6* 9.2*  HCT  --  24.1*  --  24.1* 29.7*  PLT  --  113*  --  121* 138*  APTT 59* 89*  --   --   --   LABPROT  --  16.4*  --  18.6* 24.4*  INR  --  1.3*  --  1.5* 2.1*  HEPARINUNFRC  --  0.44  --  0.41 0.29*   < > = values in this interval not displayed.    Estimated Creatinine Clearance: 66.2 mL/min (by C-G formula based on SCr of 1.16 mg/dL).  Assessment: 7 YOM with 2 recent L MCA strokes s/p MT each time.  Patient went to Rehab and was receiving Eliquis , last dose on 8/12 at 0847.  Patient returned to the inpatient side with recurrent CVA on 11/29/23.  MRI shows petechial hemorrhage with new/increased scattered punctate infarcts and new microhemorrhages.  Pharmacy consulted to dose IV heparin  bridge to Coumadin .  Stopping Eliquis .  INR 2.1 today  Goal of Therapy:  INR 2 to 3 Monitor platelets by anticoagulation protocol: Yes  Plan:  DC heparin  and heparin  labs Warfarin 5 mg po x 1 today Daily INR  Thank you for allowing pharmacy to be involved with this patient's care. Olam Monte, PharmD  12/04/2023 12:25 PM

## 2023-12-04 NOTE — Plan of Care (Signed)
  Problem: RH Balance Goal: LTG Patient will maintain dynamic sitting balance (PT) Description: LTG:  Patient will maintain dynamic sitting balance with assistance during mobility activities (PT) Flowsheets (Taken 12/04/2023 1550) LTG: Pt will maintain dynamic sitting balance during mobility activities with:: Supervision/Verbal cueing Goal: LTG Patient will maintain dynamic standing balance (PT) Description: LTG:  Patient will maintain dynamic standing balance with assistance during mobility activities (PT) Flowsheets (Taken 12/04/2023 1550) LTG: Pt will maintain dynamic standing balance during mobility activities with:: Supervision/Verbal cueing   Problem: Sit to Stand Goal: LTG:  Patient will perform sit to stand with assistance level (PT) Description: LTG:  Patient will perform sit to stand with assistance level (PT) Flowsheets (Taken 12/04/2023 1550) LTG: PT will perform sit to stand in preparation for functional mobility with assistance level: Supervision/Verbal cueing   Problem: RH Bed Mobility Goal: LTG Patient will perform bed mobility with assist (PT) Description: LTG: Patient will perform bed mobility with assistance, with/without cues (PT). Flowsheets (Taken 12/04/2023 1550) LTG: Pt will perform bed mobility with assistance level of: Supervision/Verbal cueing   Problem: RH Bed to Chair Transfers Goal: LTG Patient will perform bed/chair transfers w/assist (PT) Description: LTG: Patient will perform bed to chair transfers with assistance (PT). Flowsheets (Taken 12/04/2023 1550) LTG: Pt will perform Bed to Chair Transfers with assistance level: Supervision/Verbal cueing   Problem: RH Car Transfers Goal: LTG Patient will perform car transfers with assist (PT) Description: LTG: Patient will perform car transfers with assistance (PT). Flowsheets (Taken 12/04/2023 1550) LTG: Pt will perform car transfers with assist:: Supervision/Verbal cueing   Problem: RH Ambulation Goal: LTG  Patient will ambulate in controlled environment (PT) Description: LTG: Patient will ambulate in a controlled environment, # of feet with assistance (PT). Flowsheets (Taken 12/04/2023 1550) LTG: Pt will ambulate in controlled environ  assist needed:: Supervision/Verbal cueing LTG: Ambulation distance in controlled environment: 150' Goal: LTG Patient will ambulate in home environment (PT) Description: LTG: Patient will ambulate in home environment, # of feet with assistance (PT). Flowsheets (Taken 12/04/2023 1550) LTG: Pt will ambulate in home environ  assist needed:: Supervision/Verbal cueing LTG: Ambulation distance in home environment: 50'   Problem: RH Stairs Goal: LTG Patient will ambulate up and down stairs w/assist (PT) Description: LTG: Patient will ambulate up and down # of stairs with assistance (PT) Flowsheets (Taken 12/04/2023 1550) LTG: Pt will ambulate up/down stairs assist needed:: Supervision/Verbal cueing LTG: Pt will  ambulate up and down number of stairs: 4 steps to simulate home set up

## 2023-12-04 NOTE — Progress Notes (Signed)
 11/29/2023 Inpatient Rehabilitation Discharge Medication Review by a Pharmacist --(Note entry is for discharge from [facility] on 11/29/2023)  -A complete drug regimen review was completed for this patient to identify any potential clinically significant medication issues.  High Risk Drug Classes Is patient taking? Indication by Medication  Antipsychotic No   Anticoagulant Yes Apixaban - acute CVA, h/o acute DVT/PE  Antibiotic No   Opioid No   Antiplatelet No   Hypoglycemics/insulin No   Vasoactive Medication Yes Amlodipine  - HTN  Chemotherapy No   Other Yes Atorvastatin  - HLD Multivitamin - supplement     Type of Medication Issue Identified Description of Issue Recommendation(s)  Drug Interaction(s) (clinically significant)     Duplicate Therapy     Allergy      No Medication Administration End Date     Incorrect Dose     Additional Drug Therapy Needed     Significant med changes from prior encounter (inform family/care partners about these prior to discharge).    Other       Clinically significant medication issues were identified that warrant physician communication and completion of prescribed/recommended actions by midnight of the next day:  No  Name of provider notified for urgent issues identified:   Provider Method of Notification:     Pharmacist comments:   Time spent performing this drug regimen review (minutes):  12    Allen Macdonald, RPh Clinical Pharmacist 12/04/2023 11:46 PM

## 2023-12-04 NOTE — Patient Instructions (Signed)
 I

## 2023-12-04 NOTE — Evaluation (Signed)
 Physical Therapy Assessment and Plan  Patient Details  Name: Bodi Palmeri MRN: [id number] Date of Birth: December 06, 1950  PT Diagnosis: Abnormal posture, Abnormality of gait, Cognitive deficits, Difficulty walking, Dizziness and giddiness, Edema, Impaired cognition, and Muscle weakness Rehab Potential: Fair ELOS: 12-14 days   Today's Date: 12/04/2023 PT Individual Time: 1300-1413 PT Individual Time Calculation (min): 73 min    Hospital Problem: Principal Problem:   CVA (cerebral vascular accident) [facility])   Past Medical History:  Past Medical History:  Diagnosis Date   Abnormal stress test minimal ischemia involving apex 05/01/2019   Bladder cancer (HCC)    Dilated cardiomyopathy (HCC)    Essential hypertension 12/18/2015   History of DVT (deep vein thrombosis) 04/22/2017   History of pulmonary embolism 12/18/2015   Laryngeal cancer (HCC) squamous cell 02/25/2020   PC (prostate cancer) (HCC) 08/28/2019   Past Surgical History:  Past Surgical History:  Procedure Laterality Date   CATARACT EXTRACTION Bilateral    INSERTION PROSTATE RADIATION SEED  2003   IR CT HEAD LTD  11/20/2023   IR PERCUTANEOUS ART THROMBECTOMY/INFUSION INTRACRANIAL INC DIAG ANGIO  11/20/2023   IR PERCUTANEOUS ART THROMBECTOMY/INFUSION INTRACRANIAL INC DIAG ANGIO  11/21/2023   IR PERCUTANEOUS ART THROMBECTOMY/INFUSION INTRACRANIAL INC DIAG ANGIO  11/29/2023   IR THROMBECT SEC MECH MOD SED  11/29/2023   IR US  GUIDE VASC ACCESS RIGHT  11/20/2023   IR US  GUIDE VASC ACCESS RIGHT  11/21/2023   IR US  GUIDE VASC ACCESS RIGHT  11/29/2023   PROSTATE SURGERY     RADIOLOGY WITH ANESTHESIA N/A 11/20/2023   Procedure: RADIOLOGY WITH ANESTHESIA;  Surgeon: Radiologist, Medication, MD;  Location: [facility] OR;  Service: Radiology;  Laterality: N/A;   RADIOLOGY WITH ANESTHESIA N/A 11/21/2023   Procedure: RADIOLOGY WITH ANESTHESIA;  Surgeon: Radiologist, Medication, MD;  Location: [facility] OR;  Service: Radiology;  Laterality: N/A;   RADIOLOGY WITH  ANESTHESIA N/A 11/29/2023   Procedure: RADIOLOGY WITH ANESTHESIA;  Surgeon: Radiologist, Medication, MD;  Location: [facility] OR;  Service: Radiology;  Laterality: N/A;   TONSILLECTOMY     Venacava Filter     WISDOM TOOTH EXTRACTION      Assessment & Plan Clinical Impression: Patient is a 73 y.o. year old male with  a PMH of HTN, presenting with acute punctate infarcts in the right occipital and bilateral cerebellar hemispheres s/p mechanical thrombectomy of the left carotid T occlusion and basilar tip occlusion, with TICI 2 revascularization of both occlusions with mechanical thrombectomy. Cause of stroke is thought to be hypercoagulable state in the setting of advanced cancer with Eliquis  failure. He was recently in CIR and transferred to acute due to sudden onset of right facial droop. Increased right sided weakness and expressive and receptive aphasia. Currently with impaired cognition and right sided weakness.    Patient currently requires mod with mobility secondary to muscle weakness, decreased cardiorespiratoy endurance, decreased coordination and decreased motor planning, decreased problem solving and decreased memory, and decreased standing balance, decreased postural control, hemiplegia, and decreased balance strategies.  Prior to hospitalization, patient was modified independent  with mobility and lived with Spouse (wife-karen) in a House home.  Home access is 3Stairs to enter.  Patient will benefit from skilled PT intervention to maximize safe functional mobility, minimize fall risk, and decrease caregiver burden for planned discharge home with 24 hour supervision.  Anticipate patient will benefit from follow up HH at discharge.  PT - End of Session Activity Tolerance: Tolerates 30+ min activity with multiple rests Endurance Deficit: Yes Endurance  Deficit Description: rest breaks for all mobility tasks PT Assessment Rehab Potential (ACUTE/IP ONLY): Fair PT Barriers to Discharge:  Inaccessible home environment;Decreased caregiver support;Home environment access/layout;Pending chemo/radiation;Lack of/limited family support PT Barriers to Discharge Comments: 3 steps unilateral handrail, limited caregiver support (wife is visually impaired), orthostatic hypotension PT Patient demonstrates impairments in the following area(s): Balance;Behavior;Edema;Endurance;Motor;Pain;Perception;Safety PT Transfers Functional Problem(s): Bed Mobility;Bed to Chair;Car;Furniture PT Locomotion Functional Problem(s): Ambulation;Wheelchair Mobility;Stairs PT Plan PT Intensity: Minimum of 1-2 x/day ,45 to 90 minutes PT Frequency: 5 out of 7 days PT Duration Estimated Length of Stay: 12-14 days PT Treatment/Interventions: Ambulation/gait training;Discharge planning;Functional mobility training;Psychosocial support;Therapeutic Activities;Visual/perceptual remediation/compensation;Balance/vestibular training;Neuromuscular re-education;Therapeutic Exercise;Disease management/prevention;Cognitive remediation/compensation;DME/adaptive equipment instruction;UE/LE Strength taining/ROM;Splinting/orthotics;Community reintegration;Patient/family education;Stair training;UE/LE Coordination activities;Functional electrical stimulation;Pain management;Skin care/wound management PT Transfers Anticipated Outcome(s): supervision PT Locomotion Anticipated Outcome(s): supervision PT Recommendation Recommendations for Other Services: Neuropsych consult Follow Up Recommendations: Home health PT Patient destination: Home Equipment Recommended: To be determined   PT Evaluation Precautions/Restrictions Precautions Precautions: Fall Recall of Precautions/Restrictions: Intact Precaution/Restrictions Comments: bilat nephrostomy bags, fall, aphasia, mild R hemi Restrictions Weight Bearing Restrictions Per Provider Order: No Pain Interference Pain Interference Pain Effect on Sleep: 1. Rarely or not at all Pain  Interference with Therapy Activities: 2. Occasionally Pain Interference with Day-to-Day Activities: 1. Rarely or not at all Home Living/Prior Functioning Home Living Available Help at Discharge: Family;Available 24 hours/day (pt wife has vision deficits-expressed interest in personal care attendant/sitter) Type of Home: House Home Access: Stairs to enter [employer] of Steps: 3 Entrance Stairs-Rails: Right (R ascending handrail, L descending handrail) Home Layout: One level Bathroom Shower/Tub: Tub/shower unit;Walk-in shower (pt wife endorses tub shower and walk in shower, walk in shower has built in seat) Firefighter: Handicapped height (pt has BSC) Bathroom Accessibility: Yes  Lives With: Spouse (wife-karen) Prior Function Level of Independence: Independent with basic ADLs;Independent with gait;Independent with homemaking with ambulation;Independent with transfers  Able to Take Stairs?: Yes Driving: Yes Vocation: Retired Vision/Perception  Vision - History Ability to See in Adequate Light: 0 Adequate Perception Perception: Impaired Preception Impairment Details: Spatial orientation;Inattention/Neglect Perception-Other Comments: mild R inattetion Praxis Praxis: Impaired Praxis Impairment Details: Motor planning Praxis-Other Comments: uncoordinated movements with R UE, can fix with increased time and VC  Cognition Overall Cognitive Status: Impaired/Different from baseline Arousal/Alertness: Awake/alert Orientation Level: Oriented X4 Year: 2025 Month: August Day of Week: Correct Attention: Selective Sustained Attention: Appears intact Selective Attention: Appears intact Memory: Impaired Memory Impairment: Storage deficit;Decreased recall of new information Decreased Short Term Memory: Verbal basic;Verbal complex Awareness: Appears intact Problem Solving: Impaired Problem Solving Impairment: Verbal complex;Functional basic Safety/Judgment: Appears  intact Comments: aphasia Sensation Sensation Light Touch: Appears Intact Hot/Cold: Appears Intact Proprioception: Appears Intact Stereognosis: Not tested Additional Comments: light touch in tact B LE dermatomes, pt denies any N&T Coordination Gross Motor Movements are Fluid and Coordinated: No Fine Motor Movements are Fluid and Coordinated: No Coordination and Movement Description: coordination impaired secondary to mild R hemi Motor  Motor Motor: Hemiplegia Motor - Skilled Clinical Observations: mild R hemiparesis and incoordination with R side  Trunk/Postural Assessment  Cervical Assessment Cervical Assessment: Within Functional Limits Thoracic Assessment Thoracic Assessment: Within Functional Limits Lumbar Assessment Lumbar Assessment: Exceptions to [facility] [address] (posterior pelvic tilt) Postural Control Postural Control: Deficits on evaluation Righting Reactions: delayed and inadequate Protective Responses: delayed and inadequate Postural Limitations: decreased  Balance Balance Balance Assessed: Yes Static Sitting Balance Static Sitting - Balance Support: No upper extremity supported;Feet supported Static Sitting - Level of Assistance: 5: Stand by  assistance Dynamic Sitting Balance Dynamic Sitting - Balance Support: No upper extremity supported;Feet unsupported Dynamic Sitting - Level of Assistance: 5: Stand by assistance Static Standing Balance Static Standing - Balance Support: Bilateral upper extremity supported;During functional activity Static Standing - Level of Assistance: 4: Min assist Dynamic Standing Balance Dynamic Standing - Balance Support: Bilateral upper extremity supported;During functional activity Dynamic Standing - Level of Assistance: 4: Min assist Extremity Assessment  RLE Assessment RLE Assessment: Exceptions to [facility] General Strength Comments: grossly 3/5 LLE Assessment LLE Assessment: Within Functional Limits  Care Tool Care Tool Bed Mobility Roll  left and right activity   Roll left and right assist level: Supervision/Verbal cueing    Sit to lying activity   Sit to lying assist level: Minimal Assistance - Patient > 75%    Lying to sitting on side of bed activity   Lying to sitting on side of bed assist level: the ability to move from lying on the back to sitting on the side of the bed with no back support.: Minimal Assistance - Patient > 75%     Care Tool Transfers Sit to stand transfer   Sit to stand assist level: Moderate Assistance - Patient 50 - 74%    Chair/bed transfer   Chair/bed transfer assist level: Moderate Assistance - Patient 50 - 74%    Car transfer    Safety/medical (fatigue)       Care Tool Locomotion Ambulation   Assist level: Minimal Assistance - Patient > 75% Assistive device: Walker-rolling Max distance: 80  Walk 10 feet activity   Assist level: Minimal Assistance - Patient > 75% Assistive device: Walker-rolling   Walk 50 feet with 2 turns activity   Assist level: Minimal Assistance - Patient > 75% Assistive device: Walker-rolling  Walk 150 feet activity Walk 150 feet activity did not occur: Safety/medical concerns (fatigue)      Walk 10 feet on uneven surfaces activity Walk 10 feet on uneven surfaces activity did not occur: Safety/medical concerns (fatigue)      Stairs Stair activity did not occur: Safety/medical concerns (fatigue)        Walk up/down 1 step activity Walk up/down 1 step or curb (drop down) activity did not occur: Safety/medical concerns (fatigue)      Walk up/down 4 steps activity Walk up/down 4 steps activity did not occur: Safety/medical concerns      Walk up/down 12 steps activity Walk up/down 12 steps activity did not occur: Safety/medical concerns      Pick up small objects from floor   Pick up small object from the floor assist level: Total Assistance - Patient < 25%    Wheelchair Is the patient using a wheelchair?: Yes Type of Wheelchair: Manual   Wheelchair  assist level: Dependent - Patient 0%    Wheel 50 feet with 2 turns activity   Assist Level: Dependent - Patient 0%  Wheel 150 feet activity        Refer to Care Plan for Long Term Goals  SHORT TERM GOAL WEEK 1 PT Short Term Goal 1 (Week 1): Pt will complete transfers with CGA consistently PT Short Term Goal 2 (Week 1): Pt will complete up/down 4 steps with CGA and BHRs PT Short Term Goal 3 (Week 1): Pt will ambulate 150' with RW CGA  Recommendations for other services: Neuropsych  Skilled Therapeutic Intervention Mobility   Locomotion  Gait Ambulation: Yes Gait Assistance: Minimal Assistance - Patient > 75% Gait Distance (Feet): 80 Feet Assistive device: Rolling  walker Gait Assistance Details: Verbal cues for safe use of DME/AE;Verbal cues for precautions/safety;Verbal cues for gait pattern Gait Assistance Details: decreased Gait Gait: Yes Gait Pattern: Impaired Gait Pattern: Trunk flexed;Wide base of support;Poor foot clearance - left;Poor foot clearance - right;Decreased dorsiflexion - right;Right hip hike Gait velocity: decr Stairs / Additional Locomotion Stairs: No Wheelchair Mobility Wheelchair Mobility: Yes Wheelchair Assistance: Dependent - Patient 0%   Discharge Criteria: Patient will be discharged from PT if patient refuses treatment 3 consecutive times without medical reason, if treatment goals not met, if there is a change in medical status, if patient makes no progress towards goals or if patient is discharged from hospital.  The above assessment, treatment plan, treatment alternatives and goals were discussed and mutually agreed upon: by patient and by family   Evaluation completed (see details above and below) with education on PT POC and goals and individual treatment initiated with focus on education.   Bed mobility: not assessed Sit to stand: mod A with RW  Stand pivot transfer: mod A Gait x 80 feet with RW and min A, decreased R LE foot clearance, R  LE foot drag  Vitals assessed   Seated: BP 105/57 HR 87 seated Standing: BP 82/60 HR 103-pt reports dizziness-- notified MD and nurse, MD reports ted hose contraindicated at this time 2/2 DVT.  Post ambulation: BP 124/98 HR 89 while seated in WC   Pt overall very fatigued, education provided for pursed lip breathing for management of SOB/fatigue and energy conservation technique.   Pt seated in recliner with all needs within reach and seatbelt alarm on.   [facility], [place], DPT  12/04/2023, 3:40 PM

## 2023-12-05 DIAGNOSIS — R7401 Elevation of levels of liver transaminase levels: Secondary | ICD-10-CM

## 2023-12-05 DIAGNOSIS — C679 Malignant neoplasm of bladder, unspecified: Secondary | ICD-10-CM

## 2023-12-05 DIAGNOSIS — D62 Acute posthemorrhagic anemia: Secondary | ICD-10-CM

## 2023-12-05 LAB — PROTIME-INR
INR: 4.9 (ref 0.8–1.2)
INR: 6.9 (ref 0.8–1.2)
Prothrombin Time: 47.4 s — ABNORMAL HIGH (ref 11.4–15.2)
Prothrombin Time: 62.1 s — ABNORMAL HIGH (ref 11.4–15.2)

## 2023-12-05 LAB — HEPATIC FUNCTION PANEL
ALT: 52 U/L — ABNORMAL HIGH (ref 0–44)
AST: 42 U/L — ABNORMAL HIGH (ref 15–41)
Albumin: 2.7 g/dL — ABNORMAL LOW (ref 3.5–5.0)
Alkaline Phosphatase: 158 U/L — ABNORMAL HIGH (ref 38–126)
Bilirubin, Direct: 0.1 mg/dL (ref 0.0–0.2)
Indirect Bilirubin: 0.7 mg/dL (ref 0.3–0.9)
Total Bilirubin: 0.8 mg/dL (ref 0.0–1.2)
Total Protein: 5.7 g/dL — ABNORMAL LOW (ref 6.5–8.1)

## 2023-12-05 LAB — CBC
HCT: 26.7 % — ABNORMAL LOW (ref 39.0–52.0)
Hemoglobin: 8.3 g/dL — ABNORMAL LOW (ref 13.0–17.0)
MCH: 29.7 pg (ref 26.0–34.0)
MCHC: 31.1 g/dL (ref 30.0–36.0)
MCV: 95.7 fL (ref 80.0–100.0)
Platelets: 116 K/uL — ABNORMAL LOW (ref 150–400)
RBC: 2.79 MIL/uL — ABNORMAL LOW (ref 4.22–5.81)
RDW: 19.1 % — ABNORMAL HIGH (ref 11.5–15.5)
WBC: 7.6 K/uL (ref 4.0–10.5)
nRBC: 0 % (ref 0.0–0.2)

## 2023-12-05 LAB — BASIC METABOLIC PANEL WITH GFR
Anion gap: 11 (ref 5–15)
BUN: 23 mg/dL (ref 8–23)
CO2: 22 mmol/L (ref 22–32)
Calcium: 8.5 mg/dL — ABNORMAL LOW (ref 8.9–10.3)
Chloride: 107 mmol/L (ref 98–111)
Creatinine, Ser: 1.3 mg/dL — ABNORMAL HIGH (ref 0.61–1.24)
GFR, Estimated: 57 mL/min — ABNORMAL LOW (ref >60)
Glucose, Bld: 96 mg/dL (ref 70–99)
Potassium: 3.8 mmol/L (ref 3.5–5.1)
Sodium: 140 mmol/L (ref 135–145)

## 2023-12-05 MED ORDER — CHLORHEXIDINE GLUCONATE CLOTH 2 % EX PADS
6.0000 | MEDICATED_PAD | Freq: Two times a day (BID) | CUTANEOUS | Status: DC
  Administered 2023-12-05 – 2023-12-19 (×28): 6 via TOPICAL

## 2023-12-05 MED ORDER — PHYTONADIONE 5 MG PO TABS
2.5000 mg | ORAL_TABLET | Freq: Once | ORAL | Status: AC
  Administered 2023-12-05: 2.5 mg via ORAL
  Filled 2023-12-05: qty 1

## 2023-12-05 NOTE — Progress Notes (Signed)
 Speech Language Pathology Daily Session Note  Patient Details  Name: Allen Macdonald MRN: [id number] Date of Birth: 07/05/1950  Today's Date: 12/05/2023 SLP Individual Time: 8764-8664 SLP Individual Time Calculation (min): 60 min  Short Term Goals: Week 1: SLP Short Term Goal 1 (Week 1): Pt will complete mildly complex naming tasks w/ minA SLP Short Term Goal 2 (Week 1): Pt will follow multi step commands with min A verbal and visual cues. SLP Short Term Goal 3 (Week 1): Pt will complete functional wirtten expression tasks with min A. SLP Short Term Goal 4 (Week 1): Pt will formulate sentences using word finding strategies to communticate his wants and needs with min a. SLP Short Term Goal 5 (Week 1): Pt will read and comprehend functional written information with min A.  Skilled Therapeutic Interventions: Skilled therapy session focused on communication goals. SLP facilitated session by encouraging patient to complete automatic speech tasks. Patient named numbers 1-10 and days of the week independently and months of the year given x1 verbal cue. Patient required min-modA to name holidays associated with each month and their purpose. SLP then prompted patient to complete sentence creation activity in which he required mina for specificity and reducing filler language. Patient left in chair with alarm set and call bell in reach. Continue POC.   Pain None reported   Therapy/Group: Individual Therapy  Gertha Lichtenberg M.A., CCC-SLP 12/05/2023, 7:49 AM

## 2023-12-05 NOTE — Progress Notes (Signed)
 Patient ID: Allen Macdonald, male   DOB: 1950-11-24, 73 y.o.   MRN: [id number] Met with the patient as a follow up to new Left basal ganglia thrombus; CVA/thrombectomy with right side weakness, dysarthria, ataxia and aphasia. Recognizes when he has expressive difficulties and clarifies and repeats responses.  Concerned about [place] city.  Discussed team conference and ELOS.  Continue to follow along to address educational needs to facilitate preparation for discharge \. Fredericka Barnie NOVAK

## 2023-12-05 NOTE — Progress Notes (Signed)
 Speech Language Pathology Daily Session Note  Patient Details  Name: Allen Macdonald MRN: [id number] Date of Birth: [birth year]  Today's Date: 12/05/2023 SLP Individual Time: 1010-1110 SLP Individual Time Calculation (min): 60 min  Short Term Goals: Week 1: SLP Short Term Goal 1 (Week 1): Pt will complete mildly complex naming tasks w/ minA SLP Short Term Goal 2 (Week 1): Pt will follow multi step commands with min A verbal and visual cues. SLP Short Term Goal 3 (Week 1): Pt will complete functional wirtten expression tasks with min A. SLP Short Term Goal 4 (Week 1): Pt will formulate sentences using word finding strategies to communticate his wants and needs with min a. SLP Short Term Goal 5 (Week 1): Pt will read and comprehend functional written information with min A.  Skilled Therapeutic Interventions:   Pt greeted at bedside. He was awake in his recliner upon SLP arrival, agreeable to tx tasks targeting communication goals. During initial conversation, he was able to provide broad details re recent events, including CVA and re admission to ICU last week w/ modA for mildly specific word finding and sentence formulation. He completed automatic naming tasks (opposite completion) w/ only supervisionA to prime for additional naming tasks. He then benefited from minA to complete a simple responsive naming task. Increased cueing required during object description task, as he required maxA. However, increased cueing anticipated d/t initial introduction to task of this complexity. Errors typically comprised of semantic paraphasias, neologisms, or anomic responses. Anticipate apraxia continues to negatively impact accuracy of verbal expression as well. Reading comprehension/written expression task introduced as well. He was able to sort single words into the appropriate category independently. Only minA required to copy words overall and short words (5 letter or less) were copied independently. Of note,  pt did report spelling difficulties at baseline. At the end of tx tasks, he was left in his recliner w/ the chair alarm set and call light within reach. Recommend cont ST per POC.   Pain  No pain reported  Therapy/Group: Individual Therapy  [name] 12/05/2023, 11:08 AM

## 2023-12-05 NOTE — Progress Notes (Signed)
 PHARMACY - ANTICOAGULATION CONSULT NOTE  Pharmacy Consult:  Heparin  to Warfarin Indication: History of VTE with recurrent CVAs  Allergies  Allergen Reactions   Bee Venom Anaphylaxis    Patient Measurements: Height: 5' 10 (177.8 cm) Weight: 103.2 kg (227 lb 8.2 oz) IBW/kg (Calculated) : 73  Vital Signs:    Labs: Recent Labs    12/03/23 0502 12/04/23 0947 12/05/23 0442 12/05/23 1146  HGB 7.6* 9.2*  --  8.3*  HCT 24.1* 29.7*  --  26.7*  PLT 121* 138*  --  116*  LABPROT 18.6* 24.4* 47.4* 62.1*  INR 1.5* 2.1* 4.9* 6.9*  HEPARINUNFRC 0.41 0.29*  --   --   CREATININE  --   --  1.30*  --     Estimated Creatinine Clearance: 59.1 mL/min (A) (by C-G formula based on SCr of 1.3 mg/dL (H)).  Assessment: 50 YOM with 2 recent L MCA strokes s/p MT each time.  Patient went to Rehab and was receiving Eliquis , last dose on 8/12 at 0847.  Patient returned to the inpatient side with recurrent CVA on 11/29/23.  MRI shows petechial hemorrhage with new/increased scattered punctate infarcts and new microhemorrhages.  Pharmacy consulted to dose IV heparin  bridge to Coumadin . Stopping Eliquis .  INR up 2.1 > 4.9. Repeat INR higher at 6.9. Discussed with MD. Giving x1 dose of vitamin k. Will monitor INR Q12h until back in goal range along with monitor closely for s/sx bleeding.  Goal of Therapy:  INR 2 to 3 Monitor platelets by anticoagulation protocol: Yes  Plan:  Hold warfarin x1 dose Give vitamin k 2.5mg  x1 PO  Check INR daily while on warfarin Continue to monitor H&H and platelets   Thank you for allowing pharmacy to be a part of this patient's care.  Shelba Collier, PharmD, BCPS Clinical Pharmacist

## 2023-12-05 NOTE — Progress Notes (Signed)
 Physical Therapy Session Note  Patient Details  Name: Allen Macdonald MRN: [id number] Date of Birth: [birth year]  Today's Date: 12/05/2023 PT Individual Time: 0800-0830 PT Individual Time Calculation (min): 30 min   Short Term Goals: Week 1:  PT Short Term Goal 1 (Week 1): Pt will complete transfers with CGA consistently PT Short Term Goal 2 (Week 1): Pt will complete up/down 4 steps with CGA and BHRs PT Short Term Goal 3 (Week 1): Pt will ambulate 150' with RW CGA  Skilled Therapeutic Interventions/Progress Updates:      Pt seated in recliner upon arrival. Pt agreeable to therapy. Pt denies any pain.   Pt performed stand pivot transfer recliner to WC with RW and min A for power up, CGA for pivot, verba cues provided for UE positioning and anterior weight shift. Pt deneis any dizziness/lightheadedness upon standing this AM.   Pt transorted dependent in WC to ortho gym for time/energy conservation.   Pt performed stand pivot transfer WC to car simulator at height of sedan (as pt wife reports unsure what vehicle they will be discharging home in, Pt wife endorses inability to drive 2/2 visual deficits. Unsure who is taking them home yet -- depends on D/C date and friend/family availability. Pt required mod A for power up and controlled descent 2/2 lower level surface, verbal cues provided UE positioning, and sequencing.   Pt self propeleld WC with B UE and min A x30 feet (2/2 fatigue), pt required assist for correction of veering to R, verbal cues provided for technique and sequencing, and scanning to R of enviornment for obstacle negotiation.   Pt seated in WC at sink to brush teeth, pt required assist to put toothpaste on tooth brush. Verbal cues provided for leaning forward to reach paper towels with R UE.   Pt demos tendecy to mouth breathe when fatigued, verbal cues provided for pursed lip breathing with activity and at rest, RA SpO2 100.   Pt required seated rest breaks for  SOB/fatigue.   Pt with OT and end of session.     Therapy Documentation Precautions:  Precautions Precautions: Fall Recall of Precautions/Restrictions: Intact Precaution/Restrictions Comments: bilat nephrostomy bags, fall, aphasia, mild R hemi Restrictions Weight Bearing Restrictions Per Provider Order: No  Therapy/Group: Individual Therapy  [facility] Doreene Orris, [place], DPT  12/05/2023, 7:50 AM

## 2023-12-05 NOTE — Progress Notes (Signed)
 Inpatient Rehabilitation  Patient information reviewed and entered into eRehab system by [facility]. [name] Kays., CCC/SLP, PPS Coordinator.  Information including medical coding, functional ability and quality indicators will be reviewed and updated through discharge.

## 2023-12-05 NOTE — Progress Notes (Signed)
 Occupational Therapy Session Note  Patient Details  Name: Allen Macdonald MRN: [id number] Date of Birth: Sep 05, 1950  Today's Date: 12/05/2023 OT Individual Time: 9169-9069 OT Individual Time Calculation (min): 60 min    Short Term Goals: Week 1:  OT Short Term Goal 1 (Week 1): Pt will complete UB dressing with verbal cues only. OT Short Term Goal 2 (Week 1): Pt will complete toileting with MIN A using LRAD OT Short Term Goal 3 (Week 1): Pt will complete toilet transfers with CGA using LRAD OT Short Term Goal 4 (Week 1): Pt will complete LB dressing CGA using AE and LRAD PRN  Skilled Therapeutic Interventions/Progress Updates:    Pt received in wc requesting to shave.  Before shaving had him simulate shaving using the capped razor.  With L hand he was able to reach B sides of face easily, but with right hand could only to part of R cheek.  Had him do the actual shaving but with his L hand only.  Set up from seated position.  Supervision with sit to stands from wc with cues to push up from wc vs reaching for the sink.  Pt stood several times to doff and don brief and pants. He did need A to manage clothing over feet and may benefit from a reacher.   He had a small amount of soil in his brief and unable to reach fully with L hand so needed A to cleanse adequately.  Pt already had on a clean shirt.   Used RW to transfer to recliner.   Pt has limited shoulder AROM but able to use distal extremity fairly.  Pt worked on A/AROM of R shoulder using RW and tipping it back and forth with B hands on RW.  Also practiced with B hands clasped with reaching R arm up to 100 degrees of extension.  Pt is challenged by low endurance which he does feel is somewhat worse since these CVAs.   Pt resting in recliner with all needs met.   FAST-UL Outcome Measure  Hand-to-mouth (HtM) Movement Starting Position: Participant seated on a standard chair without armrests. Trunk leaning on back support of chair.  Both hands placed in pronated position on the ipsilateral middle thigh. Feet placed flat on the floor. If participants have any difficulty in understanding instructions (i.e. aphasia) a visual demonstration is suggested. For each of the 5 tasks of the FAST-UL, the subject at first performs the movement with the less affected UL and then with the affected one. The movement can be repeated 3 times and the best score of the three attempts is assigned.   Instructions: Each subject is asked to move the hand towards the mouth, touch it with fingertips and return to the thigh. Motor task occurs without moving the trunk off the back support and without moving the head toward the hand.   Scoring: Clinical score from 0 to 3 is provided by comparing affected side with less affected one as follows: 0 = no movement at all. 1 = The movement task is not completed (less of 50% of the contralateral HtM movement). 2 = The movement task is not completed (more of 50% of the contralateral HtM movement but the mouth is not reached) or the movement task is completed with compensations. If the mouth is touched with the wrist or the palm or the movement is performed with head or trunk compensations (flexion of the head and trunk towards the hand) the score is 2.   3 =  movement carried out at 100% of the contralateral HtM movement. HtM occurs with adequate shoulder flexion and abduction, elbow flexion, and forearm supination. The mouth is touched with fingertips.  Patient Score: 2   Reach to Target (RtT) Movement Starting Position: Same starting conditions of HtM movement. Instructions: Each subject is asked to move the hand toward a target (i.e. the hand of the examiner) located in front of the subject in the ipsilateral workspace at shoulder height, at a distance corresponding to 100% of the fully extended UL within arm's reach (less affected arm as reference). Participants have to reach, touch the target, and return.  Motor task occurs without moving the trunk off the back support. Scoring: Clinical score from 0 to 3 is provided by comparing affected side with less affected one as follows: 0 = no movement at all. 1 = The movement task is not completed (less of 50% of the contralateral RtT movement).  2 = The movement task is not completed (more of 50% of the contralateral RtT movement but the target is not reached) or the movement task is completed with compensations (i.e. the trunk loses contact with the back support of the chair with forward displacement, shoulder flexion occurs with excessive scapular elevation, or shoulder excessive abduction). If the target is reached with trunk or shoulder compensations for inadequate elbow and finger extension the score is 2.  3 = movement performed at 100% of the contralateral RtT. The target is reached with adequate shoulder flexion, elbow, wrist and finger extension.  Patient Score: 2   Prono-supination (PS) Movement Starting Position: Same starting conditions of HtM movement. Instructions: Motor task occurs without moving the trunk anteriorly or laterally, the medial side of the humerus is against the body, the forearm is fully pronated with the hand resting on the thigh. Scoring: Clinical score from 0 to 3 is provided by comparing paretic side with less affected one as follows: 0 = no movement at all. 1 = The movement task is not completed (less of 50% of the contralateral PS movement).  2 = The movement task is not completed (more of 50% of the contralateral PS movement but the forearm is not fully supinated) or the movement task is completed with compensations (i.e. excessive trunk inclination, shoulder abduction). If the movement is completed with compensations at elbow, shoulder or trunk level the score is 2. 3 = movement performed at 100% of the contralateral PS (complete supination of the forearm with the dorsal part of the hand in contact with the thigh).    Patient Score: 2   Grasp and Release (GaR) Movement Starting position: Participant seated on a standard chair. Hip and knees in 90 flexion, feet flat on the floor. Upper limb (UL) resting on a table in front of the participant with approximately 90 elbow flexion, forearm pronated and fingers in a relaxed extended and adducted position.  Instructions: The subject performs a grasping movement of a cylindrical rigid glass (at least 6 cm diameter) placed proximally to an imaginary line connecting the distal joints of thumb and index finger. The subject is asked to grasp the glass, lift it at least 2 cm (elbow remains in contact with the table), and release it. Scoring:  Clinical score from 0 to 3 is provided by comparing affected side with less affected one as follows: 0 = No movement. The grasp is not possible. 1 = The movement task is not completed (less of 50% of the task). Some prehension is possible but the  grasp is not sufficiently stable to lift the object; the grasp can be performed with the use of the less affected hand only to stabilize the glass for inadequate hand/finger opening and the release is not possible. Some hand opening is required otherwise the score is 0. 2 = The movement task is not completed (more of 50% of the task). The object is grasped and lifted but it falls or the task is completed using alternative grasping strategies (i.e. multi-pulpar, palmar, digito palmar; grasping and releasing of the object is possible with abnormal orientation of the wrist and fingers toward the object and the forearm is lifted off the table). 3 = The task is completed using the expected pattern (normal orientation of fingers or wrist toward the object, the grasp occurs with thumb and fingers in opposition, forearm supination, elbow flexion; thumb abduction and finger extension to release the object).  Patient Score: 3   Pinch and Release (PaR) Movement Starting position: Same starting  conditions of GaR movement The participant performs a PaR movement of a pen placed on a table in the midline of an imaginary line connecting the distal joints of thumb and index finger. The participants asked to pinch the pen with the tips of thumb and index finger, lift it at least 2 cm (elbow remains in contact with the table), and release it. Clinical score from 0 to 3 is provided by comparing affected side with non-affected one as follows: 0 = No movement. The pinch is not possible. 1 = The movement task is not completed (less of 50% of the task). Some prehension is possible but the pinch is not sufficiently stable to lift the object; the pinch occurs with the use of the less affected hand to stabilize the object for inadequate finger opening and the release is not possible. Some fingers movement is required otherwise the score is 0. 2 = The movement task is not completed (more of 50% of the task). The object is pinched and lifted but it falls or the task is completed using alternative pinching strategies (e.g. pinching with all the fingers, tripod pinch, pinching and releasing of the object is possible with abnormal orientation of fingers and wrist toward the object and the forearm is lifted off the table). 3 = The task is completed using the expected pattern (normal orientation of fingers or wrist toward the object, the pinch occurs with opposition of pads of index finger and thumb, and wrist extension).  Patient Score: 3  Total score: 12/15   Therapy Documentation Precautions:  Precautions Precautions: Fall Recall of Precautions/Restrictions: Intact Precaution/Restrictions Comments: bilat nephrostomy bags, fall, aphasia, mild R hemi Restrictions Weight Bearing Restrictions Per Provider Order: No   Pain: Pain Assessment Pain Score: 0-No pain    Therapy/Group: Individual Therapy  Adaly Puder 12/05/2023, 11:26 AM

## 2023-12-05 NOTE — Progress Notes (Signed)
 Patient ID: Allen Macdonald, male   DOB: November 16, 1950, 73 y.o.   MRN: [id number]  Inpatient Rehabilitation Care Coordinator Assessment and Plan Patient Details  Name: Allen Macdonald MRN: [id number] Date of Birth: 1951/03/12   Today's Date: 11/28/2023   Hospital Problems: Principal Problem:   CVA (cerebral vascular accident) [facility])   Past Medical History:      Past Medical History:  Diagnosis Date   Abnormal stress test minimal ischemia involving apex 05/01/2019   Bladder cancer (HCC)     Dilated cardiomyopathy (HCC)     Essential hypertension 12/18/2015   History of DVT (deep vein thrombosis) 04/22/2017   History of pulmonary embolism 12/18/2015   Laryngeal cancer (HCC) squamous cell 02/25/2020   PC (prostate cancer) (HCC) 08/28/2019        Past Surgical History:       Past Surgical History:  Procedure Laterality Date   CATARACT EXTRACTION Bilateral     INSERTION PROSTATE RADIATION SEED   2003   IR CT HEAD LTD   11/20/2023   IR PERCUTANEOUS ART THROMBECTOMY/INFUSION INTRACRANIAL INC DIAG ANGIO   11/20/2023   IR PERCUTANEOUS ART THROMBECTOMY/INFUSION INTRACRANIAL INC DIAG ANGIO   11/21/2023   IR US  GUIDE VASC ACCESS RIGHT   11/20/2023   IR US  GUIDE VASC ACCESS RIGHT   11/21/2023   PROSTATE SURGERY       RADIOLOGY WITH ANESTHESIA N/A 11/20/2023    Procedure: RADIOLOGY WITH ANESTHESIA;  Surgeon: Radiologist, Medication, MD;  Location: [facility] OR;  Service: Radiology;  Laterality: N/A;   RADIOLOGY WITH ANESTHESIA N/A 11/21/2023    Procedure: RADIOLOGY WITH ANESTHESIA;  Surgeon: Radiologist, Medication, MD;  Location: [facility] OR;  Service: Radiology;  Laterality: N/A;   TONSILLECTOMY       Venacava Filter       WISDOM TOOTH EXTRACTION            Social History:  reports that he has never smoked. He has never used smokeless tobacco. He reports current alcohol use of about 14.0 standard drinks of alcohol per week. He reports that he does not use drugs.   Family / Support Systems Marital Status:  Married Patient Roles: Spouse Spouse/Significant Other: Allen Macdonald [phone] Other Supports: Friends and neighbors Anticipated Caregiver: Allen Macdonald Ability/Limitations of Caregiver: Allen Macdonald has visual issues and can not drive pt was the driver Caregiver Availability: 24/7 Family Dynamics: Close with wife and both depende upon one another to meet their needs. They do have some friends who will check on and church members   Social History Preferred language: English Religion: None Cultural Background: NA Education: HS Health Literacy - How often do you need to have someone help you when you read instructions, pamphlets, or other written material from your doctor or pharmacy?: Never Writes: Yes Employment Status: Retired Marine scientist Issues: NA Guardian/Conservator: None-according to MD pt is capable of making his own decisions while here    Abuse/Neglect Abuse/Neglect Assessment Can Be Completed: Yes Physical Abuse: Denies Verbal Abuse: Denies Sexual Abuse: Denies Exploitation of patient/patient's resources: Denies Self-Neglect: Denies   Patient response to: Social Isolation - How often do you feel lonely or isolated from those around you?: Never   Emotional Status Pt's affect, behavior and adjustment status: Pt is motivated to do well and recover and regain his independence. He was dealing with his bladder cancer and now has had a CVA. He feels he is doing better and recovering from both. Recent Psychosocial Issues: other health issues Psychiatric History: No history seems to be  coping appropriately and able to verbalize his feelings and concerns Substance Abuse History: NA   Patient / Family Perceptions, Expectations & Goals Pt/Family understanding of illness & functional limitations: Pt is able to explain his health issues and is taking each day at a time. He does talk with the MD when rounding and feels understands his plan moving forward. 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   Community [employer] Agencies: None Premorbid Home Care/DME Agencies: Other (Comment) (riser, rw, rollator, wc, shower seat) Transportation available at discharge: pt wife can not drvie due to visual issues Is the patient able to respond to transportation needs?: Yes In the past 12 months, has lack of transportation kept you from medical appointments or from getting medications?: No In the past 12 months, has lack of transportation kept you from meetings, work, or from getting things needed for daily living?: No   Discharge Planning Living Arrangements: Spouse/significant other Support Systems: Spouse/significant other, Friends/neighbors, Psychologist, clinical community Type of Residence: Private residence Insurance Resources: Media planner (specify) Radiographer, therapeutic) Financial Resources: Social Security, Family Support Financial Screen Referred: No Living Expenses: Own Money Management: Patient, Spouse Does the patient have any problems obtaining your medications?: No Home Management: both Patient/Family Preliminary Plans: Return home with wife who is able to assist but just doesn't drive. She has to get rides to visit here. Discussed team conference goals and ELOS. WIll update after team conference on Wednesday. Care Coordinator Barriers to Discharge: Decreased caregiver support, Lack of/limited family support, Insurance for SNF coverage Care Coordinator Anticipated Follow Up Needs: HH/OP   Clinical Impression Pleasant gentleman who is motivated to do well and recover from his stroke and issues with his bladder cancer. His wife is supportive and will do what she can to assist him, she does have visual issues. Will work on discharge needs.    Allen Macdonald 11/28/2023, 10:53 AM   Addendum: Will continue to follow pt and work on discharge needs. Team Conference on Wednesday.

## 2023-12-05 NOTE — Progress Notes (Addendum)
 PROGRESS NOTE   Subjective/Complaints:  Pt up at sink cleaning up with OT. No new issues this morning.   ROS: Patient denies fever, rash, sore throat, blurred vision, dizziness, nausea, vomiting, diarrhea, cough, shortness of breath or chest pain, joint or back/neck pain, headache, or mood change.   Objective:   No results found. Recent Labs    12/03/23 0502 12/04/23 0947  WBC 5.7 9.1  HGB 7.6* 9.2*  HCT 24.1* 29.7*  PLT 121* 138*   Recent Labs    12/05/23 0442  NA 140  K 3.8  CL 107  CO2 22  GLUCOSE 96  BUN 23  CREATININE 1.30*  CALCIUM  8.5*        Intake/Output Summary (Last 24 hours) at 12/05/2023 0938 Last data filed at 12/05/2023 0600 Gross per 24 hour  Intake --  Output 625 ml  Net -625 ml        Physical Exam: Vital Signs Blood pressure (!) 142/78, pulse 97, temperature 98.1 F (36.7 C), temperature source Axillary, resp. rate 18, height 5' 10 (1.778 m), weight 103.2 kg, SpO2 94%.  General: No acute distress HEENT: NCAT, EOMI, oral membranes moist Cards: reg rate  Chest: normal effort Abdomen: Soft, NT, ND Extremities:tr edema. Nephrostomy tubes with clear yellow urine Psych: pleasant and appropriate   Skin: intact over exposed surfaces, R hand IV remains c/d/I. Scattered bruises on forearms Neuro: Alert and oriented x2 ( 2022), right sided strength is 4/5, otherwise strength is 5/5. Sensory exam normal for light touch and pain in all 4 limbs. No limb ataxia or cerebellar signs. No abnormal tone appreciated.    Assessment/Plan: 1. Functional deficits which require 3+ hours per day of interdisciplinary therapy in a comprehensive inpatient rehab setting. Physiatrist is providing close team supervision and 24 hour management of active medical problems listed below. Physiatrist and rehab team continue to assess barriers to discharge/monitor patient progress toward functional and medical  goals  Care Tool:  Bathing              Bathing assist Assist Level: Moderate Assistance - Patient 50 - 74%     Upper Body Dressing/Undressing Upper body dressing   What is the patient wearing?: Pull over shirt    Upper body assist Assist Level: Minimal Assistance - Patient > 75%    Lower Body Dressing/Undressing Lower body dressing            Lower body assist Assist for lower body dressing: Minimal Assistance - Patient > 75% (simulated)     Toileting Toileting    Toileting assist Assist for toileting: Moderate Assistance - Patient 50 - 74%     Transfers Chair/bed transfer  Transfers assist     Chair/bed transfer assist level: Moderate Assistance - Patient 50 - 74%     Locomotion Ambulation   Ambulation assist      Assist level: Minimal Assistance - Patient > 75% Assistive device: Walker-rolling Max distance: 80   Walk 10 feet activity   Assist     Assist level: Minimal Assistance - Patient > 75% Assistive device: Walker-rolling   Walk 50 feet activity   Assist    Assist level: Minimal  Assistance - Patient > 75% Assistive device: Walker-rolling    Walk 150 feet activity   Assist Walk 150 feet activity did not occur: Safety/medical concerns (fatigue)         Walk 10 feet on uneven surface  activity   Assist Walk 10 feet on uneven surfaces activity did not occur: Safety/medical concerns (fatigue)         Wheelchair     Assist Is the patient using a wheelchair?: Yes Type of Wheelchair: Manual    Wheelchair assist level: Dependent - Patient 0%      Wheelchair 50 feet with 2 turns activity    Assist        Assist Level: Dependent - Patient 0%   Wheelchair 150 feet activity     Assist          Blood pressure (!) 142/78, pulse 97, temperature 98.1 F (36.7 C), temperature source Axillary, resp. rate 18, height 5' 10 (1.778 m), weight 103.2 kg, SpO2 94%.  Medical Problem List and Plan: 1.  Functional deficits secondary to left MCA scattered infarcts with left ICA and left MCA occlusion s/p IR with TICI2c followed by L ICA and basilar tip emboli s/p mechanical thrombectomy TIC3, likely due to hypercoagulable state in setting of cancer             -patient may shower, cover incision/nephrostomy tubes             -ELOS/Goals: 12-16 days, PT/OT sup to min A, SLP supervision            -Continue CIR therapies including PT, OT, and SLP  2.  Antithrombotics: -DVT: hx of multiple BL DVT and PE had IVC filter placed 2014  -US  LE positive for age indeterminate DVTs: RT - PopV and PTV LT - CFV, PFV, FV, PopV, Gastrocs  -Probable hypercoagulable state r/t carcinoma  -Pharmaceutical: previously Eliquis  5mg  BID,  heparin  drip transitioned to   coumadin    -12/04/23 Hgb up to 9.2, so improved greatly; FOBT neg  8/18 INR 4.9 today,  hold per pharm protocol    -cbc pending             -antiplatelet therapy: N/A 3. Pain Management: Tylenol  prn  4. Mood/Behavior/Sleep: LCSW to follow for evaluation and support when available.              -antipsychotic agents: N/A  -melatonin PRN 5. Neuropsych/cognition: This patient may be capable of making decisions on his own behalf. 6. Skin/Wound Care: routine pressure relief measures    7. Fluids/Electrolytes/Nutrition: Monitor I/O  - Per speech past swallow study continue regular diet w/ Ensure and multivitamin supplements-- reordered from prior admission orders   8/18 I personally reviewed the patient's labs today.   8.  Bladder Cancer s/p nephrostomy tubes: stage IV, metastatic to the bone by MRI of the spine on 11/08/2023.  -Hx of prostate CA, on androgen therapy- leupron q6mos-- [facility]On[facility] -prior UTI: Ceftriaxone  1g daily ended 8/10   9. AKI: Monitor BMP w/GFR  Cr 1.22--1.42--1.52>> 1.16 on 8/13.   - 8/18 Cr 1.3 today, ?baseline renal insufficiency?  -encourage fluids 10.  Anemia: Hemoglobin stable post PRBC 2 units Hgb 9.9--  downtrended during readmission, 9.0--8.5--8.5--7.7--7.6 but up to 9.2 on 8/17.  -12/05/23 Hgb 9.2, FOBT neg. Monitor twice weekly.    11. HTN now with hypotension: hx of orthostasis which resolved after IV fluids--now holding home amlodipine  5 mg daily and Carvdilol 12.5 mg - resume as  needed. Avoid hypotension             -12/05/23 blood pressures reasonable while still holding meds, monitor   Vitals:   12/03/23 1300 12/03/23 1929 12/04/23 0616 12/04/23 1200  BP: 129/73 131/65 132/64 122/73   12/04/23 2100  BP: (!) 142/78      13. HLD: LDL 112, goal < 70, atorvastatin  80mg  daily   14. Obesity: Educate on diet and weight loss to promote overall health and mobility.  Body mass index is 33.06 kg/m.   15. Hx of laryngeal cancer s/p radiation therapy, voice sounds mildly hoarse  16. GERD: reordered pepcid  20mg  daily PRN since it appears that's how he was using it; might also warrant Protonix ? Monitor.   17. Elevated LFTs: AST 67/ALT 68, alkphos/bili WNL on 8/9. Related to Ca?  -repeat LFTs today perhaps sl improved. Follow up again next week.    -consider changing statin   18. Thrombocytopenia: plt 138 on admission labs 8/17, had been 110s-140s, monitor twice weekly  LOS: 2 days A FACE TO FACE EVALUATION WAS PERFORMED  Arthea ONEIDA Gunther 12/05/2023, 9:38 AM

## 2023-12-05 NOTE — Plan of Care (Signed)
 SABRA

## 2023-12-05 NOTE — Progress Notes (Signed)
 Critical lab: INR 4.9. MD on called notified.

## 2023-12-05 NOTE — Significant Event (Addendum)
 Critical Lab Value INR 6.9 MD notified, directed RN to pharmacist, RN spoke with Yancy @[phone] . Pharmacist with contact MD with new order

## 2023-12-05 NOTE — Progress Notes (Signed)
 Inpatient Rehabilitation Center Individual Statement of Services  Patient Name:  Allen Macdonald  Date:  12/05/2023  Welcome to the Inpatient Rehabilitation Center.  Our goal is to provide you with an individualized program based on your diagnosis and situation, designed to meet your specific needs.  With this comprehensive rehabilitation program, you will be expected to participate in at least 3 hours of rehabilitation therapies Monday-Friday, with modified therapy programming on the weekends.  Your rehabilitation program will include the following services:  Physical Therapy (PT), Occupational Therapy (OT), Speech Therapy (ST), 24 hour per day rehabilitation nursing, Neuropsychology, Care Coordinator, Rehabilitation Medicine, Nutrition Services, and Pharmacy Services  Weekly team conferences will be held on Wednesday to discuss your progress.  Your Inpatient Rehabilitation Care Coordinator will talk with you frequently to get your input and to update you on team discussions.  Team conferences with you and your family in attendance may also be held.  Expected length of stay: 12-14 days  Overall anticipated outcome: supervision with cues  Depending on your progress and recovery, your program may change. Your Inpatient Rehabilitation Care Coordinator will coordinate services and will keep you informed of any changes. Your Inpatient Rehabilitation Care Coordinator's name and contact numbers are listed  below.  The following services may also be recommended but are not provided by the Inpatient Rehabilitation Center:  Driving Evaluations Home Health Rehabiltiation Services Outpatient Rehabilitation Services    Arrangements will be made to provide these services after discharge if needed.  Arrangements include referral to agencies that provide these services.  Your insurance has been verified to be:  [employer] Your primary doctor is:  Emergency planning/management officer  Pertinent information will be shared with  your doctor and your insurance company.  Inpatient Rehabilitation Care Coordinator:  Rhoda Clement, KEN [phone] or [name]  Information discussed with and copy given to patient by: Clement [name], 12/05/2023, 8:29 AM

## 2023-12-06 LAB — PROTIME-INR
INR: 3.8 — ABNORMAL HIGH (ref 0.8–1.2)
Prothrombin Time: 39.3 s — ABNORMAL HIGH (ref 11.4–15.2)

## 2023-12-06 NOTE — Plan of Care (Signed)
  Problem: Consults Goal: RH STROKE PATIENT EDUCATION Description: See Patient Education module for education specifics  Outcome: Progressing   Problem: RH BOWEL ELIMINATION Goal: RH STG MANAGE BOWEL WITH ASSISTANCE Description: STG Manage Bowel with mod I Assistance. Outcome: Progressing Goal: RH STG MANAGE BOWEL W/MEDICATION W/ASSISTANCE Description: STG Manage Bowel with Medication with mod I Assistance. Outcome: Progressing   Problem: RH BLADDER ELIMINATION Goal: RH STG MANAGE BLADDER WITH ASSISTANCE Description: STG Manage Bladder With min Assistance Outcome: Progressing Goal: RH STG MANAGE BLADDER WITH EQUIPMENT WITH ASSISTANCE Description: STG Manage Bladder With Equipment With min Assistance Outcome: Progressing   Problem: RH PAIN MANAGEMENT Goal: RH STG PAIN MANAGED AT OR BELOW PT'S PAIN GOAL Description: < 4 with prns Outcome: Progressing   Problem: RH KNOWLEDGE DEFICIT Goal: RH STG INCREASE KNOWLEDGE OF HYPERTENSION Description: Patient and wife will be able to manage HTN using educational resources for medications and dietary modification independently Outcome: Progressing Goal: RH STG INCREASE KNOWLEGDE OF HYPERLIPIDEMIA Description: Patient and wife will be able to manage HLD using educational resources for medications and dietary modification independently Outcome: Progressing Goal: RH STG INCREASE KNOWLEDGE OF STROKE PROPHYLAXIS Description: Patient and wife will be able to manage secondary risks using educational resources for medications and dietary modification independently Outcome: Progressing

## 2023-12-06 NOTE — IPOC Note (Signed)
 Overall Plan of Care [facility]) Patient Details Name: Allen Macdonald MRN: [id number] DOB: 09-Jun-1950  Admitting Diagnosis: CVA (cerebral vascular accident) [facility])  Hospital Problems: Principal Problem:   CVA (cerebral vascular accident) [facility])     Functional Problem List: Nursing Bladder, Safety, Bowel, Endurance, Medication Management, Pain  PT Balance, Behavior, Edema, Endurance, Motor, Pain, Perception, Safety  OT Balance, Cognition, Endurance, Motor, Perception, Safety  SLP Cognition, Linguistic  TR         Basic ADL's: OT Grooming, Bathing, Dressing, Toileting     Advanced  ADL's: OT None     Transfers: PT Bed Mobility, Bed to Chair, Car, Occupational psychologist, Research scientist (life sciences): PT Ambulation, Psychologist, prison and probation services, Stairs     Additional Impairments: OT    SLP Communication expression, comprehension    TR      Anticipated Outcomes Item Anticipated Outcome  Self Feeding mod I  Swallowing      Basic self-care  SBA  Toileting  SBA   Bathroom Transfers SBA  Bowel/Bladder  manage bowel w mod I assist and bladder/nephrostomy tubes with min assist  Transfers  supervision  Locomotion  supervision  Communication  Supervision  Cognition  Mod I  Pain  Pain < 4 with prns  Safety/Judgment  manage safety w cues   Therapy Plan: PT Intensity: Minimum of 1-2 x/day ,45 to 90 minutes PT Frequency: 5 out of 7 days PT Duration Estimated Length of Stay: 12-14 days OT Intensity: Minimum of 1-2 x/day, 45 to 90 minutes OT Frequency: 5 out of 7 days OT Duration/Estimated Length of Stay: 12-14 days SLP Intensity: Minumum of 1-2 x/day, 30 to 90 minutes SLP Frequency: 3 to 5 out of 7 days SLP Duration/Estimated Length of Stay: 12-14 days   Team Interventions: Nursing Interventions Patient/Family Education, Medication Management, Bladder Management, Bowel Management, Disease Management/Prevention, Pain Management, Discharge Planning  PT interventions  Ambulation/gait training, Discharge planning, Functional mobility training, Psychosocial support, Therapeutic Activities, Visual/perceptual remediation/compensation, Balance/vestibular training, Neuromuscular re-education, Therapeutic Exercise, Disease management/prevention, Cognitive remediation/compensation, DME/adaptive equipment instruction, UE/LE Strength taining/ROM, Splinting/orthotics, Community reintegration, Equities trader education, Museum/gallery curator, UE/LE Coordination activities, Functional electrical stimulation, Pain management, Skin care/wound management  OT Interventions Disease mangement/prevention, Warden/ranger, Cognitive remediation/compensation, Firefighter, Discharge planning, DME/adaptive equipment instruction, Functional mobility training, Neuromuscular re-education, Functional electrical stimulation, Pain management, Patient/family education, Psychosocial support, Self Care/advanced ADL retraining, Skin care/wound managment, Splinting/orthotics, Therapeutic Activities, Therapeutic Exercise, UE/LE Strength taining/ROM, UE/LE Coordination activities, Visual/perceptual remediation/compensation  SLP Interventions Multimodal communication approach, Speech/Language facilitation, Cueing hierarchy, Functional tasks, Therapeutic Activities, Patient/family education, Internal/external aids  TR Interventions    SW/CM Interventions Discharge Planning, Psychosocial Support, Patient/Family Education   Barriers to Discharge MD  Medical stability  Nursing Decreased caregiver support, Home environment access/layout 1 level 3 ste bil rail w spouse who has low vision/does not drive and able to provide supervision assist  PT Inaccessible home environment, Decreased caregiver support, Home environment access/layout, Pending chemo/radiation, Lack of/limited family support 3 steps unilateral handrail, limited caregiver support (wife is visually impaired), orthostatic hypotension   OT None    SLP      SW       Team Discharge Planning: Destination: PT-Home ,OT- Home , SLP-Home Projected Follow-up: PT-Home health PT, OT-  Home health OT, SLP-Outpatient SLP, Home Health SLP Projected Equipment Needs: PT-To be determined, OT- To be determined, SLP-None recommended by SLP Equipment Details: PT- , OT-owns W/C, RW, BSC Patient/family involved in discharge planning: PT- Patient,  OT-Patient, SLP-Patient, Family  member/caregiver  MD ELOS: 12-14 days Medical Rehab Prognosis:  Excellent Assessment: The patient has been admitted for CIR therapies with the diagnosis of left MCA infarct complicated by diffuse DVT, supratherapeutic INR upon readmission to rehab. The team will be addressing functional mobility, strength, stamina, balance, safety, adaptive techniques and equipment, self-care, bowel and bladder mgt, patient and caregiver education, NMR, speech, communication, community reentry. Goals have been set at stand by assist with OT, supervision with mobility, supervision with communication and mod I with cognition. Anticipated discharge destination is home.        See Team Conference Notes for weekly updates to the plan of care

## 2023-12-06 NOTE — Progress Notes (Signed)
 Physical Therapy Session Note  Patient Details  Name: Allen Macdonald MRN: [id number] Date of Birth: June 10, 1950  Today's Date: 12/06/2023 PT Individual Time: 8646-8496 PT Individual Time Calculation (min): 70 min   Short Term Goals: Week 1:  PT Short Term Goal 1 (Week 1): Pt will complete transfers with CGA consistently PT Short Term Goal 2 (Week 1): Pt will complete up/down 4 steps with CGA and BHRs PT Short Term Goal 3 (Week 1): Pt will ambulate 150' with RW CGA  Skilled Therapeutic Interventions/Progress Updates: Patient sitting in recliner on entrance to room. Patient alert and agreeable to PT session.   Patient reported no pain during session. Pt provided with education throughout on hand placement during sit<>stands and breathing through B nostrils with cues for pt to reiterate education. Pt aphasia prevented pt from finding correct wording, but pt able to demonstrate what he meant by using B hands or breathing technique of inhaling through nose and exhaling through mouth (after requiring modA to recall sequence).   Therapeutic Activity: Transfers: Pt performed sit<>stand transfers throughout session with RW and minA to stand and CGA to transfer for safety with VC to reach back for controlled descent. Pt transported from room<>main gym in [facility] for energy conservation.  TUG (avg = 34.45s) Pt provided with education following on hand placement when standing/sitting, and to ensure set up of anterior scoot and B LE's under knees with anterior lean in order to improve LOA with transfers. Pt in RW with light minA to stand and CGA throughout for safety.   - 37.24s  - 31.61s  - 34.50s  5xSTS (avg = 39.56s) to RW from [facility] with pt desating on 2nd round to 85% (noted in vitals). Pt with good hand placement when standing, but required education following to reach back to control descent. On 3rd round, pt educated prior to performing to inhale through B nostrils prior to each stand to increase SpO2  with notable difference in pt's recovery time (SpO2 showed 100% with HR at highest of 103 bpm with drop below 100 bpm after short rest break and breathing).   - 35.99s  - 38.38s  - 44.31s  - Pt ambulated in RW with CGA and WC follow for safety. Pt cued to recall breathing in through nostrils throughout and was able to ambulate 153' with reports of SOB (unable to obtain SpO2) but with decreased time required to rebound to pt self report of decreased SOB.  Patient sitting in recliner at end of session with brakes locked, chair alarm set, and all needs within reach.      Therapy Documentation Precautions:  Precautions Precautions: Fall Recall of Precautions/Restrictions: Intact Precaution/Restrictions Comments: bilat nephrostomy bags, fall, aphasia, mild R hemi Restrictions Weight Bearing Restrictions Per Provider Order: No  Vitals SpO2 - lowest at 85% after 2nd 5xSTS trial that required 1-1.5 min to rebound above 90%; HR highest at 107 bpm that dropped below 100 bpm after SpO2 increased to 95%   Therapy/Group: Individual Therapy  Maridee Slape PTA 12/06/2023, 3:07 PM

## 2023-12-06 NOTE — Progress Notes (Signed)
 PROGRESS NOTE   Subjective/Complaints:  Pt in recliner. No complaints this morning. Anxious to get moving again with therapy today.  ROS: Patient denies fever, rash, sore throat, blurred vision, dizziness, nausea, vomiting, diarrhea, cough, shortness of breath or chest pain, joint or back/neck pain, headache, or mood change.   Objective:   No results found. Recent Labs    12/04/23 0947 12/05/23 1146  WBC 9.1 7.6  HGB 9.2* 8.3*  HCT 29.7* 26.7*  PLT 138* 116*   Recent Labs    12/05/23 0442  NA 140  K 3.8  CL 107  CO2 22  GLUCOSE 96  BUN 23  CREATININE 1.30*  CALCIUM  8.5*        Intake/Output Summary (Last 24 hours) at 12/06/2023 0958 Last data filed at 12/06/2023 0400 Gross per 24 hour  Intake 358 ml  Output 1225 ml  Net -867 ml        Physical Exam: Vital Signs Blood pressure 118/75, pulse 84, temperature 97.8 F (36.6 C), temperature source Oral, resp. rate 18, height 5' 10 (1.778 m), weight 103.2 kg, SpO2 100%.  Constitutional: No distress . Vital signs reviewed. HEENT: NCAT, EOMI, oral membranes moist Neck: supple Cardiovascular: RRR without murmur. No JVD    Respiratory/Chest: CTA Bilaterally without wheezes or rales. Normal effort    GI/Abdomen: BS +, non-tender, non-distended Ext: no clubbing, cyanosis, or edema Psych: pleasant and cooperative  Uro: nephrostomy tubes in place with urine Skin: intact over exposed surfaces, R hand IV remains c/d/I. Scattered bruises on forearms present, NSL right arm Neuro: Alert and oriented x2 ( 2022), right sided strength is 4/5, otherwise strength is 5/5. Sensory exam normal for light touch and pain in all 4 limbs. No limb ataxia or cerebellar signs. No abnormal tone appreciated.  Halting speech, non-fluent  Assessment/Plan: 1. Functional deficits which require 3+ hours per day of interdisciplinary therapy in a comprehensive inpatient rehab  setting. Physiatrist is providing close team supervision and 24 hour management of active medical problems listed below. Physiatrist and rehab team continue to assess barriers to discharge/monitor patient progress toward functional and medical goals  Care Tool:  Bathing              Bathing assist Assist Level: Moderate Assistance - Patient 50 - 74%     Upper Body Dressing/Undressing Upper body dressing   What is the patient wearing?: Pull over shirt    Upper body assist Assist Level: Minimal Assistance - Patient > 75%    Lower Body Dressing/Undressing Lower body dressing      What is the patient wearing?: Pants, Incontinence brief     Lower body assist Assist for lower body dressing: Moderate Assistance - Patient 50 - 74%     Toileting Toileting    Toileting assist Assist for toileting: Moderate Assistance - Patient 50 - 74%     Transfers Chair/bed transfer  Transfers assist  Chair/bed transfer activity did not occur: Safety/medical concerns  Chair/bed transfer assist level: Moderate Assistance - Patient 50 - 74%     Locomotion Ambulation   Ambulation assist      Assist level: Minimal Assistance - Patient > 75% Assistive device:  Walker-rolling Max distance: 80   Walk 10 feet activity   Assist     Assist level: Minimal Assistance - Patient > 75% Assistive device: Walker-rolling   Walk 50 feet activity   Assist    Assist level: Minimal Assistance - Patient > 75% Assistive device: Walker-rolling    Walk 150 feet activity   Assist Walk 150 feet activity did not occur: Safety/medical concerns (fatigue)         Walk 10 feet on uneven surface  activity   Assist Walk 10 feet on uneven surfaces activity did not occur: Safety/medical concerns (fatigue)         Wheelchair     Assist Is the patient using a wheelchair?: Yes Type of Wheelchair: Manual    Wheelchair assist level: Dependent - Patient 0%      Wheelchair 50  feet with 2 turns activity    Assist        Assist Level: Dependent - Patient 0%   Wheelchair 150 feet activity     Assist      Assist Level: Dependent - Patient 0%   Blood pressure 118/75, pulse 84, temperature 97.8 F (36.6 C), temperature source Oral, resp. rate 18, height 5' 10 (1.778 m), weight 103.2 kg, SpO2 100%.  Medical Problem List and Plan: 1. Functional deficits secondary to left MCA scattered infarcts with left ICA and left MCA occlusion s/p IR with TICI2c followed by L ICA and basilar tip emboli s/p mechanical thrombectomy TIC3, likely due to hypercoagulable state in setting of cancer             -patient may shower, cover incision/nephrostomy tubes             -ELOS/Goals: 12-16 days, PT/OT sup to min A, SLP supervision            -Continue CIR therapies including PT, OT, and SLP   2.  Antithrombotics: -DVT: hx of multiple BL DVT and PE had IVC filter placed 2014  -US  LE positive for age indeterminate DVTs: RT - PopV and PTV LT - CFV, PFV, FV, PopV, Gastrocs  -Probable hypercoagulable state r/t carcinoma  -Pharmaceutical: previously Eliquis  5mg  BID,  heparin  drip transitioned to   coumadin    -12/04/23 Hgb up to 9.2, so improved greatly; FOBT neg  8/18 INR 4.9 today,  hold per pharm protocol    -cbc pending  8/19 -pt received vit K yesterday. INR back to 3.8 today   -resume fulll activity   -pharmacy to dose warfarin today. Appreciate pharmacy assistance             -antiplatelet therapy: N/A 3. Pain Management: Tylenol  prn  4. Mood/Behavior/Sleep: LCSW to follow for evaluation and support when available.              -antipsychotic agents: N/A  -melatonin PRN 5. Neuropsych/cognition: This patient may be capable of making decisions on his own behalf. 6. Skin/Wound Care: routine pressure relief measures    7. Fluids/Electrolytes/Nutrition: Monitor I/O  - Per speech past swallow study continue regular diet w/ Ensure and multivitamin supplements--  reordered from prior admission orders   8/18 I personally reviewed the patient's labs today.   8.  Bladder Cancer s/p nephrostomy tubes: stage IV, metastatic to the bone by MRI of the spine on 11/08/2023.  -Hx of prostate CA, on androgen therapy- leupron q6mos-- follows with [facility] -prior UTI: Ceftriaxone  1g daily completed 8/10   9. AKI: Monitor BMP w/GFR  Cr 1.22--1.42--1.52>> 1.16  on 8/13.   - 8/18 Cr 1.3 today, ?baseline renal insufficiency?  -encourage fluids 10.  Anemia: Hemoglobin stable post PRBC 2 units Hgb 9.9-- downtrended during readmission, 9.0--8.5--8.5--7.7--7.6 but up to 9.2 on 8/17.  -12/05/23 Hgb 8.3, FOBT neg. 8.19 recheck hgb tomorrow     11. HTN now with hypotension: hx of orthostasis which resolved after IV fluids--now holding home amlodipine  5 mg daily and Carvdilol 12.5 mg - resume as needed. Avoid hypotension             -8/19  blood pressures reasonable while still holding meds, no chnges today   Vitals:   12/03/23 1300 12/03/23 1929 12/04/23 0616 12/04/23 1200  BP: 129/73 131/65 132/64 122/73   12/04/23 2100 12/05/23 1312 12/05/23 2024 12/06/23 0419  BP: (!) 142/78 111/64 (!) 145/82 118/75      13. HLD: LDL 112, goal < 70, atorvastatin  80mg  daily   14. Obesity: Educate on diet and weight loss to promote overall health and mobility.  Body mass index is 33.06 kg/m.   15. Hx of laryngeal cancer s/p radiation therapy, voice sounds mildly hoarse  16. GERD: reordered pepcid  20mg  daily PRN since it appears that's how he was using it; might also warrant Protonix ? Monitor.   17. Elevated LFTs: AST 67/ALT 68, alkphos/bili WNL on 8/9. Related to Ca?  -repeat LFTs today perhaps sl improved. Follow up again next week.    -consider changing statin   18. Thrombocytopenia: plts in the 110's  LOS: 3 days A FACE TO FACE EVALUATION WAS PERFORMED  Allen Macdonald 12/06/2023, 9:58 AM

## 2023-12-06 NOTE — Progress Notes (Signed)
 Occupational Therapy Session Note  Patient Details  Name: Allen Macdonald MRN: [id number] Date of Birth: 11-07-1950  Today's Date: 12/06/2023 OT Individual Time: 1000-1100 OT Individual Time Calculation (min): 60 min    Short Term Goals: Week 1:  OT Short Term Goal 1 (Week 1): Pt will complete UB dressing with verbal cues only. OT Short Term Goal 2 (Week 1): Pt will complete toileting with MIN A using LRAD OT Short Term Goal 3 (Week 1): Pt will complete toilet transfers with CGA using LRAD OT Short Term Goal 4 (Week 1): Pt will complete LB dressing CGA using AE and LRAD PRN  Skilled Therapeutic Interventions/Progress Updates:    Pt received in recliner and agreeable to bathing and dressing.  Pt participated extremely well and is actively using his R hand in all tasks. He is now able to lift his arm to 110 degrees of sh flexion. His main obstacle is low endurance.  He has labored breathing with exertion but O2 sats drop only to 91-92% and resume with rest.   After completing self care with use of reacher to don shorts (Assisted pt with compression socks and shoes. ) , pt taken to gym via w/c to work on arm bike for 5 min at level 1.  This was challenging for pt but he wanted to accomplish the 5 min goal.  Hand off to speech therapy.   Therapy Documentation Precautions:  Precautions Precautions: Fall Recall of Precautions/Restrictions: Intact Precaution/Restrictions Comments: bilat nephrostomy bags, fall, aphasia, mild R hemi Restrictions Weight Bearing Restrictions Per Provider Order: No   Pain: no c/o pain    ADL: ADL Grooming: Contact guard (in standing at sink, able to manipulate containers and slightly squeeze toothpast onto toothbrush with Rt hand) Where Assessed-Grooming: Standing at sink Upper Body Bathing: Supervision/safety Where Assessed-Upper Body Bathing: Wheelchair Where Assessed-Lower Body Bathing: Wheelchair Upper Body Dressing: Supervision/safety Lower Body  Dressing: Moderate assistance Where Assessed-Lower Body Dressing: Chair Toileting: Moderate assistance Toilet Transfer Method: Ambulating Tub/Shower Transfer: Not assessed Film/video editor: Not assessed ADL Comments: pt sponge bathes at baseline due to ports and drains     Therapy/Group: Individual Therapy  Jacub Waiters 12/06/2023, 12:58 PM

## 2023-12-06 NOTE — Progress Notes (Signed)
 PHARMACY - ANTICOAGULATION CONSULT NOTE  Pharmacy Consult:  Heparin  to Warfarin Indication: History of VTE with recurrent CVAs  Allergies  Allergen Reactions   Bee Venom Anaphylaxis    Patient Measurements: Height: 5' 10 (177.8 cm) Weight: 103.2 kg (227 lb 8.2 oz) IBW/kg (Calculated) : 73  Vital Signs: Temp: 97.8 F (36.6 C) (08/19 0419) Temp Source: Oral (08/19 0419) BP: 118/75 (08/19 0419) Pulse Rate: 84 (08/19 0419)  Labs: Recent Labs    12/04/23 0947 12/05/23 0442 12/05/23 1146 12/06/23 0419  HGB 9.2*  --  8.3*  --   HCT 29.7*  --  26.7*  --   PLT 138*  --  116*  --   LABPROT 24.4* 47.4* 62.1* 39.3*  INR 2.1* 4.9* 6.9* 3.8*  HEPARINUNFRC 0.29*  --   --   --   CREATININE  --  1.30*  --   --     Estimated Creatinine Clearance: 59.1 mL/min (A) (by C-G formula based on SCr of 1.3 mg/dL (H)).  Assessment: 72 YOM with 2 recent L MCA strokes s/p MT each time.  Patient went to Rehab and was receiving Eliquis , last dose on 8/12 at 0847.  Patient returned to the inpatient side with recurrent CVA on 11/29/23.  MRI shows petechial hemorrhage with new/increased scattered punctate infarcts and new microhemorrhages.  Pharmacy consulted to dose IV heparin  bridge to Coumadin . Stopping Eliquis .  INR still elevated  Goal of Therapy:  INR 2 to 3 Monitor platelets by anticoagulation protocol: Yes  Plan:  Hold warfarin x1 dose  Check INR daily while on warfarin Continue to monitor H&H and platelets   Thank you for allowing pharmacy to be a part of this patient's care. Olam Monte, PharmD

## 2023-12-06 NOTE — Progress Notes (Signed)
 Speech Language Pathology Daily Session Note  Patient Details  Name: Allen Macdonald MRN: [id number] Date of Birth: [birth year]  Today's Date: 12/06/2023 SLP Individual Time: 1100-1157 SLP Individual Time Calculation (min): 57 min  Short Term Goals: Week 1: SLP Short Term Goal 1 (Week 1): Pt will complete mildly complex naming tasks w/ minA SLP Short Term Goal 2 (Week 1): Pt will follow multi step commands with min A verbal and visual cues. SLP Short Term Goal 3 (Week 1): Pt will complete functional wirtten expression tasks with min A. SLP Short Term Goal 4 (Week 1): Pt will formulate sentences using word finding strategies to communticate his wants and needs with min a. SLP Short Term Goal 5 (Week 1): Pt will read and comprehend functional written information with min A.  Skilled Therapeutic Interventions: Skilled therapy session focused on communication goals. SLP facilitated session by targeting phrase completion task. Given three answers, patient completed phrases with 95% accuracy and minA. SLP then prompted completion of responsive naming task. Patient named items given their purpose with 84% accuracy and minA. Patient required occasional breaks during activities to breath. Patient continues to demonstrate apraxia + aphasia with phonemic paraphasias, neologisms and perseverations. At the end of the session, SLP targeted written communication. Patient independently wrote name, however required model to write items such as current location and bathroom, pain, food, drink. Patient left in chair with alarm set and call bell in reach. Continue POC   Pain None reported to SLP  Therapy/Group: Individual Therapy  Kalin Amrhein M.A., CCC-SLP 12/06/2023, 7:38 AM

## 2023-12-07 LAB — CBC
HCT: 22.3 % — ABNORMAL LOW (ref 39.0–52.0)
Hemoglobin: 7.1 g/dL — ABNORMAL LOW (ref 13.0–17.0)
MCH: 30.5 pg (ref 26.0–34.0)
MCHC: 31.8 g/dL (ref 30.0–36.0)
MCV: 95.7 fL (ref 80.0–100.0)
Platelets: 89 K/uL — ABNORMAL LOW (ref 150–400)
RBC: 2.33 MIL/uL — ABNORMAL LOW (ref 4.22–5.81)
RDW: 19.7 % — ABNORMAL HIGH (ref 11.5–15.5)
WBC: 6.3 K/uL (ref 4.0–10.5)
nRBC: 0 % (ref 0.0–0.2)

## 2023-12-07 LAB — PROTIME-INR
INR: 2.5 — ABNORMAL HIGH (ref 0.8–1.2)
Prothrombin Time: 28.3 s — ABNORMAL HIGH (ref 11.4–15.2)

## 2023-12-07 MED ORDER — WARFARIN SODIUM 5 MG PO TABS
5.0000 mg | ORAL_TABLET | Freq: Once | ORAL | Status: AC
  Administered 2023-12-07: 5 mg via ORAL
  Filled 2023-12-07: qty 1

## 2023-12-07 MED ORDER — FERROUS SULFATE 325 (65 FE) MG PO TABS
325.0000 mg | ORAL_TABLET | Freq: Every day | ORAL | Status: DC
  Administered 2023-12-07 – 2023-12-19 (×12): 325 mg via ORAL
  Filled 2023-12-07 (×13): qty 1

## 2023-12-07 MED ORDER — B COMPLEX-C PO TABS
1.0000 | ORAL_TABLET | Freq: Every day | ORAL | Status: DC
  Administered 2023-12-07 – 2023-12-19 (×12): 1 via ORAL
  Filled 2023-12-07 (×13): qty 1

## 2023-12-07 NOTE — Patient Care Conference (Signed)
 Inpatient RehabilitationTeam Conference and Plan of Care Update Date: 12/07/2023   Time: 10:19 AM    Patient Name: Allen Macdonald      Medical Record Number: [id number]  Date of Birth: August 02, 1950 Sex: Male         Room/Bed: 4M01C/4M01C-01 Payor Info: Payor: [employer] MEDICARE / Plan: [employer] MEDICARE HMO/PPO / Product Type: *No Product type* /    Admit Date/Time:  12/03/2023  2:45 PM  Primary Diagnosis:  CVA (cerebral vascular accident) [facility])  Hospital Problems: Principal Problem:   CVA (cerebral vascular accident) [facility])    Expected Discharge Date: Expected Discharge Date: 12/17/23  Team Members Present: Physician leading conference: Dr. Murray Collier Social Worker Present: Rhoda Clement, LCSW Nurse Present: Barnie Ronde, RN PT Present: Sherlean Perks, PT;Dominic Sandoval, PTA OT Present: Recardo Maxwell, OT SLP Present: Blaise Alderman, SLP     Current Status/Progress Goal Weekly Team Focus  Bowel/Bladder      Nephrostomy tubes    Wife manages nephrostomy tubes    Manage nephrostomy tubes/skin care while hospitalized  Swallow/Nutrition/ Hydration               ADL's   min A standing balance and transfers,  min -mod A LB dressing, min LB bathing and toileting   supervison goals   RUE NMR, balance, ADL training, pt/fam education    Mobility   Bed mobility = minA; Transfers = minA sit<>stand and CGA to transfer in RW; Gait = CGA 153'; Stairs = not performed as of yet   supervision  Focus = endurance, dynamic standing balance, breathing through B nostrils to increase/maintain adequate SpO2, stairs, ambulation, dual tasks    Communication   modA   supervisionA   mildly complex yes/no questions, word finding, verbal expression of wants/needs    Safety/Cognition/ Behavioral Observations               Pain      N/A           Skin      Nephrostomy tubes, Groin puncture site   Groin site healing    Monitor skin q shift    Discharge Planning:  Home  with wife who has visual deficits but can assist him. Pt is doing better after coming back to CIR. Neuro-psych to see while here   Team Discussion: Patient admitted post basal ganglia CVA with thrombectomy.  INR therapeutic per MD however anema persists.  Rechecking labs and guiac stools (-); may need a transfusion.  Also note labored breathing at times and oxygen desaturation with activity.  Patient on target to meet rehab goals: yes, currently needs supervision for sit - stand transfers and ambulation. Needs CGA - min assist for ADLs and mod assist for complex expressive language. Goals for discharge set for supervision overall.   *See Care Plan and progress notes for long and short-term goals.   Revisions to Treatment Plan:  N/a   Teaching Needs: Safety, medications, dietary modification, transfers, toileting, etc.   Current Barriers to Discharge: Decreased caregiver support and Home enviroment access/layout  Possible Resolutions to Barriers: Family education     Medical Summary Current Status: Pt admitted with left MCA infarct. hx of DVT, new DVT's on doppler--on warfarin with supratherapeutic level yesterday, hx bladder cancer with nephrostomy tubes, htn  Barriers to Discharge: Medical stability   Possible Resolutions to [employer] Focus: pharmacy working with medical team to dose warfarin, brief bedrest until level dropped this morning. continue mgt of bp, monitoring of blood counts,  etc   Continued Need for Acute Rehabilitation Level of Care: The patient requires daily medical management by a physician with specialized training in physical medicine and rehabilitation for the following reasons: Direction of a multidisciplinary physical rehabilitation program to maximize functional independence : Yes Medical management of patient stability for increased activity during participation in an intensive rehabilitation regime.: Yes Analysis of laboratory values and/or radiology  reports with any subsequent need for medication adjustment and/or medical intervention. : Yes   I attest that I was present, lead the team conference, and concur with the assessment and plan of the team.   Fredericka Sober B 12/07/2023, 2:04 PM

## 2023-12-07 NOTE — Progress Notes (Signed)
 Speech Language Pathology Daily Session Note  Patient Details  Name: Allen Macdonald MRN: [id number] Date of Birth: 1950/07/18  Today's Date: 12/07/2023 SLP Individual Time: 0830-0915 SLP Individual Time Calculation (min): 45 min  Short Term Goals: Week 1: SLP Short Term Goal 1 (Week 1): Pt will complete mildly complex naming tasks w/ minA SLP Short Term Goal 2 (Week 1): Pt will follow multi step commands with min A verbal and visual cues. SLP Short Term Goal 3 (Week 1): Pt will complete functional wirtten expression tasks with min A. SLP Short Term Goal 4 (Week 1): Pt will formulate sentences using word finding strategies to communticate his wants and needs with min a. SLP Short Term Goal 5 (Week 1): Pt will read and comprehend functional written information with min A.  Skilled Therapeutic Interventions:   Pt greeted at bedside. He was awake in his recliner upon SLP arrival and very agreeable to tx tasks targeting communication goals. During initial conversation, he required only minA and additional time for sentence formulation to express mildly complex thoughts. However, modA required for sentence formulation during more structured expression tasks. He completed a mildly specific responsive naming task (animals) w/ minA cues for word finding. During a concrete generative naming task (3-5 items), he required only minA. Mod-maxA required for generative naming task w/ abstract items. He presented w/ semantic paraphasias and neologisms throughout structured naming tasks. He remains aware of errors, but benefits from modA cues to correct errors given apraxia. At the end of tx tasks, he was left in his recliner w/ the alarm set and call light within reach. Recommend cont ST per POC.     Pain Pain Assessment Pain Scale: 0-10 Pain Score: 0-No pain  Therapy/Group: Individual Therapy  [name] 12/07/2023, 8:59 AM

## 2023-12-07 NOTE — Progress Notes (Signed)
 PHARMACY - ANTICOAGULATION CONSULT NOTE  Pharmacy Consult:  Heparin  to Warfarin Indication: History of VTE with recurrent CVAs  Allergies  Allergen Reactions   Bee Venom Anaphylaxis    Patient Measurements: Height: 5' 10 (177.8 cm) Weight: 103.2 kg (227 lb 8.2 oz) IBW/kg (Calculated) : 73  Vital Signs: Temp: 97.8 F (36.6 C) (08/20 0401) Temp Source: Oral (08/19 7941) BP: 126/64 (08/20 0401) Pulse Rate: 88 (08/20 0401)  Labs: Recent Labs    12/04/23 0947 12/05/23 0442 12/05/23 1146 12/06/23 0419 12/07/23 0354  HGB 9.2*  --  8.3*  --  7.1*  HCT 29.7*  --  26.7*  --  22.3*  PLT 138*  --  116*  --  89*  LABPROT 24.4* 47.4* 62.1* 39.3* 28.3*  INR 2.1* 4.9* 6.9* 3.8* 2.5*  HEPARINUNFRC 0.29*  --   --   --   --   CREATININE  --  1.30*  --   --   --     Estimated Creatinine Clearance: 59.1 mL/min (A) (by C-G formula based on SCr of 1.3 mg/dL (H)).  Assessment: 68 YOM with 2 recent L MCA strokes s/p MT each time.  Patient went to Rehab and was receiving Eliquis , last dose on 8/12 at 0847.  Patient returned to the inpatient side with recurrent CVA on 11/29/23.  MRI shows petechial hemorrhage with new/increased scattered punctate infarcts and new microhemorrhages.  Pharmacy consulted to dose IV heparin  bridge to Coumadin . Stopping Eliquis .  INR down to 2.5 today. Anticipating that it will continues to trend down due to vit k.   Goal of Therapy:  INR 2 to 3 Monitor platelets by anticoagulation protocol: Yes  Plan:  Coumadin  5mg  PO x1 Check INR daily while on warfarin Continue to monitor H&H and platelets   Sergio Batch, PharmD, BCIDP, AAHIVP, CPP Infectious Disease Pharmacist

## 2023-12-07 NOTE — Progress Notes (Addendum)
 PROGRESS NOTE   Subjective/Complaints:  Pt reports occasional productive cough. Denies sob. Had a good day yesterday  ROS: Patient denies fever, rash, sore throat, blurred vision, dizziness, nausea, vomiting, diarrhea, shortness of breath or chest pain, joint or back/neck pain, headache, or mood change. .   Objective:   No results found. Recent Labs    12/05/23 1146 12/07/23 0354  WBC 7.6 6.3  HGB 8.3* 7.1*  HCT 26.7* 22.3*  PLT 116* 89*   Recent Labs    12/05/23 0442  NA 140  K 3.8  CL 107  CO2 22  GLUCOSE 96  BUN 23  CREATININE 1.30*  CALCIUM  8.5*        Intake/Output Summary (Last 24 hours) at 12/07/2023 0848 Last data filed at 12/07/2023 0747 Gross per 24 hour  Intake 712 ml  Output 1780 ml  Net -1068 ml        Physical Exam: Vital Signs Blood pressure 126/64, pulse 88, temperature 97.8 F (36.6 C), resp. rate 18, height 5' 10 (1.778 m), weight 103.2 kg, SpO2 99%.  Constitutional: No distress . Vital signs reviewed. HEENT: NCAT, EOMI, oral membranes moist Neck: supple Cardiovascular: RRR without murmur. No JVD    Respiratory/Chest: CTA Bilaterally without wheezes or rales. Normal effort    GI/Abdomen: BS +, non-tender, non-distended Ext: no clubbing, cyanosis, or edema Psych: pleasant and cooperative   Uro: nephrostomy tubes in place with urine Skin: intact over exposed surfaces, R hand IV remains c/d/I. Scattered bruises on forearms present, NSL right arm Neuro: Alert and oriented x2 ( 2022), right sided strength is 4/5, otherwise strength is 5/5. Sensory exam normal for light touch and pain in all 4 limbs. No limb ataxia or cerebellar signs. No abnormal tone appreciated.  Halting speech, non-fluent  Assessment/Plan: 1. Functional deficits which require 3+ hours per day of interdisciplinary therapy in a comprehensive inpatient rehab setting. Physiatrist is providing close team supervision  and 24 hour management of active medical problems listed below. Physiatrist and rehab team continue to assess barriers to discharge/monitor patient progress toward functional and medical goals  Care Tool:  Bathing              Bathing assist Assist Level: Moderate Assistance - Patient 50 - 74%     Upper Body Dressing/Undressing Upper body dressing   What is the patient wearing?: Pull over shirt    Upper body assist Assist Level: Minimal Assistance - Patient > 75%    Lower Body Dressing/Undressing Lower body dressing      What is the patient wearing?: Pants, Incontinence brief     Lower body assist Assist for lower body dressing: Moderate Assistance - Patient 50 - 74%     Toileting Toileting    Toileting assist Assist for toileting: Moderate Assistance - Patient 50 - 74%     Transfers Chair/bed transfer  Transfers assist  Chair/bed transfer activity did not occur: Safety/medical concerns  Chair/bed transfer assist level: Contact Guard/Touching assist     Locomotion Ambulation   Ambulation assist      Assist level: Contact Guard/Touching assist Assistive device: Walker-rolling Max distance: 153   Walk 10 feet activity  Assist     Assist level: Contact Guard/Touching assist Assistive device: Walker-rolling   Walk 50 feet activity   Assist    Assist level: Contact Guard/Touching assist Assistive device: Walker-rolling    Walk 150 feet activity   Assist Walk 150 feet activity did not occur: Safety/medical concerns (fatigue)  Assist level: Contact Guard/Touching assist Assistive device: Walker-rolling    Walk 10 feet on uneven surface  activity   Assist Walk 10 feet on uneven surfaces activity did not occur: Safety/medical concerns (fatigue)         Wheelchair     Assist Is the patient using a wheelchair?: Yes Type of Wheelchair: Manual    Wheelchair assist level: Dependent - Patient 0%      Wheelchair 50 feet with  2 turns activity    Assist        Assist Level: Dependent - Patient 0%   Wheelchair 150 feet activity     Assist      Assist Level: Dependent - Patient 0%   Blood pressure 126/64, pulse 88, temperature 97.8 F (36.6 C), resp. rate 18, height 5' 10 (1.778 m), weight 103.2 kg, SpO2 99%.  Medical Problem List and Plan: 1. Functional deficits secondary to left MCA scattered infarcts with left ICA and left MCA occlusion s/p IR with TICI2c followed by L ICA and basilar tip emboli s/p mechanical thrombectomy TIC3, likely due to hypercoagulable state in setting of cancer             -patient may shower, cover incision/nephrostomy tubes             -ELOS/Goals: 12-16 days, PT/OT sup to min A, SLP supervision             -Continue CIR therapies including PT, OT, and SLP. Interdisciplinary team conference today to discuss goals, barriers to discharge, and dc planning.     2.  Antithrombotics: -DVT: hx of multiple BL DVT and PE had IVC filter placed 2014  -US  LE positive for age indeterminate DVTs: RT - PopV and PTV LT - CFV, PFV, FV, PopV, Gastrocs  -Probable hypercoagulable state r/t carcinoma  -Pharmaceutical: previously Eliquis  5mg  BID,  heparin  drip transitioned to   coumadin    -12/04/23 Hgb up to 9.2, so improved greatly; FOBT neg  8/20 INR is now therapeutic 2.5, appreciate pharmacy assistance   -Hgb has dropped, see below             -antiplatelet therapy: N/A 3. Pain Management: Tylenol  prn  4. Mood/Behavior/Sleep: LCSW to follow for evaluation and support when available.              -antipsychotic agents: N/A  -melatonin PRN 5. Neuropsych/cognition: This patient may be capable of making decisions on his own behalf. 6. Skin/Wound Care: routine pressure relief measures    7. Fluids/Electrolytes/Nutrition: Monitor I/O  - Per speech past swallow study continue regular diet w/ Ensure and multivitamin supplements-- reordered from prior admission orders   8/18 I personally  reviewed the patient's labs today.   8.  Bladder Cancer s/p nephrostomy tubes: stage IV, metastatic to the bone by MRI of the spine on 11/08/2023.  -Hx of prostate CA, on androgen therapy- leupron q6mos-- [facility] -prior UTI: Ceftriaxone  1g daily completed 8/10   9. AKI: Monitor BMP w/GFR  Cr 1.22--1.42--1.52>> 1.16 on 8/13.   - 8/18 Cr 1.3 today, ?baseline renal insufficiency?  -encourage fluids 10.  Anemia: Hemoglobin stable post PRBC 2 units Hgb 9.9--  downtrended during readmission, 9.0--8.5--8.5--7.7--7.6 but up to 9.2 on 8/17.  -12/05/23 Hgb 8.3, FOBT neg. 8.19 recheck hgb tomorrow   8/20 hgb down to 7.1, no gross blood, stool was ob negative, normocytic   -INR therapeutic   -recheck stool x 1   -recheck hgb in AM    -on thiamin, change to b complex and add ferrrous sulfate 11. HTN now with hypotension: hx of orthostasis which resolved after IV fluids--now holding home amlodipine  5 mg daily and Carvdilol 12.5 mg - resume as needed. Avoid hypotension             -8/20  blood pressures reasonable while still holding meds, no chnges today   Vitals:   12/03/23 1300 12/03/23 1929 12/04/23 0616 12/04/23 1200  BP: 129/73 131/65 132/64 122/73   12/04/23 2100 12/05/23 1312 12/05/23 2024 12/06/23 0419  BP: (!) 142/78 111/64 (!) 145/82 118/75   12/06/23 1327 12/06/23 2058 12/07/23 0401  BP: 126/83 (!) 144/71 126/64      13. HLD: LDL 112, goal < 70, atorvastatin  80mg  daily   14. Obesity: Educate on diet and weight loss to promote overall health and mobility.  Body mass index is 33.06 kg/m.   15. Hx of laryngeal cancer s/p radiation therapy, voice sounds mildly hoarse  16. GERD: reordered pepcid  20mg  daily PRN since it appears that's how he was using it; might also warrant Protonix ? Monitor.   17. Elevated LFTs: AST 67/ALT 68, alkphos/bili WNL on 8/9. Related to Ca?  -repeat LFTs today perhaps sl improved. Follow up again next week.    -consider changing statin   18.  Thrombocytopenia: platelets down to 89k   -no clear source, discuss with pharmacy -recheck in AM -may be Ca related and more of a pancytopenia issue LOS: 4 days A FACE TO FACE EVALUATION WAS PERFORMED  Allen Macdonald 12/07/2023, 8:48 AM

## 2023-12-07 NOTE — Plan of Care (Signed)
  Problem: Consults Goal: RH STROKE PATIENT EDUCATION Description: See Patient Education module for education specifics  Outcome: Progressing   Problem: RH PAIN MANAGEMENT Goal: RH STG PAIN MANAGED AT OR BELOW PT'S PAIN GOAL Description: < 4 with prns Outcome: Progressing   Problem: RH KNOWLEDGE DEFICIT Goal: RH STG INCREASE KNOWLEGDE OF HYPERLIPIDEMIA Description: Patient and wife will be able to manage HLD using educational resources for medications and dietary modification independently Outcome: Progressing

## 2023-12-07 NOTE — Progress Notes (Signed)
 Occupational Therapy Session Note  Patient Details  Name: Allen Macdonald MRN: [id number] Date of Birth: [birth year]  Today's Date: 12/07/2023 OT Individual Time: 0920-1002 OT Individual Time Calculation (min): 42 min    Short Term Goals: Week 1:  OT Short Term Goal 1 (Week 1): Pt will complete UB dressing with verbal cues only. OT Short Term Goal 2 (Week 1): Pt will complete toileting with MIN A using LRAD OT Short Term Goal 3 (Week 1): Pt will complete toilet transfers with CGA using LRAD OT Short Term Goal 4 (Week 1): Pt will complete LB dressing CGA using AE and LRAD PRN  Skilled Therapeutic Interventions/Progress Updates:    Pt received in recliner and requested to bathe and change clothing.  He did a fantastic job today of recalling techniques to enable him to complete smooth sit to stands and transfers with supervision.  Pt sat in arm chair at sink and practiced scooting the chair forward and back ward. He completed his ADLS (see documentation below).  He stated he used to cross his legs in figure 4 when getting his pants and socks and shoes on.  Pt able to cross R foot onto L knee with assist and then he was able to get R leg into shorts.  L leg not able to fully cross.  He can continue to work on that flexibility.   Total A today with compression socks and shoes.  Pt returned to recliner and had pt practice 3 exercises with yellow exercise band for R arm. Pt is developing distal strength well but is not able to lift arm over 100 degrees.   Pt resting in recliner with all needs met.   Therapy Documentation Precautions:  Precautions Precautions: Fall Recall of Precautions/Restrictions: Intact Precaution/Restrictions Comments: bilat nephrostomy bags, fall, aphasia, mild R hemi Restrictions Weight Bearing Restrictions Per Provider Order: No   Pain: Pain Assessment Pain Scale: 0-10 Pain Score: 0-No pain ADL: ADL Grooming: Contact guard (in standing at sink, able to manipulate  containers and slightly squeeze toothpast onto toothbrush with Rt hand) Where Assessed-Grooming: Standing at sink Upper Body Bathing: Supervision/safety Where Assessed-Upper Body Bathing: Wheelchair Where Assessed-Lower Body Bathing: Wheelchair Upper Body Dressing: Supervision/safety Lower Body Dressing: Moderate assistance Where Assessed-Lower Body Dressing: Chair Toileting: Moderate assistance Toilet Transfer Method: Ambulating Tub/Shower Transfer: Not assessed Film/video editor: Not assessed ADL Comments: pt sponge bathes at baseline due to ports and drains   Therapy/Group: Individual Therapy  Allen Macdonald 12/07/2023, 10:26 AM

## 2023-12-07 NOTE — Progress Notes (Signed)
 Physical Therapy Session Note  Patient Details  Name: Allen Macdonald MRN: [id number] Date of Birth: 1950/12/08  Today's Date: 12/07/2023 PT Individual Time: 8963-8868; 1306 - 1402 PT Individual Time Calculation (min): 55 min; 56 min   Short Term Goals: Week 1:  PT Short Term Goal 1 (Week 1): Pt will complete transfers with CGA consistently PT Short Term Goal 2 (Week 1): Pt will complete up/down 4 steps with CGA and BHRs PT Short Term Goal 3 (Week 1): Pt will ambulate 150' with RW CGA  SESSION 1 Skilled Therapeutic Interventions/Progress Updates: Patient sitting in recliner on entrance to room. Patient alert and agreeable to PT session.   Patient reported no pain during session. Pt cued to recall education provided during previous day session with this PTA with pt able to recall inhaling through nose to increase SpO2 and hand placement for sit<stand. Pt presented with aphasia when cued to recall but would be able to physically demonstrate what pt meant.  Therapeutic Activity: Transfers: Pt performed sit<>stand transfers throughout session with RW and with supervision. Provided VC for use of B UE to push off with noted improvement in requiring less assistance to stand. Pt also cued to reach back to control descent.  - Pt ambulated from room<>main gym in RW to improve tolerance for household navigation and breathing. Pt cued throughout to improve upright posture, maintaining safe proximity to RW, and to adhere to breathing sequence. Pt reported decrease in SOB following first trial at beginning of session from room to main gym (137').   Therapeutic Exercise: Pt performed the following exercises with therapist providing the described cuing and facilitation for improvement. Pt performed below in order to improve ROM/strength on B LE's to achieve carryover of donning personal pants.  - B LE adductor stretch with PTA assisting manually to pt's tolerance. 3 x 15 second holds with rest break  required and VC to improve breathing sequence. Pt able to place LE onto contralateral LE with help of UE's and minA to position adequately - B piriformis stretch sitting edge of mat with PTA assisting manually to tolerance. 3 x 15 second holds with rest break. Pt cued to lean anteriorly to R/L depending on contralateral LE stretching.  - B hip external rotation + hip flexion with 5lb ankle weight donned and cues to bring knee up to meet PTA hand, and foot to touch contralateral lower leg on medial aspect. Pt required minA to achieve cueing on R LE vs L LE. 2 sets of close to failure, then 3rd set without ankle weights donned (decreased ROM on R LE vs L, but noted improvement following interventions). Pt also cued to return LE back to neutral positioning on floor vs closer to midline.   Pt presented with R sided decreased awareness during interventions above by demonstrating increased cuing/tactile cuing to place R UE on knee vs L UE  - Sit<>stand with B UE's cued to push off mat, and to maintain sitting closer to edge to avoid using back of knees to assist. Pt performed with supervision and cues to control descent. Pt performed 2 rounds to fatigue and required rest break and continued cuing for breathing sequence.   Patient sitting in recliner at end of session with brakes locked, chair alarm set, and all needs within reach.  SESSION 2 Skilled Therapeutic Interventions/Progress Updates: Patient sitting in recliner with family present on entrance to room. Patient alert and agreeable to PT session.   Patient reported no pain during session and recalled  breathing sequence adequately (inhale through nose, and exhale through mouth). Pt family updated on pt's CLOF and progress and current deficits of functional mobility.  Therapeutic Activity: Transfers: Pt performed sit<>stand transfers throughout session with supervision/CGA. Provided VC for use of B UE's to push off of surfaces.  - Pt ambulated 303' x  1, and 190' x 1 with seated rest break required (standing rest break required x 2 on first gait trial of 303'). Pt performed in order to improve ambulatory tolerance to longer community distances. Pt cued to maintain safe proximity to RW, increase upright posture, decrease WB reliance on RW with B UE's, and to maintain breathing sequence. WC follow provided for safety.   Neuromuscular Re-ed: NMR facilitated during session with focus on dynamic standing balance, coordination, R sided awareness, neuromuscular connection to R hemi-body. - Using R UE to bring yellow 1lb ball from table on L side to elevated basketball net on R. Ball placed on up. Pt coordinated movement to grab ball across midline and place into netting (PTA grabbing ball out of net to give back to pt). Pt then cued to place ball ontop of cup. Pt performed with CGA and no LOB and demonstrated ability to flex/abduct R UE accordingly, as well as pronating/supinating R forearm. - Pt balancing cones stacked wide base to wide base with disc between from one corner of table to the other. Pt performed with CGA for safety. Pt did not drop cones. Pt progressed to saying ABC's in sequential order which increased cognitive load, and only had one instance of decreased R sided awareness by knocking over cone with R forearm while placing on table. - Pt ambulated short distance to room (roughly 10') with no AD and with overall CGA and no LOB. L HHA provided.   NMR performed for improvements in motor control and coordination, balance, sequencing, judgement, and self confidence/ efficacy in performing all aspects of mobility at highest level of independence.   Patient sitting in recliner at end of session with brakes locked, wife present, chair alarm set, and all needs within reach.       Therapy Documentation Precautions:  Precautions Precautions: Fall Recall of Precautions/Restrictions: Intact Precaution/Restrictions Comments: bilat nephrostomy  bags, fall, aphasia, mild R hemi Restrictions Weight Bearing Restrictions Per Provider Order: No  Therapy/Group: Individual Therapy  Melenie Minniear PTA 12/07/2023, 12:21 PM

## 2023-12-07 NOTE — Progress Notes (Signed)
 Patient ID: Allen Macdonald, male   DOB: 10-Oct-1950, 73 y.o.   MRN: [id number] Met with pt, wife and two friends pt gave worker permission to give team conference update. Goals of supervision level and target discharge date of 8/30. Wife has pictures of the steps for PT. Discussed coming in for education prior to discharge. She is willing to but needs to schedule in advance. She will check walker at home due to it was her's and will see if is tal enough for pt. Discussed home health and will work on discharge needs. Had questions regarding compression hose and pt's medications and got beside RN to answer theses questions for them. Continue to work on discharge needs.

## 2023-12-08 DIAGNOSIS — E876 Hypokalemia: Secondary | ICD-10-CM

## 2023-12-08 LAB — CBC
HCT: 21.4 % — ABNORMAL LOW (ref 39.0–52.0)
Hemoglobin: 6.7 g/dL — CL (ref 13.0–17.0)
MCH: 30.3 pg (ref 26.0–34.0)
MCHC: 31.3 g/dL (ref 30.0–36.0)
MCV: 96.8 fL (ref 80.0–100.0)
Platelets: 72 K/uL — ABNORMAL LOW (ref 150–400)
RBC: 2.21 MIL/uL — ABNORMAL LOW (ref 4.22–5.81)
RDW: 19.9 % — ABNORMAL HIGH (ref 11.5–15.5)
WBC: 5.9 K/uL (ref 4.0–10.5)
nRBC: 0 % (ref 0.0–0.2)

## 2023-12-08 LAB — PREPARE RBC (CROSSMATCH)

## 2023-12-08 LAB — BASIC METABOLIC PANEL WITH GFR
Anion gap: 12 (ref 5–15)
BUN: 21 mg/dL (ref 8–23)
CO2: 23 mmol/L (ref 22–32)
Calcium: 8.3 mg/dL — ABNORMAL LOW (ref 8.9–10.3)
Chloride: 108 mmol/L (ref 98–111)
Creatinine, Ser: 1.19 mg/dL (ref 0.61–1.24)
GFR, Estimated: >60 mL/min (ref >60)
Glucose, Bld: 97 mg/dL (ref 70–99)
Potassium: 3.3 mmol/L — ABNORMAL LOW (ref 3.5–5.1)
Sodium: 143 mmol/L (ref 135–145)

## 2023-12-08 LAB — PROTIME-INR
INR: 2.5 — ABNORMAL HIGH (ref 0.8–1.2)
Prothrombin Time: 27.8 s — ABNORMAL HIGH (ref 11.4–15.2)

## 2023-12-08 MED ORDER — SODIUM CHLORIDE 0.9% IV SOLUTION: Freq: Once | INTRAVENOUS | Status: DC

## 2023-12-08 MED ORDER — WARFARIN SODIUM 3 MG PO TABS
3.0000 mg | ORAL_TABLET | Freq: Once | ORAL | Status: AC
  Administered 2023-12-08: 3 mg via ORAL
  Filled 2023-12-08: qty 1

## 2023-12-08 MED ORDER — POTASSIUM CHLORIDE CRYS ER 20 MEQ PO TBCR
40.0000 meq | EXTENDED_RELEASE_TABLET | Freq: Once | ORAL | Status: AC
  Administered 2023-12-08: 40 meq via ORAL
  Filled 2023-12-08: qty 2

## 2023-12-08 NOTE — Plan of Care (Signed)
  Problem: Consults Goal: RH STROKE PATIENT EDUCATION Description: See Patient Education module for education specifics  Outcome: Progressing   Problem: RH BOWEL ELIMINATION Goal: RH STG MANAGE BOWEL WITH ASSISTANCE Description: STG Manage Bowel with mod I Assistance. Outcome: Progressing Goal: RH STG MANAGE BOWEL W/MEDICATION W/ASSISTANCE Description: STG Manage Bowel with Medication with mod I Assistance. Outcome: Progressing   Problem: RH BLADDER ELIMINATION Goal: RH STG MANAGE BLADDER WITH ASSISTANCE Description: STG Manage Bladder With min Assistance Outcome: Progressing Goal: RH STG MANAGE BLADDER WITH EQUIPMENT WITH ASSISTANCE Description: STG Manage Bladder With Equipment With min Assistance Outcome: Progressing   Problem: RH PAIN MANAGEMENT Goal: RH STG PAIN MANAGED AT OR BELOW PT'S PAIN GOAL Description: < 4 with prns Outcome: Progressing   Problem: RH KNOWLEDGE DEFICIT Goal: RH STG INCREASE KNOWLEDGE OF HYPERTENSION Description: Patient and wife will be able to manage HTN using educational resources for medications and dietary modification independently Outcome: Progressing Goal: RH STG INCREASE KNOWLEGDE OF HYPERLIPIDEMIA Description: Patient and wife will be able to manage HLD using educational resources for medications and dietary modification independently Outcome: Progressing Goal: RH STG INCREASE KNOWLEDGE OF STROKE PROPHYLAXIS Description: Patient and wife will be able to manage secondary risks using educational resources for medications and dietary modification independently Outcome: Progressing

## 2023-12-08 NOTE — Progress Notes (Signed)
 Speech Language Pathology Daily Session Note  Patient Details  Name: Allen Macdonald MRN: [id number] Date of Birth: Jul 28, 1950  Today's Date: 12/08/2023 SLP Individual Time: 0950-1050 SLP Individual Time Calculation (min): 60 min  Short Term Goals: Week 1: SLP Short Term Goal 1 (Week 1): Pt will complete mildly complex naming tasks w/ minA SLP Short Term Goal 2 (Week 1): Pt will follow multi step commands with min A verbal and visual cues. SLP Short Term Goal 3 (Week 1): Pt will complete functional wirtten expression tasks with min A. SLP Short Term Goal 4 (Week 1): Pt will formulate sentences using word finding strategies to communticate his wants and needs with min a. SLP Short Term Goal 5 (Week 1): Pt will read and comprehend functional written information with min A.  Skilled Therapeutic Interventions: Skilled therapy session focused on communication goals. SLP facilitated session by prompting completion of script training exercises. SLP provided handout with various functional phrases for patient to read - these included patients age, address, and simple wants/needs. Patient read functional phrases with supervision A for clarity. SLP continued to target communication through word description task. Patient required modA to describe functional vocabulary words and minA to complete responsive naming tasks. At the end of the session, patient independently ordered lunch and dinner with occasional instances of filler words. Patient left in chair with alarm set and call bell in reach. Continue POC  Pain None reported   Therapy/Group: Individual Therapy  Jaycee Pelzer M.A., CCC-SLP 12/08/2023, 7:40 AM

## 2023-12-08 NOTE — Progress Notes (Signed)
 PROGRESS NOTE   Subjective/Complaints:  Pt out in hall with PT. Feels a little tired and winded in general. No frank SOB except when he's really exerting.   ROS: Patient denies fever, rash, sore throat, blurred vision, dizziness, nausea, vomiting, diarrhea, cough,  chest pain, joint or back/neck pain, headache, or mood change.    Objective:   No results found. Recent Labs    12/07/23 0354 12/08/23 0351  WBC 6.3 5.9  HGB 7.1* 6.7*  HCT 22.3* 21.4*  PLT 89* 72*   Recent Labs    12/08/23 0351  NA 143  K 3.3*  CL 108  CO2 23  GLUCOSE 97  BUN 21  CREATININE 1.19  CALCIUM  8.3*        Intake/Output Summary (Last 24 hours) at 12/08/2023 1031 Last data filed at 12/08/2023 0907 Gross per 24 hour  Intake 1267 ml  Output 950 ml  Net 317 ml        Physical Exam: Vital Signs Blood pressure (!) 126/52, pulse 83, temperature (!) 97.5 F (36.4 C), temperature source Axillary, resp. rate 18, height 5' 10 (1.778 m), weight 103.2 kg, SpO2 100%.  Constitutional: No distress . Vital signs reviewed. HEENT: NCAT, EOMI, oral membranes moist Neck: supple Cardiovascular: RRR without murmur. No JVD    Respiratory/Chest: CTA Bilaterally without wheezes or rales. Normal effort    GI/Abdomen: BS +, non-tender, non-distended Ext: no clubbing, cyanosis, or edema Psych: pleasant and cooperative   Uro: nephrostomy tubes in place with urine Skin: intact over exposed surfaces, R hand IV remains c/d/I. Scattered bruises on forearms present, NSL right arm. Appears a bit paler  Neuro: Alert and oriented x2, right sided strength is 4/5, otherwise strength is 5/5. Sensory exam normal for light touch and pain in all 4 limbs. No limb ataxia or cerebellar signs. No abnormal tone appreciated.  Halting speech, non-fluent, perhaps with some improvement  Assessment/Plan: 1. Functional deficits which require 3+ hours per day of interdisciplinary  therapy in a comprehensive inpatient rehab setting. Physiatrist is providing close team supervision and 24 hour management of active medical problems listed below. Physiatrist and rehab team continue to assess barriers to discharge/monitor patient progress toward functional and medical goals  Care Tool:  Bathing    Body parts bathed by patient: Right arm, Left arm, Chest, Abdomen, Right upper leg, Left upper leg, Front perineal area, Face   Body parts bathed by helper: Buttocks, Right lower leg, Left lower leg     Bathing assist Assist Level: Minimal Assistance - Patient > 75%     Upper Body Dressing/Undressing Upper body dressing   What is the patient wearing?: Pull over shirt    Upper body assist Assist Level: Supervision/Verbal cueing    Lower Body Dressing/Undressing Lower body dressing      What is the patient wearing?: Pants, Incontinence brief     Lower body assist Assist for lower body dressing: Minimal Assistance - Patient > 75%     Toileting Toileting    Toileting assist Assist for toileting: Moderate Assistance - Patient 50 - 74%     Transfers Chair/bed transfer  Transfers assist  Chair/bed transfer activity did not occur:  Safety/medical concerns  Chair/bed transfer assist level: Supervision/Verbal cueing     Locomotion Ambulation   Ambulation assist      Assist level: Contact Guard/Touching assist Assistive device: Walker-rolling Max distance: 153   Walk 10 feet activity   Assist     Assist level: Contact Guard/Touching assist Assistive device: Walker-rolling   Walk 50 feet activity   Assist    Assist level: Contact Guard/Touching assist Assistive device: Walker-rolling    Walk 150 feet activity   Assist Walk 150 feet activity did not occur: Safety/medical concerns (fatigue)  Assist level: Contact Guard/Touching assist Assistive device: Walker-rolling    Walk 10 feet on uneven surface  activity   Assist Walk 10 feet  on uneven surfaces activity did not occur: Safety/medical concerns (fatigue)         Wheelchair     Assist Is the patient using a wheelchair?: Yes Type of Wheelchair: Manual    Wheelchair assist level: Dependent - Patient 0%      Wheelchair 50 feet with 2 turns activity    Assist        Assist Level: Dependent - Patient 0%   Wheelchair 150 feet activity     Assist      Assist Level: Dependent - Patient 0%   Blood pressure (!) 126/52, pulse 83, temperature (!) 97.5 F (36.4 C), temperature source Axillary, resp. rate 18, height 5' 10 (1.778 m), weight 103.2 kg, SpO2 100%.  Medical Problem List and Plan: 1. Functional deficits secondary to left MCA scattered infarcts with left ICA and left MCA occlusion s/p IR with TICI2c followed by L ICA and basilar tip emboli s/p mechanical thrombectomy TIC3, likely due to hypercoagulable state in setting of cancer             -patient may shower, cover incision/nephrostomy tubes             -ELOS/Goals: 12/17/23.  PT/OT sup to min A, SLP supervision             -Continue CIR therapies including PT, OT, and SLP  2.  Antithrombotics: -DVT: hx of multiple BL DVT and PE had IVC filter placed 2014  -US  LE positive for age indeterminate DVTs: RT - PopV and PTV LT - CFV, PFV, FV, PopV, Gastrocs  -Probable hypercoagulable state r/t carcinoma  -Pharmaceutical: previously Eliquis  5mg  BID,  heparin  drip transitioned to   coumadin    -12/04/23 Hgb up to 9.2, so improved greatly; FOBT neg  8/20-21 INR is now therapeutic 2.5, appreciate pharmacy assistance   -Hgb has dropped, see below             -antiplatelet therapy: N/A 3. Pain Management: Tylenol  prn  4. Mood/Behavior/Sleep: LCSW to follow for evaluation and support when available.              -antipsychotic agents: N/A  -melatonin PRN 5. Neuropsych/cognition: This patient may be capable of making decisions on his own behalf. 6. Skin/Wound Care: routine pressure relief measures     7. Fluids/Electrolytes/Nutrition: Monitor I/O  - Per speech past swallow study continue regular diet w/ Ensure and multivitamin supplements-- reordered from prior admission orders   -encouraging po 8/21- potassium 3.3---supplement.   8.  Bladder Cancer s/p nephrostomy tubes: stage IV, metastatic to the bone by MRI of the spine on 11/08/2023.  -Hx of prostate CA, on androgen therapy- leupron q6mos-- [facility] with [facility] -prior UTI: Ceftriaxone  1g daily completed 8/10   9. AKI: Monitor BMP w/GFR  Cr 1.22--1.42--1.52>> 1.16 on 8/13.   - 8/18 Cr 1.3 today, ?baseline renal insufficiency?  -encourage fluids 10.  Anemia: Hemoglobin stable post PRBC 2 units Hgb 9.9-- downtrended during readmission, 9.0--8.5--8.5--7.7--7.6 but up to 9.2 on 8/17.  -12/05/23 Hgb 8.3, FOBT neg. 8/21 hgb down to 7.1 then 6.7, no gross blood, stool was ob negative, normocytic. Suspect he has pancytopenia d/t bladder ca. Could be some GI loss related to supratherapeutic INR   -INR therapeutic @ 2.5   -recheck stool x 1 (still pending)   -transfuse pt 1u prbc today    -continue multivitamin,  b complex and add ferrrous sulfate 11. HTN now with hypotension: hx of orthostasis which resolved after IV fluids--now holding home amlodipine  5 mg daily and Carvdilol 12.5 mg - resume as needed. Avoid hypotension             -8/21  blood pressures reasonable while still holding meds, no chnges today   Vitals:   12/05/23 1312 12/05/23 2024 12/06/23 0419 12/06/23 1327  BP: 111/64 (!) 145/82 118/75 126/83   12/06/23 2058 12/07/23 0401 12/07/23 1428 12/07/23 1952  BP: (!) 144/71 126/64 (!) 155/79 125/64   12/08/23 0452 12/08/23 0816 12/08/23 0903 12/08/23 0922  BP: (!) 117/52 123/60 127/78 (!) 126/52      13. HLD: LDL 112, goal < 70, atorvastatin  80mg  daily   14. Obesity: Educate on diet and weight loss to promote overall health and mobility.  Body mass index is 33.06 kg/m.   15. Hx of laryngeal cancer s/p radiation  therapy, voice sounds mildly hoarse  16. GERD: reordered pepcid  20mg  daily PRN since it appears that's how he was using it; might also warrant Protonix ? Monitor.   17. Elevated LFTs: AST 67/ALT 68, alkphos/bili WNL on 8/9. Related to Ca?  -repeat LFTs  perhaps sl improved. Follow up again next week.    -consider changing statin   18. Thrombocytopenia:   -8/21 platelets down to 89k and now 72k  -likely associated with pancytopenia/bladder cancer -monitor serially -check in with oncology to see if they have any recs.  LOS: 5 days A FACE TO FACE EVALUATION WAS PERFORMED  Arthea ONEIDA Gunther 12/08/2023, 10:31 AM

## 2023-12-08 NOTE — Progress Notes (Signed)
 Patient had critical lab value of 6.7 hemoglobin. On call PA Toribio Pitch notified. 1 unit of blood orders per PA. Awaiting type and screen to begin blood transfusion process. Patient resting in bed with call bell within reach.

## 2023-12-08 NOTE — Progress Notes (Addendum)
 PHARMACY - ANTICOAGULATION CONSULT NOTE  Pharmacy Consult:  Heparin  to Warfarin Indication: History of VTE with recurrent CVAs  Allergies  Allergen Reactions   Bee Venom Anaphylaxis    Patient Measurements: Height: 5' 10 (177.8 cm) Weight: 103.2 kg (227 lb 8.2 oz) IBW/kg (Calculated) : 73  Vital Signs: Temp: 97.7 F (36.5 C) (08/21 0452) Temp Source: Oral (08/21 0452) BP: 117/52 (08/21 0452) Pulse Rate: 78 (08/21 0452)  Labs: Recent Labs    12/05/23 1146 12/06/23 0419 12/07/23 0354 12/08/23 0351  HGB 8.3*  --  7.1* 6.7*  HCT 26.7*  --  22.3* 21.4*  PLT 116*  --  89* 72*  LABPROT 62.1* 39.3* 28.3* 27.8*  INR 6.9* 3.8* 2.5* 2.5*  CREATININE  --   --   --  1.19    Estimated Creatinine Clearance: 64.6 mL/min (by C-G formula based on SCr of 1.19 mg/dL).  Assessment: 69 YOM with 2 recent L MCA strokes s/p MT each time.  Patient went to Rehab and was receiving Eliquis , last dose on 8/12 at 0847.  Patient returned to the inpatient side with recurrent CVA on 11/29/23.  MRI shows petechial hemorrhage with new/increased scattered punctate infarcts and new microhemorrhages.  Pharmacy consulted to dose IV heparin  bridge to Coumadin . Stopping Eliquis .  INR remains 2.5 today after vit k on 8/18. Hgb down to 6.7>>PRBC x1. Plts remains <100k without clear cause from meds.   Addendum  Ok to to replace k with 40meq x1 per Rolan Pitch.  Goal of Therapy:  INR 2 to 3 Monitor platelets by anticoagulation protocol: Yes  Plan:  Coumadin  3mg  PO x1 Check INR daily while on warfarin Continue to monitor H&H and platelets   Sergio Batch, PharmD, BCIDP, AAHIVP, CPP Infectious Disease Pharmacist

## 2023-12-08 NOTE — Progress Notes (Signed)
 Occupational Therapy Session Note  Patient Details  Name: Allen Macdonald MRN: [id number] Date of Birth: Feb 16, 1951  Today's Date: 12/08/2023 OT Individual Time: 8695-8652 OT Individual Time Calculation (min): 43 min    Short Term Goals: Week 1:  OT Short Term Goal 1 (Week 1): Pt will complete UB dressing with verbal cues only. OT Short Term Goal 2 (Week 1): Pt will complete toileting with MIN A using LRAD OT Short Term Goal 3 (Week 1): Pt will complete toilet transfers with CGA using LRAD OT Short Term Goal 4 (Week 1): Pt will complete LB dressing CGA using AE and LRAD PRN  Skilled Therapeutic Interventions/Progress Updates:   Pt greeted seated in recliner, pt agreeable to OT intervention.      Transfers/bed mobility/functional mobility:  Pt completed functional ambulation with RW and CGA.   Therapeutic activity:  Pt completed standing dynamic reaching task with RUE with pt instructed to place horseshoes on basektball goal to simulate functional reaching.pt completed task with supervision with unilateral support, pt able to achieve ~ 110 * of shoulder flexion. Graded task up and donned 4lb weight to RUE with pt instructed to remove horeseshoes from hoop with a focus on improving RUE strength. Pt completed same task with CGA.   Worked on seated bimanual tasks with pt given 2lb dowel with bean bag balanced on dowel and pt instructed to elevate dowel to shoulder height to facilitate improved gross motor movement for improved praxis. Pt completed x5 reps with pt able to keep bean bag balanced on bar.    Exercises:  Pt completed below BUE therex with 4lb weighted ball to improve BUE strengthening and promote bimanual integration:  X10 chest presses X10 wood chops to R and L side  Pt completed below theraband exercies to promote RUE strength: X10 scpaular protraction/retraction  X10 Forward punches X 10 Bicep curls X 10 Pulldowns   3 mins of continuos BUE aerobic activity with pt  using 2lb dowel to toss beach ball back/forth.                 Ended session with pt seated in recliner with all needs within reach and chair alarm activated.                   Therapy Documentation Precautions:  Precautions Precautions: Fall Recall of Precautions/Restrictions: Intact Precaution/Restrictions Comments: bilat nephrostomy bags, fall, aphasia, mild R hemi Restrictions Weight Bearing Restrictions Per Provider Order: No  Pain: no pain     Therapy/Group: Individual Therapy  Ronal Gift [facility] 12/08/2023, 2:09 PM

## 2023-12-08 NOTE — Progress Notes (Signed)
 Physical Therapy Session Note  Patient Details  Name: Allen Macdonald MRN: [id number] Date of Birth: 03/11/1951  Today's Date: 12/08/2023 PT Individual Time: 0812-0900; 1402 - 1435 PT Individual Time Calculation (min): 48 min; 33 min   Short Term Goals: Week 1:  PT Short Term Goal 1 (Week 1): Pt will complete transfers with CGA consistently PT Short Term Goal 2 (Week 1): Pt will complete up/down 4 steps with CGA and BHRs PT Short Term Goal 3 (Week 1): Pt will ambulate 150' with RW CGA  SESSION 1 Skilled Therapeutic Interventions/Progress Updates: Patient sitting in recliner with nsg present on entrance to room. Patient alert and agreeable to PT session.   Patient reported no pain, only feeling a little weaker today. Nsg reported low albumin  and that pt will be receiving blood at some point. Pt cleared to participate in therapy by covering physician (pt missed time initially in order for PTA to communicate with appropriate channels if pt was safe to participate in therapy). PT to pt tolerance. Vitals assessed at beginning and noted below  Therapeutic Activity: Transfers: Pt performed sit<>stand transfers throughout session with supervision to RW. Provided VC for pt to recall using B UE's to push off (pt recalled hand placement of one hand on RW and other to push off, but pt performs transfer with less LOA when using B UE's).  - Pt ambulated from main gym<room at end of session in RW with close supervision and pt reporting 5/10 on modified Borg (RPE). VC provided initially for pt to recall breathing sequence.  Neuromuscular Re-ed: NMR facilitated during session with focus on dynamic standing balance, coordination, proprioceptive feedback, weight shifting. - Pt step to 6 step alternating B LE's with R HHA and overall minA. Pt required increased effort to advance L LE onto step due to R hemiparesis. Pt performed 2 rounds with rest breaks required and VC throughout to weight shift accordingly.   - Pt standing in front of mirror with 2.5lb ankle weight donned on R wrist. Pt cued to pull squigs off of mirror placed at various heights. Pt with CGA/close supervision and no UE support. Pt able to pull squigs off with increased effort to coordinate grasp on squig head, and light minA to reach squigs on top two corners of mirror. On 2nd round, pt performed in same fashion and was able to reach same squig on upper R corner without assistance, but required light minA to reach squig on top left of mirror. Pt required seated rest breaks. Pt also required cuing at first to use R UE vs L due to decreased attention to R.   NMR performed for improvements in motor control and coordination, balance, sequencing, judgement, and self confidence/ efficacy in performing all aspects of mobility at highest level of independence.   Patient sitting in recliner at end of session with brakes locked, chair alarm set, and all needs within reach.  SESSION 2 Skilled Therapeutic Interventions/Progress Updates: Patient sitting in recliner on entrance to room. Patient alert and agreeable to PT session.   Patient reported no pain during session. Rollator introduced this session with pt noticing having to decrease B UE WB on rollator following education and cue.  Mild abrasion occurred on pt's R dorsal aspect of hand by bumping into door handle at end of session in room. PTA cleaned with alcohol wipe and applied bandage (nsg notified).  Therapeutic Activity: Transfers: Pt performed sit<>stand transfers throughout session with supervision (with RW) and CGA (without AD) with pt recalling  VC to push off with B UE's.  - Pt ambulated from room<main gym in RW without WC follow and pt reporting 5/10 on RPE following. Pt cued to recall breathing sequence. Pt performed in order to improve tolerance to ambulatory distance. Pt ambulated back to room in rollator with supervision and VC to increase step length on R LE to improve stance  time on L.  Neuromuscular Re-ed: - Pt ambulated 82' x 3 without AD and with overall CGA in main gym in order to improve ambulatory balance and increased proprioceptive feedback to R LE. Pt reported 6-7/10 RPE following, and found task to be both cognitively and physically challenging due to having to think more about maintaining balance while ensuring inhaling through nostrils and exhaling through mouth. Pt also demonstrated slight forward flexion to adjust bags on L lower leg without CGA for safety but no anterior LOB. Pt also cued to increase B step length  NMR performed for improvements in motor control and coordination, balance, sequencing, judgement, and self confidence/ efficacy in performing all aspects of mobility at highest level of independence.   Patient sitting in recliner at end of session with brakes locked, chair alarm set, and all needs within reach.       Therapy Documentation Precautions:  Precautions Precautions: Fall Recall of Precautions/Restrictions: Intact Precaution/Restrictions Comments: bilat nephrostomy bags, fall, aphasia, mild R hemi Restrictions Weight Bearing Restrictions Per Provider Order: No  [facility]itas 1st session  BP 123/60 (79); HR 85; SpO2 100%  Therapy/Group: Individual Therapy  Mariaclara Spear A Omega Slager 12/08/2023, 9:06 AM

## 2023-12-08 NOTE — Progress Notes (Signed)
                                                         PROGRESS NOTE    I have made contact to office of Dr. Leverette with [facility] for recommendations. Message was left with triage nurse. Awaiting return call.    Allen Macdonald Allen Macdonald 12/08/2023, 2:30 PM

## 2023-12-09 DIAGNOSIS — I639 Cerebral infarction, unspecified: Secondary | ICD-10-CM

## 2023-12-09 LAB — BPAM RBC
Blood Product Expiration Date: 202509202359
ISSUE DATE / TIME: 202508210858
Unit Type and Rh: 6200

## 2023-12-09 LAB — CBC
HCT: 24.8 % — ABNORMAL LOW (ref 39.0–52.0)
Hemoglobin: 8 g/dL — ABNORMAL LOW (ref 13.0–17.0)
MCH: 30.2 pg (ref 26.0–34.0)
MCHC: 32.3 g/dL (ref 30.0–36.0)
MCV: 93.6 fL (ref 80.0–100.0)
Platelets: 66 K/uL — ABNORMAL LOW (ref 150–400)
RBC: 2.65 MIL/uL — ABNORMAL LOW (ref 4.22–5.81)
RDW: 20.8 % — ABNORMAL HIGH (ref 11.5–15.5)
WBC: 6.5 K/uL (ref 4.0–10.5)
nRBC: 0 % (ref 0.0–0.2)

## 2023-12-09 LAB — TYPE AND SCREEN
ABO/RH(D): A POS
Antibody Screen: NEGATIVE
Unit division: 0

## 2023-12-09 LAB — PROTIME-INR
INR: 3.3 — ABNORMAL HIGH (ref 0.8–1.2)
Prothrombin Time: 34.7 s — ABNORMAL HIGH (ref 11.4–15.2)

## 2023-12-09 MED ORDER — WARFARIN SODIUM 1 MG PO TABS
1.0000 mg | ORAL_TABLET | Freq: Once | ORAL | Status: AC
  Administered 2023-12-09: 1 mg via ORAL
  Filled 2023-12-09: qty 1

## 2023-12-09 MED ORDER — POTASSIUM CHLORIDE CRYS ER 20 MEQ PO TBCR
20.0000 meq | EXTENDED_RELEASE_TABLET | Freq: Every day | ORAL | Status: DC
  Administered 2023-12-09 – 2023-12-19 (×10): 20 meq via ORAL
  Filled 2023-12-09 (×9): qty 1

## 2023-12-09 NOTE — Progress Notes (Signed)
 Occupational Therapy Session Note  Patient Details  Name: Allen Macdonald MRN: [id number] Date of Birth: 1950/06/19  Today's Date: 12/09/2023 OT Individual Time: 0903-1004 OT Individual Time Calculation (min): 61 min    Short Term Goals: Week 1:  OT Short Term Goal 1 (Week 1): Pt will complete UB dressing with verbal cues only. OT Short Term Goal 2 (Week 1): Pt will complete toileting with MIN A using LRAD OT Short Term Goal 3 (Week 1): Pt will complete toilet transfers with CGA using LRAD OT Short Term Goal 4 (Week 1): Pt will complete LB dressing CGA using AE and LRAD PRN  Skilled Therapeutic Interventions/Progress Updates:  Pt greeted seated in recliner, pt agreeable to OT intervention.      Transfers/bed mobility/functional mobility:  Pt completed all functional ambulation with rollator with CGA.  Practiced functional ambulation in apt with pt instructed to ambulate around apt to retrieve wash cloths from various surface heights to simulate IADLs and challenge dynamic balance. Pt completed task with CGA, emphasis on remembering to lock brakes on rollator when reaching out of BOS.   Pt additionally able to complete sit>stands from furniture in apt with MIN A to stand from low recliner and CGA to stand from compliant couch.   Therapeutic activity:  Pt completed dynamic balance task with pt instructed to stand on airex to fold washcloths with a focus on bilateral integration and unsupported standing, pt needed heavy MIN A to step up on airex but completed task with MIN A - CGA.   Pt completed resisted Reaching  task with pt instructed to remove/place squigz on mirror to facilitate improved RUE strength and challenge reactive balance. Pt completed task with CGA, no LOB.   Pt then instructed to Donn squigz on mirror with pt having to match squigz to correct letter with a focus on improving word finding. Pt completed task with 98% accuracy and CGA for balance to stand unsupported.                   Ended session with pt seated in recliner with all needs within reach.  Therapy Documentation Precautions:  Precautions Precautions: Fall Recall of Precautions/Restrictions: Intact Precaution/Restrictions Comments: bilat nephrostomy bags, fall, aphasia, mild R hemi Restrictions Weight Bearing Restrictions Per Provider Order: No  Pain: No pain    Therapy/Group: Individual Therapy  [name] 12/09/2023, 12:16 PM

## 2023-12-09 NOTE — Consult Note (Signed)
 Neuropsychological Consultation Comprehensive Inpatient Rehab   Patient:   Allen Macdonald   DOB:   Apr 14, 1951  MR Number:  [id number]  Location:  [facility] [address] CENTER [address] [place] [address] Dept: [phone] Loc: [phone]           Date of Service:   12/09/2023  Start Time:   8 AM End Time:   9 AM  Provider/Observer:  Norleen Asa, Psy.D.       Clinical Neuropsychologist       Billing Code/Service: [phone]  Reason for Service:    Arrick Dutton A 73 year old male was referred for a neuropsychological consultation regarding coping, adjustment, and cognitive changes following a cerebrovascular accident.  PMHx includes hypertension, DVT/PE on Eliquis , and bladder cancer s/p nephrostomy. No prior psychiatric history. No current psychotropic medications.  In August, after taking his anticoagulant, he became unable to rise from the toilet and was mute and aphasic. He presented to [facility] via EMS with right-sided weakness, left gaze, and aphasia. CT head showed no intracranial abnormality. CTA of the head and neck demonstrated terminal left ICA occlusion with poor flow to left MCA branches. He was not a candidate for TNK due to unknown time of onset and prior Eliquis  use.  On 11/20/2023, he underwent an emergent stent retriever-assisted mechanical thrombectomy of the left carotid terminus and M1 occlusion, achieving TICI 2C reperfusion.  On 11/21/2023, during physical therapy, he experienced left gaze, slurred speech, and right upper extremity weakness. A repeat neurology consultation was initiated. CT head and CTA of the head and neck revealed a new left M1 occlusion and an infarct in the left temporal lobe, with involvement of the temporal, frontal, and parietal lobes. He underwent a repeat M1 thrombectomy due to a dissection of the left M1, with TICI 2B revascularization.  Post-intervention,  there was no improvement in right arm flaccidity or aphasia. He was also found to have age-indeterminate DVTs in the left lower extremity. He received two units of packed red blood cells, and Eliquis  was resumed once HGB stabilized.  He reports gradual improvement in right arm strength. Following therapy evaluations, he was admitted to the comprehensive rehabilitation unit for decreased functional mobility.   Impression/DX:   Attempted to see the patient multiple times this morning between 8 AM and 9 AM but each time I went back to his room the patient was still in the bathroom with last attempt at 8:50 AM.  The patient will be here next week and I will attempt to go see him on Monday.          Electronically Signed   _______________________ Norleen Asa, Psy.D. Clinical Neuropsychologist

## 2023-12-09 NOTE — Progress Notes (Signed)
 Physical Therapy Session Note  Patient Details  Name: Allen Macdonald MRN: [id number] Date of Birth: February 05, 1951  Today's Date: 12/09/2023 PT Individual Time: 1420-1501 PT Individual Time Calculation (min): 41 min   Short Term Goals: Week 1:  PT Short Term Goal 1 (Week 1): Pt will complete transfers with CGA consistently PT Short Term Goal 2 (Week 1): Pt will complete up/down 4 steps with CGA and BHRs PT Short Term Goal 3 (Week 1): Pt will ambulate 150' with RW CGA  Skilled Therapeutic Interventions/Progress Updates: Patient sitting in recliner with family present on entrance to room. Patient alert and agreeable to PT session.   Patient reported no pain during session  Therapeutic Activity: Transfers: Pt performed sit<>stand transfers throughout session with RW with supervision, and CGA without AD for safety. Provided VC for reach back. Pt recalling cue to use B UE's to push off.  Pt modA to doff personal shorts, and demonstrated ability to lift one leg off floor with B UE supported on RW to thread leg out of shorts. Pt sat to recliner and donned personal shorts with VC to increase hip external rotation + flexion.   - Pt ambulated from room<>main gym in RW with supervision and VC to recall breathing sequence of inhaling through nose and exhaling through mouth. Pt reported 5/10 RPE following entrance to main gym. Pt performed in order to increase ambulatory distance for household/community navigation.   Neuromuscular Re-ed: NMR facilitated during session with focus on dynamic standing balance, weight shift, proprioceptive feedback, coordination. - Pt ambulated 156' without AD and with overall CGA/light minA and VC to increase weight shift to L and step length on R. Pt performed in order to improve dynamic standing balance.  - Pt ambulated in multidirectional pattern with overall CGA laterally and anteriorly (pt cued to look in various directions while maintaining cadence with noted gait  deviations and slow down in cadence). Pt cued to perform retro step and required VC to increase step length and manual facilitation of weight shift accordingly.   NMR performed for improvements in motor control and coordination, balance, sequencing, judgement, and self confidence/ efficacy in performing all aspects of mobility at highest level of independence.   Therapeutic Exercise: Pt performed the following exercises with therapist providing the described cuing and facilitation for improvement. - B piriformis/adductor stretch (alternating) 2 x 15 second holds to pt tolerance with pt in figure 4 sitting edge of mat - B hip flexion + external rotation with VC to touch PTA hand on lateral side to increase ROM. 2 rounds till fatigue with VC to breathe throughout.   Patient sitting in recliner at end of session with brakes locked, family present, chair alarm set, and all needs within reach.      Therapy Documentation Precautions:  Precautions Precautions: Fall Recall of Precautions/Restrictions: Intact Precaution/Restrictions Comments: bilat nephrostomy bags, fall, aphasia, mild R hemi Restrictions Weight Bearing Restrictions Per Provider Order: No  Therapy/Group: Individual Therapy  [name] PTA 12/09/2023, 3:04 PM

## 2023-12-09 NOTE — Progress Notes (Signed)
 PHARMACY - ANTICOAGULATION CONSULT NOTE  Pharmacy Consult:  Heparin  to Warfarin Indication: History of VTE with recurrent CVAs  Allergies  Allergen Reactions   Bee Venom Anaphylaxis    Patient Measurements: Height: 5' 10 (177.8 cm) Weight: 103.2 kg (227 lb 8.2 oz) IBW/kg (Calculated) : 73  Vital Signs: Temp: 98.5 F (36.9 C) (08/22 0534) Temp Source: Oral (08/22 0534) BP: 128/64 (08/22 0534) Pulse Rate: 86 (08/22 0534)  Labs: Recent Labs    12/07/23 0354 12/08/23 0351 12/09/23 0430  HGB 7.1* 6.7* 8.0*  HCT 22.3* 21.4* 24.8*  PLT 89* 72* 66*  LABPROT 28.3* 27.8* 34.7*  INR 2.5* 2.5* 3.3*  CREATININE  --  1.19  --     Estimated Creatinine Clearance: 64.6 mL/min (by C-G formula based on SCr of 1.19 mg/dL).  Assessment: 61 YOM with 2 recent L MCA strokes s/p MT each time.  Patient went to Rehab and was receiving Eliquis , last dose on 8/12 at 0847.  Patient returned to the inpatient side with recurrent CVA on 11/29/23.  MRI shows petechial hemorrhage with new/increased scattered punctate infarcts and new microhemorrhages.  Pharmacy consulted to dose IV heparin  bridge to warfarin. Stopped Eliquis .  INR up to 3.3 today after getting PRBCs x 1 on 8/21, Vit k on 8/18. Hgb up to 8 s/p PRBC x1. Plts remains <100k without clear cause from meds.    Goal of Therapy:  INR 2 to 3 Monitor platelets by anticoagulation protocol: Yes  Plan:  Warfarin 1 mg PO x1 Check INR daily while on warfarin Continue to monitor H&H and platelets    Thank you for allowing pharmacy to be a part of this patient's care.   Bascom JAYSON Louder, PharmD 12/09/2023 3:01 PM  **Pharmacist phone directory can be found on [email or web address] listed under [facility]**

## 2023-12-09 NOTE — Progress Notes (Signed)
 Occupational Therapy Session Note  Patient Details  Name: Allen Macdonald MRN: [id number] Date of Birth: 07/14/1950  Today's Date: 12/09/2023 OT Individual Time: 1130-1200 OT Individual Time Calculation (min): 30 min    Short Term Goals: Week 1:  OT Short Term Goal 1 (Week 1): Pt will complete UB dressing with verbal cues only. OT Short Term Goal 2 (Week 1): Pt will complete toileting with MIN A using LRAD OT Short Term Goal 3 (Week 1): Pt will complete toilet transfers with CGA using LRAD OT Short Term Goal 4 (Week 1): Pt will complete LB dressing CGA using AE and LRAD PRN  Skilled Therapeutic Interventions/Progress Updates:    Pt received in recliner ready for therapy.  Pt ambulated with RW and CGA to gym to engage in RUE NMR.  To enhance R shoulder AROM (it is 110 now) had pt work on numerous bilateral and unilateral arm exercises to enable further reach.  His grasp is also improving.   Pt then ambulated back to room with chair alarm on and all needs met.   Therapy Documentation Precautions:  Precautions Precautions: Fall Recall of Precautions/Restrictions: Intact Precaution/Restrictions Comments: bilat nephrostomy bags, fall, aphasia, mild R hemi Restrictions Weight Bearing Restrictions Per Provider Order: No General:   Vital Signs: Therapy Vitals Temp: 98.5 F (36.9 C) Temp Source: Oral Pulse Rate: 86 Resp: 18 BP: 128/64 Patient Position (if appropriate): Lying Oxygen Therapy SpO2: 97 % O2 Device: Room Air Pain:   ADL: ADL Grooming: Contact guard (in standing at sink, able to manipulate containers and slightly squeeze toothpast onto toothbrush with Rt hand) Where Assessed-Grooming: Standing at sink Upper Body Bathing: Supervision/safety Where Assessed-Upper Body Bathing: Wheelchair Where Assessed-Lower Body Bathing: Wheelchair Upper Body Dressing: Supervision/safety Lower Body Dressing: Moderate assistance Where Assessed-Lower Body Dressing: Chair Toileting:  Moderate assistance Toilet Transfer Method: Ambulating Tub/Shower Transfer: Not assessed Film/video editor: Not assessed ADL Comments: pt sponge bathes at baseline due to ports and drains   Therapy/Group: Individual Therapy  [name] 12/09/2023, 8:58 AM

## 2023-12-09 NOTE — Progress Notes (Signed)
 Speech Language Pathology Daily Session Note  Patient Details  Name: Allen Macdonald MRN: [id number] Date of Birth: 1950-09-17  Today's Date: 12/09/2023 SLP Individual Time: 8983-8885 SLP Individual Time Calculation (min): 58 min  Short Term Goals: Week 1: SLP Short Term Goal 1 (Week 1): Pt will complete mildly complex naming tasks w/ minA SLP Short Term Goal 2 (Week 1): Pt will follow multi step commands with min A verbal and visual cues. SLP Short Term Goal 3 (Week 1): Pt will complete functional wirtten expression tasks with min A. SLP Short Term Goal 4 (Week 1): Pt will formulate sentences using word finding strategies to communticate his wants and needs with min a. SLP Short Term Goal 5 (Week 1): Pt will read and comprehend functional written information with min A.  Skilled Therapeutic Interventions: Skilled therapy session focused on communication goals. SLP facilitated session by targeting confrontational naming. Patient did excellent this date, achieving 96% accuracy given min A with x1 semantic paraphasia. SLP then prompted phrase completion activity. Patient with 87% accuracy given minA. Due to patients improvement in structured task, SLP challenged patient in simple conversation. Patient with occasional paraphasias and apraxic errors, though able to express simple thoughts with minA. Lastly, SLP targeted written expression by prompting patient to write functional words given model. Patient with occasional substitution of various words. Patient left in chair with alarm set and call bell in reach. Continue POC.   Pain denies  Therapy/Group: Individual Therapy  Joseline Mccampbell M.A., CCC-SLP 12/09/2023, 7:44 AM

## 2023-12-09 NOTE — Progress Notes (Signed)
 PROGRESS NOTE   Subjective/Complaints:  Pt up in gym. Feels better today after blood. No new complaints.   ROS: Patient denies fever, rash, sore throat, blurred vision, dizziness, nausea, vomiting, diarrhea, cough, shortness of breath or chest pain, joint or back/neck pain, headache, or mood change.   Objective:   No results found. Recent Labs    12/08/23 0351 12/09/23 0430  WBC 5.9 6.5  HGB 6.7* 8.0*  HCT 21.4* 24.8*  PLT 72* 66*   Recent Labs    12/08/23 0351  NA 143  K 3.3*  CL 108  CO2 23  GLUCOSE 97  BUN 21  CREATININE 1.19  CALCIUM  8.3*        Intake/Output Summary (Last 24 hours) at 12/09/2023 1032 Last data filed at 12/09/2023 0700 Gross per 24 hour  Intake 670 ml  Output 1250 ml  Net -580 ml        Physical Exam: Vital Signs Blood pressure 128/64, pulse 86, temperature 98.5 F (36.9 C), temperature source Oral, resp. rate 18, height 5' 10 (1.778 m), weight 103.2 kg, SpO2 97%.  Constitutional: No distress . Vital signs reviewed. HEENT: NCAT, EOMI, oral membranes moist Neck: supple Cardiovascular: RRR without murmur. No JVD    Respiratory/Chest: CTA Bilaterally without wheezes or rales. Normal effort    GI/Abdomen: BS +, non-tender, non-distended Ext: no clubbing, cyanosis, or edema Psych: pleasant and cooperative  Uro: nephrostomy tubes in place with clear urine Skin: intact over exposed surfaces, R hand IV remains c/d/I. Scattered bruises on forearms present, NSL right arm. Pallor has improved Neuro: Alert and oriented x2, right sided strength is 4/5, otherwise strength is 5/5. Sensory exam normal for light touch and pain in all 4 limbs. No limb ataxia or cerebellar signs. No abnormal tone appreciated.  Halting speech, non-fluent that can fluctuate. Fair standing balance  Assessment/Plan: 1. Functional deficits which require 3+ hours per day of interdisciplinary therapy in a comprehensive  inpatient rehab setting. Physiatrist is providing close team supervision and 24 hour management of active medical problems listed below. Physiatrist and rehab team continue to assess barriers to discharge/monitor patient progress toward functional and medical goals  Care Tool:  Bathing    Body parts bathed by patient: Right arm, Left arm, Chest, Abdomen, Right upper leg, Left upper leg, Front perineal area, Face   Body parts bathed by helper: Buttocks, Right lower leg, Left lower leg     Bathing assist Assist Level: Minimal Assistance - Patient > 75%     Upper Body Dressing/Undressing Upper body dressing   What is the patient wearing?: Pull over shirt    Upper body assist Assist Level: Supervision/Verbal cueing    Lower Body Dressing/Undressing Lower body dressing      What is the patient wearing?: Pants, Incontinence brief     Lower body assist Assist for lower body dressing: Minimal Assistance - Patient > 75%     Toileting Toileting    Toileting assist Assist for toileting: Moderate Assistance - Patient 50 - 74%     Transfers Chair/bed transfer  Transfers assist  Chair/bed transfer activity did not occur: Safety/medical concerns  Chair/bed transfer assist level: Supervision/Verbal cueing  Locomotion Ambulation   Ambulation assist      Assist level: Contact Guard/Touching assist Assistive device: Walker-rolling Max distance: 153   Walk 10 feet activity   Assist     Assist level: Contact Guard/Touching assist Assistive device: Walker-rolling   Walk 50 feet activity   Assist    Assist level: Contact Guard/Touching assist Assistive device: Walker-rolling    Walk 150 feet activity   Assist Walk 150 feet activity did not occur: Safety/medical concerns (fatigue)  Assist level: Contact Guard/Touching assist Assistive device: Walker-rolling    Walk 10 feet on uneven surface  activity   Assist Walk 10 feet on uneven surfaces  activity did not occur: Safety/medical concerns (fatigue)         Wheelchair     Assist Is the patient using a wheelchair?: Yes Type of Wheelchair: Manual    Wheelchair assist level: Dependent - Patient 0%      Wheelchair 50 feet with 2 turns activity    Assist        Assist Level: Dependent - Patient 0%   Wheelchair 150 feet activity     Assist      Assist Level: Dependent - Patient 0%   Blood pressure 128/64, pulse 86, temperature 98.5 F (36.9 C), temperature source Oral, resp. rate 18, height 5' 10 (1.778 m), weight 103.2 kg, SpO2 97%.  Medical Problem List and Plan: 1. Functional deficits secondary to left MCA scattered infarcts with left ICA and left MCA occlusion s/p IR with TICI2c followed by L ICA and basilar tip emboli s/p mechanical thrombectomy TIC3, likely due to hypercoagulable state in setting of cancer             -patient may shower, cover incision/nephrostomy tubes             -ELOS/Goals: 12/17/23.  PT/OT sup to min A, SLP supervision            -Continue CIR therapies including PT, OT, and SLP   2.  Antithrombotics: -DVT: hx of multiple BL DVT and PE had IVC filter placed 2014  -US  LE positive for age indeterminate DVTs: RT - PopV and PTV LT - CFV, PFV, FV, PopV, Gastrocs  -Probable hypercoagulable state r/t carcinoma  -Pharmaceutical: previously Eliquis  5mg  BID,  heparin  drip transitioned to   coumadin    -12/04/23 Hgb up to 9.2, so improved greatly; FOBT neg  8/22 INR 3.3, appreciate pharmacy assistance   -adjust warfarin per protocol             -antiplatelet therapy: N/A 3. Pain Management: Tylenol  prn  4. Mood/Behavior/Sleep: LCSW to follow for evaluation and support when available.              -antipsychotic agents: N/A  -melatonin PRN 5. Neuropsych/cognition: This patient may be capable of making decisions on his own behalf. 6. Skin/Wound Care: routine pressure relief measures    7. Fluids/Electrolytes/Nutrition: Monitor I/O   - Per speech past swallow study continue regular diet w/ Ensure and multivitamin supplements-- reordered from prior admission orders   -encouraging po 8/21- potassium 3.3---supplement.--rechecking Monday   8.  Bladder Cancer s/p nephrostomy tubes: stage IV, metastatic to the bone by MRI of the spine on 11/08/2023.  -Hx of prostate CA, on androgen therapy- leupron q6mos-- follows with [facility] Heme/Onc -prior UTI: Ceftriaxone  1g daily completed 8/10   9. AKI: Monitor BMP w/GFR  Cr 1.22--1.42--1.52>> 1.16 on 8/13.   - 8/18 Cr 1.3 today, ?baseline renal insufficiency?  -encourage fluids  10.  Anemia: Hemoglobin stable post PRBC 2 units Hgb 9.9-- downtrended during readmission, 9.0--8.5--8.5--7.7--7.6 but up to 9.2 on 8/17.  -12/05/23 Hgb 8.3, FOBT neg. 8/22 hgb up to 8.0 today after 1u PRBC yesterday. -anemia is normocytic. Suspect he has pancytopenia d/t bladder ca. Could be some GI loss related to supratherapeutic INR   -avoid supratherapeutic INR's   -recheck stool x 1 (still waiting on another sample)   -continue multivitamin,  b complex and add ferrrous sulfate 11. HTN now with hypotension: hx of orthostasis which resolved after IV fluids--now holding home amlodipine  5 mg daily and Carvdilol 12.5 mg - resume as needed. Avoid hypotension             -8/22  blood pressures reasonable while still holding meds, no chnges today   Vitals:   12/06/23 2058 12/07/23 0401 12/07/23 1428 12/07/23 1952  BP: (!) 144/71 126/64 (!) 155/79 125/64   12/08/23 0452 12/08/23 0816 12/08/23 0903 12/08/23 0922  BP: (!) 117/52 123/60 127/78 (!) 126/52   12/08/23 1219 12/08/23 1503 12/08/23 1929 12/09/23 0534  BP: 138/82 127/68 134/66 128/64      13. HLD: LDL 112, goal < 70, atorvastatin  80mg  daily   14. Obesity: Educate on diet and weight loss to promote overall health and mobility.  Body mass index is 33.06 kg/m.   15. Hx of laryngeal cancer s/p radiation therapy, voice sounds mildly hoarse  16. GERD:  reordered pepcid  20mg  daily PRN since it appears that's how he was using it; might also warrant Protonix ? Monitor.   17. Elevated LFTs: AST 67/ALT 68, alkphos/bili WNL on 8/9. Related to Ca?  -repeat LFTs  perhaps sl improved. Follow up again 8/25    -consider changing statin   18. Thrombocytopenia/pancytopenia:   -8/22 platelets down to 89k -> 72k -> 66k  -likely d/t metastatic bladder cancer -monitor serially -checking in with oncology to see if they have any recs.  LOS: 6 days A FACE TO FACE EVALUATION WAS PERFORMED  Arthea ONEIDA Gunther 12/09/2023, 10:32 AM

## 2023-12-10 LAB — PROTIME-INR
INR: 3.5 — ABNORMAL HIGH (ref 0.8–1.2)
Prothrombin Time: 36.7 s — ABNORMAL HIGH (ref 11.4–15.2)

## 2023-12-10 NOTE — Plan of Care (Signed)
  Problem: Consults Goal: RH STROKE PATIENT EDUCATION Description: See Patient Education module for education specifics  Outcome: Progressing   

## 2023-12-10 NOTE — Progress Notes (Signed)
 PROGRESS NOTE   Subjective/Complaints:  Pt doing well, slept well, denies pain, feels better since his transfusion. LBM this morning. Nephrostomy tubes doing fine. No other complaints or concerns.   ROS: as per HPI. Denies CP, SOB, abd pain, N/V/D/C, or any other complaints at this time.    Objective:   No results found. Recent Labs    12/08/23 0351 12/09/23 0430  WBC 5.9 6.5  HGB 6.7* 8.0*  HCT 21.4* 24.8*  PLT 72* 66*   Recent Labs    12/08/23 0351  NA 143  K 3.3*  CL 108  CO2 23  GLUCOSE 97  BUN 21  CREATININE 1.19  CALCIUM  8.3*        Intake/Output Summary (Last 24 hours) at 12/10/2023 1018 Last data filed at 12/10/2023 0913 Gross per 24 hour  Intake 476 ml  Output 1250 ml  Net -774 ml        Physical Exam: Vital Signs Blood pressure (!) 111/55, pulse 76, temperature 98.5 F (36.9 C), temperature source Oral, resp. rate 16, height 5' 10 (1.778 m), weight 103.2 kg, SpO2 100%.  Constitutional: No distress . Vital signs reviewed. Sitting up in w/c HEENT: NCAT, EOMI, oral membranes moist Neck: supple Cardiovascular: RRR without murmur. No JVD    Respiratory/Chest: CTA Bilaterally without wheezes or rales. Normal effort    GI/Abdomen: BS +, non-tender, non-distended Ext: no clubbing or cyanosis, trace b/l edema Psych: pleasant and cooperative, but flat Uro: nephrostomy tubes in place with yellow urine Skin: intact over exposed surfaces, R hand IV remains c/d/I. Scattered bruises on forearms present, NSL right arm. Pallor has improved  PRIOR EXAMS: Neuro: Alert and oriented x2, right sided strength is 4/5, otherwise strength is 5/5. Sensory exam normal for light touch and pain in all 4 limbs. No limb ataxia or cerebellar signs. No abnormal tone appreciated.  Halting speech, non-fluent that can fluctuate. Fair standing balance  Assessment/Plan: 1. Functional deficits which require 3+ hours per day  of interdisciplinary therapy in a comprehensive inpatient rehab setting. Physiatrist is providing close team supervision and 24 hour management of active medical problems listed below. Physiatrist and rehab team continue to assess barriers to discharge/monitor patient progress toward functional and medical goals  Care Tool:  Bathing    Body parts bathed by patient: Right arm, Left arm, Chest, Abdomen, Right upper leg, Left upper leg, Front perineal area, Face   Body parts bathed by helper: Buttocks, Right lower leg, Left lower leg     Bathing assist Assist Level: Minimal Assistance - Patient > 75%     Upper Body Dressing/Undressing Upper body dressing   What is the patient wearing?: Pull over shirt    Upper body assist Assist Level: Supervision/Verbal cueing    Lower Body Dressing/Undressing Lower body dressing      What is the patient wearing?: Pants, Incontinence brief     Lower body assist Assist for lower body dressing: Minimal Assistance - Patient > 75%     Toileting Toileting    Toileting assist Assist for toileting: Moderate Assistance - Patient 50 - 74%     Transfers Chair/bed transfer  Transfers assist  Chair/bed transfer activity  did not occur: Safety/medical concerns  Chair/bed transfer assist level: Supervision/Verbal cueing     Locomotion Ambulation   Ambulation assist      Assist level: Contact Guard/Touching assist Assistive device: Walker-rolling Max distance: 153   Walk 10 feet activity   Assist     Assist level: Contact Guard/Touching assist Assistive device: Walker-rolling   Walk 50 feet activity   Assist    Assist level: Contact Guard/Touching assist Assistive device: Walker-rolling    Walk 150 feet activity   Assist Walk 150 feet activity did not occur: Safety/medical concerns (fatigue)  Assist level: Contact Guard/Touching assist Assistive device: Walker-rolling    Walk 10 feet on uneven surface   activity   Assist Walk 10 feet on uneven surfaces activity did not occur: Safety/medical concerns (fatigue)         Wheelchair     Assist Is the patient using a wheelchair?: Yes Type of Wheelchair: Manual    Wheelchair assist level: Dependent - Patient 0%      Wheelchair 50 feet with 2 turns activity    Assist        Assist Level: Dependent - Patient 0%   Wheelchair 150 feet activity     Assist      Assist Level: Dependent - Patient 0%   Blood pressure (!) 111/55, pulse 76, temperature 98.5 F (36.9 C), temperature source Oral, resp. rate 16, height 5' 10 (1.778 m), weight 103.2 kg, SpO2 100%.  Medical Problem List and Plan: 1. Functional deficits secondary to left MCA scattered infarcts with left ICA and left MCA occlusion s/p IR with TICI2c followed by L ICA and basilar tip emboli s/p mechanical thrombectomy TIC3, likely due to hypercoagulable state in setting of cancer             -patient may shower, cover incision/nephrostomy tubes             -ELOS/Goals: 12/17/23.  PT/OT sup to min A, SLP supervision            -Continue CIR therapies including PT, OT, and SLP   2.  Antithrombotics: -DVT: hx of multiple BL DVT and PE had IVC filter placed 2014  -US  LE positive for age indeterminate DVTs: RT - PopV and PTV LT - CFV, PFV, FV, PopV, Gastrocs  -Probable hypercoagulable state r/t carcinoma  -Pharmaceutical: previously Eliquis  5mg  BID,  heparin  drip transitioned to   coumadin    -12/04/23 Hgb up to 9.2, so improved greatly; FOBT neg  -12/10/23 INR 3.5, appreciate pharmacy assistance   -adjust warfarin per protocol             -antiplatelet therapy: N/A 3. Pain Management: Tylenol  prn  4. Mood/Behavior/Sleep: LCSW to follow for evaluation and support when available.              -antipsychotic agents: N/A  -melatonin PRN 5. Neuropsych/cognition: This patient may be capable of making decisions on his own behalf. 6. Skin/Wound Care: routine pressure  relief measures    7. Fluids/Electrolytes/Nutrition: Monitor I/O  - Per speech past swallow study continue regular diet w/ Ensure and multivitamin supplements-- reordered from prior admission orders   -encouraging po 8/21- potassium 3.3---supplement.--rechecking Monday   8.  Bladder Cancer s/p nephrostomy tubes: stage IV, metastatic to the bone by MRI of the spine on 11/08/2023.  -Hx of prostate CA, on androgen therapy- leupron q6mos-- follows with [facility] Heme/Onc -prior UTI: Ceftriaxone  1g daily completed 8/10   9. AKI: Monitor BMP w/GFR  Cr  1.22--1.42--1.52>> 1.16 on 8/13.   - 8/18 Cr 1.3 today, ?baseline renal insufficiency?  -encourage fluids -12/10/23 Cr 1.19 yesterday, monitor 10.  Anemia: Hemoglobin stable post PRBC 2 units Hgb 9.9-- downtrended during readmission, 9.0--8.5--8.5--7.7--7.6 but up to 9.2 on 8/17.  -12/05/23 Hgb 8.3, FOBT neg. 8/22 hgb up to 8.0 today after 1u PRBC yesterday. -anemia is normocytic. Suspect he has pancytopenia d/t bladder ca. Could be some GI loss related to supratherapeutic INR   -avoid supratherapeutic INR's   -recheck stool x 1 (still waiting on another sample)   -continue multivitamin,  b complex and add ferrous sulfate  11. HTN now with hypotension: hx of orthostasis which resolved after IV fluids--now holding home amlodipine  5 mg daily and Carvdilol 12.5 mg - resume as needed. Avoid hypotension -8/22-23  blood pressures reasonable while still holding meds, no changes   Vitals:   12/07/23 1952 12/08/23 0452 12/08/23 0816 12/08/23 0903  BP: 125/64 (!) 117/52 123/60 127/78   12/08/23 0922 12/08/23 1219 12/08/23 1503 12/08/23 1929  BP: (!) 126/52 138/82 127/68 134/66   12/09/23 0534 12/09/23 1652 12/09/23 2027 12/10/23 0432  BP: 128/64 133/67 (!) 145/75 (!) 111/55      13. HLD: LDL 112, goal < 70, atorvastatin  80mg  daily   14. Obesity: Educate on diet and weight loss to promote overall health and mobility.  Body mass index is 33.06 kg/m.   15.  Hx of laryngeal cancer s/p radiation therapy, voice sounds mildly hoarse  16. GERD: reordered pepcid  20mg  daily PRN since it appears that's how he was using it; might also warrant Protonix ? Monitor.   17. Elevated LFTs: AST 67/ALT 68, alkphos/bili WNL on 8/9. Related to Ca?  -repeat LFTs  perhaps sl improved. Follow up again 8/25    -consider changing statin   18. Thrombocytopenia/pancytopenia:   -8/22 platelets down to 89k -> 72k -> 66k  -likely d/t metastatic bladder cancer -monitor serially -checking in with oncology to see if they have any recs.    LOS: 7 days A FACE TO FACE EVALUATION WAS PERFORMED  [id number] 12/10/2023, 10:18 AM

## 2023-12-10 NOTE — Progress Notes (Signed)
 PHARMACY - ANTICOAGULATION CONSULT NOTE  Pharmacy Consult:  Heparin  to Warfarin Indication: History of VTE with recurrent CVAs  Allergies  Allergen Reactions   Bee Venom Anaphylaxis    Patient Measurements: Height: 5' 10 (177.8 cm) Weight: 103.2 kg (227 lb 8.2 oz) IBW/kg (Calculated) : 73  Vital Signs: Temp: 98 F (36.7 C) (08/23 1431) Temp Source: Oral (08/23 1431) BP: 131/64 (08/23 1431) Pulse Rate: 88 (08/23 1431)  Labs: Recent Labs    12/08/23 0351 12/09/23 0430 12/10/23 0413  HGB 6.7* 8.0*  --   HCT 21.4* 24.8*  --   PLT 72* 66*  --   LABPROT 27.8* 34.7* 36.7*  INR 2.5* 3.3* 3.5*  CREATININE 1.19  --   --     Estimated Creatinine Clearance: 64.6 mL/min (by C-G formula based on SCr of 1.19 mg/dL).  Assessment: 57 YOM with 2 recent L MCA strokes s/p MT each time.  Patient went to Rehab and was receiving Eliquis , last dose on 8/12 at 0847.  Patient returned to the inpatient side with recurrent CVA on 11/29/23.  MRI shows petechial hemorrhage with new/increased scattered punctate infarcts and new microhemorrhages.  Pharmacy consulted to dose IV heparin  bridge to warfarin. Stopped Eliquis .  INR up to 3.5 today. PRBCs x 1 on 8/21, Vit k on 8/18.  8/22: Hgb up to 8 s/p PRBC x1. Plts remains <100k without clear cause from meds.  No issues with bleeding reported.   Goal of Therapy:  INR 2 to 3 Monitor platelets by anticoagulation protocol: Yes  Plan:  Hold warfarin x 1 Check INR daily while on warfarin Continue to monitor H&H and platelets    Thank you for allowing pharmacy to be a part of this patient's care.   Bascom JAYSON Louder, PharmD 12/10/2023 2:40 PM  **Pharmacist phone directory can be found on [email or web address] listed under [facility]**

## 2023-12-10 NOTE — Progress Notes (Signed)
 Physical Therapy Session Note  Patient Details  Name: Allen Macdonald MRN: [id number] Date of Birth: 08-15-1950  Today's Date: 12/10/2023 PT Individual Time: 8966-8885 PT Individual Time Calculation (min): 41 min   Short Term Goals: Week 1:  PT Short Term Goal 1 (Week 1): Pt will complete transfers with CGA consistently PT Short Term Goal 2 (Week 1): Pt will complete up/down 4 steps with CGA and BHRs PT Short Term Goal 3 (Week 1): Pt will ambulate 150' with RW CGA  Skilled Therapeutic Interventions/Progress Updates: Patient sitting in recliner on entrance to room. Patient alert and agreeable to PT session.   Patient reported no pain during session  Therapeutic Activity: Transfers: Pt performed sit<>stand transfers throughout session with supervision to rollator. Pt recalled previous cuing of locking brakes on rollator and hand placement without intervention (pt with increased time to think about hand placement/locks, but was able to do so prior to transferring).   - Pt doffed/donned socks B LE's with minA to place lower leg on contralateral knee at beginning of session.  - Pt ambulated from room<outside of WCC in rollator with supervision and several rest breaks required. Pt cued to self assess when needing to rest (when pt reaches 7/10 on RPE). Pt required VC throughout to increase step clearance/length on R LE on non-compliant surfaces. Pt also cued to maintain safe proximity to rollator and to increase upright posture. PTA adjusted rollator with pt showing decreased in forward flexion.   Neuromuscular Re-ed: NMR facilitated during session with focus on dynamic standing balance, coordination, dual task. - Pt ambulating short distance in ortho gym while holding two cones with disc stacked between wide base in L UE. Pt with close supervision and no LOB. Pt reported increase in cognitive load with task due to needing to focus on balancing self and cones and maintaining breathing sequence of  inhaling through nose and exhaling through mouth. Pt performed 2 rounds with pt increasing distance on 2nd round vs 1st before needing rest break.   NMR performed for improvements in motor control and coordination, balance, sequencing, judgement, and self confidence/ efficacy in performing all aspects of mobility at highest level of independence.   Patient sitting in recliner at end of session with brakes locked, chair alarm set, and all needs within reach.      Therapy Documentation Precautions:  Precautions Precautions: Fall Recall of Precautions/Restrictions: Intact Precaution/Restrictions Comments: bilat nephrostomy bags, fall, aphasia, mild R hemi Restrictions Weight Bearing Restrictions Per Provider Order: No   Therapy/Group: Individual Therapy  Ziasia Lenoir PTA 12/10/2023, 12:11 PM

## 2023-12-11 LAB — PROTIME-INR
INR: 2.9 — ABNORMAL HIGH (ref 0.8–1.2)
Prothrombin Time: 31.5 s — ABNORMAL HIGH (ref 11.4–15.2)

## 2023-12-11 MED ORDER — WARFARIN SODIUM 1 MG PO TABS
1.0000 mg | ORAL_TABLET | Freq: Once | ORAL | Status: AC
  Administered 2023-12-11: 1 mg via ORAL
  Filled 2023-12-11: qty 1

## 2023-12-11 NOTE — Progress Notes (Signed)
 PHARMACY - ANTICOAGULATION CONSULT NOTE  Pharmacy Consult:  Heparin  to Warfarin Indication: History of VTE with recurrent CVAs  Allergies  Allergen Reactions   Bee Venom Anaphylaxis    Patient Measurements: Height: 5' 10 (177.8 cm) Weight: 104.9 kg (231 lb 4.2 oz) IBW/kg (Calculated) : 73  Vital Signs: Temp: 97.7 F (36.5 C) (08/24 0339) Temp Source: Oral (08/24 0339) BP: 125/69 (08/24 0339) Pulse Rate: 92 (08/24 0339)  Labs: Recent Labs    12/09/23 0430 12/10/23 0413 12/11/23 0503  HGB 8.0*  --   --   HCT 24.8*  --   --   PLT 66*  --   --   LABPROT 34.7* 36.7* 31.5*  INR 3.3* 3.5* 2.9*    Estimated Creatinine Clearance: 65.1 mL/min (by C-G formula based on SCr of 1.19 mg/dL).  Assessment: 83 YOM with 2 recent L MCA strokes s/p MT each time.  Patient went to Rehab and was receiving Eliquis , last dose on 8/12 at 0847.  Patient returned to the inpatient side with recurrent CVA on 11/29/23.  MRI shows petechial hemorrhage with new/increased scattered punctate infarcts and new microhemorrhages.  Pharmacy consulted to dose IV heparin  bridge to warfarin. Stopped Eliquis .   8/24: INR 2.9 (down from 3.5 following held dose 8/23).    Goal of Therapy:  INR 2 to 3 Monitor platelets by anticoagulation protocol: Yes  Plan:  Warfarin 1 mg PO x1  Check INR daily while on warfarin CBC Mon/Thurs  Continue to monitor H&H and platelets  Massie Fila, PharmD Clinical Pharmacist  12/11/2023 8:08 AM

## 2023-12-11 NOTE — Progress Notes (Signed)
 Speech Language Pathology Daily Session Note  Patient Details  Name: Allen Macdonald MRN: [id number] Date of Birth: February 08, 1951  Today's Date: 12/11/2023 SLP Individual Time: 0910-1015 SLP Individual Time Calculation (min): 65 min  Short Term Goals: Week 1: SLP Short Term Goal 1 (Week 1): Pt will complete mildly complex naming tasks w/ minA SLP Short Term Goal 2 (Week 1): Pt will follow multi step commands with min A verbal and visual cues. SLP Short Term Goal 3 (Week 1): Pt will complete functional wirtten expression tasks with min A. SLP Short Term Goal 4 (Week 1): Pt will formulate sentences using word finding strategies to communticate his wants and needs with min a. SLP Short Term Goal 5 (Week 1): Pt will read and comprehend functional written information with min A.  Skilled Therapeutic Interventions:   Pt was seen for skilled ST services targeting language. Pt was alert and sitting in recliner upon SLP arrival. Pt was agreeable to receiving therapy services early. SLP facilitated expressive language goals through [employer] (SFA). Pt was given a personally-relevant object and asked to describe that object by feature. Pt required overall modA verbal and written cues to complete. Pt reported he felt like he struggled with this task. Conversationally, pt was able to express thoughts to basic questions with occasional prompting and min-modA cueing. Limited apraxic errors observed this session. Pt was left in room, with all immediate needs within reach, and safety measures activated. Continue with current POC.  Pain Pain Assessment Pain Score: 0-No pain  Therapy/Group: Individual Therapy  Lacinda KANDICE Lorenzo 12/11/2023, 12:17 PM

## 2023-12-11 NOTE — Progress Notes (Signed)
 PROGRESS NOTE   Subjective/Complaints:  Pt doing well again, slept well, denies pain. LBM yesterday. Nephrostomy tubes doing fine. No other complaints or concerns.   ROS: as per HPI. Denies CP, SOB, abd pain, N/V/D/C, or any other complaints at this time.    Objective:   No results found. Recent Labs    12/09/23 0430  WBC 6.5  HGB 8.0*  HCT 24.8*  PLT 66*   No results for input(s): NA, K, CL, CO2, GLUCOSE, BUN, CREATININE, CALCIUM  in the last 72 hours.       Intake/Output Summary (Last 24 hours) at 12/11/2023 0959 Last data filed at 12/11/2023 0707 Gross per 24 hour  Intake 945 ml  Output 950 ml  Net -5 ml        Physical Exam: Vital Signs Blood pressure 125/69, pulse 92, temperature 97.7 F (36.5 C), temperature source Oral, resp. rate 18, height 5' 10 (1.778 m), weight 104.9 kg, SpO2 99%.  Constitutional: No distress . Vital signs reviewed. Sitting up in w/c watching TV HEENT: NCAT, EOMI, oral membranes moist Neck: supple Cardiovascular: RRR without murmur. No JVD    Respiratory/Chest: CTA Bilaterally without wheezes or rales. Normal effort, port R chest  GI/Abdomen: BS +, non-tender, non-distended Ext: no clubbing or cyanosis, trace b/l edema Psych: pleasant and cooperative, but flat Uro: nephrostomy tubes in place with yellow urine Skin: intact over exposed surfaces. Scattered bruises on forearms present  PRIOR EXAMS: Neuro: Alert and oriented x2, right sided strength is 4/5, otherwise strength is 5/5. Sensory exam normal for light touch and pain in all 4 limbs. No limb ataxia or cerebellar signs. No abnormal tone appreciated.  Halting speech, non-fluent that can fluctuate. Fair standing balance  Assessment/Plan: 1. Functional deficits which require 3+ hours per day of interdisciplinary therapy in a comprehensive inpatient rehab setting. Physiatrist is providing close team supervision  and 24 hour management of active medical problems listed below. Physiatrist and rehab team continue to assess barriers to discharge/monitor patient progress toward functional and medical goals  Care Tool:  Bathing    Body parts bathed by patient: Right arm, Left arm, Chest, Abdomen, Right upper leg, Left upper leg, Front perineal area, Face   Body parts bathed by helper: Buttocks, Right lower leg, Left lower leg     Bathing assist Assist Level: Minimal Assistance - Patient > 75%     Upper Body Dressing/Undressing Upper body dressing   What is the patient wearing?: Pull over shirt    Upper body assist Assist Level: Supervision/Verbal cueing    Lower Body Dressing/Undressing Lower body dressing      What is the patient wearing?: Pants, Incontinence brief     Lower body assist Assist for lower body dressing: Minimal Assistance - Patient > 75%     Toileting Toileting    Toileting assist Assist for toileting: Moderate Assistance - Patient 50 - 74%     Transfers Chair/bed transfer  Transfers assist  Chair/bed transfer activity did not occur: Safety/medical concerns  Chair/bed transfer assist level: Supervision/Verbal cueing     Locomotion Ambulation   Ambulation assist      Assist level: Contact Guard/Touching assist Assistive device: Walker-rolling  Max distance: 153   Walk 10 feet activity   Assist     Assist level: Contact Guard/Touching assist Assistive device: Walker-rolling   Walk 50 feet activity   Assist    Assist level: Contact Guard/Touching assist Assistive device: Walker-rolling    Walk 150 feet activity   Assist Walk 150 feet activity did not occur: Safety/medical concerns (fatigue)  Assist level: Contact Guard/Touching assist Assistive device: Walker-rolling    Walk 10 feet on uneven surface  activity   Assist Walk 10 feet on uneven surfaces activity did not occur: Safety/medical concerns (fatigue)          Wheelchair     Assist Is the patient using a wheelchair?: Yes Type of Wheelchair: Manual    Wheelchair assist level: Dependent - Patient 0%      Wheelchair 50 feet with 2 turns activity    Assist        Assist Level: Dependent - Patient 0%   Wheelchair 150 feet activity     Assist      Assist Level: Dependent - Patient 0%   Blood pressure 125/69, pulse 92, temperature 97.7 F (36.5 C), temperature source Oral, resp. rate 18, height 5' 10 (1.778 m), weight 104.9 kg, SpO2 99%.  Medical Problem List and Plan: 1. Functional deficits secondary to left MCA scattered infarcts with left ICA and left MCA occlusion s/p IR with TICI2c followed by L ICA and basilar tip emboli s/p mechanical thrombectomy TIC3, likely due to hypercoagulable state in setting of cancer             -patient may shower, cover incision/nephrostomy tubes             -ELOS/Goals: 12/17/23.  PT/OT sup to min A, SLP supervision            -Continue CIR therapies including PT, OT, and SLP   2.  Antithrombotics: -DVT: hx of multiple BL DVT and PE had IVC filter placed 2014  -US  LE positive for age indeterminate DVTs: RT - PopV and PTV LT - CFV, PFV, FV, PopV, Gastrocs  -Probable hypercoagulable state r/t carcinoma  -Pharmaceutical: previously Eliquis  5mg  BID,  heparin  drip transitioned to   coumadin    -12/04/23 Hgb up to 9.2, so improved greatly; FOBT neg  -12/11/23 INR down to 2.9! Appreciate pharmacy assistance   -adjust warfarin per protocol             -antiplatelet therapy: N/A 3. Pain Management: Tylenol  prn  4. Mood/Behavior/Sleep: LCSW to follow for evaluation and support when available.              -antipsychotic agents: N/A  -melatonin PRN 5. Neuropsych/cognition: This patient may be capable of making decisions on his own behalf. 6. Skin/Wound Care: routine pressure relief measures    7. Fluids/Electrolytes/Nutrition: Monitor I/O  - Per speech past swallow study continue regular diet  w/ Ensure and multivitamin supplements-- reordered from prior admission orders   -encouraging po 8/21- potassium 3.3---supplement.--rechecking Monday   8.  Bladder Cancer s/p nephrostomy tubes: stage IV, metastatic to the bone by MRI of the spine on 11/08/2023.  -Hx of prostate CA, on androgen therapy- leupron q6mos-- follows with [facility] Heme/Onc -prior UTI: Ceftriaxone  1g daily completed 8/10   9. AKI: Monitor BMP w/GFR  Cr 1.22--1.42--1.52>> 1.16 on 8/13.   - 8/18 Cr 1.3 today, ?baseline renal insufficiency?  -encourage fluids -12/10/23 Cr 1.19 yesterday, monitor 10.  Anemia: Hemoglobin stable post PRBC 2 units Hgb 9.9-- downtrended  during readmission, 9.0--8.5--8.5--7.7--7.6 but up to 9.2 on 8/17.  -12/05/23 Hgb 8.3, FOBT neg. 8/22 hgb up to 8.0 today after 1u PRBC yesterday. -anemia is normocytic. Suspect he has pancytopenia d/t bladder ca. Could be some GI loss related to supratherapeutic INR   -avoid supratherapeutic INR's   -recheck stool x 1 (still waiting on another sample)   -continue multivitamin,  b complex and add ferrous sulfate  11. HTN now with hypotension: hx of orthostasis which resolved after IV fluids--now holding home amlodipine  5 mg daily and Carvdilol 12.5 mg - resume as needed. Avoid hypotension -8/22-24  blood pressures reasonable while still holding meds, no changes   Vitals:   12/08/23 0903 12/08/23 0922 12/08/23 1219 12/08/23 1503  BP: 127/78 (!) 126/52 138/82 127/68   12/08/23 1929 12/09/23 0534 12/09/23 1652 12/09/23 2027  BP: 134/66 128/64 133/67 (!) 145/75   12/10/23 0432 12/10/23 1431 12/10/23 1919 12/11/23 0339  BP: (!) 111/55 131/64 139/80 125/69      13. HLD: LDL 112, goal < 70, atorvastatin  80mg  daily   14. Obesity: Educate on diet and weight loss to promote overall health and mobility.  Body mass index is 33.06 kg/m.   15. Hx of laryngeal cancer s/p radiation therapy, voice sounds mildly hoarse  16. GERD: reordered pepcid  20mg  daily PRN since it  appears that's how he was using it; might also warrant Protonix ? Monitor.   17. Elevated LFTs: AST 67/ALT 68, alkphos/bili WNL on 8/9. Related to Ca?  -repeat LFTs  perhaps sl improved. Follow up again 8/25    -consider changing statin   18. Thrombocytopenia/pancytopenia:   -8/22 platelets down to 89k -> 72k -> 66k  -likely d/t metastatic bladder cancer -monitor serially -checking in with oncology to see if they have any recs.    LOS: 8 days A FACE TO FACE EVALUATION WAS PERFORMED  [address] 12/11/2023, 9:59 AM

## 2023-12-11 NOTE — Plan of Care (Signed)
  Problem: Consults Goal: RH STROKE PATIENT EDUCATION Description: See Patient Education module for education specifics  Outcome: Progressing   

## 2023-12-12 LAB — HEPATIC FUNCTION PANEL
ALT: 39 U/L (ref 0–44)
AST: 33 U/L (ref 15–41)
Albumin: 2.5 g/dL — ABNORMAL LOW (ref 3.5–5.0)
Alkaline Phosphatase: 195 U/L — ABNORMAL HIGH (ref 38–126)
Bilirubin, Direct: <0.1 mg/dL (ref 0.0–0.2)
Total Bilirubin: 0.5 mg/dL (ref 0.0–1.2)
Total Protein: 5.7 g/dL — ABNORMAL LOW (ref 6.5–8.1)

## 2023-12-12 LAB — PROTIME-INR
INR: 2 — ABNORMAL HIGH (ref 0.8–1.2)
Prothrombin Time: 23.6 s — ABNORMAL HIGH (ref 11.4–15.2)

## 2023-12-12 LAB — CBC
HCT: 24.8 % — ABNORMAL LOW (ref 39.0–52.0)
Hemoglobin: 7.9 g/dL — ABNORMAL LOW (ref 13.0–17.0)
MCH: 30.4 pg (ref 26.0–34.0)
MCHC: 31.9 g/dL (ref 30.0–36.0)
MCV: 95.4 fL (ref 80.0–100.0)
Platelets: 71 K/uL — ABNORMAL LOW (ref 150–400)
RBC: 2.6 MIL/uL — ABNORMAL LOW (ref 4.22–5.81)
RDW: 20.3 % — ABNORMAL HIGH (ref 11.5–15.5)
WBC: 6 K/uL (ref 4.0–10.5)
nRBC: 0 % (ref 0.0–0.2)

## 2023-12-12 LAB — BASIC METABOLIC PANEL WITH GFR
Anion gap: 5 (ref 5–15)
BUN: 18 mg/dL (ref 8–23)
CO2: 23 mmol/L (ref 22–32)
Calcium: 8.4 mg/dL — ABNORMAL LOW (ref 8.9–10.3)
Chloride: 111 mmol/L (ref 98–111)
Creatinine, Ser: 1.11 mg/dL (ref 0.61–1.24)
GFR, Estimated: >60 mL/min (ref >60)
Glucose, Bld: 91 mg/dL (ref 70–99)
Potassium: 3.7 mmol/L (ref 3.5–5.1)
Sodium: 139 mmol/L (ref 135–145)

## 2023-12-12 MED ORDER — WARFARIN SODIUM 2.5 MG PO TABS
2.5000 mg | ORAL_TABLET | Freq: Once | ORAL | Status: AC
  Administered 2023-12-12: 2.5 mg via ORAL
  Filled 2023-12-12: qty 1

## 2023-12-12 NOTE — Progress Notes (Signed)
 PROGRESS NOTE   Subjective/Complaints:  Pt up in chair.had a good weekend. No issues.  Energy levels are good. Denies pain  ROS: Patient denies fever, rash, sore throat, blurred vision, dizziness, nausea, vomiting, diarrhea, cough, shortness of breath or chest pain, joint or back/neck pain, headache, or mood change.   Objective:   No results found. Recent Labs    12/12/23 0516  WBC 6.0  HGB 7.9*  HCT 24.8*  PLT 71*   Recent Labs    12/12/23 0516  NA 139  K 3.7  CL 111  CO2 23  GLUCOSE 91  BUN 18  CREATININE 1.11  CALCIUM  8.4*         Intake/Output Summary (Last 24 hours) at 12/12/2023 1037 Last data filed at 12/12/2023 0716 Gross per 24 hour  Intake 827 ml  Output 1325 ml  Net -498 ml        Physical Exam: Vital Signs Blood pressure 128/70, pulse 94, temperature 98.1 F (36.7 C), resp. rate 18, height 5' 10 (1.778 m), weight 104.9 kg, SpO2 98%.  Constitutional: No distress . Vital signs reviewed. HEENT: NCAT, EOMI, oral membranes moist Neck: supple Cardiovascular: RRR without murmur. No JVD    Respiratory/Chest: CTA Bilaterally without wheezes or rales. Normal effort    GI/Abdomen: BS +, non-tender, non-distended Ext: no clubbing, cyanosis, or edema Psych: pleasant and cooperative  Uro: nephrostomy tubes in place with yellow urine Skin: intact over exposed surfaces. Scattered bruises on forearms present  PRIOR EXAMS: Neuro: Alert and oriented x2, right sided strength is 4/5, otherwise strength is 5/5. Sensory exam normal for light touch and pain in all 4 limbs. No limb ataxia or cerebellar signs. No abnormal tone appreciated.  Still with non-fluent speech, but able to convey thoughts with extra time, when he slows down  Assessment/Plan: 1. Functional deficits which require 3+ hours per day of interdisciplinary therapy in a comprehensive inpatient rehab setting. Physiatrist is providing close  team supervision and 24 hour management of active medical problems listed below. Physiatrist and rehab team continue to assess barriers to discharge/monitor patient progress toward functional and medical goals  Care Tool:  Bathing    Body parts bathed by patient: Right arm, Left arm, Chest, Abdomen, Right upper leg, Left upper leg, Front perineal area, Face   Body parts bathed by helper: Buttocks, Right lower leg, Left lower leg     Bathing assist Assist Level: Minimal Assistance - Patient > 75%     Upper Body Dressing/Undressing Upper body dressing   What is the patient wearing?: Pull over shirt    Upper body assist Assist Level: Supervision/Verbal cueing    Lower Body Dressing/Undressing Lower body dressing      What is the patient wearing?: Pants, Incontinence brief     Lower body assist Assist for lower body dressing: Minimal Assistance - Patient > 75%     Toileting Toileting    Toileting assist Assist for toileting: Moderate Assistance - Patient 50 - 74%     Transfers Chair/bed transfer  Transfers assist  Chair/bed transfer activity did not occur: Safety/medical concerns  Chair/bed transfer assist level: Supervision/Verbal cueing     Locomotion Ambulation  Ambulation assist      Assist level: Contact Guard/Touching assist Assistive device: Walker-rolling Max distance: 153   Walk 10 feet activity   Assist     Assist level: Contact Guard/Touching assist Assistive device: Walker-rolling   Walk 50 feet activity   Assist    Assist level: Contact Guard/Touching assist Assistive device: Walker-rolling    Walk 150 feet activity   Assist Walk 150 feet activity did not occur: Safety/medical concerns (fatigue)  Assist level: Contact Guard/Touching assist Assistive device: Walker-rolling    Walk 10 feet on uneven surface  activity   Assist Walk 10 feet on uneven surfaces activity did not occur: Safety/medical concerns (fatigue)          Wheelchair     Assist Is the patient using a wheelchair?: Yes Type of Wheelchair: Manual    Wheelchair assist level: Dependent - Patient 0%      Wheelchair 50 feet with 2 turns activity    Assist        Assist Level: Dependent - Patient 0%   Wheelchair 150 feet activity     Assist      Assist Level: Dependent - Patient 0%   Blood pressure 128/70, pulse 94, temperature 98.1 F (36.7 C), resp. rate 18, height 5' 10 (1.778 m), weight 104.9 kg, SpO2 98%.  Medical Problem List and Plan: 1. Functional deficits secondary to left MCA scattered infarcts with left ICA and left MCA occlusion s/p IR with TICI2c followed by L ICA and basilar tip emboli s/p mechanical thrombectomy TIC3, likely due to hypercoagulable state in setting of cancer             -patient may shower, cover incision/nephrostomy tubes             -ELOS/Goals: 12/17/23.  PT/OT sup to min A, SLP supervision              -Continue CIR therapies including PT, OT, and SLP    2.  Antithrombotics: -DVT: hx of multiple BL DVT and PE had IVC filter placed 2014  -US  LE positive for age indeterminate DVTs: RT - PopV and PTV LT - CFV, PFV, FV, PopV, Gastrocs  -Probable hypercoagulable state r/t carcinoma  -Pharmaceutical: previously Eliquis  5mg  BID,  heparin  drip transitioned to   coumadin    -appreciate pharmacy monitoring of INR             -antiplatelet therapy: N/A 3. Pain Management: Tylenol  prn  4. Mood/Behavior/Sleep: LCSW to follow for evaluation and support when available.              -antipsychotic agents: N/A  -melatonin PRN 5. Neuropsych/cognition: This patient may be capable of making decisions on his own behalf. 6. Skin/Wound Care: routine pressure relief measures    7. Fluids/Electrolytes/Nutrition: Monitor I/O  - Per speech past swallow study continue regular diet w/ Ensure and multivitamin supplements-- reordered from prior admission orders   -encouraging po 8/25 K+ 3.7, continue daily  supp 8.  Bladder Cancer s/p nephrostomy tubes: stage IV, metastatic to the bone by MRI of the spine on 11/08/2023.  -Hx of prostate CA, on androgen therapy- leupron q6mos-- follows with [facility] Heme/Onc -prior UTI: Ceftriaxone  1g daily completed 8/10   9. AKI: Monitor BMP w/GFR  Cr 1.22--1.42--1.52>> 1.16 on 8/13.   - 8/18 Cr 1.3 today, ?baseline renal insufficiency?  -encourage fluids -12/10/23 Cr 1.19 yesterday, monitor 10.  Anemia: Hemoglobin stable post PRBC 2 units Hgb 9.9-- downtrended during readmission, 9.0--8.5--8.5--7.7--7.6 but up  to 9.2 on 8/17.  -12/05/23 Hgb 8.3, FOBT neg. 8/25 hgb essentially holding at 7.9 today  -received 1u prbc on rehab so far -anemia is normocytic. Suspect he has pancytopenia d/t bladder ca. Could be some GI loss related to previously supratherapeutic INR   -avoid supratherapeutic INR's   -recheck stool x 1 (have not received a second sample)   -continue multivitamin,  b complex and add ferrous sulfate  11. HTN now with hypotension: hx of orthostasis which resolved after IV fluids--now holding home amlodipine  5 mg daily and Carvdilol 12.5 mg - resume as needed. Avoid hypotension -8/25  blood pressures reasonable while still holding meds, no changes   Vitals:   12/08/23 1503 12/08/23 1929 12/09/23 0534 12/09/23 1652  BP: 127/68 134/66 128/64 133/67   12/09/23 2027 12/10/23 0432 12/10/23 1431 12/10/23 1919  BP: (!) 145/75 (!) 111/55 131/64 139/80   12/11/23 0339 12/11/23 1344 12/11/23 1956 12/12/23 0515  BP: 125/69 (!) 140/72 125/78 128/70      13. HLD: LDL 112, goal < 70, atorvastatin  80mg  daily   14. Obesity: Educate on diet and weight loss to promote overall health and mobility.  Body mass index is 33.06 kg/m.   15. Hx of laryngeal cancer s/p radiation therapy, voice sounds mildly hoarse  16. GERD: reordered pepcid  20mg  daily PRN since it appears that's how he was using it; might also warrant Protonix ? Monitor.   17. Elevated LFTs: AST 67/ALT 68,  alkphos/bili WNL on 8/9. Related to Ca?  -repeat LFTs  perhaps sl improved. Follow up again 8/25    -consider changing statin    -8/25 need to add LFT's on to this morning's labs 18. Thrombocytopenia/pancytopenia:   -8/25 platelets   89k -> 72k -> 66k ->71k  -likely d/t metastatic bladder cancer -monitor serially -NP spoke with oncology last week. No new recs made   LOS: 9 days A FACE TO FACE EVALUATION WAS PERFORMED  Arthea ONEIDA Gunther 12/12/2023, 10:37 AM

## 2023-12-12 NOTE — Progress Notes (Signed)
 Speech Language Pathology Weekly Progress and Session Note  Patient Details  Name: Allen Macdonald MRN: [id number] Date of Birth: July 24, 1950  Beginning of progress report period: December 04, 2023 End of progress report period: December 12, 2023  Today's Date: 12/12/2023 SLP Individual Time: 8598-8540 SLP Individual Time Calculation (min): 58 min  Short Term Goals: Week 1: SLP Short Term Goal 1 (Week 1): Pt will complete mildly complex naming tasks w/ minA SLP Short Term Goal 1 - Progress (Week 1): Met SLP Short Term Goal 2 (Week 1): Pt will follow multi step commands with min A verbal and visual cues. SLP Short Term Goal 2 - Progress (Week 1): Met SLP Short Term Goal 3 (Week 1): Pt will complete functional wirtten expression tasks with min A. SLP Short Term Goal 3 - Progress (Week 1): Not met SLP Short Term Goal 4 (Week 1): Pt will formulate sentences using word finding strategies to communticate his wants and needs with min a. SLP Short Term Goal 4 - Progress (Week 1): Met SLP Short Term Goal 5 (Week 1): Pt will read and comprehend functional written information with min A. SLP Short Term Goal 5 - Progress (Week 1): Met    New Short Term Goals: Week 2: SLP Short Term Goal 1 (Week 2): STG = LTG due to ELOS  Weekly Progress Updates: Pt has made good gains and has met 4 of 5 STGs this reporting period due to improved expressive and receptive communication. Currently, patient continues to require min A for communicating wants/needs and following multi-step commands. Patient did not meet writing goal due to continued need for written model for correct spelling. Pt/family eduction ongoing. Pt would benefit from continued ST intervention to maximize communication and cognition in order to maximize functional independence at d/c.    Intensity: Minumum of 1-2 x/day, 30 to 90 minutes Frequency: 3 to 5 out of 7 days Duration/Length of Stay: 8/30 Treatment/Interventions: Multimodal communication  approach;Speech/Language facilitation;Cueing hierarchy;Functional tasks;Therapeutic Activities;Patient/family education;Internal/external aids   Daily Session  Skilled Therapeutic Interventions:  Skilled therapy session focused on communication goals. SLP facilitated session by prompting patient to read sentences aloud and then re-read sentence filling in bolded word with the appropriate synonym/antonym. Patient completed synonym task with 80% accuracy given minA and antonym activity with 78% accuracy given minA. Patient benefited most from phonemic and semantic cues. Patient with increased apraxic errors as frustration increased, therefore patient benefited from cues to stop and breathe. Patient left in chair with alarm set and call bell in reach. Continue POC.      Pain None reported   Therapy/Group: Individual Therapy  Zurii Hewes M.A., CCC-SLP 12/12/2023, 3:01 PM

## 2023-12-12 NOTE — Progress Notes (Signed)
 PHARMACY - ANTICOAGULATION CONSULT NOTE  Pharmacy Consult:  Heparin  to Warfarin Indication: History of VTE with recurrent CVAs  Allergies  Allergen Reactions   Bee Venom Anaphylaxis    Patient Measurements: Height: 5' 10 (177.8 cm) Weight: 104.9 kg (231 lb 4.2 oz) IBW/kg (Calculated) : 73  Vital Signs: Temp: 98.1 F (36.7 C) (08/25 0515) BP: 128/70 (08/25 0515) Pulse Rate: 94 (08/25 0515)  Labs: Recent Labs    12/10/23 0413 12/11/23 0503 12/12/23 0516  HGB  --   --  7.9*  HCT  --   --  24.8*  PLT  --   --  71*  LABPROT 36.7* 31.5* 23.6*  INR 3.5* 2.9* 2.0*  CREATININE  --   --  1.11    Estimated Creatinine Clearance: 69.8 mL/min (by C-G formula based on SCr of 1.11 mg/dL).  Assessment: 49 YOM with 2 recent L MCA strokes s/p MT each time.  Patient went to Rehab and was receiving Eliquis , last dose on 8/12 at 0847.  Patient returned to the inpatient side with recurrent CVA on 11/29/23.  MRI shows petechial hemorrhage with new/increased scattered punctate infarcts and new microhemorrhages.  Pharmacy consulted to dose IV heparin  bridge to warfarin. Stopped Eliquis .   8/24: INR 2.0 (down from 2.9 following held dose 8/23). CBC stable.    Goal of Therapy:  INR 2 to 3 Monitor platelets by anticoagulation protocol: Yes  Plan:  Warfarin 2.5 mg PO x1  Check INR daily while on warfarin CBC Mon/Thurs  Continue to monitor H&H and platelets  Massie Fila, PharmD Clinical Pharmacist  12/12/2023 7:59 AM

## 2023-12-12 NOTE — Progress Notes (Signed)
 Occupational Therapy Weekly Progress Note  Patient Details  Name: Allen Macdonald MRN: [id number] Date of Birth: 05-08-1950  Beginning of progress report period: December 04, 2023 End of progress report period: December 12, 2023  Today's Date: 12/12/2023    Patient has met 4 of 4 short term goals.  Pt making excellent progress this reporting period with pt completing UB ADLs with supervision and LB ADLs with CGA for balance but progressing to supervision. Pt continues to present with decreased activity tolerance and RUE weakness. POC remains appropriate with DC set for 8/30.   Patient continues to demonstrate the following deficits: decreased cardiorespiratoy endurance, decreased coordination, and decreased standing balance and hemiplegia and therefore will continue to benefit from skilled OT intervention to enhance overall performance with BADL.  Patient progressing toward long term goals..  Continue plan of care.  OT Short Term Goals Week 1:  OT Short Term Goal 1 (Week 1): Pt will complete UB dressing with verbal cues only. OT Short Term Goal 1 - Progress (Week 1): Met OT Short Term Goal 2 (Week 1): Pt will complete toileting with MIN A using LRAD OT Short Term Goal 2 - Progress (Week 1): Met OT Short Term Goal 3 (Week 1): Pt will complete toilet transfers with CGA using LRAD OT Short Term Goal 3 - Progress (Week 1): Met OT Short Term Goal 4 (Week 1): Pt will complete LB dressing CGA using AE and LRAD PRN OT Short Term Goal 4 - Progress (Week 1): Met Week 2:  OT Short Term Goal 1 (Week 2): STG= LTGs   Therapy Documentation Precautions:  Precautions Precautions: Fall Recall of Precautions/Restrictions: Intact Precaution/Restrictions Comments: bilat nephrostomy bags, fall, aphasia, mild R hemi Restrictions Weight Bearing Restrictions Per Provider Order: No    Therapy/Group: Individual Therapy  Ronal Gift [facility] 12/12/2023, 8:18 AM

## 2023-12-12 NOTE — Progress Notes (Signed)
 Occupational Therapy Session Note  Patient Details  Name: Allen Macdonald MRN: [id number] Date of Birth: 1950/10/14  Today's Date: 12/12/2023 OT Individual Time: 8881-8841 OT Individual Time Calculation (min): 40 min    Short Term Goals: Week 2:  OT Short Term Goal 1 (Week 2): STG= LTGs  Skilled Therapeutic Interventions/Progress Updates:   Pt greeted seated in recliner, pt agreeable to OT intervention.      Transfers/bed mobility/functional mobility:  Pt completed all sit>stands with RW and supervision. Pt completed all functional ambulation with RW and supervision.  Therapeutic activity:  Pt completed various therapeutic activities at [facility] to challenge RUE muscular endurance, standing balance and to facilitate improved word finding. Pt instructed to sequence A-Z letters on screen in standing with 4lb weight donned on RUE. Pt completed this task with 59% accuracy in 5 mins and 49 secs with a 13.95 sec reaction time.   Pt completed same task but with numbers with pt doing much better sequencing through this task- 64% accurate in 1 min with a 4 sec reaction time.   Pt completed standing trail making task in 1 min and 48 seconds with 3 errors and 2 interruptions.   Pt completed simulated IADL task with pt instructed to transport weights ( 3-5 lbs) from counter top to [facility] shelf to simulate putting groceries away in the kitchen. Pt completed task with + effort and time with supervision.   Pt completed simulated IADL task with pt instructed to retrieve cups from [facility] cabinet and then pour water from pitcher into cups and then put cups back to [facility] cabinet. Pt completed task with + time and supervision.   Ended session with pt seated in recliner with all needs within reach and chair alarm activated.                    Therapy Documentation Precautions:  Precautions Precautions: Fall Recall of Precautions/Restrictions: Intact Precaution/Restrictions Comments: bilat nephrostomy bags, fall,  aphasia, mild R hemi Restrictions Weight Bearing Restrictions Per Provider Order: No  Pain: No pain    Therapy/Group: Individual Therapy  [name] 12/12/2023, 12:27 PM

## 2023-12-12 NOTE — Progress Notes (Signed)
 Speech Language Pathology Daily Session Note  Patient Details  Name: Allen Macdonald MRN: [id number] Date of Birth: [birth year]  Today's Date: 12/12/2023 SLP Individual Time: 9048-8964 SLP Individual Time Calculation (min): 44 min  Short Term Goals: Week 1: SLP Short Term Goal 1 (Week 1): Pt will complete mildly complex naming tasks w/ minA SLP Short Term Goal 2 (Week 1): Pt will follow multi step commands with min A verbal and visual cues. SLP Short Term Goal 3 (Week 1): Pt will complete functional wirtten expression tasks with min A. SLP Short Term Goal 4 (Week 1): Pt will formulate sentences using word finding strategies to communticate his wants and needs with min a. SLP Short Term Goal 5 (Week 1): Pt will read and comprehend functional written information with min A.  Skilled Therapeutic Interventions:   Pt greeted in his room. He was awake in his recliner and very agreeable to tx tasks targeting communication goals. During initial conversation re events of the weekend, he required only minA to express mildly complex thoughts/ideas. He also completed a sentence formulation task comparing/contrasting items. Only minA was required for sentence formulation itself, though modA cues were required for mildly specific word finding. Phonemic and semantic paraphasias noted throughout. Appeared to present w/ reduced apraxia as compared to prev tx sessions. He was able to independently write his biographical info w/ 98% accuracy, demonstrating only trace spelling errors. He was also able to complete a task ID words w/ missing letters when provided w/ the category and required only supervisionA. Supervision cues were also required for reading comprehension and attention to details during written directions task w/ 2 step directions. At the end of tx tasks, he was left in his recliner w/ the call light within reach. Recommend cont ST per POC.   Pain Pain Assessment Pain Scale: 0-10 Pain Score: 0-No  pain  Therapy/Group: Individual Therapy  [name] 12/12/2023, 10:28 AM

## 2023-12-12 NOTE — Plan of Care (Signed)
  Problem: RH BOWEL ELIMINATION Goal: RH STG MANAGE BOWEL WITH ASSISTANCE Description: STG Manage Bowel with mod I Assistance. Outcome: Progressing   Problem: RH BLADDER ELIMINATION Goal: RH STG MANAGE BLADDER WITH ASSISTANCE Description: STG Manage Bladder With min Assistance Outcome: Progressing   Problem: RH PAIN MANAGEMENT Goal: RH STG PAIN MANAGED AT OR BELOW PT'S PAIN GOAL Description: < 4 with prns Outcome: Progressing

## 2023-12-12 NOTE — Progress Notes (Signed)
 Physical Therapy Session Note  Patient Details  Name: Allen Macdonald MRN: [id number] Date of Birth: May 15, 1950  Today's Date: 12/12/2023 PT Individual Time: 0803-0845 PT Individual Time Calculation (min): 42 min   Short Term Goals: Week 1:  PT Short Term Goal 1 (Week 1): Pt will complete transfers with CGA consistently PT Short Term Goal 2 (Week 1): Pt will complete up/down 4 steps with CGA and BHRs PT Short Term Goal 3 (Week 1): Pt will ambulate 150' with RW CGA  Skilled Therapeutic Interventions/Progress Updates:  Patient seated upright on entrance to room. Patient alert and agreeable to PT session.   Patient with no pain complaint at start of session.  Therapeutic Activity: Bed Mobility: Pt performed supine <> sit with ***. VC/ tc required for ***. Transfers: Pt performed sit<>stand and stand pivot transfers throughout session with ***. Provided vc/ tc for***.  Gait Training:  Pt ambulated *** ft using *** with ***. Demonstrated ***. Provided vc/ tc for ***.  Neuromuscular Re-ed: NMR facilitated during session with focus on standing balance, motor control. Pt guided in ***. NMR performed for improvements in motor control and coordination, balance, sequencing, judgement, and self confidence/ efficacy in performing all aspects of mobility at highest level of independence.   Patient *** at end of session with brakes locked, *** alarm set, and all needs within reach.   Therapy Documentation Precautions:  Precautions Precautions: Fall Recall of Precautions/Restrictions: Intact Precaution/Restrictions Comments: bilat nephrostomy bags, fall, aphasia, mild R hemi Restrictions Weight Bearing Restrictions Per Provider Order: No  Pain:  Brief moment of pain when pressure/ tension on      Therapy/Group: Individual Therapy  Mliss DELENA Milliner PT, DPT, CSRS 12/12/2023, 12:34 PM

## 2023-12-13 LAB — PROTIME-INR
INR: 1.9 — ABNORMAL HIGH (ref 0.8–1.2)
Prothrombin Time: 23.2 s — ABNORMAL HIGH (ref 11.4–15.2)

## 2023-12-13 MED ORDER — WARFARIN SODIUM 3 MG PO TABS
3.0000 mg | ORAL_TABLET | Freq: Once | ORAL | Status: AC
  Administered 2023-12-13: 3 mg via ORAL
  Filled 2023-12-13 (×2): qty 1

## 2023-12-13 NOTE — Progress Notes (Signed)
 PROGRESS NOTE   Subjective/Complaints:  Overall feeling well. No new problems this morning. Energy levels are fair  ROS: Patient denies fever, rash, sore throat, blurred vision, dizziness, nausea, vomiting, diarrhea, cough, shortness of breath or chest pain, joint or back/neck pain, headache, or mood change.   Objective:   No results found. Recent Labs    12/12/23 0516  WBC 6.0  HGB 7.9*  HCT 24.8*  PLT 71*   Recent Labs    12/12/23 0516  NA 139  K 3.7  CL 111  CO2 23  GLUCOSE 91  BUN 18  CREATININE 1.11  CALCIUM  8.4*         Intake/Output Summary (Last 24 hours) at 12/13/2023 1031 Last data filed at 12/13/2023 0716 Gross per 24 hour  Intake 1189 ml  Output 1400 ml  Net -211 ml        Physical Exam: Vital Signs Blood pressure 133/63, pulse 91, temperature 98 F (36.7 C), temperature source Oral, resp. rate 18, height 5' 10 (1.778 m), weight 104.9 kg, SpO2 98%.  Constitutional: No distress . Vital signs reviewed. HEENT: NCAT, EOMI, oral membranes moist Neck: supple Cardiovascular: RRR without murmur. No JVD    Respiratory/Chest: CTA Bilaterally without wheezes or rales. Normal effort    GI/Abdomen: BS +, non-tender, non-distended Ext: no clubbing, cyanosis. Has L>R LE pitting edema Psych: pleasant and cooperative  Uro: nephrostomy tubes in place with yellow urine Skin: intact over exposed surfaces. Scattered bruises on forearms present Neuro: Alert and oriented x2, right sided strength is 4/5, otherwise strength is 5/5. Sensory exam normal for light touch and pain in all 4 limbs. No limb ataxia or cerebellar signs. No abnormal tone appreciated.  Still with non-fluent speech, takes his time  Assessment/Plan: 1. Functional deficits which require 3+ hours per day of interdisciplinary therapy in a comprehensive inpatient rehab setting. Physiatrist is providing close team supervision and 24 hour  management of active medical problems listed below. Physiatrist and rehab team continue to assess barriers to discharge/monitor patient progress toward functional and medical goals  Care Tool:  Bathing    Body parts bathed by patient: Right arm, Left arm, Chest, Abdomen, Right upper leg, Left upper leg, Front perineal area, Face   Body parts bathed by helper: Buttocks, Right lower leg, Left lower leg     Bathing assist Assist Level: Minimal Assistance - Patient > 75%     Upper Body Dressing/Undressing Upper body dressing   What is the patient wearing?: Pull over shirt    Upper body assist Assist Level: Supervision/Verbal cueing    Lower Body Dressing/Undressing Lower body dressing      What is the patient wearing?: Pants, Incontinence brief     Lower body assist Assist for lower body dressing: Minimal Assistance - Patient > 75%     Toileting Toileting    Toileting assist Assist for toileting: Moderate Assistance - Patient 50 - 74%     Transfers Chair/bed transfer  Transfers assist  Chair/bed transfer activity did not occur: Safety/medical concerns  Chair/bed transfer assist level: Supervision/Verbal cueing     Locomotion Ambulation   Ambulation assist      Assist level:  Contact Guard/Touching assist Assistive device: Walker-rolling Max distance: 153   Walk 10 feet activity   Assist     Assist level: Contact Guard/Touching assist Assistive device: Walker-rolling   Walk 50 feet activity   Assist    Assist level: Contact Guard/Touching assist Assistive device: Walker-rolling    Walk 150 feet activity   Assist Walk 150 feet activity did not occur: Safety/medical concerns (fatigue)  Assist level: Contact Guard/Touching assist Assistive device: Walker-rolling    Walk 10 feet on uneven surface  activity   Assist Walk 10 feet on uneven surfaces activity did not occur: Safety/medical concerns (fatigue)          Wheelchair     Assist Is the patient using a wheelchair?: Yes Type of Wheelchair: Manual    Wheelchair assist level: Dependent - Patient 0%      Wheelchair 50 feet with 2 turns activity    Assist        Assist Level: Dependent - Patient 0%   Wheelchair 150 feet activity     Assist      Assist Level: Dependent - Patient 0%   Blood pressure 133/63, pulse 91, temperature 98 F (36.7 C), temperature source Oral, resp. rate 18, height 5' 10 (1.778 m), weight 104.9 kg, SpO2 98%.  Medical Problem List and Plan: 1. Functional deficits secondary to left MCA scattered infarcts with left ICA and left MCA occlusion s/p IR with TICI2c followed by L ICA and basilar tip emboli s/p mechanical thrombectomy TIC3, likely due to hypercoagulable state in setting of cancer             -patient may shower, cover incision/nephrostomy tubes             -ELOS/Goals: 12/17/23.  PT/OT sup to min A, SLP supervision              -Continue CIR therapies including PT, OT, and SLP     2.  Antithrombotics: -DVT: hx of multiple BL DVT and PE had IVC filter placed 2014  -US  LE positive for age indeterminate DVTs: RT - PopV and PTV LT - CFV, PFV, FV, PopV, Gastrocs  -Probable hypercoagulable state r/t carcinoma  -Pharmaceutical: previously Eliquis  5mg  BID,  heparin  drip transitioned to   coumadin    -appreciate pharmacy monitoring of INR             -antiplatelet therapy: N/A 3. Pain Management: Tylenol  prn  4. Mood/Behavior/Sleep: LCSW to follow for evaluation and support when available.              -antipsychotic agents: N/A  -melatonin PRN 5. Neuropsych/cognition: This patient may be capable of making decisions on his own behalf. 6. Skin/Wound Care: routine pressure relief measures    7. Fluids/Electrolytes/Nutrition: Monitor I/O  - Per speech past swallow study continue regular diet w/ Ensure and multivitamin supplements-- reordered from prior admission orders   -encouraging po 8/25 K+  3.7, continue daily supp for now 8.  Bladder Cancer s/p nephrostomy tubes: stage IV, metastatic to the bone by MRI of the spine on 11/08/2023.  -Hx of prostate CA, on androgen therapy- leupron q6mos-- follows with [facility] -prior UTI: Ceftriaxone  1g daily completed 8/10   9. AKI: Monitor BMP w/GFR  Cr 1.22--1.42--1.52>> 1.16 on 8/13.   - 8/18 Cr 1.3 today, ?baseline renal insufficiency?  -encourage fluids -12/10/23 Cr 1.19 yesterday, monitor 10.  Anemia: Hemoglobin stable post PRBC 2 units Hgb 9.9-- downtrended during readmission, 9.0--8.5--8.5--7.7--7.6 but up to 9.2 on  8/17.  -12/05/23 Hgb 8.3, FOBT neg. 8/25 hgb essentially holding at 7.9 today  -received 1u prbc on rehab so far -anemia is normocytic. Suspect he has pancytopenia d/t bladder ca. Could be some GI loss related to previously supratherapeutic INR   -avoid supratherapeutic INR's   -recheck stool x 1 (have not received a second sample)   -continue multivitamin,  b complex and add ferrous sulfate   8/26 changes, recheck labs Thursday 11. HTN now with hypotension: hx of orthostasis which resolved after IV fluids--now holding home amlodipine  5 mg daily and Carvdilol 12.5 mg - resume as needed. Avoid hypotension -8/25-26  blood pressures reasonable while still holding meds, no changes   Vitals:   12/09/23 1652 12/09/23 2027 12/10/23 0432 12/10/23 1431  BP: 133/67 (!) 145/75 (!) 111/55 131/64   12/10/23 1919 12/11/23 0339 12/11/23 1344 12/11/23 1956  BP: 139/80 125/69 (!) 140/72 125/78   12/12/23 0515 12/12/23 1336 12/12/23 1956 12/13/23 0401  BP: 128/70 122/65 (!) 142/77 133/63      13. HLD: LDL 112, goal < 70, atorvastatin  80mg  daily   14. Obesity: Educate on diet and weight loss to promote overall health and mobility.  Body mass index is 33.06 kg/m.   15. Hx of laryngeal cancer s/p radiation therapy, voice sounds mildly hoarse  16. GERD: reordered pepcid  20mg  daily PRN since it appears that's how he was using it;  might also warrant Protonix ? Monitor.   17. Elevated LFTs: AST 67/ALT 68, alkphos/bili WNL on 8/9. Related to Ca?  -repeat LFTs  perhaps sl improved. Follow up again 8/25    -consider changing statin    -8/25 need to add LFT's on to this morning's labs 18. Thrombocytopenia/pancytopenia:   -8/25 platelets   89k -> 72k -> 66k ->71k  -likely d/t metastatic bladder cancer -monitor serially -NP spoke with oncology last week. No new recs made  19 LE edema, L>R  -likely related to albumin /nutritional status. Recent EF 50-55% on ECHO   Filed Weights   12/03/23 1555 12/10/23 1555  Weight: 103.2 kg 104.9 kg    -weights fairly stable  -will try elevation and TEDS for now   LOS: 10 days A FACE TO FACE EVALUATION WAS PERFORMED  Allen Macdonald 12/13/2023, 10:31 AM

## 2023-12-13 NOTE — Plan of Care (Signed)
  Problem: RH BOWEL ELIMINATION Goal: RH STG MANAGE BOWEL WITH ASSISTANCE Description: STG Manage Bowel with mod I Assistance. Outcome: Progressing   Problem: RH BLADDER ELIMINATION Goal: RH STG MANAGE BLADDER WITH ASSISTANCE Description: STG Manage Bladder With min Assistance Outcome: Progressing   Problem: RH PAIN MANAGEMENT Goal: RH STG PAIN MANAGED AT OR BELOW PT'S PAIN GOAL Description: < 4 with prns Outcome: Progressing

## 2023-12-13 NOTE — Progress Notes (Signed)
 Occupational Therapy Session Note  Patient Details  Name: Allen Macdonald MRN: [id number] Date of Birth: Nov 19, 1950  Today's Date: 12/13/2023 OT Individual Time: 9054-8964 OT Individual Time Calculation (min): 50 min    Short Term Goals: Week 1:  OT Short Term Goal 1 (Week 1): Pt will complete UB dressing with verbal cues only. OT Short Term Goal 1 - Progress (Week 1): Met OT Short Term Goal 2 (Week 1): Pt will complete toileting with MIN A using LRAD OT Short Term Goal 2 - Progress (Week 1): Met OT Short Term Goal 3 (Week 1): Pt will complete toilet transfers with CGA using LRAD OT Short Term Goal 3 - Progress (Week 1): Met OT Short Term Goal 4 (Week 1): Pt will complete LB dressing CGA using AE and LRAD PRN OT Short Term Goal 4 - Progress (Week 1): Met Week 2:  OT Short Term Goal 1 (Week 2): STG= LTGs  Skilled Therapeutic Interventions/Progress Updates:    Pt received in the room sitting in recliner with his wife present.  Used this opportunity for family education to have pt demonstrate the progress he has made with his self care skills.   Pt demonstrated how he is able to complete sit to stands and standing unsupported with close S.  For an extra measure of safety, pt can use the RW for additional support as he reaches to thighs to don and doff shorts.  Discussed having him always sit to don and doff shorts over feet as pt tends to just want to step out of them. Pt did demonstrate his ability to take shorts off and put them back on without A.  Recommended he have supervision from his wife at home due to low activity tolerance so she can cue him to take rest breaks when she observes he is getting out of breath. Pt is now able to cross legs in a figure four so this enabled him to easily take off non slip socks and start his compression socks. He did need A to complete the compression socks as he was getting fatigued.  A to fully place foot in shoes with shoe horn due to some swelling but  then he was able to tie both shoes demonstrating improved R hand coordination.  Pt and wife brought up pt's frustration with having to have his wife help or supervise as he is accustomed to being very independent. Spent time discussing the changing roles of their relationship but how focusing on positive changes, having gratitude, a healthy optimism and being very patient with himself will help him to continue to recovery.   Overall, pt is making excellent progress and close to meeting his goals.  Pt resting in recliner at end of session with all needs met. Will need to have pt practice emptying his nephrostomy bags in the next OT session.  Therapy Documentation Precautions:  Precautions Precautions: Fall Recall of Precautions/Restrictions: Intact Precaution/Restrictions Comments: bilat nephrostomy bags, fall, aphasia, mild R hemi Restrictions Weight Bearing Restrictions Per Provider Order: No   Pain: Pain Assessment Pain Score: 0-No pain       Therapy/Group: Individual Therapy  Makinzi Prieur 12/13/2023, 12:52 PM

## 2023-12-13 NOTE — Progress Notes (Signed)
 Speech Language Pathology Daily Session Note  Patient Details  Name: Allen Macdonald MRN: [id number] Date of Birth: [birth year]  Today's Date: 12/13/2023 SLP Individual Time: 1330-1415 SLP Individual Time Calculation (min): 45 min  Short Term Goals: Week 2: SLP Short Term Goal 1 (Week 2): STG = LTG due to ELOS  Skilled Therapeutic Interventions:   Pt and his wife greeted at bedside. He was in his recliner upon SLP arrival, very agreeable to tx tasks targeting communication goals. SLP provided pt w/ aphasia wallet card as discussed in previous tx session and provided some additional education to his wife re progress thus far, negative impact of aphasia on IADLs, and functional language tasks to complete in the home environment. She verbalized understanding and additional questions were answered. SLP then challenged pt to a moderately complex responsive naming and description task w/ known category. He benefited from modA cues for sentence formulation during descriptions, though only supervision cues for naming. Increased cueing during description portion of the task was anticipated given introduction of most complex task to date. At the end of tx tasks, he was left in his recliner w/ the call light within reach. Recommend cont ST per POC.   Pain Pain Assessment Pain Score: 0-No pain  Therapy/Group: Individual Therapy  [name] 12/13/2023, 4:26 PM

## 2023-12-13 NOTE — Progress Notes (Signed)
 Physical Therapy Session Note  Patient Details  Name: Allen Macdonald MRN: [id number] Date of Birth: 1951/01/08  Today's Date: 12/13/2023 PT Individual Time: 1105-1201; 1420 - 1502 PT Individual Time Calculation (min): 56 min; 42 min    Short Term Goals: Week 1:  PT Short Term Goal 1 (Week 1): Pt will complete transfers with CGA consistently PT Short Term Goal 1 - Progress (Week 1): Met PT Short Term Goal 2 (Week 1): Pt will complete up/down 4 steps with CGA and BHRs PT Short Term Goal 2 - Progress (Week 1): Met PT Short Term Goal 3 (Week 1): Pt will ambulate 150' with RW CGA PT Short Term Goal 3 - Progress (Week 1): Met  SESSION 1 Skilled Therapeutic Interventions/Progress Updates: Patient sitting in recliner with wife present on entrance to room. Patient alert and agreeable to PT session.   Patient reported no pain or complaints during session.   Therapeutic Activity: Bed Mobility: Pt performed mass practice of sit<>supine from EOB with CGA/minA when R LE leading onto bed (CGA/minA from supine to L sidelying to sit with VC for sequence of hand placement to assist with truncal elevation). Pt performed sit<>supine on hi/low mat for compliant surface with L LE leading with noted improvement in ability to transition to supine with close supervision/CGA, and CGA from supine<sit (multiple reps of this performed). Transfers: Pt performed sit<>stand transfers throughout session with RW and supervision, and without AD with close supervision. Pt continues good recall of hand placement throughout.  - Pt ambulated from room<>main gym in RW in order to continue to improve tolerance to household distances. Pt did so with supervision and no reports of SOB (roughly 100')  Neuromuscular Re-ed: NMR facilitated during session with focus on dynamic standing balance, coordination, dual tasking. - Pt tossing beach ball in main gym without AD and cues to maintain cadence while tossing ball into air. Pt with  CGA for safety and required seated rest break.  - Pt ambulated short distance with tidal tank in B UE's and VC to keep water level throughout. Pt with decrease in cadence with VC to increase. Pt brief needed to be readjusted. Pt statically standing without B UE support while PTA adjusted brief.  NMR performed for improvements in motor control and coordination, balance, sequencing, judgement, and self confidence/ efficacy in performing all aspects of mobility at highest level of independence.   Therapeutic Exercise: Pt performed the following exercises with therapist providing the described cuing and facilitation for improvement. - figure 4 stretch B LE (alternating piriformis and adductor stretch) 2 x 15 sec with PTA manually applying stretch - Hip external rotation + flexion seated B LE's with VC to increase external rotation (target cue to reach PTA hand). Pt required occasional minA to achieve greater ROM. Pt required rest breaks. 2 x till fatigue  Patient sitting in recliner at end of session with brakes locked, wife present, and all needs within reach.   SESSION 2 Skilled Therapeutic Interventions/Progress Updates: Patient sitting in recliner with wife present on entrance to room. Patient alert and agreeable to PT session.   Patient reported no pain during session. PTA discussed with pt and pt wife about upcoming d/c with biggest concern being bed mobility. PTA stated this weeks emphasis on bed mobility interventions as well as transfers in the ADL apartment.   Therapeutic Activity: Bed Mobility: Pt performed sit<supine from EOB with CGA (on R side of bed with L LE leading and improved ability to do so with less  assistance) pt performed supine to slightly R sidelying via HOB railing and cues to push off with UE's. Transfers: Pt performed sit<>stand transfers throughout session with supervision to RW or no AD.  Pt ambulated from room<>main gym (roughly 150') in RW with supervision in order  to continue to improve ambulatory tolerance to navigate household safely.   - TUG = 21.02s in RW with supervision - 5xSTS (avg = 21.81s)   - 22.84s  - 21.17  - 21.41  Neuromuscular Re-ed: NMR facilitated during session with focus on dynamic standing balance, weight shift, dual tasking. - Ambulated 85' x 1 (seated rest to follow) and roughly 160' x 1 (seated rest break required) with tidal tank in B UE's and cues to keep water level. Pt with added cuing to increase B step length to improve cadence. Pt performed with light CGA for safety.   NMR performed for improvements in motor control and coordination, balance, sequencing, judgement, and self confidence/ efficacy in performing all aspects of mobility at highest level of independence.   Patient sitting on toilet at end of session with nsg notified and wife present.       Therapy Documentation Precautions:  Precautions Precautions: Fall Recall of Precautions/Restrictions: Intact Precaution/Restrictions Comments: bilat nephrostomy bags, fall, aphasia, mild R hemi Restrictions Weight Bearing Restrictions Per Provider Order: No  Therapy/Group: Individual Therapy  Zayvion Stailey PTA 12/13/2023, 12:28 PM

## 2023-12-13 NOTE — Progress Notes (Signed)
 PHARMACY - ANTICOAGULATION CONSULT NOTE  Pharmacy Consult:  Heparin  to Warfarin Indication: History of VTE with recurrent CVAs  Allergies  Allergen Reactions   Bee Venom Anaphylaxis    Patient Measurements: Height: 5' 10 (177.8 cm) Weight: 104.9 kg (231 lb 4.2 oz) IBW/kg (Calculated) : 73  Vital Signs: Temp: 98 F (36.7 C) (08/26 0401) Temp Source: Oral (08/26 0401) BP: 133/63 (08/26 0401) Pulse Rate: 91 (08/26 0401)  Labs: Recent Labs    12/11/23 0503 12/12/23 0516 12/13/23 0402  HGB  --  7.9*  --   HCT  --  24.8*  --   PLT  --  71*  --   LABPROT 31.5* 23.6* 23.2*  INR 2.9* 2.0* 1.9*  CREATININE  --  1.11  --     Estimated Creatinine Clearance: 69.8 mL/min (by C-G formula based on SCr of 1.11 mg/dL).  Assessment: 67 YOM with 2 recent L MCA strokes s/p MT each time.  Patient went to Rehab and was receiving Eliquis , last dose on 8/12 at 0847.  Patient returned to the inpatient side with recurrent CVA on 11/29/23.  MRI shows petechial hemorrhage with new/increased scattered punctate infarcts and new microhemorrhages.  Pharmacy consulted to dose IV heparin  bridge to warfarin. Stopped Eliquis .   8/24: INR 1.9- slightly below goal following held dose 8/23. Minimal dwontrend from yesterday suggests INR starting to respond to 8/24 dose. Anticipate INR will be in range tomorrow.    Goal of Therapy:  INR 2 to 3 Monitor platelets by anticoagulation protocol: Yes  Plan:  Warfarin 3 mg PO x1  Check INR daily while on warfarin CBC Mon/Thurs  Continue to monitor H&H and platelets  Massie Fila, PharmD Clinical Pharmacist  12/13/2023 9:09 AM

## 2023-12-13 NOTE — Progress Notes (Signed)
 Physical Therapy Weekly Progress Note  Patient Details  Name: Allen Macdonald MRN: [id number] Date of Birth: Mar 18, 1951  Beginning of progress report period: December 04, 2023 End of progress report period: December 13, 2023  Patient has met 3 of 3 short term goals. Pt is making functional progress towards LTG's by demonstrating STG's met set forth by evaluating PT. Pt ambulates with 150'+ with RW/rollator with overall supervision. Ambulation with no AD introduced this past week with pt doing so with CGA for safety. Pt performs bed mobility with CGA with L LE leading (R side of bed) and use of HOB railing. Overall, pt has significantly improved LOA since evaluation and is overall at the supervisory level with AD. Pt's activity endurance has also improved with adherence to cuing of inhaling through nostrils. Pt has also improved outcome measures of TUG from 34.45s to 21.02s, and 5xSTS from 39.56s to 21.81s.   Patient continues to demonstrate the following deficits {impairments:3041632} and therefore will continue to benefit from skilled PT intervention to increase functional independence with mobility.  Patient {LTG progression:3041653}.  {plan of rjmz:6958345}  PT Short Term Goals Week 1:  PT Short Term Goal 1 (Week 1): Pt will complete transfers with CGA consistently PT Short Term Goal 1 - Progress (Week 1): Met PT Short Term Goal 2 (Week 1): Pt will complete up/down 4 steps with CGA and BHRs PT Short Term Goal 2 - Progress (Week 1): Met PT Short Term Goal 3 (Week 1): Pt will ambulate 150' with RW CGA PT Short Term Goal 3 - Progress (Week 1): Met  Skilled Therapeutic Interventions/Progress Updates:      Therapy Documentation Precautions:  Precautions Precautions: Fall Recall of Precautions/Restrictions: Intact Precaution/Restrictions Comments: bilat nephrostomy bags, fall, aphasia, mild R hemi Restrictions Weight Bearing Restrictions Per Provider Order: No  Dominic Sandoval  PTA   12/13/2023, 7:53 AM

## 2023-12-14 LAB — PROTIME-INR
INR: 1.8 — ABNORMAL HIGH (ref 0.8–1.2)
Prothrombin Time: 21.8 s — ABNORMAL HIGH (ref 11.4–15.2)

## 2023-12-14 MED ORDER — WARFARIN SODIUM 3 MG PO TABS
3.0000 mg | ORAL_TABLET | Freq: Once | ORAL | Status: AC
  Administered 2023-12-14: 3 mg via ORAL
  Filled 2023-12-14: qty 1

## 2023-12-14 NOTE — Progress Notes (Signed)
 PHARMACY - ANTICOAGULATION CONSULT NOTE  Pharmacy Consult:  Heparin  to Warfarin Indication: History of VTE with recurrent CVAs  Allergies  Allergen Reactions   Bee Venom Anaphylaxis    Patient Measurements: Height: 5' 10 (177.8 cm) Weight: 104.9 kg (231 lb 4.2 oz) IBW/kg (Calculated) : 73  Vital Signs: Temp: 98.2 F (36.8 C) (08/27 0402) Temp Source: Oral (08/27 0402) BP: 140/67 (08/27 0402)  Labs: Recent Labs    12/12/23 0516 12/13/23 0402 12/14/23 0433  HGB 7.9*  --   --   HCT 24.8*  --   --   PLT 71*  --   --   LABPROT 23.6* 23.2* 21.8*  INR 2.0* 1.9* 1.8*  CREATININE 1.11  --   --     Estimated Creatinine Clearance: 69.8 mL/min (by C-G formula based on SCr of 1.11 mg/dL).  Assessment: 41 YOM with 2 recent L MCA strokes s/p MT each time.  Patient went to Rehab and was receiving Eliquis , last dose on 8/12 at 0847.  Patient returned to the inpatient side with recurrent CVA on 11/29/23.  MRI shows petechial hemorrhage with new/increased scattered punctate infarcts and new microhemorrhages.  Pharmacy consulted to dose IV heparin  bridge to warfarin. Stopped Eliquis .   8/27: INR 1.8- slightly below goal following held dose 8/23. Minimal dwontrend from yesterday suggests INR starting to respond to 8/24 dose. Anticipate INR will soon respond. Pt consistently eating ~75% of meals.    Goal of Therapy:  INR 2 to 3 Monitor platelets by anticoagulation protocol: Yes  Plan:  Warfarin 3 mg PO x1  Check INR daily while on warfarin CBC Mon/Thurs  Continue to monitor H&H and platelets  Massie Fila, PharmD Clinical Pharmacist  12/14/2023 12:25 PM

## 2023-12-14 NOTE — Progress Notes (Signed)
 Physical Therapy Session Note  Patient Details  Name: Allen Macdonald MRN: [id number] Date of Birth: 09-09-1950  Today's Date: 12/14/2023 PT Individual Time: 1133-1201; 1304 - 1346 PT Individual Time Calculation (min): 28 min; 42 min   Short Term Goals: Week 1:  PT Short Term Goal 1 (Week 1): Pt will complete transfers with CGA consistently PT Short Term Goal 1 - Progress (Week 1): Met PT Short Term Goal 2 (Week 1): Pt will complete up/down 4 steps with CGA and BHRs PT Short Term Goal 2 - Progress (Week 1): Met PT Short Term Goal 3 (Week 1): Pt will ambulate 150' with RW CGA PT Short Term Goal 3 - Progress (Week 1): Met  SESSION 1 Skilled Therapeutic Interventions/Progress Updates: Patient sitting in recliner on entrance to room. Patient alert and agreeable to PT session.   Patient reported no pain during session.  Therapeutic Activity: Bed Mobility: Pt performed supine<>sit on EOB with supervision from sit<supine, and minA (PTA arm used as railing to sit self up as pt and pt wife will order railing for home). VC required for VC to use B LE's to assist with bridge to adjust self to center of bed Transfers: Pt performed sit<>stand transfers throughout session with RW and modI with good adherence to using B UE's to push and anterior rock/lean  - Pt ambulated from room<ADL apartment<ortho gym and to room during session in RW modI with no issue or reports of SOB.  - Pt performed transfer to<>from rocking recliner in ADL apartment with modI and safety cuing for when at home to keep in mind of rocking mechanism when standing to push off. - Pt performed car transfer to elevated height to roughly simulate height of car at d/c with pt only needing assistance to place LE's into car due to not having enough room in simulation (PTA encouraged pt and pt wife to have seat pushed back in car to assist with LE placement).   Patient sitting in recliner at end of session with brakes locked, wife present,  and all needs within reach.  SESSION 2 Skilled Therapeutic Interventions/Progress Updates: Patient sitting in recliner with wife present on entrance to room. Patient alert and agreeable to PT session.   Patient reported no pain. PTA discussed with pt and pt wife about length placement of railing at home with wedge as pt reported sleeping with pillows prior to admission under upper back/shoulders/head (cannot tolerate flat position). Pt ambulated from room<>main gym in RW modI and no reports of SOB. Pt required brief adjustment and stood statically while PTA did so in main gym with one UE support. Pt navigated 4 (6) steps x 3 (rest breaks required) with B UE support as pt reported ability to have stable hand placement on top of refrigerator on L (ascending) and railing on R. PTA encouraged pt and pt wife to keep in mind for future installment of railing on R side to increase pt safety with stair navigation, especially during times of emergency and needing to evacuate household. Pt cued to ensure entire foot place on step by touch base of next step with either heel or front of shoe. Pt wife also educated on pt's supervision LOA with stairs, and to have gait belt donned with hand placement nearby R hip and L shoulder (safety only) to pt's driver that will assist pt to d/c home. Pt neph tubing on R noted to have some bleeding with nsg alerted to assist with bandaging at end of session.  Patient sitting in recliner at end of session with brakes locked, wife present, and all needs within reach.       Therapy Documentation Precautions:  Precautions Precautions: Fall Recall of Precautions/Restrictions: Intact Precaution/Restrictions Comments: bilat nephrostomy bags, fall, aphasia, mild R hemi Restrictions Weight Bearing Restrictions Per Provider Order: No  Therapy/Group: Individual Therapy  Zola Runion PTA 12/14/2023, 3:32 PM

## 2023-12-14 NOTE — Progress Notes (Signed)
 Occupational Therapy Session Note  Patient Details  Name: Allen Macdonald MRN: [id number] Date of Birth: July 21, 1950  Today's Date: 12/14/2023 OT Individual Time: 9169-9084 OT Individual Time Calculation (min): 45 min    Short Term Goals: Week 1:  OT Short Term Goal 1 (Week 1): Pt will complete UB dressing with verbal cues only. OT Short Term Goal 1 - Progress (Week 1): Met OT Short Term Goal 2 (Week 1): Pt will complete toileting with MIN A using LRAD OT Short Term Goal 2 - Progress (Week 1): Met OT Short Term Goal 3 (Week 1): Pt will complete toilet transfers with CGA using LRAD OT Short Term Goal 3 - Progress (Week 1): Met OT Short Term Goal 4 (Week 1): Pt will complete LB dressing CGA using AE and LRAD PRN OT Short Term Goal 4 - Progress (Week 1): Met Week 2:  OT Short Term Goal 1 (Week 2): STG= LTGs  Skilled Therapeutic Interventions/Progress Updates:   Pt received in recliner and agreeable to practicing LB self care skills:  -Practice with using R fingers to open and close nephrology drains.  On right leg he held container with L hand,  on left leg he need some assist to hold container as he had to reach R hand around to open L leg drain.   -Figure 4 stretches in recliner working on gentle back and forth hip flexion with relaxed breathing for increased hip range.  -In figure 4 position pt able to independently doff B non slip socks and donned B shoes and tied them!  -On R leg he was able to get his compression sock on with only 20% assist and on L leg only 40% A.  Excellent effort but does need cues to relax and breathe between each step of LB self care.   Reviewed concepts of energy conservation.    Pt resting in recliner with all needs met.      Therapy Documentation Precautions:  Precautions Precautions: Fall Recall of Precautions/Restrictions: Intact Precaution/Restrictions Comments: bilat nephrostomy bags, fall, aphasia, mild R hemi Restrictions Weight Bearing  Restrictions Per Provider Order: No   Pain: Pain Assessment Pain Score: 0-No pain ADL: ADL Eating: Independent Grooming: Independent Where Assessed-Grooming: Standing at sink Upper Body Bathing: Setup Where Assessed-Upper Body Bathing: Wheelchair Lower Body Bathing: Supervision/safety Where Assessed-Lower Body Bathing: Wheelchair Upper Body Dressing: Independent Where Assessed-Upper Body Dressing: Chair Lower Body Dressing: Minimal assistance (min A with compression socks only) Where Assessed-Lower Body Dressing: Chair Toileting: Contact guard Toilet Transfer: Close supervision Toilet Transfer Method: Ambulating Tub/Shower Transfer: Not assessed Film/video editor: Not assessed ADL Comments: pt sponge bathes at baseline due to ports and drains   Therapy/Group: Individual Therapy  Rishit Burkhalter 12/14/2023, 10:12 AM

## 2023-12-14 NOTE — Patient Care Conference (Signed)
 Inpatient RehabilitationTeam Conference and Plan of Care Update Date: 12/14/2023   Time: 10:04 AM    Patient Name: Allen Macdonald      Medical Record Number: [id number]  Date of Birth: 1950-04-20 Sex: Male         Room/Bed: 4M01C/4M01C-01 Payor Info: Payor: [employer] MEDICARE / Plan: [employer] MEDICARE HMO/PPO / Product Type: *No Product type* /    Admit Date/Time:  12/03/2023  2:45 PM  Primary Diagnosis:  CVA (cerebral vascular accident) [facility])  Hospital Problems: Principal Problem:   CVA (cerebral vascular accident) [facility])    Expected Discharge Date: Expected Discharge Date: 12/17/23  Team Members Present: Physician leading conference: Dr. Murray Collier Social Worker Present: Rhoda Clement, LCSW Nurse Present: Barnie Ronde, RN PT Present: Sherlean Perks, PT;Dominic [place], PTA OT Present: Recardo Maxwell, OT SLP Present: Recardo Mole, SLP     Current Status/Progress Goal Weekly Team Focus  Bowel/Bladder   Continent with bowel with nephrostomy bags. LBM 8/25   Will remain continent with bowel and regain bladder continenece   Provide education on nephrostomy bags and asist with toileting qshift/prn    Swallow/Nutrition/ Hydration               ADL's   making excellent progress,  close to supervision with all tasks except for some A to don compression hose and shoes, he can tie B shoes   supervision goals   balance, activity tolerance, ADL training, pt/fam education    Mobility   Bed mobility = CGA (from R side of bed with L LE leading); Transfers = supervision with/without AD; ambulation = 150+ in RW with supervision; CGA/SBA 6 steps UE supported   supervision  Focus = endurance, dynamic standing balance, stairs, bed mobility, dual tasks, ambulation, family ed    Communication   simple communication minA, modA for more complex info - comprehension improved to supervisionA   supervisionA   pt/family education, tasks of increasing complexity, introduction of tasks to  continue at home    Safety/Cognition/ Behavioral Observations               Pain   Denies pain   Will be free from pain   Assess pain qshift/prn    Skin   Incision to back(nephrosotmy) ddressing in place   Will maintain skin intergrity  Assess skin for s/s of infection      Discharge Planning:  Doing well and progressing trying to get wife in for family education, but needs to get a ride. Awaiting team's recommendations for follow up/DME   Team Discussion: Patient admitted post basal ganglia CVA with thrombectomy. INR therapeutic per MD however anema persisted; requiring transfusion.  MD monitoring  elevated LFTs and treating edema.  Patient on target to meet rehab goals: yes, patient is showing good endurance with resolution of shortness of breath and fatigue.  Needs help with application of dual hand tasking due to fine motor deficits but able to manage emptying nephrostomy tubes with help holding the cylinder/urinal.  Able to ambulate > 150' and navigate steps with SBA.  Needs supervision - min assist to communicate simple needs and mod assist for complex communication due to aphasia and apraxia.    *See Care Plan and progress notes for long and short-term goals.   Revisions to Treatment Plan:  N/a   Teaching Needs: Safety, medications, transfers, toileting, etc.   Current Barriers to Discharge: Decreased caregiver support and Home enviroment access/layout  Possible Resolutions to Barriers: Family education  Medical Summary Current Status: pt wtih pancytopenia, received 1u prbc last week. recent LE swelling. continued nehrostomy tubes  Barriers to Discharge: Medical stability   Possible Resolutions to Barriers/Weekly Focus: supplements to boost hgb, improve nutrition, serial monitoring of CBC,spoke to oncology. elevation, compression LE's   Continued Need for Acute Rehabilitation Level of Care: The patient requires daily medical management by a physician with  specialized training in physical medicine and rehabilitation for the following reasons: Direction of a multidisciplinary physical rehabilitation program to maximize functional independence : Yes Medical management of patient stability for increased activity during participation in an intensive rehabilitation regime.: Yes Analysis of laboratory values and/or radiology reports with any subsequent need for medication adjustment and/or medical intervention. : Yes   I attest that I was present, lead the team conference, and concur with the assessment and plan of the team.   Fredericka Sober B 12/14/2023, 2:10 PM

## 2023-12-14 NOTE — Plan of Care (Signed)
  Problem: RH Memory Goal: LTG Patient will follow step by step directions w/cues (SLP) Description: LTG: Patient will follow step by step directions with cues (SLP). Outcome: Not Applicable Note: D/c goal d/t priority of communication goals   Problem: RH Expression Communication Goal: LTG Patient will verbally express basic/complex needs(SLP) Description: LTG:  Patient will verbally express basic/complex needs, wants or ideas with cues  (SLP) Flowsheets (Taken 12/14/2023 0827) LTG: Patient will verbally express basic/complex needs, wants or ideas (SLP): Minimal Assistance - Patient > 75% Goal: LTG Patient will increase word finding of common (SLP) Description: LTG:  Patient will increase word finding of common objects/daily info/abstract thoughts with cues using compensatory strategies (SLP). Flowsheets (Taken 12/14/2023 0827) LTG: Patient will increase word finding of common (SLP): Supervision

## 2023-12-14 NOTE — Progress Notes (Signed)
 Occupational Therapy Session Note  Patient Details  Name: Allen Macdonald MRN: [id number] Date of Birth: Jan 31, 1951  Today's Date: 12/14/2023 OT Individual Time: 1010-1114 OT Individual Time Calculation (min): 64 min    Short Term Goals: Week 2:  OT Short Term Goal 1 (Week 2): STG= LTGs  Skilled Therapeutic Interventions/Progress Updates:  Pt greeted seated in recliner, pt agreeable to OT intervention.      Transfers/bed mobility/functional mobility: pt completed all sit>stands and functional ambulation with RW and supervision.   ADLs:  LB dressing: donned new brief in standing with total A for time mgmt   Exercises: issued pt level 1 theraputty HEP with pt completing below therex:   HOME EXERCISE PROGRAM  Theraputty Exercises  **Do the following exercises using your affected hand.  1. Roll putty into a ball.   2. Make into a pancake.   3. Roll into a log.   4. Pinch along log with first finger and thumb.   5. Make into a ball.   6. Roll it back into a log.   7. Pinch using thumb and side of first finger.   8. Roll it into a ball, then flatten into a pancake.   9. Using your fingers, make putty into a mountain.   10. Hide several coins or marbles in putty, then pick them out one by one.   Pt completed seated RUE FMC task with pt instructed to screw nuts onto bolts with a focus on intrinsic strength/coordination. Pt completed task with + time but supervision, intermittent cues needed to remember to only use RUE.   Assessments:  The Dynamometer Grip Strength Test is a quantitative and objective measure of isometric muscular strength of the hand and forearm.  -Instructions The patient was asked to sit with their back, pelvis, and knees at 90 degrees. The shoulder was adducted and neutrally rotated with The elbow flexed to 90 degrees and forearm in neutral. The arm was not supported.  -Results The score was determined by calculating the average of 3  trials. The pt's average score was 44 lbs in the R hand and 58 lbs in the L hand.  -Norms Males Average in lbs 55-59 R 101.1 L 83.2 60-64 R 89.7 L 76.8 65-69 R 91.1 L 76.8 70-74 R 75.3 L 64.8 75+ R 65.7 L 55.0  Pinch strength also assessed: LUE- 12 lbs, RUE- 9 lbs       Ended session with pt seated in recliner with all needs within reach and bed alarm activated.                    Therapy Documentation Precautions:  Precautions Precautions: Fall Recall of Precautions/Restrictions: Intact Precaution/Restrictions Comments: bilat nephrostomy bags, fall, aphasia, mild R hemi Restrictions Weight Bearing Restrictions Per Provider Order: No  Pain: No pain reported during session    Therapy/Group: Individual Therapy  Ronal Gift [facility] 12/14/2023, 12:15 PM

## 2023-12-14 NOTE — Progress Notes (Signed)
 Patient ID: Allen Macdonald, male   DOB: 06/01/1950, 73 y.o.   MRN: [id number]  Met with pt to give him the team conference update regarding progressing toward his goals of supervision level and discharge still 8/30. His wife when here has been participating in his therapies. Have set up [facility] and he has all needed equipment. Wife's walker will work for him. Continue to work on discharge needs

## 2023-12-14 NOTE — Plan of Care (Signed)
  Problem: RH BOWEL ELIMINATION Goal: RH STG MANAGE BOWEL WITH ASSISTANCE Description: STG Manage Bowel with mod I Assistance. Outcome: Progressing   Problem: RH BLADDER ELIMINATION Goal: RH STG MANAGE BLADDER WITH ASSISTANCE Description: STG Manage Bladder With min Assistance Outcome: Progressing Goal: RH STG MANAGE BLADDER WITH EQUIPMENT WITH ASSISTANCE Description: STG Manage Bladder With Equipment With min Assistance Outcome: Progressing   Problem: RH PAIN MANAGEMENT Goal: RH STG PAIN MANAGED AT OR BELOW PT'S PAIN GOAL Description: < 4 with prns Outcome: Progressing

## 2023-12-14 NOTE — Progress Notes (Signed)
 PROGRESS NOTE   Subjective/Complaints:  No new complaints. Still some swelling in legs. Excited about getting home soon!  ROS: Patient denies fever, rash, sore throat, blurred vision, dizziness, nausea, vomiting, diarrhea, cough, shortness of breath or chest pain, joint or back/neck pain, headache, or mood change.    Objective:   No results found. Recent Labs    12/12/23 0516  WBC 6.0  HGB 7.9*  HCT 24.8*  PLT 71*   Recent Labs    12/12/23 0516  NA 139  K 3.7  CL 111  CO2 23  GLUCOSE 91  BUN 18  CREATININE 1.11  CALCIUM  8.4*         Intake/Output Summary (Last 24 hours) at 12/14/2023 1758 Last data filed at 12/14/2023 1515 Gross per 24 hour  Intake 476 ml  Output 2100 ml  Net -1624 ml        Physical Exam: Vital Signs Blood pressure 132/60, pulse 94, temperature 97.7 F (36.5 C), temperature source Oral, resp. rate 16, height 5' 10 (1.778 m), weight 104.9 kg, SpO2 100%.  Constitutional: No distress . Vital signs reviewed. HEENT: NCAT, EOMI, oral membranes moist Neck: supple Cardiovascular: RRR without murmur. No JVD    Respiratory/Chest: CTA Bilaterally without wheezes or rales. Normal effort    GI/Abdomen: BS +, non-tender, non-distended Ext: no clubbing, cyanosis, 1+ LE edema L>R Psych: pleasant and cooperative  Uro: nephrostomy tubes in place with yellow urine Skin: intact over exposed surfaces. Scattered bruises on forearms present Neuro: Alert and oriented x2, right sided strength is 4/5, otherwise strength is 5/5. Sensory exam normal for light touch and pain in all 4 limbs. No limb ataxia or cerebellar signs. No abnormal tone appreciated.  Still with non-fluent speech, takes his time. Prior neuro assessment is c/w 12/14/2023 exam.   Assessment/Plan: 1. Functional deficits which require 3+ hours per day of interdisciplinary therapy in a comprehensive inpatient rehab setting. Physiatrist is  providing close team supervision and 24 hour management of active medical problems listed below. Physiatrist and rehab team continue to assess barriers to discharge/monitor patient progress toward functional and medical goals  Care Tool:  Bathing    Body parts bathed by patient: Right arm, Left arm, Chest, Abdomen, Right upper leg, Left upper leg, Front perineal area, Face   Body parts bathed by helper: Buttocks, Right lower leg, Left lower leg     Bathing assist Assist Level: Minimal Assistance - Patient > 75%     Upper Body Dressing/Undressing Upper body dressing   What is the patient wearing?: Pull over shirt    Upper body assist Assist Level: Independent    Lower Body Dressing/Undressing Lower body dressing      What is the patient wearing?: Pants, Incontinence brief     Lower body assist Assist for lower body dressing: Supervision/Verbal cueing     Toileting Toileting    Toileting assist Assist for toileting: Contact Guard/Touching assist     Transfers Chair/bed transfer  Transfers assist  Chair/bed transfer activity did not occur: Safety/medical concerns  Chair/bed transfer assist level: Supervision/Verbal cueing     Locomotion Ambulation   Ambulation assist      Assist level:  Supervision/Verbal cueing Assistive device: Walker-rolling Max distance: 150+   Walk 10 feet activity   Assist     Assist level: Supervision/Verbal cueing Assistive device: Walker-rolling   Walk 50 feet activity   Assist    Assist level: Supervision/Verbal cueing Assistive device: Walker-rolling    Walk 150 feet activity   Assist Walk 150 feet activity did not occur: Safety/medical concerns (fatigue)  Assist level: Supervision/Verbal cueing Assistive device: Walker-rolling    Walk 10 feet on uneven surface  activity   Assist Walk 10 feet on uneven surfaces activity did not occur: Safety/medical concerns (fatigue)   Assist level: Supervision/Verbal  cueing Assistive device: Walker-rolling   Wheelchair     Assist Is the patient using a wheelchair?: Yes Type of Wheelchair: Manual    Wheelchair assist level: Dependent - Patient 0%      Wheelchair 50 feet with 2 turns activity    Assist        Assist Level: Dependent - Patient 0%   Wheelchair 150 feet activity     Assist      Assist Level: Dependent - Patient 0%   Blood pressure 132/60, pulse 94, temperature 97.7 F (36.5 C), temperature source Oral, resp. rate 16, height 5' 10 (1.778 m), weight 104.9 kg, SpO2 100%.  Medical Problem List and Plan: 1. Functional deficits secondary to left MCA scattered infarcts with left ICA and left MCA occlusion s/p IR with TICI2c followed by L ICA and basilar tip emboli s/p mechanical thrombectomy TIC3, likely due to hypercoagulable state in setting of cancer             -patient may shower, cover incision/nephrostomy tubes             -ELOS/Goals: 12/17/23.  PT/OT sup to min A, SLP supervision              -Continue CIR therapies including PT, OT, and SLP. Interdisciplinary team conference today to discuss goals, barriers to discharge, and dc planning.    2.  Antithrombotics: -DVT: hx of multiple BL DVT and PE had IVC filter placed 2014  -US  LE positive for age indeterminate DVTs: RT - PopV and PTV LT - CFV, PFV, FV, PopV, Gastrocs  -Probable hypercoagulable state r/t carcinoma  -Pharmaceutical: previously Eliquis  5mg  BID,  heparin  drip transitioned to   coumadin    -appreciate pharmacy monitoring of INR             -antiplatelet therapy: N/A 3. Pain Management: Tylenol  prn  4. Mood/Behavior/Sleep: LCSW to follow for evaluation and support when available.              -antipsychotic agents: N/A  -melatonin PRN 5. Neuropsych/cognition: This patient may be capable of making decisions on his own behalf. 6. Skin/Wound Care: routine pressure relief measures    7. Fluids/Electrolytes/Nutrition: Monitor I/O  - Per speech past  swallow study continue regular diet w/ Ensure and multivitamin supplements-- reordered from prior admission orders   -encouraging po 8/25 K+ 3.7, continue daily supp for now 8/27 labs ordered for tomorrow 8.  Bladder Cancer s/p nephrostomy tubes: stage IV, metastatic to the bone by MRI of the spine on 11/08/2023.  -Hx of prostate CA, on androgen therapy- leupron q6mos-- follows with [facility] Hem[facility]Onc -prior UTI: Ceftriaxone  1g daily completed 8/10   9. AKI: Monitor BMP w/GFR  Cr 1.22--1.42--1.52>> 1.16 on 8/13.   - 8/18 Cr 1.3 today, ?baseline renal insufficiency?  -encourage fluids -12/10/23 Cr 1.19 yesterday, monitor 10.  Anemia: Hemoglobin  stable post PRBC 2 units Hgb 9.9-- downtrended during readmission, 9.0--8.5--8.5--7.7--7.6 but up to 9.2 on 8/17.  -12/05/23 Hgb 8.3, FOBT neg. 8/25 hgb essentially holding at 7.9 today  -received 1u prbc on rehab so far -anemia is normocytic. Suspect he has pancytopenia d/t bladder ca. Could be some GI loss related to previously supratherapeutic INR   -avoid supratherapeutic INR's   -recheck stool x 1 (have not received a second sample)   -continue multivitamin,  b complex and add ferrous sulfate   8/27 no changes, recheck labs Thursday 11. HTN now with hypotension: hx of orthostasis which resolved after IV fluids--now holding home amlodipine  5 mg daily and Carvdilol 12.5 mg - resume as needed. Avoid hypotension -8/25-26  blood pressures reasonable while still holding meds, no changes   Vitals:   12/10/23 1919 12/11/23 0339 12/11/23 1344 12/11/23 1956  BP: 139/80 125/69 (!) 140/72 125/78   12/12/23 0515 12/12/23 1336 12/12/23 1956 12/13/23 0401  BP: 128/70 122/65 (!) 142/77 133/63   12/13/23 1421 12/13/23 1946 12/14/23 0402 12/14/23 1316  BP: 134/65 (!) 140/67 (!) 140/67 132/60      13. HLD: LDL 112, goal < 70, atorvastatin  80mg  daily   14. Obesity: Educate on diet and weight loss to promote overall health and mobility.  Body mass index is 33.06  kg/m.   15. Hx of laryngeal cancer s/p radiation therapy, voice sounds mildly hoarse  16. GERD: reordered pepcid  20mg  daily PRN since it appears that's how he was using it; might also warrant Protonix ? Monitor.   17. Elevated LFTs: AST 67/ALT 68, alkphos/bili WNL on 8/9. Related to Ca?  -repeat LFTs  perhaps sl improved. Follow up again 8/25    -consider changing statin    -8/25 need to add LFT's on to this morning's labs 18. Thrombocytopenia/pancytopenia:   -8/25 platelets   89k -> 72k -> 66k ->71k  -likely d/t metastatic bladder cancer -monitor serially -NP spoke with oncology last week. No new recs made  19 LE edema, L>R  -likely related to albumin /nutritional status. Recent EF 50-55% on ECHO   Filed Weights   12/03/23 1555 12/10/23 1555  Weight: 103.2 kg 104.9 kg    -weights fairly stable  -elevate. Found compression stockings pt wore at home   LOS: 11 days A FACE TO FACE EVALUATION WAS PERFORMED  Arthea ONEIDA Gunther 12/14/2023, 5:58 PM

## 2023-12-15 DIAGNOSIS — Z8673 Personal history of transient ischemic attack (TIA), and cerebral infarction without residual deficits: Secondary | ICD-10-CM

## 2023-12-15 DIAGNOSIS — R195 Other fecal abnormalities: Secondary | ICD-10-CM

## 2023-12-15 DIAGNOSIS — Z7901 Long term (current) use of anticoagulants: Secondary | ICD-10-CM

## 2023-12-15 LAB — BASIC METABOLIC PANEL WITH GFR
Anion gap: 9 (ref 5–15)
BUN: 19 mg/dL (ref 8–23)
CO2: 22 mmol/L (ref 22–32)
Calcium: 8.1 mg/dL — ABNORMAL LOW (ref 8.9–10.3)
Chloride: 108 mmol/L (ref 98–111)
Creatinine, Ser: 1.19 mg/dL (ref 0.61–1.24)
GFR, Estimated: >60 mL/min (ref >60)
Glucose, Bld: 104 mg/dL — ABNORMAL HIGH (ref 70–99)
Potassium: 3.6 mmol/L (ref 3.5–5.1)
Sodium: 139 mmol/L (ref 135–145)

## 2023-12-15 LAB — CBC
HCT: 22.2 % — ABNORMAL LOW (ref 39.0–52.0)
Hemoglobin: 7.1 g/dL — ABNORMAL LOW (ref 13.0–17.0)
MCH: 30.2 pg (ref 26.0–34.0)
MCHC: 32 g/dL (ref 30.0–36.0)
MCV: 94.5 fL (ref 80.0–100.0)
Platelets: 72 K/uL — ABNORMAL LOW (ref 150–400)
RBC: 2.35 MIL/uL — ABNORMAL LOW (ref 4.22–5.81)
RDW: 20.8 % — ABNORMAL HIGH (ref 11.5–15.5)
WBC: 5.7 K/uL (ref 4.0–10.5)
nRBC: 0 % (ref 0.0–0.2)

## 2023-12-15 LAB — PROTIME-INR
INR: 2 — ABNORMAL HIGH (ref 0.8–1.2)
Prothrombin Time: 23.4 s — ABNORMAL HIGH (ref 11.4–15.2)

## 2023-12-15 LAB — OCCULT BLOOD X 1 CARD TO LAB, STOOL: Fecal Occult Bld: POSITIVE — AB

## 2023-12-15 LAB — PREPARE RBC (CROSSMATCH)

## 2023-12-15 MED ORDER — SODIUM CHLORIDE 0.9% IV SOLUTION: Freq: Once | INTRAVENOUS | Status: AC

## 2023-12-15 MED ORDER — DIPHENHYDRAMINE HCL 25 MG PO CAPS
25.0000 mg | ORAL_CAPSULE | Freq: Once | ORAL | Status: AC
  Administered 2023-12-15: 25 mg via ORAL
  Filled 2023-12-15: qty 1

## 2023-12-15 MED ORDER — PANTOPRAZOLE SODIUM 40 MG PO TBEC
40.0000 mg | DELAYED_RELEASE_TABLET | Freq: Two times a day (BID) | ORAL | Status: DC
  Administered 2023-12-15 – 2023-12-19 (×7): 40 mg via ORAL
  Filled 2023-12-15 (×7): qty 1

## 2023-12-15 MED ORDER — DARBEPOETIN ALFA 300 MCG/0.6ML IJ SOSY
300.0000 ug | PREFILLED_SYRINGE | Freq: Once | INTRAMUSCULAR | Status: AC
  Administered 2023-12-15: 300 ug via SUBCUTANEOUS
  Filled 2023-12-15: qty 0.6

## 2023-12-15 MED ORDER — ACETAMINOPHEN 325 MG PO TABS
650.0000 mg | ORAL_TABLET | Freq: Once | ORAL | Status: AC
  Administered 2023-12-15: 650 mg via ORAL
  Filled 2023-12-15: qty 2

## 2023-12-15 MED ORDER — FUROSEMIDE 10 MG/ML IJ SOLN
20.0000 mg | Freq: Once | INTRAMUSCULAR | Status: AC
  Administered 2023-12-15: 20 mg via INTRAVENOUS
  Filled 2023-12-15: qty 2

## 2023-12-15 MED ORDER — WARFARIN SODIUM 2 MG PO TABS
2.0000 mg | ORAL_TABLET | Freq: Once | ORAL | Status: AC
  Administered 2023-12-15: 2 mg via ORAL
  Filled 2023-12-15: qty 1

## 2023-12-15 NOTE — Progress Notes (Signed)
 Physical Therapy Session Note  Patient Details  Name: Allen Macdonald MRN: [id number] Date of Birth: 24-Jun-1950  Today's Date: 12/15/2023 PT Individual Time: 0908-0953 PT Individual Time Calculation (min): 45 min   Short Term Goals: Week 1:  PT Short Term Goal 1 (Week 1): Pt will complete transfers with CGA consistently PT Short Term Goal 1 - Progress (Week 1): Met PT Short Term Goal 2 (Week 1): Pt will complete up/down 4 steps with CGA and BHRs PT Short Term Goal 2 - Progress (Week 1): Met PT Short Term Goal 3 (Week 1): Pt will ambulate 150' with RW CGA PT Short Term Goal 3 - Progress (Week 1): Met   Skilled Therapeutic Interventions/Progress Updates:    Session focused on functional gait, transfers, stair negotiation for home entry, education on energy conservation and return home/community, NMR for balance retraining and fine motor tasks, and overall functional strengthening/endurance. Pt performed transfers with RW with supervision throughout session. Functional gait on unit x 125' x 2 with RW with supervision, occasional cues for increased R step length and stance time. Stair negotiation training per pt request for home entry practice x 4 steps with B rails (pt has fridge on L at home) with CGA overall for balance. Pt with increased exertion noted and rest breaks needed throughout session between activities.   NMR for balance retraining and coordination and strengthening for toe taps to soft cone x 10 reps each side BLE with BUE support. Then focused on fine motor tasks and standing balance/endurance without UE support to manipulate screws/washers/nuts with close supervision for balance. Rest breaks needed.   Returned to room and pt set up in recliner with all needs in reach.   Therapy Documentation Precautions:  Precautions Precautions: Fall Recall of Precautions/Restrictions: Intact Precaution/Restrictions Comments: bilat nephrostomy bags, fall, aphasia, mild R  hemi Restrictions Weight Bearing Restrictions Per Provider Order: No  Pain: Pain Assessment Pain Scale: 0-10 Pain Score: 0-No painDenies pain.    Therapy/Group: Individual Therapy  [name] [name] [name] Elnor, PT, DPT, CBIS  12/15/2023, 10:01 AM

## 2023-12-15 NOTE — Progress Notes (Signed)
 PROGRESS NOTE   Subjective/Complaints:  Up with PT. Feeling tired but still working thru therapy  ROS: Patient denies fever, rash, sore throat, blurred vision, dizziness, nausea, vomiting, diarrhea, cough, shortness of breath or chest pain, joint or back/neck pain, headache, or mood change.     Objective:   No results found. Recent Labs    12/15/23 0330  WBC 5.7  HGB 7.1*  HCT 22.2*  PLT 72*   Recent Labs    12/15/23 0330  NA 139  K 3.6  CL 108  CO2 22  GLUCOSE 104*  BUN 19  CREATININE 1.19  CALCIUM  8.1*         Intake/Output Summary (Last 24 hours) at 12/15/2023 0952 Last data filed at 12/15/2023 0757 Gross per 24 hour  Intake 962 ml  Output 1200 ml  Net -238 ml        Physical Exam: Vital Signs Blood pressure 128/73, pulse 98, temperature 97.9 F (36.6 C), resp. rate 19, height 5' 10 (1.778 m), weight 104.9 kg, SpO2 96%.  Constitutional: No distress . Vital signs reviewed. HEENT: NCAT, EOMI, oral membranes moist Neck: supple Cardiovascular: RRR without murmur. No JVD    Respiratory/Chest: CTA Bilaterally without wheezes or rales. Normal effort    GI/Abdomen: BS +, non-tender, non-distended Ext: no clubbing, cyanosis. 1+ edema L>R with socks in place Psych: pleasant and cooperative  Uro: nephrostomy tubes in place with yellow urine Skin: intact over exposed surfaces. bruises Neuro: Alert and oriented x2, right sided strength is 4/5, otherwise strength is 5/5. Sensory exam normal for light touch and pain in all 4 limbs. No limb ataxia or cerebellar signs. No abnormal tone appreciated.  Still with non-fluent speech, takes his time. Prior neuro assessment is c/w 12/15/2023 exam.   Assessment/Plan: 1. Functional deficits which require 3+ hours per day of interdisciplinary therapy in a comprehensive inpatient rehab setting. Physiatrist is providing close team supervision and 24 hour management of  active medical problems listed below. Physiatrist and rehab team continue to assess barriers to discharge/monitor patient progress toward functional and medical goals  Care Tool:  Bathing    Body parts bathed by patient: Right arm, Left arm, Chest, Abdomen, Right upper leg, Left upper leg, Front perineal area, Face   Body parts bathed by helper: Buttocks, Right lower leg, Left lower leg     Bathing assist Assist Level: Minimal Assistance - Patient > 75%     Upper Body Dressing/Undressing Upper body dressing   What is the patient wearing?: Pull over shirt    Upper body assist Assist Level: Independent    Lower Body Dressing/Undressing Lower body dressing      What is the patient wearing?: Pants, Incontinence brief     Lower body assist Assist for lower body dressing: Supervision/Verbal cueing     Toileting Toileting    Toileting assist Assist for toileting: Contact Guard/Touching assist     Transfers Chair/bed transfer  Transfers assist  Chair/bed transfer activity did not occur: Safety/medical concerns  Chair/bed transfer assist level: Supervision/Verbal cueing     Locomotion Ambulation   Ambulation assist      Assist level: Supervision/Verbal cueing Assistive device: Walker-rolling Max  distance: 150+   Walk 10 feet activity   Assist     Assist level: Supervision/Verbal cueing Assistive device: Walker-rolling   Walk 50 feet activity   Assist    Assist level: Supervision/Verbal cueing Assistive device: Walker-rolling    Walk 150 feet activity   Assist Walk 150 feet activity did not occur: Safety/medical concerns (fatigue)  Assist level: Supervision/Verbal cueing Assistive device: Walker-rolling    Walk 10 feet on uneven surface  activity   Assist Walk 10 feet on uneven surfaces activity did not occur: Safety/medical concerns (fatigue)   Assist level: Supervision/Verbal cueing Assistive device: Walker-rolling    Wheelchair     Assist Is the patient using a wheelchair?: Yes Type of Wheelchair: Manual    Wheelchair assist level: Dependent - Patient 0%      Wheelchair 50 feet with 2 turns activity    Assist        Assist Level: Dependent - Patient 0%   Wheelchair 150 feet activity     Assist      Assist Level: Dependent - Patient 0%   Blood pressure 128/73, pulse 98, temperature 97.9 F (36.6 C), resp. rate 19, height 5' 10 (1.778 m), weight 104.9 kg, SpO2 96%.  Medical Problem List and Plan: 1. Functional deficits secondary to left MCA scattered infarcts with left ICA and left MCA occlusion s/p IR with TICI2c followed by L ICA and basilar tip emboli s/p mechanical thrombectomy TIC3, likely due to hypercoagulable state in setting of cancer             -patient may shower, cover incision/nephrostomy tubes             -ELOS/Goals: 12/17/23.  PT/OT sup to min A, SLP supervision              -Continue CIR therapies including PT, OT, and SLP  2.  Antithrombotics: -DVT: hx of multiple BL DVT and PE had IVC filter placed 2014  -US  LE positive for age indeterminate DVTs: RT - PopV and PTV LT - CFV, PFV, FV, PopV, Gastrocs  -Probable hypercoagulable state r/t carcinoma  -Pharmaceutical: previously Eliquis  5mg  BID,  heparin  drip transitioned to   coumadin    -appreciate pharmacy monitoring of INR--therapeutic             -antiplatelet therapy: N/A 3. Pain Management: Tylenol  prn  4. Mood/Behavior/Sleep: LCSW to follow for evaluation and support when available.              -antipsychotic agents: N/A  -melatonin PRN 5. Neuropsych/cognition: This patient may be capable of making decisions on his own behalf. 6. Skin/Wound Care: routine pressure relief measures    7. Fluids/Electrolytes/Nutrition: Monitor I/O  - Per speech past swallow study continue regular diet w/ Ensure and multivitamin supplements-- reordered from prior admission orders   -encouraging po 8/28 K+ 3.6,  continue supp 8.  Bladder Cancer s/p nephrostomy tubes: stage IV, metastatic to the bone by MRI of the spine on 11/08/2023.  -Hx of prostate CA, on androgen therapy- leupron q6mos-- follows with [facility] Heme[facility]Onc -prior UTI: Ceftriaxone  1g daily completed 8/10   9. AKI: Monitor BMP w/GFR  Cr 1.22--1.42--1.52>> 1.16 on 8/13.   - 8/18 Cr 1.3 today, ?baseline renal insufficiency?  -encourage fluids -12/10/23 Cr 1.19 yesterday, monitor 10.  Anemia: Hemoglobin stable post PRBC 2 units Hgb 9.9-- downtrended during readmission, 9.0--8.5--8.5--7.7--7.6 but up to 9.2 on 8/17.  -12/05/23 Hgb 8.3, FOBT neg. 8/25 hgb essentially holding at 7.9 today  -received  1u prbc on rehab so far -anemia is normocytic. Suspect he has pancytopenia d/t bladder ca. Could be some GI loss related to previously supratherapeutic INR   -avoid supratherapeutic INR's   -recheck stool x 1 (have not received a second sample)   -continue multivitamin,  b complex and add ferrous sulfate   8/28 hgb drifting down again. Transfuse another unit of PRBC, spoke with pt   -need stool sample today, spoke with nurse   -will need fairly prompt f/u with oncology at discharge   -?erythropoietin candidate? 11. HTN now with hypotension: hx of orthostasis which resolved after IV fluids--now holding home amlodipine  5 mg daily and Carvdilol 12.5 mg - resume as needed. Avoid hypotension -8/28 blood pressures reasonable while still holding meds, no changes   Vitals:   12/11/23 1344 12/11/23 1956 12/12/23 0515 12/12/23 1336  BP: (!) 140/72 125/78 128/70 122/65   12/12/23 1956 12/13/23 0401 12/13/23 1421 12/13/23 1946  BP: (!) 142/77 133/63 134/65 (!) 140/67   12/14/23 0402 12/14/23 1316 12/14/23 1936 12/15/23 0518  BP: (!) 140/67 132/60 (!) 146/70 128/73      13. HLD: LDL 112, goal < 70, atorvastatin  80mg  daily   14. Obesity: Educate on diet and weight loss to promote overall health and mobility.  Body mass index is 33.06 kg/m.   15. Hx of  laryngeal cancer s/p radiation therapy, voice sounds mildly hoarse  16. GERD: reordered pepcid  20mg  daily PRN since it appears that's how he was using it; might also warrant Protonix ? Monitor.   17. Elevated LFTs: AST 67/ALT 68, alkphos/bili WNL on 8/9. Related to Ca?  -repeat LFTs  perhaps sl improved. Follow up again 8/25    -consider changing statin    -8/25 need to add LFT's on to this morning's labs 18. Thrombocytopenia/pancytopenia:   -8/28 platelets   89k -> 72k -> 66k ->71k > 72k  -likely d/t metastatic bladder cancer -monitor serially -NP spoke with oncology last week. No new recs made   -see discussion above 19 LE edema, L>R  -likely related to albumin /nutritional status. Recent EF 50-55% on ECHO   Filed Weights   12/03/23 1555 12/10/23 1555  Weight: 103.2 kg 104.9 kg    -weights fairly stable  -elevate.   compression stockings pt wore at home   LOS: 12 days A FACE TO FACE EVALUATION WAS PERFORMED  Allen Macdonald 12/15/2023, 9:52 AM

## 2023-12-15 NOTE — Progress Notes (Signed)
 Patient ID: Allen Macdonald, male   DOB: 09-Dec-1950, 73 y.o.   MRN: [id number] Met with pt and wife who is here to give team conference update and plan still for discharge date 8/30. Have ordered rollator and home health arranged via [facility] to provide follow up.

## 2023-12-15 NOTE — Progress Notes (Signed)
 Occupational Therapy Session Note  Patient Details  Name: Allen Macdonald MRN: [id number] Date of Birth: [birth year]  Today's Date: 12/15/2023 OT Individual Time: 8582-8494 OT Individual Time Calculation (min): 48 min    Short Term Goals: Week 2:  OT Short Term Goal 1 (Week 2): STG= LTGs  Skilled Therapeutic Interventions/Progress Updates:  Pt greeted seated in recliner, pt agreeable to OT intervention.      family education provided to pts wife on the below topics:  -recommendation on use of Rollator for functional ambulation - general assist needed for ADLS and functional mobility ( I.e supervision) -recommendation to try to not sit for longer than 1 hour at a time -general education provided on R sided weakness and R inattention - discussed DME needs -reviewed all HEPs with pt and wife with both verbalizing understanding    ADLs:   Toileting: pt able to empty nephrostomy tubes with MIN A with this therapist holding canister and pt manipulating opening    Exercises:pt completed below BUE therex for improved strengthening and endurance with level 2 theraband: X10 shoulder flexion  X10 bicep curls X10 shoulder horizontal ABD X10 shoulder diagonal pulls X10 shoulder extension    Issued pt written HEP to increase carryover                    Ended session with pt seated in recliner with all needs within reach.   Therapy Documentation Precautions:  Precautions Precautions: Fall Recall of Precautions/Restrictions: Intact Precaution/Restrictions Comments: bilat nephrostomy bags, fall, aphasia, mild R hemi Restrictions Weight Bearing Restrictions Per Provider Order: No  Pain: No pain    Therapy/Group: Individual Therapy  [name] 12/15/2023, 3:21 PM

## 2023-12-15 NOTE — Progress Notes (Signed)
 PHARMACY - ANTICOAGULATION CONSULT NOTE  Pharmacy Consult: Warfarin Indication: History of VTE with recurrent CVAs  Allergies  Allergen Reactions   Bee Venom Anaphylaxis    Patient Measurements: Height: 5' 10 (177.8 cm) Weight: 104.9 kg (231 lb 4.2 oz) IBW/kg (Calculated) : 73  Vital Signs: Temp: 97.9 F (36.6 C) (08/28 0518) BP: 128/73 (08/28 0518) Pulse Rate: 98 (08/28 0518)  Labs: Recent Labs    12/13/23 0402 12/14/23 0433 12/15/23 0330  HGB  --   --  7.1*  HCT  --   --  22.2*  PLT  --   --  72*  LABPROT 23.2* 21.8* 23.4*  INR 1.9* 1.8* 2.0*  CREATININE  --   --  1.19    Estimated Creatinine Clearance: 65.1 mL/min (by C-G formula based on SCr of 1.19 mg/dL).  Assessment: 77 YOM with 2 recent L MCA strokes s/p MT each time.  Patient went to Rehab and was receiving Eliquis , last dose on 8/12 at 0847.  Patient returned to the inpatient side with recurrent CVA on 11/29/23.  MRI shows petechial hemorrhage with new/increased scattered punctate infarcts and new microhemorrhages.   Pharmacy consulted to dose warfarin. Stopped Eliquis .  8/21 PRBC x 1  INR is therapeutic at 2 this morning. Pt consistently eating ~75-100% of meals.     Goal of Therapy:  INR 2 to 3 Monitor platelets by anticoagulation protocol: Yes  Plan:  Warfarin 2 mg PO x1  Check INR daily while on warfarin CBC Mon/Thurs  Monitor clinical course, s/sx of bleed, PO intake/diet, Drug-Drug Interactions   Thank you for allowing pharmacy to be a part of this patient's care.   Bascom JAYSON Louder, PharmD 12/15/2023 8:42 AM  **Pharmacist phone directory can be found on [email or web address] listed under [facility]**

## 2023-12-15 NOTE — Progress Notes (Signed)
 Speech Language Pathology Daily Session Note  Patient Details  Name: Allen Macdonald MRN: [id number] Date of Birth: 11/12/1950  Today's Date: 12/15/2023 SLP Individual Time: 0816-0900 SLP Individual Time Calculation (min): 44 min  Short Term Goals: Week 2: SLP Short Term Goal 1 (Week 2): STG = LTG due to ELOS  Skilled Therapeutic Interventions: Skilled therapy session focused on communication goals. SLP facilitated session by prompting patient to name items found in [place] for each letter of the alphabet. Patient did so with supervision-min A. SLP continued to challenge patient through word association task. Patient with 86% accuracy independently when prompting to match word cues to word given three options. Patient left in chair with alarm set and call bell in reach. Continue POC  Pain denies  Therapy/Group: Individual Therapy  Dalvin Clipper M.A., CCC-SLP 12/15/2023, 7:18 AM

## 2023-12-15 NOTE — Discharge Instructions (Addendum)
 Inpatient Rehab Discharge Instructions  Allen Macdonald Discharge date and time: 12/19/23 10:38 AM  Activities/Precautions/ Functional Status: Activity: no lifting, driving, or strenuous exercise for until cleared  Diet: regular diet Wound Care: keep wound clean and dry, reinforce dressing PRN, and apply mupirocin   Functional status:  ___ No restrictions     ___ Walk up steps independently ___ 24/7 supervision/assistance   ___ Walk up steps with assistance ___ Intermittent supervision/assistance  ___ Bathe/dress independently _X__ Walk with walker     ___ Bathe/dress with assistance ___ Walk Independently    ___ Shower independently ___ Walk with assistance    ___ Shower with assistance _X__ No alcohol     ___ Return to work/school ________  Special Instructions:  -Follow up labs 12/22/2023 with home health    My questions have been answered and I understand these instructions. I will adhere to these goals and the provided educational materials after my discharge from the hospital.  Patient/Caregiver Signature _______________________________ Date __________  Clinician Signature _______________________________________ Date __________  Please bring this form and your medication list with you to all your follow-up doctor's appointments.       COMMUNITY REFERRALS UPON DISCHARGE:    Home Health:   PT   OT    SP                Agency:[facility]  Phone:[phone]   Medical Equipment/Items Ordered:ROLLATOR                                                 Agency/Supplier:[facility]   [phone]    Information on my medicine - Coumadin    (Warfarin)  Why was Coumadin  prescribed for you? Coumadin  was prescribed for you because you have a blood clot or a medical condition that can cause an increased risk of forming blood clots. Blood clots can cause serious health problems by blocking the flow of blood to the heart, lung, or brain. Coumadin  can prevent harmful  blood clots from forming. As a reminder your indication for Coumadin  is:  history of stroke and DVT/PE  What test will check on my response to Coumadin ? While on Coumadin  (warfarin) you will need to have an INR test regularly to ensure that your dose is keeping you in the desired range. The INR (international normalized ratio) number is calculated from the result of the laboratory test called prothrombin time (PT).  If an INR APPOINTMENT HAS NOT ALREADY BEEN MADE FOR YOU please schedule an appointment to have this lab work done by your health care provider within 7 days. Your INR goal is usually a number between:  2 to 3 or your provider may give you a more narrow range like 2-2.5.  Ask your health care provider during an office visit what your goal INR is.  What  do you need to  know  About  COUMADIN ? Take Coumadin  (warfarin) exactly as prescribed by your healthcare provider about the same time each day.  DO NOT stop taking without talking to the doctor who prescribed the medication.  Stopping without other blood clot prevention medication to take the place of Coumadin  may increase your risk of developing a new clot or stroke.  Get refills before you run out.  What do you do if you miss a dose? If you miss a dose, take it as soon  as you remember on the same day then continue your regularly scheduled regimen the next day.  Do not take two doses of Coumadin  at the same time.  Important Safety Information A possible side effect of Coumadin  (Warfarin) is an increased risk of bleeding. You should call your healthcare provider right away if you experience any of the following: Bleeding from an injury or your nose that does not stop. Unusual colored urine (red or dark brown) or unusual colored stools (red or black). Unusual bruising for unknown reasons. A serious fall or if you hit your head (even if there is no bleeding).  Some foods or medicines interact with Coumadin  (warfarin) and might alter  your response to warfarin. To help avoid this: Eat a balanced diet, maintaining a consistent amount of Vitamin K. Notify your provider about major diet changes you plan to make. Avoid alcohol or limit your intake to 1 drink for women and 2 drinks for men per day. (1 drink is 5 oz. wine, 12 oz. beer, or 1.5 oz. liquor.)  Make sure that ANY health care provider who prescribes medication for you knows that you are taking Coumadin  (warfarin).  Also make sure the healthcare provider who is monitoring your Coumadin  knows when you have started a new medication including herbals and non-prescription products.  Coumadin  (Warfarin)  Major Drug Interactions  Increased Warfarin Effect Decreased Warfarin Effect  Alcohol (large quantities) Antibiotics (esp. Septra/Bactrim, Flagyl, Cipro) Amiodarone (Cordarone) Aspirin  (ASA) Cimetidine (Tagamet) Megestrol (Megace) NSAIDs (ibuprofen, naproxen, etc.) Piroxicam (Feldene) Propafenone (Rythmol SR) Propranolol (Inderal) Isoniazid (INH) Posaconazole (Noxafil) Barbiturates (Phenobarbital) Carbamazepine (Tegretol) Chlordiazepoxide (Librium) Cholestyramine (Questran) Griseofulvin Oral Contraceptives Rifampin Sucralfate (Carafate) Vitamin K   Coumadin  (Warfarin) Major Herbal Interactions  Increased Warfarin Effect Decreased Warfarin Effect  Garlic Ginseng Ginkgo biloba Coenzyme Q10 Green tea St. John's wort    Coumadin  (Warfarin) FOOD Interactions  Eat a consistent number of servings per week of foods HIGH in Vitamin K (1 serving =  cup)  Collards (cooked, or boiled & drained) Kale (cooked, or boiled & drained) Mustard greens (cooked, or boiled & drained) Parsley *serving size only =  cup Spinach (cooked, or boiled & drained) Swiss chard (cooked, or boiled & drained) Turnip greens (cooked, or boiled & drained)  Eat a consistent number of servings per week of foods MEDIUM-HIGH in Vitamin K (1 serving = 1 cup)  Asparagus (cooked, or  boiled & drained) Broccoli (cooked, boiled & drained, or raw & chopped) Brussel sprouts (cooked, or boiled & drained) *serving size only =  cup Lettuce, raw (green leaf, endive, romaine) Spinach, raw Turnip greens, raw & chopped   These websites have more information on Coumadin  (warfarin):  www.coumadin .com; www.ahrq.gov/consumer/coumadin .htm;

## 2023-12-15 NOTE — H&P (View-Only) (Signed)
 Consultation  Referring Provider: Rehab/ [facility] Primary Care Physician:  Ina Marcellus RAMAN, MD Primary Gastroenterologist:  unassigned  Reason for Consultation: Anemia, heme positive stool in setting of Coumadin   HPI: Allen Macdonald is a 73 y.o. male with history of stage IV bladder cancer, DVT/PE on previous Eliquis , history of squamous cell laryngeal cancer, prostate cancer and with history of already myopathy Most recent echo August 2025 with EF 50 to 55%, aortic valve sclerosis but no stenosis. Patient with complicated recent history.  He was initially admitted on 11/20/2023 with an acute right-sided weakness left gaze and aphasia.  He required emergent thrombectomy.  Found to have right punctate infarcts in the right occipital and bilateral cerebellar hemispheres. He had hemoglobin of 9.0 on admission/hematocrit 28.6. He was transition to Coumadin . He was able to be transferred to rehab on 11/25/2023 Unfortunately on 11/29/2023 had acute onset of aphasia right-sided weakness and right facial droop, and required a second thrombectomy.  Is felt to be hypercoagulable in the state of advanced cancer He is now back on rehab recuperating. He did have an episode of supratherapeutic INR on 12/05/2023 with INR approaching 7.  He had required a couple of transfusions prior to being transferred to rehab and this was felt secondary to acute blood loss anemia I believe associated with thrombectomies. On 8/14 hemoglobin was 8.5, he was heme-negative at that time On 8/18 hemoglobin 8.3 On 8/25 INR 2.0/hemoglobin 7.9 Today hemoglobin 7.1/platelets 76/INR 2.0  Stool noted Hemoccult positive today and GI was called.  He is receiving 1 unit of packed RBCs.  Patient reports that he has not had any prior GI evaluation, no prior EGD or colonoscopy.  He has no current GI complaints, has not noted any melena or hematochezia, says his bowel habits are usually regular.  He has no complaint of heartburn or  indigestion no dysphagia, no nausea or vomiting. He has been on Pepcid .    Past Medical History:  Diagnosis Date   Abnormal stress test minimal ischemia involving apex 05/01/2019   Bladder cancer (HCC)    Dilated cardiomyopathy (HCC)    Essential hypertension 12/18/2015   History of DVT (deep vein thrombosis) 04/22/2017   History of pulmonary embolism 12/18/2015   Laryngeal cancer (HCC) squamous cell 02/25/2020   PC (prostate cancer) (HCC) 08/28/2019    Past Surgical History:  Procedure Laterality Date   CATARACT EXTRACTION Bilateral    INSERTION PROSTATE RADIATION SEED  2003   IR CT HEAD LTD  11/20/2023   IR PERCUTANEOUS ART THROMBECTOMY/INFUSION INTRACRANIAL INC DIAG ANGIO  11/20/2023   IR PERCUTANEOUS ART THROMBECTOMY/INFUSION INTRACRANIAL INC DIAG ANGIO  11/21/2023   IR PERCUTANEOUS ART THROMBECTOMY/INFUSION INTRACRANIAL INC DIAG ANGIO  11/29/2023   IR THROMBECT SEC MECH MOD SED  11/29/2023   IR US  GUIDE VASC ACCESS RIGHT  11/20/2023   IR US  GUIDE VASC ACCESS RIGHT  11/21/2023   IR US  GUIDE VASC ACCESS RIGHT  11/29/2023   PROSTATE SURGERY     RADIOLOGY WITH ANESTHESIA N/A 11/20/2023   Procedure: RADIOLOGY WITH ANESTHESIA;  Surgeon: Radiologist, Medication, MD;  Location: [facility] OR;  Service: Radiology;  Laterality: N/A;   RADIOLOGY WITH ANESTHESIA N/A 11/21/2023   Procedure: RADIOLOGY WITH ANESTHESIA;  Surgeon: Radiologist, Medication, MD;  Location: [facility] OR;  Service: Radiology;  Laterality: N/A;   RADIOLOGY WITH ANESTHESIA N/A 11/29/2023   Procedure: RADIOLOGY WITH ANESTHESIA;  Surgeon: Radiologist, Medication, MD;  Location: [facility] OR;  Service: Radiology;  Laterality: N/A;   TONSILLECTOMY  Venacava Filter     WISDOM TOOTH EXTRACTION      Prior to Admission medications   Medication Sig Start Date End Date Taking? Authorizing Provider  heparin  25000 UT/250ML infusion Inject 900 Units/hr into the vein continuous. 12/03/23   Waddell Karna LABOR, NP  warfarin (COUMADIN ) 10 MG tablet Take 1 tablet  (10 mg total) by mouth one time only at 4 PM. 12/03/23   Waddell Karna LABOR, NP    Current Facility-Administered Medications  Medication Dose Route Frequency Provider Last Rate Last Admin   0.9 %  sodium chloride  infusion (Manually program via Guardrails IV Fluids)   Intravenous Once Angiulli, Toribio PARAS, PA-C       acetaminophen  (TYLENOL ) tablet 650 mg  650 mg Oral Q6H PRN Raulkar, Krutika P, MD   650 mg at 12/09/23 1548   alum & mag hydroxide-simeth (MAALOX/MYLANTA) 200-200-20 MG/5ML suspension 30 mL  30 mL Oral Q4H PRN Street, [place], PA-C       atorvastatin  (LIPITOR ) tablet 80 mg  80 mg Oral Daily Raulkar, Sven SQUIBB, MD   80 mg at 12/15/23 0756   B-complex with vitamin C tablet 1 tablet  1 tablet Oral Daily Swartz, Zachary T, MD   1 tablet at 12/15/23 9243   Chlorhexidine  Gluconate Cloth 2 % PADS 6 each  6 each Topical Q12H Carilyn Prentice BRAVO, MD   6 each at 12/15/23 1648   famotidine  (PEPCID ) tablet 20 mg  20 mg Oral Daily PRN Street, [place], [place]       feeding supplement (ENSURE PLUS HIGH PROTEIN) liquid 237 mL  237 mL Oral BID BM Street, [place], [place]   237 mL at 12/15/23 1550   ferrous sulfate  tablet 325 mg  325 mg Oral Q breakfast Swartz, Zachary T, MD   325 mg at 12/15/23 0757   guaiFENesin -dextromethorphan (ROBITUSSIN DM) 100-10 MG/5ML syrup 10 mL  10 mL Oral Q6H PRN Street, [place], PA-C       melatonin tablet 5 mg  5 mg Oral QHS PRN Street, [place], PA-C       multivitamin with minerals tablet 1 tablet  1 tablet Oral Daily [address] [place], PA-C   1 tablet at 12/15/23 0756   polyethylene glycol (MIRALAX  / GLYCOLAX ) packet 17 g  17 g Oral Daily PRN Street, [place], PA-C   17 g at 12/14/23 1248   potassium chloride  SA (KLOR-CON  M) CR tablet 20 mEq  20 mEq Oral Daily Swartz, Zachary T, MD   20 mEq at 12/15/23 0756   prochlorperazine  (COMPAZINE ) tablet 5-10 mg  5-10 mg Oral Q6H PRN Street, [place], PA-C       sodium chloride  flush (NS) 0.9 % injection 10-40 mL  10-40 mL Intracatheter  Q12H Raulkar, Sven SQUIBB, MD   10 mL at 12/15/23 0757   sodium chloride  flush (NS) 0.9 % injection 10-40 mL  10-40 mL Intracatheter PRN Raulkar, Sven SQUIBB, MD       Warfarin - Pharmacist Dosing Inpatient   Does not apply q1600 Cyndy Ozell DASEN, [facility]   Given at 12/14/23 1713    Allergies as of 12/02/2023 - Review Complete 11/29/2023  Allergen Reaction Noted   Bee venom Anaphylaxis 01/11/2023    Family History  Problem Relation Age of Onset   Heart failure Mother    Pleurisy Mother    Other Father        UNK MEDICAL HX    Social History   Socioeconomic History   Marital status: Married    Spouse  name: [name]   Number of children: 0   Years of education: 12 + 2   Highest education level: Not on file  Occupational History   Occupation: RETIRED CONSTRUCTION  Tobacco Use   Smoking status: Never   Smokeless tobacco: Never  Vaping Use   Vaping status: Never Used  Substance and Sexual Activity   Alcohol use: Yes    Alcohol/week: 14.0 standard drinks of alcohol    Types: 10 Cans of beer, 1 Shots of liquor, 3 Glasses of wine per week   Drug use: No   Sexual activity: Not Currently  Other Topics Concern   Not on file  Social History Narrative   Not on file   Social Drivers of Health   Financial Resource Strain: Not on file  Food Insecurity: No Food Insecurity (11/30/2023)   Hunger Vital Sign    Worried About Running Out of Food in the Last Year: Never true    Ran Out of Food in the Last Year: Never true  Transportation Needs: No Transportation Needs (11/30/2023)   PRAPARE - Administrator, Civil Service (Medical): No    Lack of Transportation (Non-Medical): No  Physical Activity: Not on file  Stress: Not on file  Social Connections: Moderately Integrated (11/30/2023)   Social Connection and Isolation Panel    Frequency of Communication with Friends and Family: Twice a week    Frequency of Social Gatherings with Friends and Family: Twice a week    Attends  Religious Services: 1 to 4 times per year    Active Member of [employer] or Organizations: No    Attends Banker Meetings: Never    Marital Status: Married  Catering manager Violence: Unknown (11/30/2023)   Humiliation, Afraid, Rape, and Kick questionnaire    Fear of Current or Ex-Partner: No    Emotionally Abused: No    Physically Abused: Not on file    Sexually Abused: No    Review of Systems: Pertinent positive and negative review of systems were noted in the above HPI section.  All other review of systems was otherwise negative.   Physical Exam: Vital signs in last 24 hours: Temp:  [97.5 F (36.4 C)-98 F (36.7 C)] 98 F (36.7 C) (08/28 1456) Pulse Rate:  [93-105] 93 (08/28 1456) Resp:  [16-19] 16 (08/28 1456) BP: (118-146)/(60-73) 118/60 (08/28 1456) SpO2:  [96 %-100 %] 100 % (08/28 1456) Last BM Date : 12/14/23 General:   Alert,  Well-developed, well-nourished, chronically ill-appearing elderly white male pleasant and cooperative in NAD, sitting up in chair, bilateral nephrostomy tubes Head:  Normocephalic and atraumatic. Eyes:  Sclera clear, no icterus.   Conjunctiva pink. Ears:  Normal auditory acuity. Nose:  No deformity, discharge,  or lesions. Mouth:  No deformity or lesions.   Neck:  Supple; no masses or thyromegaly. Lungs:  Clear throughout to auscultation.   No wheezes, crackles, or rhonchi.  Heart:  Regular rate and rhythm; no murmurs, clicks, rubs,  or gallops. Abdomen:  Soft, obese, nontender, BS active,nonpalp mass or hsm.  Small umbilical hernia Rectal: Not done, documented heme positive Msk:  Symmetrical without gross deformities. . Pulses:  Normal pulses noted. Extremities:  Without clubbing or edema.  Bilateral nephrostomy tubes with leg bags Neurologic:  Alert and  oriented x4; mentation somewhat slow but appropriate Skin:  Intact without significant lesions or rashes.. Psych:  Alert and cooperative. Normal mood and affect.  Intake/Output from  previous day: 08/27 0701 - 08/28 0700 In:  716 [P.O.:716] Out: 1600 [Urine:1600] Intake/Output this shift: Total I/O In: 722 [P.O.:712; I.V.:10] Out: 1125 [Urine:1125]  Lab Results: Recent Labs    12/15/23 0330  WBC 5.7  HGB 7.1*  HCT 22.2*  PLT 72*   BMET Recent Labs    12/15/23 0330  NA 139  K 3.6  CL 108  CO2 22  GLUCOSE 104*  BUN 19  CREATININE 1.19  CALCIUM  8.1*   LFT No results for input(s): PROT, ALBUMIN , AST, ALT, ALKPHOS, BILITOT, BILIDIR, IBILI in the last 72 hours. PT/INR Recent Labs    12/14/23 0433 12/15/23 0330  LABPROT 21.8* 23.4*  INR 1.8* 2.0*   Hepatitis Panel No results for input(s): HEPBSAG, HCVAB, HEPAIGM, HEPBIGM in the last 72 hours.    IMPRESSION:  #23  73 year old white male with hypercoagulable state in setting of stage IV bladder cancer status post bilateral nephrostomies who was on chronic Eliquis  when he presented with acute CVA 11/20/2023 and required emergent thrombectomy  He was resumed on Eliquis , transferred to rehab and unfortunately had another acute CVA on 11/29/2023 requiring repeat thrombectomy.  He has now been transitioned to Coumadin .  #2 anemia chronic with hemoglobin of 9 at baseline on admission.  He has required a couple of units of blood since initial admission felt in part secondary to thrombectomies  Episode of supratherapeutic INR on 12/05/2019 with INR of 7 hemoglobin 8.3 at that time He has drifted down to 7.1 today/INR 2.0 and stool documented Hemoccult positive for the first time today.  Previous Hemoccults earlier this admission negative.  I think his anemia is multifactorial, and setting of anticoagulation, thrombectomies, metastatic bladder cancer. With heme positive stool, in setting of acute illness will rule out peptic ulcer disease, gastropathy, esophagitis.  #3 history of prostate cancer #4.  Previous history of DVT/PE #5 history of B12 deficiency, and chronic  thrombocytopenia-followed by hematology/[facility]   PLAN: Start Protonix  40 mg daily Keep n.p.o. after midnight We have discussed diagnostic EGD with the patient with Coumadin  on board.  He is agreeable to proceed.  The procedure was discussed in detail including indications risk and benefits and he agrees to proceed tomorrow. If EGD is unremarkable we will have to have further discussions about how aggressively to work this up as he needs to be anticoagulated, if has colonoscopy may need to transition to IV heparin  at [place]. Continue to trend hemoglobin transfuse as needed to keep his hemoglobin above 7 Patient asked that we discuss EGD with his wife, I will contact her by phone this evening and discuss.  I spoke with the patient's wife by phone, she is in agreement with EGD, she will be here in the morning.  Amy Esterwood PA-C 12/15/2023, 4:50 PM    ATTENDING ADDENDUM I personally saw the patient and performed a substantive portion of this encounter (>50% time spent), including a complete performance of at least one of the key components (MDM, Hx and/or Exam), in conjunction with Amy Esterwood.  73 year old male with metastatic bladder cancer, bilateral nephrostomies in place, stroke earlier this month with urgent thrombectomy, had another stroke on August 12 leading to another thrombectomy and transition to Coumadin .  He has had some baseline anemia that is likely multifactorial during his course but has had drift of hemoglobin recently on Coumadin , leading to blood transfusion.  Stool is heme positive, he does not see any overt blood in the stools and has no GI symptoms that bother him.  He is currently anticoagulated, ideally they would  not want to reverse or stop the anticoagulation given his recurrent strokes.  He has never had a prior colonoscopy or EGD.  I discussed with him how aggressive he wanted to be in regards to working up this issue.  Does sound like multifactorial anemia but he  could be having some occult blood loss in the setting of anticoagulation.  I offered him an EGD initially to clear his upper tract as this would be much easier for him without prep.  Following discussion of risks and benefits of EGD and anesthesia he wants to proceed.  If we find clear pathology to cause his anemia on his exam then we will address it.  If the exam is normal, we will then discussed with him if he wanted to pursue colonoscopy or not, and discussed with team if we would want to transition to heparin  drip for that or not.  For now, we will start with EGD, tentatively scheduled for tomorrow.  N.p.o. after midnight.  Call us  in the interim with any questions or concerns.  Starting empiric PPI otherwise.  Marcey Naval, MD [facility]

## 2023-12-15 NOTE — Progress Notes (Addendum)
 RN completed administration of one unit of red blood cells. No s/s of reaction, vitals WDL

## 2023-12-15 NOTE — Progress Notes (Signed)
 Physical Therapy Session Note  Patient Details  Name: Allen Macdonald MRN: [id number] Date of Birth: 06-27-1950  Today's Date: 12/15/2023  Short Term Goals: Week 1:  PT Short Term Goal 1 (Week 1): Pt will complete transfers with CGA consistently PT Short Term Goal 1 - Progress (Week 1): Met PT Short Term Goal 2 (Week 1): Pt will complete up/down 4 steps with CGA and BHRs PT Short Term Goal 2 - Progress (Week 1): Met PT Short Term Goal 3 (Week 1): Pt will ambulate 150' with RW CGA PT Short Term Goal 3 - Progress (Week 1): Met  SESSION 1 Pt missed 30 min of skilled therapy due to needing to get stool sample from nsg and to receive blood. Will re-attempt as schedule and pt availability permits.  SESSION 2 Pt missed 45 min of skilled therapy due to miscommunication between disciplines and pt needing to receive blood transfusion during this time vs earlier in morning. Will re-attempt as schedule and pt availability permits.       Therapy Documentation Precautions:  Precautions Precautions: Fall Recall of Precautions/Restrictions: Intact Precaution/Restrictions Comments: bilat nephrostomy bags, fall, aphasia, mild R hemi Restrictions Weight Bearing Restrictions Per Provider Order: No   Therapy/Group: Individual Therapy  Brenae Lasecki PTA 12/15/2023, 7:53 AM

## 2023-12-15 NOTE — Consult Note (Addendum)
 Consultation  Referring Provider: Rehab/ [facility] Primary Care Physician:  Ina Marcellus RAMAN, MD Primary Gastroenterologist:  unassigned  Reason for Consultation: Anemia, heme positive stool in setting of Coumadin   HPI: Allen Macdonald is a 73 y.o. male with history of stage IV bladder cancer, DVT/PE on previous Eliquis , history of squamous cell laryngeal cancer, prostate cancer and with history of already myopathy Most recent echo August 2025 with EF 50 to 55%, aortic valve sclerosis but no stenosis. Patient with complicated recent history.  He was initially admitted on 11/20/2023 with an acute right-sided weakness left gaze and aphasia.  He required emergent thrombectomy.  Found to have right punctate infarcts in the right occipital and bilateral cerebellar hemispheres. He had hemoglobin of 9.0 on admission/hematocrit 28.6. He was transition to Coumadin . He was able to be transferred to rehab on 11/25/2023 Unfortunately on 11/29/2023 had acute onset of aphasia right-sided weakness and right facial droop, and required a second thrombectomy.  Is felt to be hypercoagulable in the state of advanced cancer He is now back on rehab recuperating. He did have an episode of supratherapeutic INR on 12/05/2023 with INR approaching 7.  He had required a couple of transfusions prior to being transferred to rehab and this was felt secondary to acute blood loss anemia I believe associated with thrombectomies. On 8/14 hemoglobin was 8.5, he was heme-negative at that time On 8/18 hemoglobin 8.3 On 8/25 INR 2.0/hemoglobin 7.9 Today hemoglobin 7.1/platelets 76/INR 2.0  Stool noted Hemoccult positive today and GI was called.  He is receiving 1 unit of packed RBCs.  Patient reports that he has not had any prior GI evaluation, no prior EGD or colonoscopy.  He has no current GI complaints, has not noted any melena or hematochezia, says his bowel habits are usually regular.  He has no complaint of heartburn or  indigestion no dysphagia, no nausea or vomiting. He has been on Pepcid .    Past Medical History:  Diagnosis Date   Abnormal stress test minimal ischemia involving apex 05/01/2019   Bladder cancer (HCC)    Dilated cardiomyopathy (HCC)    Essential hypertension 12/18/2015   History of DVT (deep vein thrombosis) 04/22/2017   History of pulmonary embolism 12/18/2015   Laryngeal cancer (HCC) squamous cell 02/25/2020   PC (prostate cancer) (HCC) 08/28/2019    Past Surgical History:  Procedure Laterality Date   CATARACT EXTRACTION Bilateral    INSERTION PROSTATE RADIATION SEED  2003   IR CT HEAD LTD  11/20/2023   IR PERCUTANEOUS ART THROMBECTOMY/INFUSION INTRACRANIAL INC DIAG ANGIO  11/20/2023   IR PERCUTANEOUS ART THROMBECTOMY/INFUSION INTRACRANIAL INC DIAG ANGIO  11/21/2023   IR PERCUTANEOUS ART THROMBECTOMY/INFUSION INTRACRANIAL INC DIAG ANGIO  11/29/2023   IR THROMBECT SEC MECH MOD SED  11/29/2023   IR US  GUIDE VASC ACCESS RIGHT  11/20/2023   IR US  GUIDE VASC ACCESS RIGHT  11/21/2023   IR US  GUIDE VASC ACCESS RIGHT  11/29/2023   PROSTATE SURGERY     RADIOLOGY WITH ANESTHESIA N/A 11/20/2023   Procedure: RADIOLOGY WITH ANESTHESIA;  Surgeon: Radiologist, Medication, MD;  Location: [facility] OR;  Service: Radiology;  Laterality: N/A;   RADIOLOGY WITH ANESTHESIA N/A 11/21/2023   Procedure: RADIOLOGY WITH ANESTHESIA;  Surgeon: Radiologist, Medication, MD;  Location: [facility] OR;  Service: Radiology;  Laterality: N/A;   RADIOLOGY WITH ANESTHESIA N/A 11/29/2023   Procedure: RADIOLOGY WITH ANESTHESIA;  Surgeon: Radiologist, Medication, MD;  Location: [facility] OR;  Service: Radiology;  Laterality: N/A;   TONSILLECTOMY  Venacava Filter     WISDOM TOOTH EXTRACTION      Prior to Admission medications   Medication Sig Start Date End Date Taking? Authorizing Provider  heparin  25000 UT/250ML infusion Inject 900 Units/hr into the vein continuous. 12/03/23   Waddell Karna LABOR, NP  warfarin (COUMADIN ) 10 MG tablet Take 1 tablet  (10 mg total) by mouth one time only at 4 PM. 12/03/23   Waddell Karna LABOR, NP    Current Facility-Administered Medications  Medication Dose Route Frequency Provider Last Rate Last Admin   0.9 %  sodium chloride  infusion (Manually program via Guardrails IV Fluids)   Intravenous Once Angiulli, Toribio PARAS, PA-C       acetaminophen  (TYLENOL ) tablet 650 mg  650 mg Oral Q6H PRN Raulkar, Krutika P, MD   650 mg at 12/09/23 1548   alum & mag hydroxide-simeth (MAALOX/MYLANTA) 200-200-20 MG/5ML suspension 30 mL  30 mL Oral Q4H PRN Street, [place], PA-C       atorvastatin  (LIPITOR ) tablet 80 mg  80 mg Oral Daily Raulkar, Sven SQUIBB, MD   80 mg at 12/15/23 0756   B-complex with vitamin C tablet 1 tablet  1 tablet Oral Daily Swartz, Zachary T, MD   1 tablet at 12/15/23 9243   Chlorhexidine  Gluconate Cloth 2 % PADS 6 each  6 each Topical Q12H Carilyn Prentice BRAVO, MD   6 each at 12/15/23 1648   famotidine  (PEPCID ) tablet 20 mg  20 mg Oral Daily PRN Street, [place], [place]       feeding supplement (ENSURE PLUS HIGH PROTEIN) liquid 237 mL  237 mL Oral BID BM Street, [place], [place]   237 mL at 12/15/23 1550   ferrous sulfate  tablet 325 mg  325 mg Oral Q breakfast Swartz, Zachary T, MD   325 mg at 12/15/23 0757   guaiFENesin -dextromethorphan (ROBITUSSIN DM) 100-10 MG/5ML syrup 10 mL  10 mL Oral Q6H PRN Street, [place], PA-C       melatonin tablet 5 mg  5 mg Oral QHS PRN Street, [place], PA-C       multivitamin with minerals tablet 1 tablet  1 tablet Oral Daily [address] [place], PA-C   1 tablet at 12/15/23 0756   polyethylene glycol (MIRALAX  / GLYCOLAX ) packet 17 g  17 g Oral Daily PRN Street, [place], PA-C   17 g at 12/14/23 1248   potassium chloride  SA (KLOR-CON  M) CR tablet 20 mEq  20 mEq Oral Daily Swartz, Zachary T, MD   20 mEq at 12/15/23 0756   prochlorperazine  (COMPAZINE ) tablet 5-10 mg  5-10 mg Oral Q6H PRN Street, [place], PA-C       sodium chloride  flush (NS) 0.9 % injection 10-40 mL  10-40 mL Intracatheter  Q12H Raulkar, Sven SQUIBB, MD   10 mL at 12/15/23 0757   sodium chloride  flush (NS) 0.9 % injection 10-40 mL  10-40 mL Intracatheter PRN Raulkar, Sven SQUIBB, MD       Warfarin - Pharmacist Dosing Inpatient   Does not apply q1600 Cyndy Ozell DASEN, [facility]   Given at 12/14/23 1713    Allergies as of 12/02/2023 - Review Complete 11/29/2023  Allergen Reaction Noted   Bee venom Anaphylaxis 01/11/2023    Family History  Problem Relation Age of Onset   Heart failure Mother    Pleurisy Mother    Other Father        UNK MEDICAL HX    Social History   Socioeconomic History   Marital status: Married    Spouse  name: [name]   Number of children: 0   Years of education: 12 + 2   Highest education level: Not on file  Occupational History   Occupation: RETIRED CONSTRUCTION  Tobacco Use   Smoking status: Never   Smokeless tobacco: Never  Vaping Use   Vaping status: Never Used  Substance and Sexual Activity   Alcohol use: Yes    Alcohol/week: 14.0 standard drinks of alcohol    Types: 10 Cans of beer, 1 Shots of liquor, 3 Glasses of wine per week   Drug use: No   Sexual activity: Not Currently  Other Topics Concern   Not on file  Social History Narrative   Not on file   Social Drivers of Health   Financial Resource Strain: Not on file  Food Insecurity: No Food Insecurity (11/30/2023)   Hunger Vital Sign    Worried About Running Out of Food in the Last Year: Never true    Ran Out of Food in the Last Year: Never true  Transportation Needs: No Transportation Needs (11/30/2023)   PRAPARE - Administrator, Civil Service (Medical): No    Lack of Transportation (Non-Medical): No  Physical Activity: Not on file  Stress: Not on file  Social Connections: Moderately Integrated (11/30/2023)   Social Connection and Isolation Panel    Frequency of Communication with Friends and Family: Twice a week    Frequency of Social Gatherings with Friends and Family: Twice a week    Attends  Religious Services: 1 to 4 times per year    Active Member of [employer] or Organizations: No    Attends Banker Meetings: Never    Marital Status: Married  Catering manager Violence: Unknown (11/30/2023)   Humiliation, Afraid, Rape, and Kick questionnaire    Fear of Current or Ex-Partner: No    Emotionally Abused: No    Physically Abused: Not on file    Sexually Abused: No    Review of Systems: Pertinent positive and negative review of systems were noted in the above HPI section.  All other review of systems was otherwise negative.   Physical Exam: Vital signs in last 24 hours: Temp:  [97.5 F (36.4 C)-98 F (36.7 C)] 98 F (36.7 C) (08/28 1456) Pulse Rate:  [93-105] 93 (08/28 1456) Resp:  [16-19] 16 (08/28 1456) BP: (118-146)/(60-73) 118/60 (08/28 1456) SpO2:  [96 %-100 %] 100 % (08/28 1456) Last BM Date : 12/14/23 General:   Alert,  Well-developed, well-nourished, chronically ill-appearing elderly white male pleasant and cooperative in NAD, sitting up in chair, bilateral nephrostomy tubes Head:  Normocephalic and atraumatic. Eyes:  Sclera clear, no icterus.   Conjunctiva pink. Ears:  Normal auditory acuity. Nose:  No deformity, discharge,  or lesions. Mouth:  No deformity or lesions.   Neck:  Supple; no masses or thyromegaly. Lungs:  Clear throughout to auscultation.   No wheezes, crackles, or rhonchi.  Heart:  Regular rate and rhythm; no murmurs, clicks, rubs,  or gallops. Abdomen:  Soft, obese, nontender, BS active,nonpalp mass or hsm.  Small umbilical hernia Rectal: Not done, documented heme positive Msk:  Symmetrical without gross deformities. . Pulses:  Normal pulses noted. Extremities:  Without clubbing or edema.  Bilateral nephrostomy tubes with leg bags Neurologic:  Alert and  oriented x4; mentation somewhat slow but appropriate Skin:  Intact without significant lesions or rashes.. Psych:  Alert and cooperative. Normal mood and affect.  Intake/Output from  previous day: 08/27 0701 - 08/28 0700 In:  716 [P.O.:716] Out: 1600 [Urine:1600] Intake/Output this shift: Total I/O In: 722 [P.O.:712; I.V.:10] Out: 1125 [Urine:1125]  Lab Results: Recent Labs    12/15/23 0330  WBC 5.7  HGB 7.1*  HCT 22.2*  PLT 72*   BMET Recent Labs    12/15/23 0330  NA 139  K 3.6  CL 108  CO2 22  GLUCOSE 104*  BUN 19  CREATININE 1.19  CALCIUM  8.1*   LFT No results for input(s): PROT, ALBUMIN , AST, ALT, ALKPHOS, BILITOT, BILIDIR, IBILI in the last 72 hours. PT/INR Recent Labs    12/14/23 0433 12/15/23 0330  LABPROT 21.8* 23.4*  INR 1.8* 2.0*   Hepatitis Panel No results for input(s): HEPBSAG, HCVAB, HEPAIGM, HEPBIGM in the last 72 hours.    IMPRESSION:  #23  73 year old white male with hypercoagulable state in setting of stage IV bladder cancer status post bilateral nephrostomies who was on chronic Eliquis  when he presented with acute CVA 11/20/2023 and required emergent thrombectomy  He was resumed on Eliquis , transferred to rehab and unfortunately had another acute CVA on 11/29/2023 requiring repeat thrombectomy.  He has now been transitioned to Coumadin .  #2 anemia chronic with hemoglobin of 9 at baseline on admission.  He has required a couple of units of blood since initial admission felt in part secondary to thrombectomies  Episode of supratherapeutic INR on 12/05/2019 with INR of 7 hemoglobin 8.3 at that time He has drifted down to 7.1 today/INR 2.0 and stool documented Hemoccult positive for the first time today.  Previous Hemoccults earlier this admission negative.  I think his anemia is multifactorial, and setting of anticoagulation, thrombectomies, metastatic bladder cancer. With heme positive stool, in setting of acute illness will rule out peptic ulcer disease, gastropathy, esophagitis.  #3 history of prostate cancer #4.  Previous history of DVT/PE #5 history of B12 deficiency, and chronic  thrombocytopenia-followed by hematology/[facility]   PLAN: Start Protonix  40 mg daily Keep n.p.o. after midnight We have discussed diagnostic EGD with the patient with Coumadin  on board.  He is agreeable to proceed.  The procedure was discussed in detail including indications risk and benefits and he agrees to proceed tomorrow. If EGD is unremarkable we will have to have further discussions about how aggressively to work this up as he needs to be anticoagulated, if has colonoscopy may need to transition to IV heparin  at [place]. Continue to trend hemoglobin transfuse as needed to keep his hemoglobin above 7 Patient asked that we discuss EGD with his wife, I will contact her by phone this evening and discuss.  I spoke with the patient's wife by phone, she is in agreement with EGD, she will be here in the morning.  Amy Esterwood PA-C 12/15/2023, 4:50 PM    ATTENDING ADDENDUM I personally saw the patient and performed a substantive portion of this encounter (>50% time spent), including a complete performance of at least one of the key components (MDM, Hx and/or Exam), in conjunction with Amy Esterwood.  73 year old male with metastatic bladder cancer, bilateral nephrostomies in place, stroke earlier this month with urgent thrombectomy, had another stroke on August 12 leading to another thrombectomy and transition to Coumadin .  He has had some baseline anemia that is likely multifactorial during his course but has had drift of hemoglobin recently on Coumadin , leading to blood transfusion.  Stool is heme positive, he does not see any overt blood in the stools and has no GI symptoms that bother him.  He is currently anticoagulated, ideally they would  not want to reverse or stop the anticoagulation given his recurrent strokes.  He has never had a prior colonoscopy or EGD.  I discussed with him how aggressive he wanted to be in regards to working up this issue.  Does sound like multifactorial anemia but he  could be having some occult blood loss in the setting of anticoagulation.  I offered him an EGD initially to clear his upper tract as this would be much easier for him without prep.  Following discussion of risks and benefits of EGD and anesthesia he wants to proceed.  If we find clear pathology to cause his anemia on his exam then we will address it.  If the exam is normal, we will then discussed with him if he wanted to pursue colonoscopy or not, and discussed with team if we would want to transition to heparin  drip for that or not.  For now, we will start with EGD, tentatively scheduled for tomorrow.  N.p.o. after midnight.  Call us  in the interim with any questions or concerns.  Starting empiric PPI otherwise.  Marcey Naval, MD [facility]

## 2023-12-16 ENCOUNTER — Encounter (HOSPITAL_COMMUNITY)
Admission: AD | Disposition: A | Discharge status: Home Health | Payer: Self-pay | Source: Intra-hospital | Attending: Physical Medicine & Rehabilitation

## 2023-12-16 ENCOUNTER — Inpatient Hospital Stay (HOSPITAL_COMMUNITY): Admitting: Anesthesiology

## 2023-12-16 DIAGNOSIS — I10 Essential (primary) hypertension: Secondary | ICD-10-CM

## 2023-12-16 DIAGNOSIS — K317 Polyp of stomach and duodenum: Secondary | ICD-10-CM

## 2023-12-16 DIAGNOSIS — K31819 Angiodysplasia of stomach and duodenum without bleeding: Secondary | ICD-10-CM

## 2023-12-16 DIAGNOSIS — K449 Diaphragmatic hernia without obstruction or gangrene: Secondary | ICD-10-CM

## 2023-12-16 DIAGNOSIS — K2289 Other specified disease of esophagus: Secondary | ICD-10-CM

## 2023-12-16 DIAGNOSIS — K3189 Other diseases of stomach and duodenum: Secondary | ICD-10-CM

## 2023-12-16 DIAGNOSIS — D649 Anemia, unspecified: Secondary | ICD-10-CM

## 2023-12-16 HISTORY — PX: ESOPHAGOGASTRODUODENOSCOPY: SHX5428

## 2023-12-16 LAB — BPAM RBC
Blood Product Expiration Date: 202509212359
ISSUE DATE / TIME: 202508281429
Unit Type and Rh: 6200

## 2023-12-16 LAB — TYPE AND SCREEN
ABO/RH(D): A POS
Antibody Screen: NEGATIVE
Unit division: 0

## 2023-12-16 LAB — CBC
HCT: 24.9 % — ABNORMAL LOW (ref 39.0–52.0)
Hemoglobin: 8.1 g/dL — ABNORMAL LOW (ref 13.0–17.0)
MCH: 30.2 pg (ref 26.0–34.0)
MCHC: 32.5 g/dL (ref 30.0–36.0)
MCV: 92.9 fL (ref 80.0–100.0)
Platelets: 74 K/uL — ABNORMAL LOW (ref 150–400)
RBC: 2.68 MIL/uL — ABNORMAL LOW (ref 4.22–5.81)
RDW: 20.6 % — ABNORMAL HIGH (ref 11.5–15.5)
WBC: 5.8 K/uL (ref 4.0–10.5)
nRBC: 0.3 % — ABNORMAL HIGH (ref 0.0–0.2)

## 2023-12-16 LAB — PROTIME-INR
INR: 2 — ABNORMAL HIGH (ref 0.8–1.2)
Prothrombin Time: 24 s — ABNORMAL HIGH (ref 11.4–15.2)

## 2023-12-16 SURGERY — EGD (ESOPHAGOGASTRODUODENOSCOPY)
Anesthesia: Monitor Anesthesia Care

## 2023-12-16 MED ORDER — SIMETHICONE 80 MG PO CHEW
240.0000 mg | CHEWABLE_TABLET | Freq: Once | ORAL | Status: AC
  Administered 2023-12-16: 240 mg via ORAL
  Filled 2023-12-16: qty 3

## 2023-12-16 MED ORDER — GLYCOPYRROLATE PF 0.2 MG/ML IJ SOSY
PREFILLED_SYRINGE | INTRAMUSCULAR | Status: DC | PRN
  Administered 2023-12-16: .2 mg via INTRAVENOUS

## 2023-12-16 MED ORDER — LIDOCAINE 2% (20 MG/ML) 5 ML SYRINGE
INTRAMUSCULAR | Status: DC | PRN
  Administered 2023-12-16 (×2): 50 mg via INTRAVENOUS

## 2023-12-16 MED ORDER — BISACODYL 5 MG PO TBEC
10.0000 mg | DELAYED_RELEASE_TABLET | Freq: Once | ORAL | Status: AC
  Administered 2023-12-16: 10 mg via ORAL
  Filled 2023-12-16: qty 2

## 2023-12-16 MED ORDER — POLYETHYLENE GLYCOL 3350 17 GM/SCOOP PO POWD
119.0000 g | Freq: Once | ORAL | Status: AC
  Administered 2023-12-16: 119 g via ORAL
  Filled 2023-12-16: qty 119

## 2023-12-16 MED ORDER — PROPOFOL 10 MG/ML IV BOLUS
INTRAVENOUS | Status: DC | PRN
  Administered 2023-12-16: 20 mg via INTRAVENOUS
  Administered 2023-12-16: 40 mg via INTRAVENOUS

## 2023-12-16 MED ORDER — PROPOFOL 500 MG/50ML IV EMUL
INTRAVENOUS | Status: DC | PRN
  Administered 2023-12-16: 150 ug/kg/min via INTRAVENOUS

## 2023-12-16 MED ORDER — WARFARIN SODIUM 3 MG PO TABS
3.0000 mg | ORAL_TABLET | Freq: Once | ORAL | Status: DC
  Filled 2023-12-16: qty 1

## 2023-12-16 MED ORDER — SODIUM CHLORIDE 0.9 % IV SOLN: INTRAVENOUS | Status: DC | PRN

## 2023-12-16 MED ORDER — SODIUM CHLORIDE 0.9 % IV SOLN: INTRAVENOUS | Status: AC

## 2023-12-16 MED ORDER — POLYETHYLENE GLYCOL 3350 17 GM/SCOOP PO POWD
238.0000 g | Freq: Once | ORAL | Status: AC
  Administered 2023-12-16: 238 g via ORAL
  Filled 2023-12-16: qty 238

## 2023-12-16 MED ORDER — WARFARIN SODIUM 2.5 MG PO TABS
2.5000 mg | ORAL_TABLET | Freq: Once | ORAL | Status: AC
  Administered 2023-12-16: 2.5 mg via ORAL
  Filled 2023-12-16: qty 1

## 2023-12-16 NOTE — Progress Notes (Signed)
 Physical Therapy Session Note  Patient Details  Name: Allen Macdonald MRN: [id number] Date of Birth: Sep 08, 1950  Today's Date: 12/16/2023 PT Individual Time: 1350-1508 PT Individual Time Calculation (min): 78 min   Short Term Goals: Week 2:  PT Short Term Goal 1 (Week 2): = LTGs  SESSION 1 Skilled Therapeutic Interventions/Progress Updates: Pt missed 45 min of skilled therapy due to reports of fatigue and needing to eat late breakfast as pt was NPO for procedure earlier in morning. PTA attempted to make up minutes at end of afternoon session but pt politely declined due to fatigue. Will re-attempt as schedule and pt availability permits.  SESSION 2 Skilled Therapeutic Interventions/Progress Updates: Patient sitting in recliner with wife present and nsg providing medication on entrance to room. Patient alert and agreeable to PT session.   Patient reported no pain during session. Pt d/c date pushed to 9/1 (potentially) to have procedure done over the weekend. Discussed with pt about HEP below. PTA, pt and pt wife further discussed any final concerns or questions regarding pt's safe d/c home regarding use of RW during car and stair transfers, as well as to avoid over exertion of performing both OT and PT HEP.   Therapeutic Activity: Transfers: Pt performed sit<>stand transfers throughout session with RW and with modI.  Pt required increased time/effort to donn personal compression socks and was able to do so without assistance. Pt able to place LE in figure 4 position with B UE assisting! Pt ambulated from room<>main gym in RW with PTA navigating IV pole. Pt performed with no reports of SOB and with VC to maintain upright standing posture.   Access Code: [email or web address] URL: [email or web address] Date: 12/16/2023 Prepared by: Delinda Bertrand  Exercises - Sit to Stand with Arms Crossed  - 1 x daily - 7 x weekly - 3 sets - 10 reps - Seated March with Resistance  - 1 x daily - 7 x  weekly - 3 sets - 10 reps - Heel Raises with Counter Support  - 1 x daily - 7 x weekly - 3 sets - 10 reps - Seated Hip Abduction with Resistance  - 1 x daily - 7 x weekly - 3 sets - 10 reps  Therapeutic Exercise: Pt performed the following exercises with therapist providing the described cuing and facilitation for improvement. - 2 x 10 sit<>stand with arms crossed from lowered hi/low mat with VC of breathing sequence (inhale prior to standing through nostrils, and exhaling through mouth when standing).  - x 10 clam shells sitting edge of mat with pt wife assisting with donning theraband (education on placing band under neph tubing on distal thigh).  - x 10 LAQ sitting edge of mat with pt wife assisting with donning theraband  - x 10 standing heel raises B UE support with VC for controlled eccentric  Patient sitting in recliner at end of session with brakes locked, wife present, and all needs within reach.      Therapy Documentation Precautions:  Precautions Precautions: Fall Recall of Precautions/Restrictions: Intact Precaution/Restrictions Comments: bilat nephrostomy bags, fall, aphasia, mild R hemi Restrictions Weight Bearing Restrictions Per Provider Order: No  Therapy/Group: Individual Therapy  Lateria Alderman PTA 12/16/2023, 3:25 PM

## 2023-12-16 NOTE — Progress Notes (Addendum)
 Speech Language Pathology Discharge Summary  Patient Details  Name: Allen Macdonald MRN: [id number] Date of Birth: 11/06/1950  Date of Discharge from SLP service: September 1st, 2025   Patient has met 4 of 4 long term goals.  Patient to discharge at overall Modified Independent;Supervision level.  Reasons goals not met: n/a   Clinical Impression/Discharge Summary:  Pt has made excellent gains and has met 4 of 4 LTG's this admission due to improved communication and cognition. Pt is currently an overall mod i for simple cognitive tasks and requires supervision cues for expressive and receptive communication.   Pt/family education complete and pt will discharge home with supervision from friends/family/etc. Pt would benefit from f/u ST services to maximize communication in order to maximize functional independence.   Care Partner:  Caregiver Able to Provide Assistance: Yes  Type of Caregiver Assistance: Cognitive;Physical  Recommendation:  Outpatient SLP;Home Health SLP  Rationale for SLP Follow Up: Maximize functional communication   Equipment: n/a   Reasons for discharge: Treatment goals met;Discharged from hospital   Patient/Family Agrees with Progress Made and Goals Achieved: Yes   Ernie Sagrero M.A., CCC-SLP 12/19/2023 10:04 AM

## 2023-12-16 NOTE — Progress Notes (Signed)
 SLP Cancellation Note  Patient Details Name: Andriel Omalley MRN: [id number] DOB: 10/05/1950   Cancelled treatment:        SLP attempted to visit patient for skilled ST this date, however patient off the unit for procedure. 45 minutes of ST missed. SLP will attempt to return as patient/therapist is able.                                                                                                 Olivia Royse M.A., CCC-SLP 12/16/2023, 2:00 PM

## 2023-12-16 NOTE — Op Note (Addendum)
 [facility] Patient Name: Allen Macdonald Procedure Date : 12/16/2023 MRN: [id number] Attending MD: Elspeth SQUIBB. Leigh , MD, [id number] Date of Birth: [birth year] CSN: [id number] Age: 73 Admit Type: Inpatient Procedure:                Upper GI endoscopy Indications:              Heme positive stool - anemia - normocytic in the                            setting of metastatic bladder cancer, ON coumadin                             INR in 2s, recent CVA x 2 - cannot stop                            anticoagulation Providers:                Elspeth P. Leigh, MD, Darleene Bare, RN, Lorrayne Kitty, Technician Referring MD:              Medicines:                Monitored Anesthesia Care Complications:            No immediate complications. Estimated blood loss:                            Minimal. Estimated Blood Loss:     Estimated blood loss was minimal. Procedure:                Pre-Anesthesia Assessment:                           - Prior to the procedure, a History and Physical                            was performed, and patient medications and                            allergies were reviewed. The patient's tolerance of                            previous anesthesia was also reviewed. The risks                            and benefits of the procedure and the sedation                            options and risks were discussed with the patient.                            All questions were answered, and informed consent  was obtained. Prior Anticoagulants: The patient has                            taken Coumadin  (warfarin), last dose was day of                            procedure. ASA Grade Assessment: IV - A patient                            with severe systemic disease that is a constant                            threat to life. After reviewing the risks and                            benefits, the patient was  deemed in satisfactory                            condition to undergo the procedure.                           After obtaining informed consent, the endoscope was                            passed under direct vision. Throughout the                            procedure, the patient's blood pressure, pulse, and                            oxygen saturations were monitored continuously. The                            [id number]-H190 ([id number]) Olympus endoscope was introduced                            through the mouth, and advanced to the second part                            of duodenum. The upper GI endoscopy was                            accomplished without difficulty. The patient                            tolerated the procedure well. Scope In: Scope Out: Findings:      The Z-line was slightly irregular but did not meet criteria for       Barrett's.      A 2 cm hiatal hernia was present.      The exam of the esophagus was otherwise normal.      A single 6 mm sessile polyp with no stigmata of recent bleeding was       found in the cardia. This was not ulcerated, had no  stigmata for       bleeding, appears to grossly be an inflammatory polyp. Not biopsied or       removed as he is on coumadin .      Patchy mildly erythematous mucosa was found in the gastric fundus.      A single diminutive angiodysplastic lesion with no bleeding was found in       the gastric body. Fulguration to ablate the lesion to prevent bleeding       by argon plasma was successful.      The exam of the stomach was otherwise normal.      Patchy mildly erythematous mucosa and with no stigmata of bleeding was       found in the duodenal bulb.      The exam of the duodenum was otherwise normal. Impression:               - Z-line slightly irregular but does not meet                            criteria for Barrett's.                           - 2 cm hiatal hernia.                           - A single gastric polyp in  the cardia - I suspect                            benign inflammatory polyp - not removed or biopsied                            today as above.                           - Erythematous mucosa in the gastric fundus.                           - A single non-bleeding angiodysplastic lesion in                            the stomach. Treated with argon plasma coagulation                            (APC).                           - Erythematous duodenopathy.                           - Normal exam otherwise.                           Overall, I doubt diminutive gastric AVM / gastric                            polyp are the source of his anemia. It remains  unclear if GI tract loss is even driving this                            anemia (normocytic), prior iron studies c/w that of                            anemia of chronic disease, but he does have heme                            positive stool in the setting of Coumadin  and has                            never had a colonoscopy. Will discuss with him how                            aggressive he wishes to be with further workup.                            Next step would normally be colonoscopy. He can't                            stop coumadin , could do a diagnostic exam on                            coumadin  or transition to heparin  drip and do it on                            heparin  in case lesions needed to be removed.                            Alternatively could just monitor for now if he did                            not want to have a colonoscopy, in light of his                            comorbidities Recommendation:           - Return patient to hospital ward for ongoing care.                           - Diet pending discussion on how he wishes to                            proceed. If he wants to have a colonoscopy would                            keep on clear liquids and prep tonight for                             procedure tomorrow                           -  Continue present medications including coumadin                            - Gastric cardia polyp again I think unlikely                            playing a role here but not biopsied / removed. I                            think yield of doing so in light of his                            comorbidities is likely low in the future but can                            see us  in the office after discharge to discuss it                            further.                           ADDENDUM: spoke with patient about this situation                            at length. He has never had a prior colonoscopy. He                            is not comfortable just monitoring for now. He                            wants to proceed with colonoscopy ON coumadin ,                            understanding we can't remove polyps but can rule                            out malignancy and evaluate for other sources. Plan                            on clear liquid diet today, prep today / tonight.                            Adding half MIralax  prep today as he has some                            constipation, ensure prepped for tomorrow.                            Procedure will be scheduled with Dr. Rollin tomorrow                            who will be covering the GI service this weekend. Procedure Code(s):        ---  Professional ---                           [phone], Esophagogastroduodenoscopy, flexible,                            transoral; with control of bleeding, any method Diagnosis Code(s):        --- Professional ---                           K22.89, Other specified disease of esophagus                           K44.9, Diaphragmatic hernia without obstruction or                            gangrene                           K31.7, Polyp of stomach and duodenum                           K31.89, Other diseases of stomach and duodenum                            K31.819, Angiodysplasia of stomach and duodenum                            without bleeding                           R19.5, Other fecal abnormalities CPT copyright 2022 American [employer] Association. All rights reserved. The codes documented in this report are preliminary and upon coder review may  be revised to meet current compliance requirements. Elspeth P. Leigh, MD 12/16/2023 9:53:46 AM This report has been signed electronically. Number of Addenda: 0

## 2023-12-16 NOTE — Interval H&P Note (Signed)
 History and Physical Interval Note: Here for EGD today - INR 2.0 on coumadin , will clear upper tract for source of anemia / heme positive stool. I have discussed risks / benefits of the exam and anesthesia and he wishes to proceed. All questions answered. He denies any interval changes since I have seen him, feels well this AM and wishes to proceed.   12/16/2023 9:00 AM  Allen Macdonald  has presented today for surgery, with the diagnosis of Anemia, heme positive stool in setting of Coumadin .  The various methods of treatment have been discussed with the patient and family. After consideration of risks, benefits and other options for treatment, the patient has consented to  Procedure(s): EGD (ESOPHAGOGASTRODUODENOSCOPY) (N/A) as a surgical intervention.  The patient's history has been reviewed, patient examined, no change in status, stable for surgery.  I have reviewed the patient's chart and labs.  Questions were answered to the patient's satisfaction.     Elspeth P Demaurion Dicioccio

## 2023-12-16 NOTE — Progress Notes (Signed)
 Patient ID: Allen Macdonald, male   DOB: 1950/10/18, 73 y.o.   MRN: [id number]  According to MD pt is not discharging tomorrow due to medical issues. Have let [facility] know of delay in discharge.

## 2023-12-16 NOTE — Plan of Care (Signed)
  Problem: RH Comprehension Communication Goal: LTG Patient will comprehend basic/complex auditory (SLP) Description: LTG: Patient will comprehend basic/complex auditory information with cues (SLP). Outcome: Completed/Met   Problem: RH Expression Communication Goal: LTG Patient will verbally express basic/complex needs(SLP) Description: LTG:  Patient will verbally express basic/complex needs, wants or ideas with cues  (SLP) Outcome: Completed/Met Goal: LTG Patient will increase word finding of common (SLP) Description: LTG:  Patient will increase word finding of common objects/daily info/abstract thoughts with cues using compensatory strategies (SLP). Outcome: Completed/Met   Problem: RH Memory Goal: LTG Patient will use memory compensatory aids to (SLP) Description: LTG:  Patient will use memory compensatory aids to recall biographical/new, daily complex information with cues (SLP) Outcome: Completed/Met

## 2023-12-16 NOTE — Progress Notes (Signed)
 Patient undergoing bowel prep and tolerating well. Finished the first round of Miralax  around 1530 and had a large result at 1815. Started the second round of Miralax  at 1830.  Consent form signed by pt, placed in shadow chart.   Allen Macdonald W Ryland Tungate

## 2023-12-16 NOTE — Progress Notes (Signed)
 PHARMACY - ANTICOAGULATION CONSULT NOTE  Pharmacy Consult: Warfarin Indication: History of VTE with recurrent CVAs  Allergies  Allergen Reactions   Bee Venom Anaphylaxis    Patient Measurements: Height: 5' 10 (177.8 cm) Weight: 104.9 kg (231 lb 4.2 oz) IBW/kg (Calculated) : 73  Vital Signs: Temp: 97.9 F (36.6 C) (08/29 0840) Temp Source: Temporal (08/29 0840) BP: 135/66 (08/29 0840) Pulse Rate: 87 (08/29 0840)  Labs: Recent Labs    12/14/23 0433 12/15/23 0330 12/16/23 0400  HGB  --  7.1* 8.1*  HCT  --  22.2* 24.9*  PLT  --  72* 74*  LABPROT 21.8* 23.4* 24.0*  INR 1.8* 2.0* 2.0*  CREATININE  --  1.19  --     Estimated Creatinine Clearance: 65.1 mL/min (by C-G formula based on SCr of 1.19 mg/dL).  Assessment: 17 YOM with 2 recent L MCA strokes s/p MT each time.  Patient went to Rehab and was receiving Eliquis , last dose on 8/12 at 0847.  Patient returned to the inpatient side with recurrent CVA on 11/29/23.  MRI shows petechial hemorrhage with new/increased scattered punctate infarcts and new microhemorrhages.   Pharmacy consulted to dose warfarin. Stopped Eliquis .  8/21 PRBC x 1  INR is therapeutic at 2 again this morning. Pt consistently eating ~75-100% of meals. Patient had EGD today, may plan for colonoscopy. GI note says to continue warfarin for now.    Goal of Therapy:  INR 2 to 3 Monitor platelets by anticoagulation protocol: Yes  Plan:  Warfarin 2.5 mg PO x1  Check INR daily while on warfarin CBC Mon/Thurs  Monitor clinical course, s/sx of bleed, PO intake/diet, Drug-Drug Interactions   Thank you for allowing pharmacy to be a part of this patient's care.   Bascom JAYSON Louder, PharmD 12/16/2023 9:19 AM  **Pharmacist phone directory can be found on [email or web address] listed under [facility]**

## 2023-12-16 NOTE — Anesthesia Postprocedure Evaluation (Signed)
 Anesthesia Post Note  Patient: Allen Macdonald  Procedure(s) Performed: EGD (ESOPHAGOGASTRODUODENOSCOPY)     Patient location during evaluation: PACU Anesthesia Type: MAC Level of consciousness: awake and alert Pain management: pain level controlled Vital Signs Assessment: post-procedure vital signs reviewed and stable Respiratory status: spontaneous breathing, nonlabored ventilation, respiratory function stable and patient connected to nasal cannula oxygen Cardiovascular status: stable and blood pressure returned to baseline Postop Assessment: no apparent nausea or vomiting Anesthetic complications: no   No notable events documented.  Last Vitals:  Vitals:   12/16/23 1010 12/16/23 1032  BP: 128/66 131/68  Pulse: 83 88  Resp: 19 18  Temp:  36.6 C  SpO2: 97% 100%    Last Pain:  Vitals:   12/16/23 1032  TempSrc: Oral  PainSc:                  Lynwood MARLA Cornea

## 2023-12-16 NOTE — Progress Notes (Signed)
 Today's Date: 12/16/2023 OT Missed Time: 45 Minutes Missed Time Reason: Out of hospital appointment (EDG)  Pt off the unit for procedure. Missed 45 min of care. Will attempt to see at a later time as time allows.

## 2023-12-16 NOTE — Progress Notes (Signed)
 PROGRESS NOTE   Subjective/Complaints:  Pt about to go down for EGD when I saw him. Feeling ok. Just a little tired. Received blood yesterday  ROS: Patient denies fever, rash, sore throat, blurred vision, dizziness, nausea, vomiting, diarrhea, cough, shortness of breath or chest pain, joint or back/neck pain, headache, or mood change.      Objective:   No results found. Recent Labs    12/15/23 0330 12/16/23 0400  WBC 5.7 5.8  HGB 7.1* 8.1*  HCT 22.2* 24.9*  PLT 72* 74*   Recent Labs    12/15/23 0330  NA 139  K 3.6  CL 108  CO2 22  GLUCOSE 104*  BUN 19  CREATININE 1.19  CALCIUM  8.1*         Intake/Output Summary (Last 24 hours) at 12/16/2023 1008 Last data filed at 12/16/2023 0931 Gross per 24 hour  Intake 1273.37 ml  Output 1800 ml  Net -526.63 ml        Physical Exam: Vital Signs Blood pressure 116/64, pulse 84, temperature (!) 97.2 F (36.2 C), temperature source Temporal, resp. rate (!) 22, height 5' 10 (1.778 m), weight 104.9 kg, SpO2 96%.  Constitutional: No distress . Vital signs reviewed. HEENT: NCAT, EOMI, oral membranes moist Neck: supple Cardiovascular: RRR without murmur. No JVD    Respiratory/Chest: CTA Bilaterally without wheezes or rales. Normal effort    GI/Abdomen: BS +, non-tender, non-distended Ext: no clubbing, cyanosis, or edema Psych: pleasant and cooperative  Uro: nephrostomy tubes in place with yellow urine Skin: intact over exposed surfaces. bruises Neuro: Alert and oriented x2, right sided strength is 4/5, otherwise strength is 5/5. Sensory exam normal for light touch and pain in all 4 limbs. No limb ataxia or cerebellar signs. No abnormal tone appreciated.  Still with non-fluent speech, takes his time. Prior neuro assessment is c/w 12/16/2023 exam.   Assessment/Plan: 1. Functional deficits which require 3+ hours per day of interdisciplinary therapy in a comprehensive  inpatient rehab setting. Physiatrist is providing close team supervision and 24 hour management of active medical problems listed below. Physiatrist and rehab team continue to assess barriers to discharge/monitor patient progress toward functional and medical goals  Care Tool:  Bathing    Body parts bathed by patient: Right arm, Left arm, Chest, Abdomen, Right upper leg, Left upper leg, Front perineal area, Face   Body parts bathed by helper: Buttocks, Right lower leg, Left lower leg     Bathing assist Assist Level: Minimal Assistance - Patient > 75%     Upper Body Dressing/Undressing Upper body dressing   What is the patient wearing?: Pull over shirt    Upper body assist Assist Level: Independent    Lower Body Dressing/Undressing Lower body dressing      What is the patient wearing?: Pants, Incontinence brief     Lower body assist Assist for lower body dressing: Supervision/Verbal cueing     Toileting Toileting    Toileting assist Assist for toileting: Contact Guard/Touching assist     Transfers Chair/bed transfer  Transfers assist  Chair/bed transfer activity did not occur: Safety/medical concerns  Chair/bed transfer assist level: Supervision/Verbal cueing     Locomotion Ambulation  Ambulation assist      Assist level: Supervision/Verbal cueing Assistive device: Walker-rolling Max distance: 150+   Walk 10 feet activity   Assist     Assist level: Supervision/Verbal cueing Assistive device: Walker-rolling   Walk 50 feet activity   Assist    Assist level: Supervision/Verbal cueing Assistive device: Walker-rolling    Walk 150 feet activity   Assist Walk 150 feet activity did not occur: Safety/medical concerns (fatigue)  Assist level: Supervision/Verbal cueing Assistive device: Walker-rolling    Walk 10 feet on uneven surface  activity   Assist Walk 10 feet on uneven surfaces activity did not occur: Safety/medical concerns  (fatigue)   Assist level: Supervision/Verbal cueing Assistive device: Walker-rolling   Wheelchair     Assist Is the patient using a wheelchair?: Yes Type of Wheelchair: Manual    Wheelchair assist level: Dependent - Patient 0%      Wheelchair 50 feet with 2 turns activity    Assist        Assist Level: Dependent - Patient 0%   Wheelchair 150 feet activity     Assist      Assist Level: Dependent - Patient 0%   Blood pressure 116/64, pulse 84, temperature (!) 97.2 F (36.2 C), temperature source Temporal, resp. rate (!) 22, height 5' 10 (1.778 m), weight 104.9 kg, SpO2 96%.  Medical Problem List and Plan: 1. Functional deficits secondary to left MCA scattered infarcts with left ICA and left MCA occlusion s/p IR with TICI2c followed by L ICA and basilar tip emboli s/p mechanical thrombectomy TIC3, likely due to hypercoagulable state in setting of cancer             -patient may shower, cover incision/nephrostomy tubes             -ELOS/Goals: 12/17/23.  PT/OT sup to min A, SLP supervision              -Continue CIR therapies including PT, OT, and SLP **dc tomorrow may be delayed pending findings of EGD**  2.  Antithrombotics: -DVT: hx of multiple BL DVT and PE had IVC filter placed 2014  -US  LE positive for age indeterminate DVTs: RT - PopV and PTV LT - CFV, PFV, FV, PopV, Gastrocs  -Probable hypercoagulable state r/t carcinoma  -Pharmaceutical: previously Eliquis  5mg  BID,  heparin  drip transitioned to   coumadin    -appreciate pharmacy monitoring of INR--therapeutic             -antiplatelet therapy: N/A 3. Pain Management: Tylenol  prn  4. Mood/Behavior/Sleep: LCSW to follow for evaluation and support when available.              -antipsychotic agents: N/A  -melatonin PRN 5. Neuropsych/cognition: This patient may be capable of making decisions on his own behalf. 6. Skin/Wound Care: routine pressure relief measures    7. Fluids/Electrolytes/Nutrition:  Monitor I/O  - Per speech past swallow study continue regular diet w/ Ensure and multivitamin supplements-- reordered from prior admission orders   -encouraging po 8/28 K+ 3.6, continue daily K+ supp 8.  Bladder Cancer s/p nephrostomy tubes: stage IV, metastatic to the bone by MRI of the spine on 11/08/2023.  -Hx of prostate CA, on androgen therapy- leupron q6mos-- follows with [facility] -prior UTI: Ceftriaxone  1g daily completed 8/10   9. AKI: Monitor BMP w/GFR  Cr 1.22--1.42--1.52>> 1.16 on 8/13.   - 8/18 Cr 1.3 today, ?baseline renal insufficiency?  -encourage fluids -12/10/23 Cr 1.19 yesterday, monitor 10.  Anemia: Hemoglobin stable  post PRBC 2 units Hgb 9.9-- downtrended during readmission, 9.0--8.5--8.5--7.7--7.6 but up to 9.2 on 8/17.  -12/05/23 Hgb 8.3, FOBT neg. 8/25 hgb essentially holding at 7.9 today  -received 1u prbc on rehab so far -anemia is normocytic. Suspect he has pancytopenia d/t bladder ca. Could be some GI loss related to previously supratherapeutic INR   -avoid supratherapeutic INR's   -recheck stool x 1 (have not received a second sample)   -continue multivitamin,  b complex and add ferrous sulfate   8/29---hgb down to 7.1 yesterday--stool sample + for OB   -pt down for EGD right now. Appreciate GI help   -protonix  on board   -hgb up to 8.1 after 1u PRBC yesterday   -I also gave him a dose of aranesp  yesterday as his anemia is likely more than just GI blood loss given cancer dx 11. HTN now with hypotension: hx of orthostasis which resolved after IV fluids--now holding home amlodipine  5 mg daily and Carvdilol 12.5 mg - resume as needed. Avoid hypotension -8/29 blood pressures reasonable while still holding meds, no changes   Vitals:   12/14/23 0402 12/14/23 1316 12/14/23 1936 12/15/23 0518  BP: (!) 140/67 132/60 (!) 146/70 128/73   12/15/23 1321 12/15/23 1431 12/15/23 1456 12/15/23 1800  BP: 130/61 133/64 118/60 120/65   12/15/23 2012 12/16/23 0628 12/16/23  0840 12/16/23 0950  BP: 135/70 (!) 119/59 135/66 116/64      13. HLD: LDL 112, goal < 70, atorvastatin  80mg  daily   14. Obesity: Educate on diet and weight loss to promote overall health and mobility.  Body mass index is 33.06 kg/m.   15. Hx of laryngeal cancer s/p radiation therapy, voice sounds mildly hoarse  16. GERD: reordered pepcid  20mg  daily PRN since it appears that's how he was using it; might also warrant Protonix ? Monitor.   17. Elevated LFTs: AST 67/ALT 68, alkphos/bili WNL on 8/9. Related to Ca?  -repeat LFTs  perhaps sl improved. Follow up again 8/25    -consider changing statin    -8/25 need to add LFT's on to this morning's labs 18. Thrombocytopenia/pancytopenia:   -8/29 platelets   89k -> 72k -> 66k ->71k > 72k >74k  -likely d/t metastatic bladder cancer -monitor serially -NP spoke with oncology last week. No new recs made   -see discussions above 19 LE edema, L>R  -likely related to albumin /nutritional status. Recent EF 50-55% on ECHO   Filed Weights   12/03/23 1555 12/10/23 1555  Weight: 103.2 kg 104.9 kg    -weights fairly stable  -elevate.   compression stockings pt wore at home   LOS: 13 days A FACE TO FACE EVALUATION WAS PERFORMED  Arthea ONEIDA Gunther 12/16/2023, 10:08 AM

## 2023-12-16 NOTE — Progress Notes (Incomplete Revision)
 Speech Language Pathology Discharge Summary  Patient Details  Name: Allen Macdonald MRN: [id number] Date of Birth: 05/03/1950  Date of Discharge from SLP service:December 16, 2023   Patient has met 4 of 4 long term goals.  Patient to discharge at overall Modified Independent;Supervision level.  Reasons goals not met: n/a   Clinical Impression/Discharge Summary:  Pt has made excellent gains and has met 4 of 4 LTG's this admission due to improved communication and cognition. Pt is currently an overall mod i for simple cognitive tasks and requires supervision cues for expressive and receptive communication.   Pt/family education complete and pt will discharge home with supervision from friends/family/etc. Pt would benefit from f/u ST services to maximize communication in order to maximize functional independence.   Care Partner:  Caregiver Able to Provide Assistance: Yes  Type of Caregiver Assistance: Cognitive;Physical  Recommendation:  Outpatient SLP;Home Health SLP  Rationale for SLP Follow Up: Maximize functional communication   Equipment: n/a   Reasons for discharge: Treatment goals met;Discharged from hospital   Patient/Family Agrees with Progress Made and Goals Achieved: Yes   Cathryn Gallery M.A., CCC-SLP 12/16/2023, 10:04 AM

## 2023-12-16 NOTE — Transfer of Care (Signed)
 Immediate Anesthesia Transfer of Care Note  Patient: Allen Macdonald  Procedure(s) Performed: EGD (ESOPHAGOGASTRODUODENOSCOPY)  Patient Location: PACU  Anesthesia Type:MAC  Level of Consciousness: awake and alert   Airway & Oxygen Therapy: Patient Spontanous Breathing and Patient connected to nasal cannula oxygen  Post-op Assessment: Report given to RN and Post -op Vital signs reviewed and stable  Post vital signs: Reviewed and stable  Last Vitals:  Vitals Value Taken Time  BP 116/64 12/16/23 09:50  Temp 36.2 C 12/16/23 09:50  Pulse 84 12/16/23 09:53  Resp 20 12/16/23 09:53  SpO2 97 % 12/16/23 09:53  Vitals shown include unfiled device data.  Last Pain:  Vitals:   12/16/23 0950  TempSrc: Temporal  PainSc: 0-No pain      Patients Stated Pain Goal: 0 (12/16/23 0950)  Complications: No notable events documented.

## 2023-12-16 NOTE — Plan of Care (Signed)
  Problem: RH Comprehension Communication Goal: LTG Patient will comprehend basic/complex auditory (SLP) Description: LTG: Patient will comprehend basic/complex auditory information with cues (SLP). Flowsheets Taken 12/16/2023 1359 by Stephanie Blower F, CCC-SLP LTG: Patient will comprehend: Complex auditory information Taken 12/04/2023 1201 by Venetia Bread A, CCC-SLP LTG: Patient will comprehend auditory information with cueing (SLP): Supervision   Problem: RH Expression Communication Goal: LTG Patient will express needs/wants via multi-modal(SLP) Description: LTG:  Patient will express needs/wants via multi-modal communication (gestures/written, etc) with cues (SLP) Flowsheets (Taken 12/16/2023 1359) LTG: Patient will express needs/wants via multimodal communication (gestures/written, etc) with cueing (SLP): Supervision

## 2023-12-16 NOTE — Progress Notes (Signed)
 Pt kept NPO after midnight for EGD tentatively  scheduled this morning.

## 2023-12-16 NOTE — Anesthesia Preprocedure Evaluation (Addendum)
 Anesthesia Evaluation  Patient identified by MRN, date of birth, ID band Patient awake    Reviewed: Allergy  & Precautions, NPO status , Patient's Chart, lab work & pertinent test results, reviewed documented beta blocker date and time   Airway Mallampati: III  TM Distance: >3 FB     Dental   Pulmonary neg COPD   breath sounds clear to auscultation       Cardiovascular hypertension, (-) angina (-) CAD and (-) Past MI + Valvular Problems/Murmurs MR  Rhythm:Regular Rate:Normal  IMPRESSIONS     1. Posterior lateral wall hypokinesis . Left ventricular ejection  fraction, by estimation, is 50 to 55%. The left ventricle has low normal  function. The left ventricle has no regional wall motion abnormalities.  Left ventricular diastolic parameters were   normal.   2. Right ventricular systolic function is normal. The right ventricular  size is normal.   3. Left atrial size was mildly dilated.   4. The mitral valve is abnormal. Moderate mitral valve regurgitation. No  evidence of mitral stenosis. Moderate mitral annular calcification.   5. The aortic valve is tricuspid. There is mild calcification of the  aortic valve. There is mild thickening of the aortic valve. Aortic valve  regurgitation is not visualized. Aortic valve sclerosis is present, with  no evidence of aortic valve stenosis.   6. The inferior vena cava is normal in size with greater than 50%  respiratory variability, suggesting right atrial pressure of 3 mmHg.   7. Agitated saline contrast bubble study was negative, with no evidence  of any interatrial shunt.     Neuro/Psych neg Seizures CVA 11/29/23 CVA, Residual Symptoms    GI/Hepatic ,neg GERD  ,,(+) neg Cirrhosis        Endo/Other    Renal/GU ARFRenal disease     Musculoskeletal   Abdominal   Peds  Hematology  (+) Blood dyscrasia, anemia INR 2.0, thrombocytopenia   Anesthesia Other Findings    Reproductive/Obstetrics                              Anesthesia Physical Anesthesia Plan  ASA: 4  Anesthesia Plan: MAC   Post-op Pain Management:    Induction: Intravenous  PONV Risk Score and Plan: 2 and Ondansetron  and Propofol  infusion  Airway Management Planned: Nasal Cannula and Natural Airway  Additional Equipment:   Intra-op Plan:   Post-operative Plan:   Informed Consent: I have reviewed the patients History and Physical, chart, labs and discussed the procedure including the risks, benefits and alternatives for the proposed anesthesia with the patient or authorized representative who has indicated his/her understanding and acceptance.     Dental advisory given  Plan Discussed with: CRNA  Anesthesia Plan Comments:          Anesthesia Quick Evaluation

## 2023-12-17 ENCOUNTER — Encounter (HOSPITAL_COMMUNITY)
Admission: AD | Disposition: A | Discharge status: Home Health | Payer: Self-pay | Source: Intra-hospital | Attending: Physical Medicine & Rehabilitation

## 2023-12-17 ENCOUNTER — Inpatient Hospital Stay (HOSPITAL_COMMUNITY): Payer: Self-pay | Admitting: Anesthesiology

## 2023-12-17 ENCOUNTER — Encounter (HOSPITAL_COMMUNITY): Payer: Self-pay | Admitting: Physical Medicine & Rehabilitation

## 2023-12-17 DIAGNOSIS — I10 Essential (primary) hypertension: Secondary | ICD-10-CM

## 2023-12-17 DIAGNOSIS — R195 Other fecal abnormalities: Secondary | ICD-10-CM

## 2023-12-17 DIAGNOSIS — E785 Hyperlipidemia, unspecified: Secondary | ICD-10-CM

## 2023-12-17 DIAGNOSIS — I679 Cerebrovascular disease, unspecified: Secondary | ICD-10-CM

## 2023-12-17 HISTORY — PX: COLONOSCOPY: SHX5424

## 2023-12-17 LAB — HEMOGLOBIN AND HEMATOCRIT, BLOOD
HCT: 25.6 % — ABNORMAL LOW (ref 39.0–52.0)
Hemoglobin: 8.3 g/dL — ABNORMAL LOW (ref 13.0–17.0)

## 2023-12-17 LAB — PROTIME-INR
INR: 2.2 — ABNORMAL HIGH (ref 0.8–1.2)
Prothrombin Time: 25.4 s — ABNORMAL HIGH (ref 11.4–15.2)

## 2023-12-17 SURGERY — COLONOSCOPY
Anesthesia: Monitor Anesthesia Care

## 2023-12-17 MED ORDER — MUPIROCIN 2 % EX OINT
TOPICAL_OINTMENT | Freq: Two times a day (BID) | CUTANEOUS | Status: DC
  Filled 2023-12-17: qty 22

## 2023-12-17 MED ORDER — LIDOCAINE 2% (20 MG/ML) 5 ML SYRINGE
INTRAMUSCULAR | Status: DC | PRN
  Administered 2023-12-17: 60 mg via INTRAVENOUS

## 2023-12-17 MED ORDER — WARFARIN SODIUM 2.5 MG PO TABS
2.5000 mg | ORAL_TABLET | Freq: Once | ORAL | Status: AC
  Administered 2023-12-17: 2.5 mg via ORAL
  Filled 2023-12-17: qty 1

## 2023-12-17 MED ORDER — PROPOFOL 500 MG/50ML IV EMUL
INTRAVENOUS | Status: DC | PRN
Start: 2023-12-17 — End: 2023-12-17
  Administered 2023-12-17: 100 ug/kg/min via INTRAVENOUS

## 2023-12-17 MED ORDER — PROPOFOL 10 MG/ML IV BOLUS
INTRAVENOUS | Status: DC | PRN
  Administered 2023-12-17 (×4): 20 mg via INTRAVENOUS

## 2023-12-17 NOTE — Anesthesia Preprocedure Evaluation (Signed)
 Anesthesia Evaluation  Patient identified by MRN, date of birth, ID band Patient awake    Reviewed: Allergy  & Precautions, NPO status , Patient's Chart, lab work & pertinent test results, reviewed documented beta blocker date and time   Airway Mallampati: III  TM Distance: >3 FB     Dental   Pulmonary neg pulmonary ROS, neg COPD   breath sounds clear to auscultation       Cardiovascular hypertension, (-) angina (-) CAD and (-) Past MI + Valvular Problems/Murmurs MR  Rhythm:Regular Rate:Normal  IMPRESSIONS     1. Posterior lateral wall hypokinesis . Left ventricular ejection  fraction, by estimation, is 50 to 55%. The left ventricle has low normal  function. The left ventricle has no regional wall motion abnormalities.  Left ventricular diastolic parameters were   normal.   2. Right ventricular systolic function is normal. The right ventricular  size is normal.   3. Left atrial size was mildly dilated.   4. The mitral valve is abnormal. Moderate mitral valve regurgitation. No  evidence of mitral stenosis. Moderate mitral annular calcification.   5. The aortic valve is tricuspid. There is mild calcification of the  aortic valve. There is mild thickening of the aortic valve. Aortic valve  regurgitation is not visualized. Aortic valve sclerosis is present, with  no evidence of aortic valve stenosis.   6. The inferior vena cava is normal in size with greater than 50%  respiratory variability, suggesting right atrial pressure of 3 mmHg.   7. Agitated saline contrast bubble study was negative, with no evidence  of any interatrial shunt.     Neuro/Psych neg Seizures CVA 11/29/23 CVA, Residual Symptoms    GI/Hepatic ,neg GERD  ,,(+) neg Cirrhosis        Endo/Other    Renal/GU ARFRenal disease     Musculoskeletal   Abdominal   Peds  Hematology  (+) Blood dyscrasia, anemia INR 2.0, thrombocytopenia   Anesthesia Other  Findings   Reproductive/Obstetrics                              Anesthesia Physical Anesthesia Plan  ASA: 4  Anesthesia Plan: MAC   Post-op Pain Management:    Induction: Intravenous  PONV Risk Score and Plan: 2 and Ondansetron  and Propofol  infusion  Airway Management Planned: Nasal Cannula and Natural Airway  Additional Equipment:   Intra-op Plan:   Post-operative Plan:   Informed Consent: I have reviewed the patients History and Physical, chart, labs and discussed the procedure including the risks, benefits and alternatives for the proposed anesthesia with the patient or authorized representative who has indicated his/her understanding and acceptance.     Dental advisory given  Plan Discussed with: CRNA  Anesthesia Plan Comments:          Anesthesia Quick Evaluation

## 2023-12-17 NOTE — Progress Notes (Signed)
 Physical Therapy Discharge Summary  Patient Details  Name: Allen Macdonald MRN: [id number] Date of Birth: [birth year]  Date of Discharge from PT service:{Time; dates multiple:304500300}  Patient has met {NUMBERS 0-12:18577} of {NUMBERS 0-12:18577} long term goals due to {due un:6958322}.  Patient to discharge at [facility] level {LOA:3049010}.   Patient's care partner {care partner:3041650} to provide the necessary {assistance:3041652} assistance at discharge.  Reasons goals not met: ***  Recommendation:  Patient will benefit from ongoing skilled PT services in {setting:3041680} to continue to advance safe functional mobility, address ongoing impairments in ***, and minimize fall risk.  Equipment: {equipment:3041657}  Reasons for discharge: {Reason for discharge:3049018}  Patient/family agrees with progress made and goals achieved: {Pt/Family agree with progress/goals:3049020}  PT Discharge Precautions/Restrictions Precautions Precautions: Fall Precaution/Restrictions Comments: bilat nephrostomy bags, fall, aphasia, mild R hemi (has improved since evaluation) Restrictions Weight Bearing Restrictions Per Provider Order: No Pain Pain Assessment Pain Scale: 0-10 Pain Score: 0-No pain Pain Interference Pain Interference Pain Effect on Sleep: 1. Rarely or not at all Pain Interference with Therapy Activities: 1. Rarely or not at all Pain Interference with Day-to-Day Activities: 1. Rarely or not at all Vision/Perception  Vision - History Ability to See in Adequate Light: 0 Adequate  Cognition Overall Cognitive Status: Within Functional Limits for tasks assessed Arousal/Alertness: Awake/alert Orientation Level: Oriented X4 Attention: Sustained Sustained Attention: Appears intact Selective Attention: Appears intact Memory: Appears intact Awareness: Appears intact Problem Solving: Appears intact Safety/Judgment: Appears intact Sensation Sensation Light Touch: Appears  Intact Hot/Cold: Appears Intact Proprioception: Appears Intact Coordination Gross Motor Movements are Fluid and Coordinated: Yes Motor  Motor Motor: Other (comment) (hemiparesis) Motor - Discharge Observations: R hemiparesis has improved pt's coordination with ambulation  Mobility Bed Mobility Bed Mobility: Supine to Sit;Sit to Supine Supine to Sit: Supervision/Verbal cueing Sit to Supine: Supervision/Verbal cueing Transfers Transfers: Sit to Stand;Stand to Sit;Stand Pivot Transfers Sit to Stand: Independent with assistive device Stand to Sit: Independent with assistive device Stand Pivot Transfers: Independent with assistive device Transfer (Assistive device): Rolling walker Locomotion  Gait Ambulation: Yes Gait Assistance: Supervision/Verbal cueing Gait Distance (Feet): 150 Feet Assistive device: Rolling walker Gait Gait: Yes Gait Pattern: Impaired Gait Pattern: Trunk flexed Stairs / Additional Locomotion Stairs: Yes Stairs Assistance: Supervision/Verbal cueing Stair Management Technique: Two rails Number of Stairs: 4 Height of Stairs: 6 Wheelchair Mobility Wheelchair Mobility: No  Trunk/Postural Assessment  Cervical Assessment Cervical Assessment: Within Functional Limits Thoracic Assessment Thoracic Assessment: Within Functional Limits Lumbar Assessment Lumbar Assessment: Exceptions to [facility] (posterior pelvic tilt) Postural Control Righting Reactions: delayed but has progressed Protective Responses: delayed but has progressed  Balance Balance Balance Assessed: Yes Static Sitting Balance Static Sitting - Balance Support: No upper extremity supported;Feet supported Static Sitting - Level of Assistance: 7: Independent Dynamic Sitting Balance Dynamic Sitting - Balance Support: No upper extremity supported;Feet unsupported Dynamic Sitting - Level of Assistance: 7: Independent Static Standing Balance Static Standing - Balance Support: Bilateral upper extremity  supported Static Standing - Level of Assistance: 6: Modified independent (Device/Increase time) Dynamic Standing Balance Dynamic Standing - Balance Support: Bilateral upper extremity supported;During functional activity Dynamic Standing - Level of Assistance: 5: Stand by assistance Extremity Assessment  RLE Assessment RLE Assessment: Within Functional Limits LLE Assessment LLE Assessment: Within Functional Limits   Shanyiah Conde PTA   12/17/2023, 12:54 PM

## 2023-12-17 NOTE — Op Note (Signed)
 [facility] Patient Name: Allen Macdonald Procedure Date : 12/17/2023 MRN: [id number] Attending MD: Belvie Just , MD, [id number] Date of Birth: 01-29-1951 CSN: [id number] Age: 73 Admit Type: Inpatient Procedure:                Colonoscopy Indications:              Heme positive stool Providers:                Belvie Just, MD, Collene Edu, RN, Haskel Chris,                            Technician Referring MD:              Medicines:                Propofol  per Anesthesia Complications:            No immediate complications. Estimated Blood Loss:     Estimated blood loss: none. Procedure:                Pre-Anesthesia Assessment:                           - Prior to the procedure, a History and Physical                            was performed, and patient medications and                            allergies were reviewed. The patient's tolerance of                            previous anesthesia was also reviewed. The risks                            and benefits of the procedure and the sedation                            options and risks were discussed with the patient.                            All questions were answered, and informed consent                            was obtained. Prior Anticoagulants: The patient has                            taken Coumadin  (warfarin), last dose was day of                            procedure. ASA Grade Assessment: III - A patient                            with severe systemic disease. After reviewing the  risks and benefits, the patient was deemed in                            satisfactory condition to undergo the procedure.                           - Sedation was administered by an anesthesia                            professional. Deep sedation was attained.                           After obtaining informed consent, the colonoscope                            was passed under direct vision.  Throughout the                            procedure, the patient's blood pressure, pulse, and                            oxygen saturations were monitored continuously. The                            CF-HQ190L ([id number]) Olympus colonoscope was                            introduced through the anus and advanced to the the                            terminal ileum. The colonoscopy was performed                            without difficulty. The patient tolerated the                            procedure well. The quality of the bowel                            preparation was evaluated using the BBPS [facility]                            Bowel Preparation Scale) with scores of: Right                            Colon = 2 (minor amount of residual staining, small                            fragments of stool and/or opaque liquid, but mucosa                            seen well), Transverse Colon = 2 (minor amount of  residual staining, small fragments of stool and/or                            opaque liquid, but mucosa seen well) and Left Colon                            = 2 (minor amount of residual staining, small                            fragments of stool and/or opaque liquid, but mucosa                            seen well). The total BBPS score equals 6. The                            quality of the bowel preparation was good. The                            terminal ileum, ileocecal valve, appendiceal                            orifice, and rectum were photographed. Scope In: 7:28:52 AM Scope Out: 7:49:10 AM Scope Withdrawal Time: 0 hours 12 minutes 36 seconds  Total Procedure Duration: 0 hours 20 minutes 18 seconds  Findings:      A 4 mm polyp was found in the descending colon. The polyp was sessile.       The polyp was not removed as he is currently on coumadin  and this is not       the source of his anemia. There was no evidence of any old or fresh        blood in the colon. Impression:               - One 3 mm polyp in the descending colon.                           - No specimens collected. Recommendation:           - Return patient to hospital ward for ongoing care.                           - Resume regular diet.                           - Continue present medications.                           - Monitor HGB and transfuse as necessary.                           - No further GI work up at this time.                           - ? repeat colonoscopy off of coumadin  in the  future as an outpatient. The patient can follow up                            with [facility].                           - Signing off. Procedure Code(s):        --- Professional ---                           [phone], Colonoscopy, flexible; diagnostic, including                            collection of specimen(s) by brushing or washing,                            when performed (separate procedure) Diagnosis Code(s):        --- Professional ---                           R19.5, Other fecal abnormalities                           D12.4, Benign neoplasm of descending colon CPT copyright 2022 American Medical Association. All rights reserved. The codes documented in this report are preliminary and upon coder review may  be revised to meet current compliance requirements. Belvie Just, MD Belvie Just, MD 12/17/2023 10:46:01 AM This report has been signed electronically. Number of Addenda: 0

## 2023-12-17 NOTE — Interval H&P Note (Signed)
 History and Physical Interval Note:  12/17/2023 7:15 AM  Allen Macdonald  has presented today for surgery, with the diagnosis of Anemia, heme positive stool in setting of anticoagulation.  The various methods of treatment have been discussed with the patient and family. After consideration of risks, benefits and other options for treatment, the patient has consented to  Procedure(s): COLONOSCOPY (N/A) as a surgical intervention.  The patient's history has been reviewed, patient examined, no change in status, stable for surgery.  I have reviewed the patient's chart and labs.  Questions were answered to the patient's satisfaction.     Deziyah Arvin D

## 2023-12-17 NOTE — Progress Notes (Signed)
 Occupational Therapy Note  Patient Details  Name: Allen Macdonald MRN: [id number] Date of Birth: 1950/08/21  Today's Date: 12/17/2023 OT Missed Time: 45 Minutes Missed Time Reason: Unavailable (comment);Other (comment) (out for a procedure)  Pt out for a procedure during scheduled OT session. Will try to make up time if possible with scheduling constraints.    [name] 12/17/2023, 12:06 PM

## 2023-12-17 NOTE — Plan of Care (Signed)
 progress

## 2023-12-17 NOTE — Progress Notes (Signed)
 PHARMACY - ANTICOAGULATION CONSULT NOTE  Pharmacy Consult: Warfarin Indication: History of VTE with recurrent CVAs  Allergies  Allergen Reactions   Bee Venom Anaphylaxis    Patient Measurements: Height: 5' 10 (177.8 cm) Weight: 104.9 kg (231 lb 4.2 oz) IBW/kg (Calculated) : 73  Vital Signs: Temp: 98.1 F (36.7 C) (08/30 0846) Temp Source: Oral (08/30 0846) BP: 118/66 (08/30 0846) Pulse Rate: 85 (08/30 0846)  Labs: Recent Labs    12/15/23 0330 12/16/23 0400 12/17/23 0357  HGB 7.1* 8.1* 8.3*  HCT 22.2* 24.9* 25.6*  PLT 72* 74*  --   LABPROT 23.4* 24.0* 25.4*  INR 2.0* 2.0* 2.2*  CREATININE 1.19  --   --     Estimated Creatinine Clearance: 65.1 mL/min (by C-G formula based on SCr of 1.19 mg/dL).  Assessment: 31 YOM with 2 recent L MCA strokes s/p MT each time.  Patient went to Rehab and was receiving Eliquis , last dose on 8/12 at 0847.  Patient returned to the inpatient side with recurrent CVA on 11/29/23.  MRI shows petechial hemorrhage with new/increased scattered punctate infarcts and new microhemorrhages.   Pharmacy consulted to dose warfarin. Stopped Eliquis .  PRBC x 1 on 8/21 & 8/28, Aranesp  x1 on 8/28  INR is therapeutic at 2.2 again this morning. Pt consistently eating ~75-100% of meals. Patient had EGD on 8/29, plan for colonoscopy today. GI note says to continue warfarin for now. No overt bleeding noted, Hgb 8s post transfusion.   Goal of Therapy:  INR 2 to 3 Monitor platelets by anticoagulation protocol: Yes  Plan:  Warfarin 2.5 mg PO x1  Check INR daily while on warfarin CBC Mon/Thurs  Monitor clinical course, s/sx of bleed, PO intake/diet, Drug-Drug Interactions  Thank you for involving pharmacy in this patient's care.  Delon Sax, PharmD, BCPS Clinical Pharmacist Clinical phone for 12/17/2023 is [phone] 12/17/2023 9:36 AM

## 2023-12-17 NOTE — Transfer of Care (Signed)
 Immediate Anesthesia Transfer of Care Note  Patient: Allen Macdonald  Procedure(s) Performed: COLONOSCOPY  Patient Location: PACU  Anesthesia Type:MAC  Level of Consciousness: drowsy, patient cooperative, and responds to stimulation  Airway & Oxygen Therapy: Patient Spontanous Breathing  Post-op Assessment: Report given to RN and Post -op Vital signs reviewed and stable  Post vital signs: Reviewed and stable  Last Vitals:  Vitals Value Taken Time  BP 122/65 12/17/23 07:53  Temp    Pulse 79 12/17/23 07:55  Resp 22 12/17/23 07:55  SpO2 99 % 12/17/23 07:55  Vitals shown include unfiled device data.  Last Pain:  Vitals:   12/17/23 0700  TempSrc: Temporal  PainSc: 0-No pain      Patients Stated Pain Goal: 0 (12/16/23 0950)  Complications: No notable events documented.

## 2023-12-17 NOTE — Plan of Care (Signed)
  Problem: Pain Managment: Goal: General experience of comfort will improve and/or be controlled Outcome: Progressing   Problem: Safety: Goal: Ability to remain free from injury will improve Outcome: Progressing   Problem: Skin Integrity: Goal: Risk for impaired skin integrity will decrease Outcome: Progressing

## 2023-12-17 NOTE — Progress Notes (Signed)
 PROGRESS NOTE   Subjective/Complaints:  Pt doing well, just came back from colonoscopy. Doing well. Slept poorly though. Pain overall well managed. LBM last night. Nephrostomy tubes output normal. No other complaints or concerns.    ROS: as per HPI. Denies CP, SOB, abd pain, N/V/D/C, or any other complaints at this time.    Objective:   No results found. Recent Labs    12/15/23 0330 12/16/23 0400 12/17/23 0357  WBC 5.7 5.8  --   HGB 7.1* 8.1* 8.3*  HCT 22.2* 24.9* 25.6*  PLT 72* 74*  --    Recent Labs    12/15/23 0330  NA 139  K 3.6  CL 108  CO2 22  GLUCOSE 104*  BUN 19  CREATININE 1.19  CALCIUM  8.1*         Intake/Output Summary (Last 24 hours) at 12/17/2023 1030 Last data filed at 12/17/2023 0850 Gross per 24 hour  Intake 747.6 ml  Output 2625 ml  Net -1877.4 ml        Physical Exam: Vital Signs Blood pressure 118/66, pulse 85, temperature 98.1 F (36.7 C), temperature source Oral, resp. rate 16, height 5' 10 (1.778 m), weight 104.9 kg, SpO2 99%.  Constitutional: No distress . Vital signs reviewed. Sitting up in chair.  HEENT: NCAT, EOMI, oral membranes a little dry Neck: supple Cardiovascular: RRR without murmur. No JVD    Respiratory/Chest: CTA Bilaterally without wheezes or rales. Normal effort , port R chest GI/Abdomen: BS +, non-tender, non-distended Ext: no clubbing or cyanosis, trace-1+ BLE edema Psych: pleasant and cooperative but flat Uro: nephrostomy tubes in place with yellow urine Skin: intact over exposed surfaces. Bruises scattered  PRIOR EXAMS: Neuro: Alert and oriented x2, right sided strength is 4/5, otherwise strength is 5/5. Sensory exam normal for light touch and pain in all 4 limbs. No limb ataxia or cerebellar signs. No abnormal tone appreciated.  Still with non-fluent speech, takes his time.   Assessment/Plan: 1. Functional deficits which require 3+ hours per day of  interdisciplinary therapy in a comprehensive inpatient rehab setting. Physiatrist is providing close team supervision and 24 hour management of active medical problems listed below. Physiatrist and rehab team continue to assess barriers to discharge/monitor patient progress toward functional and medical goals  Care Tool:  Bathing    Body parts bathed by patient: Right arm, Left arm, Chest, Abdomen, Right upper leg, Left upper leg, Front perineal area, Face   Body parts bathed by helper: Buttocks, Right lower leg, Left lower leg     Bathing assist Assist Level: Minimal Assistance - Patient > 75%     Upper Body Dressing/Undressing Upper body dressing   What is the patient wearing?: Pull over shirt    Upper body assist Assist Level: Independent    Lower Body Dressing/Undressing Lower body dressing      What is the patient wearing?: Pants, Incontinence brief     Lower body assist Assist for lower body dressing: Supervision/Verbal cueing     Toileting Toileting    Toileting assist Assist for toileting: Contact Guard/Touching assist     Transfers Chair/bed transfer  Transfers assist  Chair/bed transfer activity did not occur: Safety/medical  concerns  Chair/bed transfer assist level: Supervision/Verbal cueing     Locomotion Ambulation   Ambulation assist      Assist level: Supervision/Verbal cueing Assistive device: Walker-rolling Max distance: 150+   Walk 10 feet activity   Assist     Assist level: Supervision/Verbal cueing Assistive device: Walker-rolling   Walk 50 feet activity   Assist    Assist level: Supervision/Verbal cueing Assistive device: Walker-rolling    Walk 150 feet activity   Assist Walk 150 feet activity did not occur: Safety/medical concerns (fatigue)  Assist level: Supervision/Verbal cueing Assistive device: Walker-rolling    Walk 10 feet on uneven surface  activity   Assist Walk 10 feet on uneven surfaces activity  did not occur: Safety/medical concerns (fatigue)   Assist level: Supervision/Verbal cueing Assistive device: Walker-rolling   Wheelchair     Assist Is the patient using a wheelchair?: Yes Type of Wheelchair: Manual    Wheelchair assist level: Dependent - Patient 0%      Wheelchair 50 feet with 2 turns activity    Assist        Assist Level: Dependent - Patient 0%   Wheelchair 150 feet activity     Assist      Assist Level: Dependent - Patient 0%   Blood pressure 118/66, pulse 85, temperature 98.1 F (36.7 C), temperature source Oral, resp. rate 16, height 5' 10 (1.778 m), weight 104.9 kg, SpO2 99%.  Medical Problem List and Plan: 1. Functional deficits secondary to left MCA scattered infarcts with left ICA and left MCA occlusion s/p IR with TICI2c followed by L ICA and basilar tip emboli s/p mechanical thrombectomy TIC3, likely due to hypercoagulable state in setting of cancer             -patient may shower, cover incision/nephrostomy tubes             -ELOS/Goals: 12/17/23.  PT/OT sup to min A, SLP supervision              -Continue CIR therapies including PT, OT, and SLP -d/c delayed, colonoscopy 8/30, medical hold for now.  2.  Antithrombotics: -DVT: hx of multiple BL DVT and PE had IVC filter placed 2014  -US  LE positive for age indeterminate DVTs: RT - PopV and PTV LT - CFV, PFV, FV, PopV, Gastrocs  -Probable hypercoagulable state r/t carcinoma  -Pharmaceutical: previously Eliquis  5mg  BID,  heparin  drip transitioned to   coumadin    -appreciate pharmacy monitoring of INR--therapeutic             -antiplatelet therapy: N/A 3. Pain Management: Tylenol  prn  4. Mood/Behavior/Sleep: LCSW to follow for evaluation and support when available.              -antipsychotic agents: N/A  -melatonin PRN 5. Neuropsych/cognition: This patient may be capable of making decisions on his own behalf. 6. Skin/Wound Care: routine pressure relief measures    7.  Fluids/Electrolytes/Nutrition: Monitor I/O  - Per speech past swallow study continue regular diet w/ Ensure and multivitamin supplements-- reordered from prior admission orders   -encouraging po 8/28 K+ 3.6, continue daily K+ supp -12/17/23 colonoscopy today, reg diet resumed with ok from Dr. Rollin 8.  Bladder Cancer s/p nephrostomy tubes: stage IV, metastatic to the bone by MRI of the spine on 11/08/2023.  -Hx of prostate CA, on androgen therapy- leupron q6mos-- follows with [facility] -prior UTI: Ceftriaxone  1g daily completed 8/10   9. AKI: Monitor BMP w/GFR  Cr 1.22--1.42--1.52>> 1.16 on  8/13.   - 8/18 Cr 1.3 today, ?baseline renal insufficiency?  -encourage fluids -12/10/23 Cr 1.19 yesterday, monitor 10.  Anemia: Hemoglobin stable post PRBC 2 units Hgb 9.9-- downtrended during readmission, 9.0--8.5--8.5--7.7--7.6 but up to 9.2 on 8/17.  -12/05/23 Hgb 8.3, FOBT neg. 8/25 hgb essentially holding at 7.9 today  -received 1u prbc on rehab so far -anemia is normocytic. Suspect he has pancytopenia d/t bladder ca. Could be some GI loss related to previously supratherapeutic INR   -avoid supratherapeutic INR's   -recheck stool x 1 (have not received a second sample)   -continue multivitamin,  b complex and add ferrous sulfate   8/29---hgb down to 7.1 yesterday--stool sample + for OB   -pt down for EGD right now. Appreciate GI help   -protonix  on board   -hgb up to 8.1 after 1u PRBC yesterday -I also gave him a dose of aranesp  yesterday as his anemia is likely more than just GI blood loss given cancer dx -12/17/23 Hgb 8.3 today, colonoscopy today, appreciate GI assistance 11. HTN now with hypotension: hx of orthostasis which resolved after IV fluids--now holding home amlodipine  5 mg daily and Carvdilol 12.5 mg - resume as needed. Avoid hypotension -8/29-30 blood pressures reasonable while still holding meds, no changes   Vitals:   12/16/23 0950 12/16/23 1000 12/16/23 1010 12/16/23 1032  BP:  116/64 (!) 131/59 128/66 131/68   12/16/23 1352 12/16/23 1952 12/17/23 0413 12/17/23 0700  BP: 136/62 (!) 146/71 (!) 136/59 (!) 149/72   12/17/23 0753 12/17/23 0800 12/17/23 0815 12/17/23 0846  BP: 122/65 122/81 132/66 118/66      13. HLD: LDL 112, goal < 70, atorvastatin  80mg  daily   14. Obesity: Educate on diet and weight loss to promote overall health and mobility.  Body mass index is 33.06 kg/m.   15. Hx of laryngeal cancer s/p radiation therapy, voice sounds mildly hoarse  16. GERD: reordered pepcid  20mg  daily PRN since it appears that's how he was using it; might also warrant Protonix ? Monitor.   17. Elevated LFTs: AST 67/ALT 68, alkphos/bili WNL on 8/9. Related to Ca?  -repeat LFTs  perhaps sl improved. Follow up again 8/25    -consider changing statin    -8/25 need to add LFT's on to this morning's labs 18. Thrombocytopenia/pancytopenia:   -8/29 platelets   89k -> 72k -> 66k ->71k > 72k >74k  -likely d/t metastatic bladder cancer -monitor serially -NP spoke with oncology last week. No new recs made   -see discussions above 19. LE edema, L>R  -likely related to albumin /nutritional status. Recent EF 50-55% on ECHO   Filed Weights   12/03/23 1555 12/10/23 1555  Weight: 103.2 kg 104.9 kg    -weights fairly stable  -elevate.   compression stockings pt wore at home   LOS: 14 days A FACE TO FACE EVALUATION WAS PERFORMED  [address] 12/17/2023, 10:30 AM

## 2023-12-17 NOTE — Anesthesia Postprocedure Evaluation (Signed)
 Anesthesia Post Note  Patient: Allen Macdonald  Procedure(s) Performed: COLONOSCOPY     Patient location during evaluation: PACU Anesthesia Type: MAC Level of consciousness: awake and alert Pain management: pain level controlled Vital Signs Assessment: post-procedure vital signs reviewed and stable Respiratory status: spontaneous breathing, nonlabored ventilation, respiratory function stable and patient connected to nasal cannula oxygen Cardiovascular status: stable and blood pressure returned to baseline Postop Assessment: no apparent nausea or vomiting Anesthetic complications: no   No notable events documented.  Last Vitals:  Vitals:   12/17/23 0846 12/17/23 1342  BP: 118/66 136/66  Pulse: 85 98  Resp: 16 18  Temp: 36.7 C 36.4 C  SpO2: 99% 100%    Last Pain:  Vitals:   12/17/23 1342  TempSrc: Oral  PainSc:                  Kimball Manske E

## 2023-12-18 LAB — PROTIME-INR
INR: 2.5 — ABNORMAL HIGH (ref 0.8–1.2)
Prothrombin Time: 28.3 s — ABNORMAL HIGH (ref 11.4–15.2)

## 2023-12-18 LAB — HEMOGLOBIN AND HEMATOCRIT, BLOOD
HCT: 25.8 % — ABNORMAL LOW (ref 39.0–52.0)
Hemoglobin: 8.4 g/dL — ABNORMAL LOW (ref 13.0–17.0)

## 2023-12-18 MED ORDER — WARFARIN SODIUM 2 MG PO TABS
2.0000 mg | ORAL_TABLET | Freq: Once | ORAL | Status: AC
  Administered 2023-12-18: 2 mg via ORAL
  Filled 2023-12-18: qty 1

## 2023-12-18 NOTE — Plan of Care (Signed)
  Problem: Pain Managment: Goal: General experience of comfort will improve and/or be controlled Outcome: Progressing   Problem: Safety: Goal: Ability to remain free from injury will improve Outcome: Progressing   Problem: Skin Integrity: Goal: Risk for impaired skin integrity will decrease Outcome: Progressing

## 2023-12-18 NOTE — Progress Notes (Signed)
 PHARMACY - ANTICOAGULATION CONSULT NOTE  Pharmacy Consult: Warfarin Indication: History of VTE with recurrent CVAs  Allergies  Allergen Reactions   Bee Venom Anaphylaxis    Patient Measurements: Height: 5' 10 (177.8 cm) Weight: 104.9 kg (231 lb 4.2 oz) IBW/kg (Calculated) : 73  Vital Signs: Temp: 98.4 F (36.9 C) (08/31 0318) Temp Source: Oral (08/31 0318) BP: 132/60 (08/31 0318) Pulse Rate: 95 (08/31 0318)  Labs: Recent Labs    12/16/23 0400 12/17/23 0357 12/18/23 0345  HGB 8.1* 8.3* 8.4*  HCT 24.9* 25.6* 25.8*  PLT 74*  --   --   LABPROT 24.0* 25.4* 28.3*  INR 2.0* 2.2* 2.5*    Estimated Creatinine Clearance: 65.1 mL/min (by C-G formula based on SCr of 1.19 mg/dL).  Assessment: Allen Macdonald with 2 recent L MCA strokes s/p MT each time.  Patient went to Rehab and was receiving Eliquis , last dose on 8/12 at 0847.  Patient returned to the inpatient side with recurrent CVA on 11/29/23.  MRI shows petechial hemorrhage with new/increased scattered punctate infarcts and new microhemorrhages.   Pharmacy consulted to dose warfarin. Stopped Eliquis .  PRBC x 1 on 8/21 & 8/28, Aranesp  x1 on 8/28  INR is therapeutic at 2.5 this morning. Pt consistently eating ~75-100% of meals. Patient had EGD on 8/29 and colonoscopy on 8/30. No overt bleeding noted, Hgb 8s post transfusion.   Goal of Therapy:  INR 2 to 3 Monitor platelets by anticoagulation protocol: Yes  Plan:  Warfarin 2 mg PO x1  Check INR daily while on warfarin CBC Mon/Thurs  Monitor clinical course, s/sx of bleed, PO intake/diet, Drug-Drug Interactions  Thank you for involving pharmacy in this patient's care.  Delon Sax, PharmD, BCPS Clinical Pharmacist Clinical phone for 12/18/2023 is [phone] 12/18/2023 8:22 AM

## 2023-12-18 NOTE — Progress Notes (Signed)
 PROGRESS NOTE   Subjective/Complaints:  Pt doing well. Slept well. Denies pain. LBM night before last (before colonoscopy), didn't eat much yesterday though. Nephrostomy tubes output normal. No other complaints or concerns.    ROS: as per HPI. Denies CP, SOB, abd pain, N/V/D/C, or any other complaints at this time.    Objective:   No results found. Recent Labs    12/16/23 0400 12/17/23 0357 12/18/23 0345  WBC 5.8  --   --   HGB 8.1* 8.3* 8.4*  HCT 24.9* 25.6* 25.8*  PLT 74*  --   --    No results for input(s): NA, K, CL, CO2, GLUCOSE, BUN, CREATININE, CALCIUM  in the last 72 hours.        Intake/Output Summary (Last 24 hours) at 12/18/2023 1044 Last data filed at 12/18/2023 0727 Gross per 24 hour  Intake 413 ml  Output 1640 ml  Net -1227 ml        Physical Exam: Vital Signs Blood pressure 132/60, pulse 95, temperature 98.4 F (36.9 C), temperature source Oral, resp. rate 16, height 5' 10 (1.778 m), weight 104.9 kg, SpO2 95%.  Constitutional: No distress . Vital signs reviewed. Sitting up in chair watching tv. HEENT: NCAT, EOMI, oral membranes moist today Neck: supple Cardiovascular: RRR without murmur. No JVD    Respiratory/Chest: CTA Bilaterally without wheezes or rales. Normal effort , port R chest GI/Abdomen: BS +, non-tender, non-distended Ext: no clubbing or cyanosis, 1+ BLE edema Psych: pleasant and cooperative but flat Uro: nephrostomy tubes in place with yellow urine Skin: intact over exposed surfaces. Bruises scattered  PRIOR EXAMS: Neuro: Alert and oriented x2, right sided strength is 4/5, otherwise strength is 5/5. Sensory exam normal for light touch and pain in all 4 limbs. No limb ataxia or cerebellar signs. No abnormal tone appreciated.  Still with non-fluent speech, takes his time.   Assessment/Plan: 1. Functional deficits which require 3+ hours per day of interdisciplinary  therapy in a comprehensive inpatient rehab setting. Physiatrist is providing close team supervision and 24 hour management of active medical problems listed below. Physiatrist and rehab team continue to assess barriers to discharge/monitor patient progress toward functional and medical goals  Care Tool:  Bathing    Body parts bathed by patient: Right arm, Left arm, Chest, Abdomen, Right upper leg, Left upper leg, Front perineal area, Face   Body parts bathed by helper: Buttocks, Right lower leg, Left lower leg     Bathing assist Assist Level: Minimal Assistance - Patient > 75%     Upper Body Dressing/Undressing Upper body dressing   What is the patient wearing?: Pull over shirt    Upper body assist Assist Level: Independent    Lower Body Dressing/Undressing Lower body dressing      What is the patient wearing?: Pants, Incontinence brief     Lower body assist Assist for lower body dressing: Supervision/Verbal cueing     Toileting Toileting    Toileting assist Assist for toileting: Contact Guard/Touching assist     Transfers Chair/bed transfer  Transfers assist  Chair/bed transfer activity did not occur: Safety/medical concerns  Chair/bed transfer assist level: Independent with assistive device  Locomotion Ambulation   Ambulation assist      Assist level: Supervision/Verbal cueing Assistive device: Walker-rolling Max distance: 150+   Walk 10 feet activity   Assist     Assist level: Supervision/Verbal cueing Assistive device: Walker-rolling   Walk 50 feet activity   Assist    Assist level: Supervision/Verbal cueing Assistive device: Walker-rolling    Walk 150 feet activity   Assist Walk 150 feet activity did not occur: Safety/medical concerns (fatigue)  Assist level: Supervision/Verbal cueing Assistive device: Walker-rolling    Walk 10 feet on uneven surface  activity   Assist Walk 10 feet on uneven surfaces activity did not  occur: Safety/medical concerns (fatigue)   Assist level: Supervision/Verbal cueing Assistive device: Walker-rolling   Wheelchair     Assist Is the patient using a wheelchair?: No Type of Wheelchair: Manual    Wheelchair assist level: Dependent - Patient 0%      Wheelchair 50 feet with 2 turns activity    Assist        Assist Level: Dependent - Patient 0%   Wheelchair 150 feet activity     Assist      Assist Level: Dependent - Patient 0%   Blood pressure 132/60, pulse 95, temperature 98.4 F (36.9 C), temperature source Oral, resp. rate 16, height 5' 10 (1.778 m), weight 104.9 kg, SpO2 95%.  Medical Problem List and Plan: 1. Functional deficits secondary to left MCA scattered infarcts with left ICA and left MCA occlusion s/p IR with TICI2c followed by L ICA and basilar tip emboli s/p mechanical thrombectomy TIC3, likely due to hypercoagulable state in setting of cancer             -patient may shower, cover incision/nephrostomy tubes             -ELOS/Goals: 12/17/23.  PT/OT sup to min A, SLP supervision              -Continue CIR therapies including PT, OT, and SLP -d/c delayed, colonoscopy 8/30, medical hold for now.  2.  Antithrombotics: -DVT: hx of multiple BL DVT and PE had IVC filter placed 2014  -US  LE positive for age indeterminate DVTs: RT - PopV and PTV LT - CFV, PFV, FV, PopV, Gastrocs  -Probable hypercoagulable state r/t carcinoma  -Pharmaceutical: previously Eliquis  5mg  BID,  heparin  drip transitioned to   coumadin    -appreciate pharmacy monitoring of INR--therapeutic             -antiplatelet therapy: N/A 3. Pain Management: Tylenol  prn  4. Mood/Behavior/Sleep: LCSW to follow for evaluation and support when available.              -antipsychotic agents: N/A  -melatonin PRN 5. Neuropsych/cognition: This patient may be capable of making decisions on his own behalf. 6. Skin/Wound Care: routine pressure relief measures    7.  Fluids/Electrolytes/Nutrition: Monitor I/O  - Per speech past swallow study continue regular diet w/ Ensure and multivitamin supplements-- reordered from prior admission orders   -encouraging po 8/28 K+ 3.6, continue daily K+ supp -12/17/23 colonoscopy today, reg diet resumed with ok from Dr. Rollin 8.  Bladder Cancer s/p nephrostomy tubes: stage IV, metastatic to the bone by MRI of the spine on 11/08/2023.  -Hx of prostate CA, on androgen therapy- leupron q6mos-- follows with [facility]On[facility] -prior UTI: Ceftriaxone  1g daily completed 8/10   9. AKI: Monitor BMP w/GFR  Cr 1.22--1.42--1.52>> 1.16 on 8/13.   - 8/18 Cr 1.3 today, ?baseline renal insufficiency?  -  encourage fluids -12/10/23 Cr 1.19 yesterday, monitor 10.  Anemia: Hemoglobin stable post PRBC 2 units Hgb 9.9-- downtrended during readmission, 9.0--8.5--8.5--7.7--7.6 but up to 9.2 on 8/17.  -12/05/23 Hgb 8.3, FOBT neg. 8/25 hgb essentially holding at 7.9 today  -received 1u prbc on rehab so far -anemia is normocytic. Suspect he has pancytopenia d/t bladder ca. Could be some GI loss related to previously supratherapeutic INR   -avoid supratherapeutic INR's   -recheck stool x 1 (have not received a second sample)   -continue multivitamin,  b complex and add ferrous sulfate   8/29---hgb down to 7.1 yesterday--stool sample + for OB   -pt down for EGD right now. Appreciate GI help   -protonix  on board   -hgb up to 8.1 after 1u PRBC yesterday -I also gave him a dose of aranesp  yesterday as his anemia is likely more than just GI blood loss given cancer dx -12/18/23 Hgb 8.4 today, colonoscopy yesterday without definite source 11. HTN now with hypotension: hx of orthostasis which resolved after IV fluids--now holding home amlodipine  5 mg daily and Carvdilol 12.5 mg - resume as needed. Avoid hypotension -8/29-31 blood pressures reasonable while still holding meds, no changes   Vitals:   12/16/23 1032 12/16/23 1352 12/16/23 1952 12/17/23 0413  BP:  131/68 136/62 (!) 146/71 (!) 136/59   12/17/23 0700 12/17/23 0753 12/17/23 0800 12/17/23 0815  BP: (!) 149/72 122/65 122/81 132/66   12/17/23 0846 12/17/23 1342 12/17/23 1929 12/18/23 0318  BP: 118/66 136/66 135/67 132/60      13. HLD: LDL 112, goal < 70, atorvastatin  80mg  daily   14. Obesity: Educate on diet and weight loss to promote overall health and mobility.  Body mass index is 33.06 kg/m.   15. Hx of laryngeal cancer s/p radiation therapy, voice sounds mildly hoarse  16. GERD: reordered pepcid  20mg  daily PRN since it appears that's how he was using it; might also warrant Protonix ? Monitor.   17. Elevated LFTs: AST 67/ALT 68, alkphos/bili WNL on 8/9. Related to Ca?  -repeat LFTs  perhaps sl improved. Follow up again 8/25    -consider changing statin    -8/25 need to add LFT's on to this morning's labs 18. Thrombocytopenia/pancytopenia:   -8/29 platelets   89k -> 72k -> 66k ->71k > 72k >74k  -likely d/t metastatic bladder cancer -monitor serially -NP spoke with oncology last week. No new recs made   -see discussions above 19. LE edema, L>R  -likely related to albumin /nutritional status. Recent EF 50-55% on ECHO   Filed Weights   12/03/23 1555 12/10/23 1555  Weight: 103.2 kg 104.9 kg    -weights fairly stable  -elevate.   compression stockings pt wore at home   LOS: 15 days A FACE TO [facility] EVALUATION WAS PERFORMED  [address] 12/18/2023, 10:44 AM

## 2023-12-18 NOTE — Progress Notes (Incomplete)
 Physical Therapy Session Note  Patient Details  Name: Allen Macdonald MRN: [id number] Date of Birth: July 08, 1950  {CHL IP REHAB PT TIME CALCULATION:[id number]}  Short Term Goals: Week 2:  PT Short Term Goal 1 (Week 2): = LTGs  Skilled Therapeutic Interventions/Progress Updates:        Therapy Documentation Precautions:  Precautions Precautions: Fall Recall of Precautions/Restrictions: Intact Precaution/Restrictions Comments: bilat nephrostomy bags, fall, aphasia, mild R hemi (has improved since evaluation) Restrictions Weight Bearing Restrictions Per Provider Order: No   Therapy/Group: Individual Therapy  [facility] [place], [place], DPT  12/18/2023, 1:04 PM

## 2023-12-18 NOTE — Progress Notes (Signed)
 Physical Therapy Session Note  Patient Details  Name: Allen Macdonald MRN: [id number] Date of Birth: 1951/03/14  Today's Date: 12/18/2023 PT Individual Time: 1345-1440 PT Individual Time Calculation (min): 55 min   Short Term Goals: Week 2:  PT Short Term Goal 1 (Week 2): = LTGs  Skilled Therapeutic Interventions/Progress Updates:      Pt seated in recliner upon arrival. Pt agreeable to therapy. Pt denies any pain. Pt wife present throughout session.   Pt and wife confirms his rollator has been delivered to the house. Followed up regarding how pt is getting home. Pt endorses his friend will be bringing him and his wife home in the [place] equinox.   Pt endorses the bed rail for pt bed has been delivered.   Pt performed sit to stand, and bed to chair transfer with mod I.   Pt ambulated room<>ortho gym<>main gym<>rehab apartment<>room with RW and supervision, verbal cues provided for pursed lip breathing.   Pt performed ambulatory transfer to car simulator with RW and supervision, verbal cues provided to scoot back prior to lifting legs into car.   Pt navigated 4 6 inch steps with R handrail and supervision for ascending and CGA for descending verbal cues provided for up with the good and down with the bad. Pt reports he doesn't have a left handrail but does utilize UE support on fridge positioned to L of stairs. Education provided for alterative technique with B UE support on R ascending handrail and lateral step technique. Pt demonstrates improved stability with this technique especially with descending with supervision. Pt and wife appreciative of this alternative option.   Pt supine in bed at end of session with all needs within reach and bed alarm on.    Therapy Documentation Precautions:  Precautions Precautions: Fall Recall of Precautions/Restrictions: Intact Precaution/Restrictions Comments: bilat nephrostomy bags, fall, aphasia, mild R hemi (has improved since  evaluation) Restrictions Weight Bearing Restrictions Per Provider Order: No  Therapy/Group: Individual Therapy  [facility] Doreene Orris, [place], DPT  12/18/2023, 1:52 PM

## 2023-12-18 NOTE — Progress Notes (Signed)
 Occupational Therapy Session Note  Patient Details  Name: Allen Macdonald MRN: [id number] Date of Birth: 05-24-1950  Today's Date: 12/18/2023 OT Individual Time:  -       Short Term Goals: Week 2:  OT Short Term Goal 1 (Week 2): STG= LTGs  Skilled Therapeutic Interventions/Progress Updates:  Pt greeted seated in recliner, pt agreeable to OT intervention.    Completed DC requirements as indicated below during session. Noted R nephrostomy bag had lost strapping for around leg, manufactured strap out of splint material to facilitate optimal positioning during functional ambulation.   Transfers/bed mobility/functional mobility:  Pt completed functional ambulation with Rw with supervision greater than a household distance.    Exercises: pt completed below circuit workout to facilitate improved global endurance. Pt completed each exercise for 1 min with a 30 sec rest break provided:  Sit>stands Toe taps to 4inch step with BUE support from RW Stepping over hurdle with R and L foot with BUE support from RW Step ups to 3 inch step with BUE support from RW Seated ball tosses to rebounder Standing ball tosses to [employer] ball tosses + one toss catch independently to challenge motor planning and Bimanual integration  2x1 min of ball tosses with pt using 2lb dowel to pass ball back/forth from seated position 5 mins of continuous functional ambulation with Rw with supervision and no rest breaks needed                 Ended session with pt seated in recliner with all needs within reach.  Therapy Documentation Precautions:  Precautions Precautions: Fall Recall of Precautions/Restrictions: Intact Precaution/Restrictions Comments: bilat nephrostomy bags, fall, aphasia, mild R hemi (has improved since evaluation) Restrictions Weight Bearing Restrictions Per Provider Order: No  Pain: No pain   ADL ADL Eating: Independent Grooming: Independent Where Assessed-Grooming: Standing at  sink Upper Body Bathing: Supervision/safety Where Assessed-Upper Body Bathing: Wheelchair Lower Body Bathing: Supervision/safety Where Assessed-Lower Body Bathing: Wheelchair Upper Body Dressing: Independent Where Assessed-Upper Body Dressing: Chair Lower Body Dressing: Supervision/safety Where Assessed-Lower Body Dressing: Chair Toileting: Supervision/safety Where Assessed-Toileting: Teacher, adult education: Close supervision Toilet Transfer Method: Insurance claims handler: Not assessed Film/video editor: Not assessed ADL Comments: pt sponge baths at baseline d/t drains, grossly supervision for Adl participation. Vision Baseline Vision/History: 0 No visual deficits Wears Glasses: Reading only Patient Visual Report: No change from baseline Perception  Perception: Impaired Perception-Other Comments: mild R inattention but improved from eval Praxis Praxis: WFL Cognition Cognition Overall Cognitive Status: Within Functional Limits for tasks assessed Brief Interview for Mental Status (BIMS) Repetition of Three Words (First Attempt): 3 Temporal Orientation: Year: Correct Temporal Orientation: Month: Accurate within 5 days Temporal Orientation: Day: Correct Recall: Sock: No, could not recall Recall: Blue: Yes, no cue required Recall: Bed: Yes, no cue required BIMS Summary Score: 13 Sensation Sensation Light Touch: Appears Intact Hot/Cold: Appears Intact Proprioception: Appears Intact Stereognosis: Not tested Coordination Gross Motor Movements are Fluid and Coordinated: Yes Fine Motor Movements are Fluid and Coordinated: No Motor  Motor Motor - Discharge Observations: improved R hemiparesis from eval Mobility  Bed Mobility Bed Mobility: Supine to Sit;Sit to Supine Supine to Sit: Supervision/Verbal cueing Sit to Supine: Supervision/Verbal cueing Transfers Sit to Stand: Independent with assistive device Stand to Sit: Independent with assistive device   Trunk/Postural Assessment  Cervical Assessment Cervical Assessment: Within Functional Limits Thoracic Assessment Thoracic Assessment: Within Functional Limits Lumbar Assessment Lumbar Assessment: Exceptions to [facility] (posterior pelvic tilt) Postural Control Postural Control: Within Functional  Limits  Balance Static Sitting Balance Static Sitting - Balance Support: No upper extremity supported;Feet supported Static Sitting - Level of Assistance: 7: Independent Dynamic Sitting Balance Dynamic Sitting - Balance Support: No upper extremity supported;Feet unsupported Dynamic Sitting - Level of Assistance: 7: Independent Dynamic Sitting - Balance Activities: Reaching across midline;Reaching for objects Static Standing Balance Static Standing - Balance Support: Bilateral upper extremity supported Static Standing - Level of Assistance: 6: Modified independent (Device/Increase time) Dynamic Standing Balance Dynamic Standing - Balance Support: Bilateral upper extremity supported;During functional activity Dynamic Standing - Level of Assistance: 5: Stand by assistance Extremity/Trunk Assessment RUE Assessment RUE Assessment: Exceptions to [facility] Active Range of Motion (AROM) Comments: ~ 170* of shoulder flexion General Strength Comments: grossly 4/5 LUE Assessment LUE Assessment: Within Functional Limits  Therapy/Group: Individual Therapy  Ronal Gift [facility] 12/18/2023, 11:20 AM

## 2023-12-18 NOTE — Progress Notes (Signed)
 Spoke with patient as well as wife who is at bedside.  We discussed the essentially normal GI workup with upper and lower endoscopies.  We discussed that the likely cause of his anemia is the bladder cancer itself and to this point the patient also has thrombocytopenia although the white blood cell count is still in the normal range. We discussed that he will get another hemoglobin checked in the morning and if it stays above 7 he should be able to go home with follow-up CBC to be done by home health end of this week and results sent to PCP.  He also has an appointment with his new oncologist on 10 September. Will plan discharge tomorrow once cleared by attending physician who will be covering for me.  This will require review of blood work. The patient's wife would like to use the [facility] pharmacy.

## 2023-12-18 NOTE — Progress Notes (Signed)
 Occupational Therapy Discharge Summary  Patient Details  Name: Allen Macdonald MRN: [id number] Date of Birth: January 01, 1951  Date of Discharge from OT service:{Time; dates multiple:304500300}  {CHL IP REHAB OT TIME CALCULATIONS:304400400}   Patient has met {NUMBERS 0-12:18577} of {NUMBERS 0-12:18577} long term goals due to {due un:6958348}.  Patient to discharge at overall {LOA:3049010} level.  Patient's care partner {care partner:3041650} to provide the necessary {assistance:3041652} assistance at discharge.    Reasons goals not met: ***  Recommendation:  Patient will benefit from ongoing skilled OT services in {setting:3041680} to continue to advance functional skills in the area of {ADL/iADL:3041649}.  Equipment: {equipment:3041657}  Reasons for discharge: {Reason for discharge:3049018}  Patient/family agrees with progress made and goals achieved: {Pt/Family agree with progress/goals:3049020}  OT Discharge Precautions/Restrictions    General   Vital Signs   Pain Pain Assessment Pain Scale: 0-10 Pain Score: 7  ADL ADL Eating: Independent Grooming: Independent Where Assessed-Grooming: Standing at sink Upper Body Bathing: Supervision/safety Where Assessed-Upper Body Bathing: Wheelchair Lower Body Bathing: Supervision/safety Where Assessed-Lower Body Bathing: Wheelchair Upper Body Dressing: Independent Where Assessed-Upper Body Dressing: Chair Lower Body Dressing: Supervision/safety Where Assessed-Lower Body Dressing: Chair Toileting: Supervision/safety Where Assessed-Toileting: Teacher, adult education: Close supervision Toilet Transfer Method: Insurance claims handler: Not assessed Film/video editor: Not assessed ADL Comments: pt sponge baths at baseline d/t drains, grossly supervision for Adl participation. Vision Baseline Vision/History: 0 No visual deficits Wears Glasses: Reading only Patient Visual Report: No change from baseline Perception   Perception: Impaired Perception-Other Comments: mild R inattention but improved from eval Praxis Praxis: WFL Cognition Cognition Overall Cognitive Status: Within Functional Limits for tasks assessed Brief Interview for Mental Status (BIMS) Repetition of Three Words (First Attempt): 3 Temporal Orientation: Year: Correct Temporal Orientation: Month: Accurate within 5 days Temporal Orientation: Day: Correct Recall: Sock: No, could not recall Recall: Blue: Yes, no cue required Recall: Bed: Yes, no cue required BIMS Summary Score: 13 Sensation Sensation Light Touch: Appears Intact Hot/Cold: Appears Intact Proprioception: Appears Intact Stereognosis: Not tested Coordination Gross Motor Movements are Fluid and Coordinated: Yes Fine Motor Movements are Fluid and Coordinated: No Motor  Motor Motor - Discharge Observations: improved R hemiparesis from eval Mobility  Bed Mobility Bed Mobility: Supine to Sit;Sit to Supine Supine to Sit: Supervision/Verbal cueing Sit to Supine: Supervision/Verbal cueing Transfers Sit to Stand: Independent with assistive device Stand to Sit: Independent with assistive device  Trunk/Postural Assessment  Cervical Assessment Cervical Assessment: Within Functional Limits Thoracic Assessment Thoracic Assessment: Within Functional Limits Lumbar Assessment Lumbar Assessment: Exceptions to [facility] (posterior pelvic tilt) Postural Control Postural Control: Within Functional Limits  Balance Static Sitting Balance Static Sitting - Balance Support: No upper extremity supported;Feet supported Static Sitting - Level of Assistance: 7: Independent Dynamic Sitting Balance Dynamic Sitting - Balance Support: No upper extremity supported;Feet unsupported Dynamic Sitting - Level of Assistance: 7: Independent Dynamic Sitting - Balance Activities: Reaching across midline;Reaching for objects Static Standing Balance Static Standing - Balance Support: Bilateral  upper extremity supported Static Standing - Level of Assistance: 6: Modified independent (Device/Increase time) Dynamic Standing Balance Dynamic Standing - Balance Support: Bilateral upper extremity supported;During functional activity Dynamic Standing - Level of Assistance: 5: Stand by assistance Extremity/Trunk Assessment RUE Assessment RUE Assessment: Exceptions to [facility] Active Range of Motion (AROM) Comments: ~ 170* of shoulder flexion General Strength Comments: grossly 4/5 LUE Assessment LUE Assessment: Within Functional Limits   Allen Macdonald 12/18/2023, 11:01 AM

## 2023-12-18 NOTE — Progress Notes (Signed)
 Speech Language Pathology Daily Session Note  Patient Details  Name: Allen Macdonald MRN: [id number] Date of Birth: 08-22-1950  Today's Date: 12/18/2023 SLP Individual Time: 0910-1003 SLP Individual Time Calculation (min): 53 min  Short Term Goals: Week 2: SLP Short Term Goal 1 (Week 2): STG = LTG due to ELOS  Skilled Therapeutic Interventions:  Pt seen for ST targeting communication and cognitive linguistic goals. Pt with difficulty confirming month of birth (stating '5', aware of error but struggled to say '6'). Reported 8/10 pain in his bottom/back area; SLP alerted NSG who provided pain medications. SLP facilitated generative naming task and pt had 100% acc IND. Pt then challenged to describe images and he had 100% acc when provided min A. Pt required min VC for thought organization in conversation and benefited from increased wait time. SLP reviewed word finding strategies and provided education re: HH ST per pt request. Pt left in chair with call bell in reach. Discharge from ST upcoming with goals met as he will be discharging home.   Pain Pain Assessment Pain Scale: 0-10 Pain Score: 8  Faces Pain Scale: Hurts a little bit Pain Location: Buttocks Pain Orientation: Right Pain Intervention(s): RN made aware  Therapy/Group: Individual Therapy  Waddell JONETTA Novak, MA CCC-SLP 12/18/2023, 3:56 PM

## 2023-12-19 DIAGNOSIS — R195 Other fecal abnormalities: Secondary | ICD-10-CM

## 2023-12-19 DIAGNOSIS — R600 Localized edema: Secondary | ICD-10-CM

## 2023-12-19 LAB — PROTIME-INR
INR: 2.6 — ABNORMAL HIGH (ref 0.8–1.2)
Prothrombin Time: 29.1 s — ABNORMAL HIGH (ref 11.4–15.2)

## 2023-12-19 LAB — BASIC METABOLIC PANEL WITH GFR
Anion gap: 7 (ref 5–15)
BUN: 13 mg/dL (ref 8–23)
CO2: 24 mmol/L (ref 22–32)
Calcium: 7.9 mg/dL — ABNORMAL LOW (ref 8.9–10.3)
Chloride: 107 mmol/L (ref 98–111)
Creatinine, Ser: 1.29 mg/dL — ABNORMAL HIGH (ref 0.61–1.24)
GFR, Estimated: 58 mL/min — ABNORMAL LOW (ref >60)
Glucose, Bld: 101 mg/dL — ABNORMAL HIGH (ref 70–99)
Potassium: 3.2 mmol/L — ABNORMAL LOW (ref 3.5–5.1)
Sodium: 138 mmol/L (ref 135–145)

## 2023-12-19 LAB — HEMOGLOBIN AND HEMATOCRIT, BLOOD
HCT: 25.5 % — ABNORMAL LOW (ref 39.0–52.0)
Hemoglobin: 8.2 g/dL — ABNORMAL LOW (ref 13.0–17.0)

## 2023-12-19 MED ORDER — WARFARIN SODIUM 2 MG PO TABS: 2.0000 mg | ORAL_TABLET | Freq: Every day | ORAL | 0 refills | Status: DC

## 2023-12-19 MED ORDER — FERROUS SULFATE 325 (65 FE) MG PO TABS: 325.0000 mg | ORAL_TABLET | Freq: Every day | ORAL | 0 refills | Status: DC

## 2023-12-19 MED ORDER — ALUM & MAG HYDROXIDE-SIMETH 200-200-20 MG/5ML PO SUSP: 30.0000 mL | ORAL | 0 refills | Status: DC | PRN

## 2023-12-19 MED ORDER — POTASSIUM CHLORIDE CRYS ER 20 MEQ PO TBCR: 20.0000 meq | EXTENDED_RELEASE_TABLET | Freq: Every day | ORAL | 0 refills | Status: DC

## 2023-12-19 MED ORDER — MELATONIN 5 MG PO TABS: 5.0000 mg | ORAL_TABLET | Freq: Every evening | ORAL | Status: DC | PRN

## 2023-12-19 MED ORDER — HEPARIN SOD (PORK) LOCK FLUSH 100 UNIT/ML IV SOLN
500.0000 [IU] | INTRAVENOUS | Status: AC | PRN
  Administered 2023-12-19: 500 [IU]
  Filled 2023-12-19: qty 5

## 2023-12-19 MED ORDER — ENSURE PLUS HIGH PROTEIN PO LIQD: 237.0000 mL | Freq: Two times a day (BID) | ORAL | Status: DC

## 2023-12-19 MED ORDER — FUROSEMIDE 20 MG PO TABS
20.0000 mg | ORAL_TABLET | Freq: Once | ORAL | Status: AC
  Administered 2023-12-19: 20 mg via ORAL
  Filled 2023-12-19: qty 1

## 2023-12-19 MED ORDER — B COMPLEX-C PO TABS: 1.0000 | ORAL_TABLET | Freq: Every day | ORAL | 0 refills | Status: DC

## 2023-12-19 MED ORDER — POTASSIUM CHLORIDE CRYS ER 20 MEQ PO TBCR
20.0000 meq | EXTENDED_RELEASE_TABLET | Freq: Once | ORAL | Status: AC
  Administered 2023-12-19: 20 meq via ORAL
  Filled 2023-12-19: qty 1

## 2023-12-19 MED ORDER — WARFARIN SODIUM 2 MG PO TABS: 2.0000 mg | ORAL_TABLET | Freq: Every day | ORAL | Status: DC

## 2023-12-19 MED ORDER — PANTOPRAZOLE SODIUM 40 MG PO TBEC: 40.0000 mg | DELAYED_RELEASE_TABLET | Freq: Two times a day (BID) | ORAL | 0 refills | Status: DC

## 2023-12-19 MED ORDER — POLYETHYLENE GLYCOL 3350 17 G PO PACK: 17.0000 g | PACK | Freq: Every day | ORAL | 0 refills | Status: DC | PRN

## 2023-12-19 MED ORDER — MUPIROCIN 2 % EX OINT: TOPICAL_OINTMENT | CUTANEOUS | Status: DC

## 2023-12-19 MED ORDER — ADULT MULTIVITAMIN W/MINERALS CH: 1.0000 | ORAL_TABLET | Freq: Every day | ORAL | 0 refills | Status: DC

## 2023-12-19 MED ORDER — ATORVASTATIN CALCIUM 80 MG PO TABS: 80.0000 mg | ORAL_TABLET | Freq: Every day | ORAL | 0 refills | Status: DC

## 2023-12-19 MED ORDER — ACETAMINOPHEN 325 MG PO TABS: 650.0000 mg | ORAL_TABLET | Freq: Four times a day (QID) | ORAL | Status: DC | PRN

## 2023-12-19 NOTE — Progress Notes (Signed)
 Inpatient Rehabilitation Care Coordinator Discharge Note   Patient Details  Name: Allen Macdonald MRN: [id number] Date of Birth: 09-17-1950   Discharge location: HOME WITH WIFE WHO CAN PROVIDE SUPERVISION  Length of Stay: 16 DAYS  Discharge activity level: MOD/I-SUPERVISION LEVEL  Home/community participation: ACTIVE  Patient response un:Yzjouy Literacy - How often do you need to have someone help you when you read instructions, pamphlets, or other written material from your doctor or pharmacy?: Never  Patient response un:Dnrpjo Isolation - How often do you feel lonely or isolated from those around you?: Never  Services provided included: MD, RD, PT, OT, SLP, RN, CM, TR, Pharmacy, Neuropsych, SW  Financial Services:  Field seismologist Utilized: Physiological scientist MEDICARE  Choices offered to/list presented to: NA  Follow-up services arranged:  Home Health, DME, Patient/Family has no preference for HH/DME agencies Home Health Agency: [facility]  PT  OT  SP  RN    DME : [facility]    Patient response to transportation need: Is the patient able to respond to transportation needs?: Yes In the past 12 months, has lack of transportation kept you from medical appointments or from getting medications?: No In the past 12 months, has lack of transportation kept you from meetings, work, or from getting things needed for daily living?: No   Patient/Family verbalized understanding of follow-up arrangements:  Yes  Individual responsible for coordination of the follow-up plan: [name]-WIFE [phone]  Confirmed correct DME delivered: [name] 12/19/2023    Comments (or additional information):WIFE WAS IN FOR HANDS ON TRAINING AND PT MEDICALLY STABLE FOR DC TODAY ACCORDING TO MD. PT REALLY WANTS TO GO HOME  Summary of Stay    Date/Time Discharge Planning CSW  12/14/23 0840 Doing well and progressing trying to get wife in for family education,  but needs to get a ride. Awaiting team's recommendations for follow up/DME RGD  12/07/23 0920 Home with wife who has visual deficits but can assist him. Pt is doing better after coming back to [facility]. Neuro-psych to see while here RGD       Brieanna Nau, [name]

## 2023-12-19 NOTE — Progress Notes (Signed)
 PROGRESS NOTE   Subjective/Complaints:  Pt up in chair. Anxious to get home. Notes swelling in both legs. Has had edema as documented in past. Doesn't have TEDs on this morning  ROS: Patient denies fever, rash, sore throat, blurred vision, dizziness, nausea, vomiting, diarrhea, cough, shortness of breath or chest pain, joint or back/neck pain, headache, or mood change.   Objective:   No results found. Recent Labs    12/18/23 0345 12/19/23 0358  HGB 8.4* 8.2*  HCT 25.8* 25.5*   Recent Labs    12/19/23 0358  NA 138  K 3.2*  CL 107  CO2 24  GLUCOSE 101*  BUN 13  CREATININE 1.29*  CALCIUM  7.9*          Intake/Output Summary (Last 24 hours) at 12/19/2023 0917 Last data filed at 12/19/2023 0700 Gross per 24 hour  Intake 708 ml  Output 1400 ml  Net -692 ml        Physical Exam: Vital Signs Blood pressure 120/67, pulse 87, temperature 98.1 F (36.7 C), temperature source Oral, resp. rate 18, height 5' 10 (1.778 m), weight 104.9 kg, SpO2 96%.  Constitutional: No distress . Vital signs reviewed. HEENT: NCAT, EOMI, oral membranes moist Neck: supple Cardiovascular: RRR without murmur. No JVD    Respiratory/Chest: CTA Bilaterally without wheezes or rales. Normal effort    GI/Abdomen: BS +, non-tender, non-distended Ext: no clubbing, cyanosis, 1++ BLE Psych: pleasant and cooperative  Psych: pleasant and cooperative but flat Uro: nephrostomy tubes in place with yellow urine present Skin: intact over exposed surfaces. Bruises scattered  PRIOR EXAMS: Neuro: Alert and oriented x2, right sided strength is 4/5, otherwise strength is 5/5. Sensory exam normal for light touch and pain in all 4 limbs. No limb ataxia or cerebellar signs. No abnormal tone appreciated.  Still with non-fluent speech, takes his time.   Assessment/Plan: 1. Functional deficits which require 3+ hours per day of interdisciplinary therapy in a  comprehensive inpatient rehab setting. Physiatrist is providing close team supervision and 24 hour management of active medical problems listed below. Physiatrist and rehab team continue to assess barriers to discharge/monitor patient progress toward functional and medical goals  Care Tool:  Bathing    Body parts bathed by patient: Right arm, Left arm, Chest, Abdomen, Right upper leg, Left upper leg, Front perineal area, Face   Body parts bathed by helper: Buttocks, Right lower leg, Left lower leg     Bathing assist Assist Level: Supervision/Verbal cueing     Upper Body Dressing/Undressing Upper body dressing   What is the patient wearing?: Pull over shirt    Upper body assist Assist Level: Independent    Lower Body Dressing/Undressing Lower body dressing      What is the patient wearing?: Pants, Incontinence brief     Lower body assist Assist for lower body dressing: Supervision/Verbal cueing     Toileting Toileting    Toileting assist Assist for toileting: Supervision/Verbal cueing     Transfers Chair/bed transfer  Transfers assist  Chair/bed transfer activity did not occur: Safety/medical concerns  Chair/bed transfer assist level: Independent with assistive device Chair/bed transfer assistive device: Walker, Archivist  Ambulation assist      Assist level: Supervision/Verbal cueing Assistive device: Walker-rolling Max distance: 150+   Walk 10 feet activity   Assist     Assist level: Supervision/Verbal cueing Assistive device: Walker-rolling   Walk 50 feet activity   Assist    Assist level: Supervision/Verbal cueing Assistive device: Walker-rolling    Walk 150 feet activity   Assist Walk 150 feet activity did not occur: Safety/medical concerns (fatigue)  Assist level: Supervision/Verbal cueing Assistive device: Walker-rolling    Walk 10 feet on uneven surface  activity   Assist Walk 10 feet on uneven  surfaces activity did not occur: Safety/medical concerns (fatigue)   Assist level: Supervision/Verbal cueing Assistive device: Walker-rolling   Wheelchair     Assist Is the patient using a wheelchair?: No Type of Wheelchair: Manual    Wheelchair assist level: Dependent - Patient 0%      Wheelchair 50 feet with 2 turns activity    Assist        Assist Level: Dependent - Patient 0%   Wheelchair 150 feet activity     Assist      Assist Level: Dependent - Patient 0%   Blood pressure 120/67, pulse 87, temperature 98.1 F (36.7 C), temperature source Oral, resp. rate 18, height 5' 10 (1.778 m), weight 104.9 kg, SpO2 96%.  Medical Problem List and Plan: 1. Functional deficits secondary to left MCA scattered infarcts with left ICA and left MCA occlusion s/p IR with TICI2c followed by L ICA and basilar tip emboli s/p mechanical thrombectomy TIC3, likely due to hypercoagulable state in setting of cancer             -patient may shower, cover incision/nephrostomy tubes             -ELOS/Goals: 12/17/23.  PT/OT sup to min A, SLP supervision              -pt really wants to go home. dc home today with close medical follow up. He has appointment to exchange nephrostomy tubes Wednesday.  Should see family doctor Friday.    -needs bmet/cbc per [facility] on Thursday 12/22/23. Results can be sent to Dr. Carilyn and PCP   -advised pt to call with any problems or concerns,.  2.  Antithrombotics: -DVT: hx of multiple BL DVT and PE had IVC filter placed 2014  -US  LE positive for age indeterminate DVTs: RT - PopV and PTV LT - CFV, PFV, FV, PopV, Gastrocs  -Probable hypercoagulable state r/t carcinoma  -Pharmaceutical: previously Eliquis  5mg  BID,  heparin  drip transitioned to   coumadin    -appreciate pharmacy monitoring of INR--therapeutic             -antiplatelet therapy: N/A 3. Pain Management: Tylenol  prn  4. Mood/Behavior/Sleep: LCSW to follow for evaluation and support when  available.              -antipsychotic agents: N/A  -melatonin PRN 5. Neuropsych/cognition: This patient may be capable of making decisions on his own behalf. 6. Skin/Wound Care: routine pressure relief measures    7. Fluids/Electrolytes/Nutrition: Monitor I/O  - Per speech past swallow study continue regular diet w/ Ensure and multivitamin supplements-- reordered from prior admission orders   -encouraging po 8/28 K+ 3.6, continue daily K+ supp -12/17/23 colonoscopy. reg diet resumed with ok from Dr. Rollin 8.  Bladder Cancer s/p nephrostomy tubes: stage IV, metastatic to the bone by MRI of the spine on 11/08/2023.  -Hx of prostate CA, on androgen therapy-  leupron q6mos-- [facility] -prior UTI: Ceftriaxone  1g daily completed 8/10   9. AKI: Monitor BMP w/GFR  Cr 1.22--1.42--1.52>> 1.16 on 8/13.   - 8/18 Cr 1.3 today, ?baseline renal insufficiency?  -encourage fluids -12/10/23 Cr 1.19 yesterday, monitor 10.  Anemia: Hemoglobin stable post PRBC 2 units Hgb 9.9-- downtrended during readmission, 9.0--8.5--8.5--7.7--7.6 but up to 9.2 on 8/17.  -12/05/23 Hgb 8.3, FOBT neg. 8/25 hgb essentially holding at 7.9 today  -received 1u prbc on rehab so far -anemia is normocytic. Suspect he has pancytopenia d/t bladder ca. Could be some GI loss related to previously supratherapeutic INR   -avoid supratherapeutic INR's   -recheck stool x 1 (have not received a second sample)   -continue multivitamin,  b complex and add ferrous sulfate   8/29---hgb down to 7.1 yesterday--stool sample + for OB   -pt down for EGD right now. Appreciate GI help   -protonix  on board   -hgb up to 8.1 after 1u PRBC yesterday -I also gave him a dose of aranesp  yesterday as his anemia is likely more than just GI blood loss given cancer dx 12/20/23 Hgb 8.2 today, colonoscopy without definite source  -will need f/u with onc/pcp as outpt 11. HTN now with hypotension: hx of orthostasis which resolved after IV fluids--now  holding home amlodipine  5 mg daily and Carvdilol 12.5 mg - resume as needed. Avoid hypotension -9/1 blood pressures reasonable while still holding meds, no changes   Vitals:   12/17/23 0413 12/17/23 0700 12/17/23 0753 12/17/23 0800  BP: (!) 136/59 (!) 149/72 122/65 122/81   12/17/23 0815 12/17/23 0846 12/17/23 1342 12/17/23 1929  BP: 132/66 118/66 136/66 135/67   12/18/23 0318 12/18/23 1342 12/18/23 1823 12/19/23 0418  BP: 132/60 (!) 121/57 138/78 120/67      13. HLD: LDL 112, goal < 70, atorvastatin  80mg  daily   14. Obesity: Educate on diet and weight loss to promote overall health and mobility.  Body mass index is 33.06 kg/m.   15. Hx of laryngeal cancer s/p radiation therapy, voice sounds mildly hoarse  16. GERD: reordered pepcid  20mg  daily PRN since it appears that's how he was using it; might also warrant Protonix ? Monitor.   17. Elevated LFTs: AST 67/ALT 68, alkphos/bili WNL on 8/9. Related to Ca?  -repeat LFTs  perhaps sl improved. Follow up again 8/25    -consider changing statin    -8/25 need to add LFT's on to this morning's labs 18. Thrombocytopenia/pancytopenia:   -8/29 platelets   89k -> 72k -> 66k ->71k > 72k >74k  -likely d/t metastatic bladder cancer -monitor serially -f/u with oncology as outpt 19. LE edema, L>R  -likely related to albumin /nutritional status. Recent EF 50-55% on ECHO.  -edema is a little increased this morning.    -lasix  20mg  po x1   -check weight today   -addnl kdur given   -elevate legs/compression socks   -push po/proteins   Filed Weights   12/03/23 1555 12/10/23 1555  Weight: 103.2 kg 104.9 kg      LOS: 16 days A FACE TO FACE EVALUATION WAS PERFORMED  Allen Macdonald 12/19/2023, 9:17 AM

## 2023-12-19 NOTE — Progress Notes (Signed)
 Inpatient Rehabilitation Discharge Medication Review by a Pharmacist  A complete drug regimen review was completed for this patient to identify any potential clinically significant medication issues.  High Risk Drug Classes Is patient taking? Indication by Medication  Antipsychotic No   Anticoagulant Yes Warfarin: Hx DVT/PE, stroke ppx  Antibiotic Yes Mupirocin : nasal lesion  Opioid No   Antiplatelet No   Hypoglycemics/insulin No   Vasoactive Medication No   Chemotherapy No   Other Yes Acetaminophen : prn pain Atorvastatin : HLD Pantoprazole , Maalox: GERD Miralax : constipation  Melatonin: sleep B-complex, ferrous sulfate , MVI, KCl: supplement     Type of Medication Issue Identified Description of Issue Recommendation(s)  Drug Interaction(s) (clinically significant)     Duplicate Therapy     Allergy      No Medication Administration End Date     Incorrect Dose     Additional Drug Therapy Needed     Significant med changes from prior encounter (inform family/care partners about these prior to discharge). STOP: apixaban   PTA meds not resumed: amlodipine  Restart or discontinue as appropriate. Communicate medication changes with patient/family at discharge  Other       Clinically significant medication issues were identified that warrant physician communication and completion of prescribed/recommended actions by midnight of the next day:  No  Name of provider notified for urgent issues identified: None  Provider Method of Notification: None   Pharmacist comments: None   Time spent performing this drug regimen review (minutes): 30  Allen Macdonald, PharmD, BCPS Clinical Pharmacist Clinical phone for 12/19/2023 is [phone] 12/19/2023 9:50 AM

## 2023-12-19 NOTE — Discharge Summary (Signed)
 Physician Discharge Summary  Patient ID: Keller Bounds MRN: [id number] DOB/AGE: 75-Mar-1952 75 y.o.  Admit date: 12/03/2023 Discharge date: 12/19/2023  Discharge Diagnoses:  Principal Problem:   CVA (cerebral vascular accident) [facility]) Active Problems:   Heme positive stool   Anticoagulated   History of stroke   Gastric AVM   Discharged Condition: poor  Significant Diagnostic Studies: IR PERCUTANEOUS ART THROMBECTOMY/INFUSION INTRACRANIAL INC DIAG ANGIO Result Date: 12/02/2023 INDICATION: Acute Large Vessel occlusion, simultaneous left carotid terminus and basilar tip emboli EXAM: Mechanical thrombectomy, CONSENT: Consent was obtained from the patient's spouse MEDICATIONS: No additional ANESTHESIA/SEDATION: General CONTRAST:  Sixty-five ml Isovue 370 FLUOROSCOPY: Radiation Exposure Index (as provided by the fluoroscopic device): 311 MGy reference air Kerma COMPLICATIONS: None immediate. TECHNIQUE: Maximal Sterile Barrier Technique was utilized including caps, mask, sterile gowns, sterile gloves, sterile drape, hand hygiene and skin antiseptic. A timeout was performed prior to the initiation of the procedure. FINDINGS: Left carotid: The initial injection demonstrates a normal carotid bifurcation. There is a embolus occluding the carotid terminus with preserved flow in the anterior cerebral artery. The posterior cerebral artery is seen from a posterior communicating artery and is normal. Good collaterals. Left vertebral: There is a embolus in the top of the basilar artery. There is slow flow in the right posterior cerebral artery. PROCEDURE: Femoral access was obtained with ultrasound using direct visualization of the needle puncture into the artery. A 90 cm neuron max sheath was placed. The neuron max was advanced over a penumbra select Simmons 2 into the left internal carotid artery. The SENDIT catheter was advanced over a synchro wire to the level of the occlusion and connected to suction. The  catheter was then withdrawn under aspiration. This resulted in aspiration of the embolus. The follow-up injection showed complete reperfusion of the carotid terminus with normal flow in the anterior and middle cerebral artery and all branches. The neuron max was advanced over a penumbra select parents teen into the left vertebral artery. The SENDIT it catheter was advanced over a synchro wire to the level of the occlusion and connected to suction. The catheter was then withdrawn under aspiration. This resulted in aspiration of the embolus. The follow-up arteriogram now demonstrated normal flow in the basilar artery, both posterior cerebral arteries and all branches. Hemostasis was obtained with Angio-Seal. IMPRESSION: 1. Simultaneous emboli in the left carotid terminus and basilar tip removed with embolectomy PLAN: Patient admitted to the ICU for observation Electronically Signed   By: Nancyann Burns M.D.   On: 12/02/2023 16:00   IR US  Guide Vasc Access Right Result Date: 12/02/2023 INDICATION: Acute Large Vessel occlusion, simultaneous left carotid terminus and basilar tip emboli EXAM: Mechanical thrombectomy, CONSENT: Consent was obtained from the patient's spouse MEDICATIONS: No additional ANESTHESIA/SEDATION: General CONTRAST:  Sixty-five ml Isovue 370 FLUOROSCOPY: Radiation Exposure Index (as provided by the fluoroscopic device): 311 MGy reference air Kerma COMPLICATIONS: None immediate. TECHNIQUE: Maximal Sterile Barrier Technique was utilized including caps, mask, sterile gowns, sterile gloves, sterile drape, hand hygiene and skin antiseptic. A timeout was performed prior to the initiation of the procedure. FINDINGS: Left carotid: The initial injection demonstrates a normal carotid bifurcation. There is a embolus occluding the carotid terminus with preserved flow in the anterior cerebral artery. The posterior cerebral artery is seen from a posterior communicating artery and is normal. Good collaterals. Left  vertebral: There is a embolus in the top of the basilar artery. There is slow flow in the right posterior cerebral artery. PROCEDURE: Femoral access was  obtained with ultrasound using direct visualization of the needle puncture into the artery. A 90 cm neuron max sheath was placed. The neuron max was advanced over a penumbra select Simmons 2 into the left internal carotid artery. The SENDIT catheter was advanced over a synchro wire to the level of the occlusion and connected to suction. The catheter was then withdrawn under aspiration. This resulted in aspiration of the embolus. The follow-up injection showed complete reperfusion of the carotid terminus with normal flow in the anterior and middle cerebral artery and all branches. The neuron max was advanced over a penumbra select parents teen into the left vertebral artery. The SENDIT it catheter was advanced over a synchro wire to the level of the occlusion and connected to suction. The catheter was then withdrawn under aspiration. This resulted in aspiration of the embolus. The follow-up arteriogram now demonstrated normal flow in the basilar artery, both posterior cerebral arteries and all branches. Hemostasis was obtained with Angio-Seal. IMPRESSION: 1. Simultaneous emboli in the left carotid terminus and basilar tip removed with embolectomy PLAN: Patient admitted to the ICU for observation Electronically Signed   By: Nancyann Burns M.D.   On: 12/02/2023 16:00   IR THROMBECT SEC MECH MOD SED Result Date: 12/02/2023 INDICATION: Acute Large Vessel occlusion, simultaneous left carotid terminus and basilar tip emboli EXAM: Mechanical thrombectomy, CONSENT: Consent was obtained from the patient's spouse MEDICATIONS: No additional ANESTHESIA/SEDATION: General CONTRAST:  Sixty-five ml Isovue 370 FLUOROSCOPY: Radiation Exposure Index (as provided by the fluoroscopic device): 311 MGy reference air Kerma COMPLICATIONS: None immediate. TECHNIQUE: Maximal Sterile Barrier  Technique was utilized including caps, mask, sterile gowns, sterile gloves, sterile drape, hand hygiene and skin antiseptic. A timeout was performed prior to the initiation of the procedure. FINDINGS: Left carotid: The initial injection demonstrates a normal carotid bifurcation. There is a embolus occluding the carotid terminus with preserved flow in the anterior cerebral artery. The posterior cerebral artery is seen from a posterior communicating artery and is normal. Good collaterals. Left vertebral: There is a embolus in the top of the basilar artery. There is slow flow in the right posterior cerebral artery. PROCEDURE: Femoral access was obtained with ultrasound using direct visualization of the needle puncture into the artery. A 90 cm neuron max sheath was placed. The neuron max was advanced over a penumbra select Simmons 2 into the left internal carotid artery. The SENDIT catheter was advanced over a synchro wire to the level of the occlusion and connected to suction. The catheter was then withdrawn under aspiration. This resulted in aspiration of the embolus. The follow-up injection showed complete reperfusion of the carotid terminus with normal flow in the anterior and middle cerebral artery and all branches. The neuron max was advanced over a penumbra select parents teen into the left vertebral artery. The SENDIT it catheter was advanced over a synchro wire to the level of the occlusion and connected to suction. The catheter was then withdrawn under aspiration. This resulted in aspiration of the embolus. The follow-up arteriogram now demonstrated normal flow in the basilar artery, both posterior cerebral arteries and all branches. Hemostasis was obtained with Angio-Seal. IMPRESSION: 1. Simultaneous emboli in the left carotid terminus and basilar tip removed with embolectomy PLAN: Patient admitted to the ICU for observation Electronically Signed   By: Nancyann Burns M.D.   On: 12/02/2023 16:00   MR BRAIN WO  CONTRAST Result Date: 11/30/2023 CLINICAL DATA:  73 year old male with 3 episodes of recurrent distal Left ICA/Left MCA ELVO  beginning on 11/20/2023. Status post NIR x three. EXAM: MRI HEAD WITHOUT CONTRAST TECHNIQUE: Multiplanar, multiecho pulse sequences of the brain and surrounding structures were obtained without intravenous contrast. COMPARISON:  Most recent CTA head and neck yesterday. Brain MRI 11/22/2023 and earlier. FINDINGS: Brain: Patchy and widely scattered generally small foci of restricted diffusion in the left MCA territory, pattern not significantly changed compared to 11/22/2023, and extent of diffusion abnormality has mildly regressed since that time. Contralateral right occipital lobe and new small left greater than right cerebellar foci of diffusion restriction are new and/or increased. Brainstem and thalami remain spared. Basal ganglia also remain relatively spared. T2 and FLAIR hyperintense cytotoxic edema associated with the small acute and subacute lesions. Areas of petechial hemorrhage: Heidelberg classification 1a: HI1, scattered small petechiae, no mass effect., that are not significantly changed in the left MCA territory from 11/22/2023. Occasional new punctate microhemorrhages elsewhere, including the bilateral cerebellum and right occipital lobe (series 12, image 27). No malignant hemorrhagic transformation. No midline shift, mass effect, evidence of mass lesion, ventriculomegaly, extra-axial collection. Cervicomedullary junction and pituitary are within normal limits. Vascular: Major intracranial vascular flow voids are preserved, stable compared to 11/22/2023. Skull and upper cervical spine: Stable and negative. Sinuses/Orbits: Stable, mild paranasal sinus fluid and mucosal thickening. Other: Mastoids remain well aerated. Negative visible scalp and face. IMPRESSION: 1. Scattered Left MCA territory infarcts, with a pattern similar to 11/22/2023, and extent mildly regressed since that  time. Stable Heidelberg classification 1a: HI1 petechial hemorrhage. 2. New/increased scattered and generally punctate infarcts in the right occipital and bilateral cerebellar hemispheres. Several new micro-hemorrhages in those areas also. 3. No malignant hemorrhagic transformation. No intracranial mass effect. Electronically Signed   By: VEAR Hurst M.D.   On: 11/30/2023 05:21   CT ANGIO HEAD NECK W WO CM (CODE STROKE) Result Date: 11/29/2023 CLINICAL DATA:  Neuro deficit, acute, stroke suspected. Aphasia, right facial droop, and right arm weakness. EXAM: CT ANGIOGRAPHY HEAD AND NECK WITH AND WITHOUT CONTRAST TECHNIQUE: Multidetector CT imaging of the head and neck was performed using the standard protocol during bolus administration of intravenous contrast. Multiplanar CT image reconstructions and MIPs were obtained to evaluate the vascular anatomy. Carotid stenosis measurements (when applicable) are obtained utilizing NASCET criteria, using the distal internal carotid diameter as the denominator. RADIATION DOSE REDUCTION: This exam was performed according to the departmental dose-optimization program which includes automated exposure control, adjustment of the mA and/or kV according to patient size and/or use of iterative reconstruction technique. CONTRAST:  75mL OMNIPAQUE  IOHEXOL  350 MG/ML SOLN COMPARISON:  CTA head and neck 11/21/2023.  MRA head 11/22/2023. FINDINGS: CTA NECK FINDINGS Aortic arch: Normal variant aortic arch branching pattern with common origin of the brachiocephalic and left common carotid arteries. Widely patent arch vessel origins. Right carotid system: Patent with minimal calcified and soft plaque at the carotid bifurcation. No evidence of a significant stenosis or dissection. Left carotid system: Patent with a small amount of calcified and soft plaque at the carotid bifurcation. No evidence of a significant stenosis or dissection. Vertebral arteries: Patent without evidence of a significant  stenosis or dissection. Dominant left vertebral artery. Skeleton: Moderate to severe disc and facet degeneration in the cervical spine. Severe bilateral neural foraminal stenosis and at least mild spinal stenosis at C5-6 and C6-7. Other neck: No evidence of cervical lymphadenopathy or mass. Upper chest: Clear lung apices. Review of the MIP images confirms the above findings CTA HEAD FINDINGS Anterior circulation: The intracranial right internal carotid artery  is patent with atherosclerosis resulting in at most mild siphon stenosis. The intracranial left ICA is patent proximally with nonstenotic atherosclerosis in the cavernous segment, however there is a new/recurrent thrombus in the ICA terminus resulting in severe stenosis. This involves the left M1 origin which appears occluded over a 3 mm long segment with reconstitution of the distal M1 segment and proximal M2 trunks. However, there is occlusion of multiple mid M2 and more distal left MCA branches in the posterior division. The right MCA and both ACAs are patent with diffuse branch vessel irregularity as well as asymmetric attenuation of distal right ACA branch vessels compared to the left. The right A1 segment is hypoplastic. No aneurysm is identified. Posterior circulation: The intracranial left vertebral artery is strongly dominant widely patent. The intracranial right vertebral artery is hypoplastic with moderate diffuse irregularity as well as an apparent severe stenosis distally which appears increased compared to the prior CTA, although the degree of stenosis may be accentuated by small vessel size and skull base artifact. The basilar artery is widely patent proximally, however there is a new severe stenosis or near occlusive thrombus at its tip involving the origins of both PCAs which remain patent. There is a patent left posterior communicating artery. No aneurysm is identified. Venous sinuses: As permitted by contrast timing, patent. Anatomic  variants: Hypoplastic right A1 segment. Review of the MIP images confirms the above findings Findings discussed by telephone with Dr. Lindzen on 11/29/2023 at 5:01 p.m. IMPRESSION: 1. New/recurrent thrombus in the left ICA terminus with occlusion of the left M1 origin. Reconstitution of the distal left M1 segment, however there are multiple occluded mid M2 and more distal branches. 2. New severe stenosis or near occlusive thrombus at the basilar tip involving the origins of both PCAs. 3. Increased, severe stenosis of the distal V4 segment of the non-dominant right vertebral artery. 4. Mild cervical carotid atherosclerosis without significant stenosis. Electronically Signed   By: Dasie Hamburg M.D.   On: 11/29/2023 17:46   CT HEAD CODE STROKE WO CONTRAST Result Date: 11/29/2023 CLINICAL DATA:  Code stroke. Neuro deficit, acute, stroke suspected. Slurred speech, right facial droop, and right arm weakness. EXAM: CT HEAD WITHOUT CONTRAST TECHNIQUE: Contiguous axial images were obtained from the base of the skull through the vertex without intravenous contrast. RADIATION DOSE REDUCTION: This exam was performed according to the departmental dose-optimization program which includes automated exposure control, adjustment of the mA and/or kV according to patient size and/or use of iterative reconstruction technique. COMPARISON:  Head CT 11/21/2023 and MRI 11/22/2023 FINDINGS: Brain: Scattered small hypodensities involving cortex and subcortical white matter in the left frontal and parietal lobes, left insula, and left basal ganglia correspond to recent infarcts on MRI, with many of the smaller infarcts on MRI being occult by CT. No acute large territory infarct, intracranial hemorrhage, mass, midline shift, or extra-axial fluid collection is identified. There is a background of mild chronic small vessel ischemia in the cerebral white matter. Cerebral volume is within normal limits for age. The ventricles are normal in  size. Vascular: Calcified atherosclerosis at the skull base. No hyperdense vessel. Skull: No fracture or suspicious lesion. Sinuses/Orbits: Mucosal thickening in the right sphenoid sinus. Clear mastoid air cells. Bilateral cataract extraction. Other: None. ASPECTS [facility] Stroke Program Early CT Score) - Ganglionic level infarction (caudate, lentiform nuclei, internal capsule, insula, M1-M3 cortex): 7 (allowing for the known subacute left MCA infarcts) - Supraganglionic infarction (M4-M6 cortex): 3 Total score (0-10 with 10 being normal):  10 Findings discussed by telephone with Dr. Lindzen on 11/29/2023 at 5:01 p.m. IMPRESSION: Evolving subacute left MCA infarcts. No definite acute infarct or intracranial hemorrhage. Electronically Signed   By: Dasie Hamburg M.D.   On: 11/29/2023 17:05  VAS US  TRANSCRANIAL DOPPLER W BUBBLES Result Date: 11/22/2023  Transcranial Doppler with Bubble Patient Name:  ANDER WAMSER  Date of Exam:   11/22/2023 Medical Rec #: [id number]        Accession #:    [id number] Date of Birth: 08/03/1950        Patient Gender: M Patient Age:   2 years Exam Location:  [facility] Procedure:      VAS US  TRANSCRANIAL DOPPLER W BUBBLES Referring Phys: ARY XU --------------------------------------------------------------------------------  Indications: Embolic stroke, positive for DVT. History: Bladder cancer, on Eliquis . Comparison Study: No prior TCD Performing Technologist: Alberta Lis RVS  Examination Guidelines: A complete evaluation includes B-mode imaging, spectral Doppler, color Doppler, and power Doppler as needed of all accessible portions of each vessel. Bilateral testing is considered an integral part of a complete examination. Limited examinations for reoccurring indications may be performed as noted.  Summary: No HITS at rest or during Valsalva. Negative transcranial Doppler Bubble study with no evidence of right to left intracardiac communication.  A vascular evaluation was  performed. The right middle cerebral artery was studied. An IV was inserted into the patient's left AC. Verbal informed consent was obtained.  Diagnosing physician: ARY Cummins MD Electronically signed by ARY Cummins MD on 11/22/2023 at 2:42:49 PM.    Final    ECHOCARDIOGRAM COMPLETE Result Date: 11/22/2023    ECHOCARDIOGRAM REPORT   Patient Name:   EDWIN BAINES Date of Exam: 11/22/2023 Medical Rec #:  [id number]       Height:       70.0 in Accession #:    [id number]      Weight:       230.4 lb Date of Birth:  10/01/1950       BSA:          2.217 m Patient Age:    73 years        BP:           138/65 mmHg Patient Gender: M               HR:           75 bpm. Exam Location:  Inpatient Procedure: 2D Echo, 3D Echo, Cardiac Doppler, Color Doppler and Strain Analysis            (Both Spectral and Color Flow Doppler were utilized during            procedure). Indications:    Stroke  History:        Patient has prior history of Echocardiogram examinations, most                 recent 02/11/2023. Cardiomyopathy, Signs/Symptoms:Dyspnea; Risk                 Factors:Hypertension. History of pulmonary embolism.  Sonographer:    Philomena Daring Referring Phys: [id number] ROCKY BROCKS LEHNER IMPRESSIONS  1. Posterior lateral wall hypokinesis . Left ventricular ejection fraction, by estimation, is 50 to 55%. The left ventricle has low normal function. The left ventricle has no regional wall motion abnormalities. Left ventricular diastolic parameters were  normal.  2. Right ventricular systolic function is normal. The right ventricular size is normal.  3. Left atrial size was mildly dilated.  4. The mitral valve is abnormal. Moderate mitral valve regurgitation. No evidence of mitral stenosis. Moderate mitral annular calcification.  5. The aortic valve is tricuspid. There is mild calcification of the aortic valve. There is mild thickening of the aortic valve. Aortic valve regurgitation is not visualized. Aortic valve sclerosis is present, with  no evidence of aortic valve stenosis.  6. The inferior vena cava is normal in size with greater than 50% respiratory variability, suggesting right atrial pressure of 3 mmHg.  7. Agitated saline contrast bubble study was negative, with no evidence of any interatrial shunt. FINDINGS  Left Ventricle: Posterior lateral wall hypokinesis. Left ventricular ejection fraction, by estimation, is 50 to 55%. The left ventricle has low normal function. The left ventricle has no regional wall motion abnormalities. Strain was performed and the global longitudinal strain is indeterminate. The left ventricular internal cavity size was normal in size. There is no left ventricular hypertrophy. Left ventricular diastolic parameters were normal. Right Ventricle: The right ventricular size is normal. No increase in right ventricular wall thickness. Right ventricular systolic function is normal. Left Atrium: Left atrial size was mildly dilated. Right Atrium: Right atrial size was normal in size. Pericardium: There is no evidence of pericardial effusion. Mitral Valve: The mitral valve is abnormal. There is mild thickening of the mitral valve leaflet(s). There is mild calcification of the mitral valve leaflet(s). Moderate mitral annular calcification. Moderate mitral valve regurgitation. No evidence of mitral valve stenosis. Tricuspid Valve: The tricuspid valve is normal in structure. Tricuspid valve regurgitation is not demonstrated. No evidence of tricuspid stenosis. Aortic Valve: The aortic valve is tricuspid. There is mild calcification of the aortic valve. There is mild thickening of the aortic valve. Aortic valve regurgitation is not visualized. Aortic valve sclerosis is present, with no evidence of aortic valve stenosis. Pulmonic Valve: The pulmonic valve was normal in structure. Pulmonic valve regurgitation is not visualized. No evidence of pulmonic stenosis. Aorta: The aortic root is normal in size and structure. Venous: The  inferior vena cava is normal in size with greater than 50% respiratory variability, suggesting right atrial pressure of 3 mmHg. IAS/Shunts: No atrial level shunt detected by color flow Doppler. Agitated saline contrast was given intravenously to evaluate for intracardiac shunting. Agitated saline contrast bubble study was negative, with no evidence of any interatrial shunt. Additional Comments: 3D was performed not requiring image post processing on an independent workstation and was indeterminate.  LEFT VENTRICLE PLAX 2D LVIDd:         5.70 cm      Diastology LVIDs:         3.90 cm      LV e' medial:    8.59 cm/s LV PW:         1.10 cm      LV E/e' medial:  9.9 LV IVS:        1.10 cm      LV e' lateral:   12.00 cm/s LVOT diam:     1.70 cm      LV E/e' lateral: 7.1 LV SV:         42 LV SV Index:   19 LVOT Area:     2.27 cm  LV Volumes (MOD) LV vol d, MOD A2C: 113.0 ml LV vol d, MOD A4C: 144.0 ml LV vol s, MOD A2C: 43.8 ml LV vol s, MOD A4C: 51.4 ml LV SV MOD A2C:     69.2 ml LV SV MOD A4C:  144.0 ml LV SV MOD BP:      79.8 ml RIGHT VENTRICLE             IVC RV S prime:     14.50 cm/s  IVC diam: 1.20 cm TAPSE (M-mode): 3.3 cm LEFT ATRIUM             Index        RIGHT ATRIUM           Index LA diam:        3.80 cm 1.71 cm/m   RA Area:     14.20 cm LA Vol (A2C):   66.3 ml 29.91 ml/m  RA Volume:   29.20 ml  13.17 ml/m LA Vol (A4C):   45.9 ml 20.71 ml/m LA Biplane Vol: 57.3 ml 25.85 ml/m  AORTIC VALVE LVOT Vmax:   81.80 cm/s LVOT Vmean:  56.600 cm/s LVOT VTI:    0.185 m  AORTA Ao Root diam: 2.60 cm MITRAL VALVE MV Area (PHT): 3.85 cm    SHUNTS MV Decel Time: 197 msec    Systemic VTI:  0.18 m MV E velocity: 85.40 cm/s  Systemic Diam: 1.70 cm MV A velocity: 90.10 cm/s MV E/A ratio:  0.95 Maude Emmer MD Electronically signed by Maude Emmer MD Signature Date/Time: 11/22/2023/9:27:45 AM    Final    VAS US  LOWER EXTREMITY VENOUS (DVT) Result Date: 11/22/2023  Lower Venous DVT Study Patient Name:  COLBI STAUBS   Date of Exam:   11/21/2023 Medical Rec #: [id number]        Accession #:    [id number] Date of Birth: February 28, 1951        Patient Gender: M Patient Age:   42 years Exam Location:  [facility] Procedure:      VAS US  LOWER EXTREMITY VENOUS (DVT) Referring Phys: ARY XU --------------------------------------------------------------------------------  Indications: Stroke.  Risk Factors: Cancer (bladder). Anticoagulation: Eliquis . Limitations: Bandages, poor ultrasound/tissue interface and patient immobility. Comparison Study: Previous RLEV on 09/07/2023 was positive for DVT (CFV, FV, PFV,                   PopV, PTV) Previous BLEV 06/27/2023 was negative for DVT,                   however exam was limited. Performing Technologist: Ezzie Potters RVT, RDMS  Examination Guidelines: A complete evaluation includes B-mode imaging, spectral Doppler, color Doppler, and power Doppler as needed of all accessible portions of each vessel. Bilateral testing is considered an integral part of a complete examination. Limited examinations for reoccurring indications may be performed as noted. The reflux portion of the exam is performed with the patient in reverse Trendelenburg.  +---------+---------------+---------+-----------+----------+-------------------+ RIGHT    CompressibilityPhasicitySpontaneityPropertiesThrombus Aging      +---------+---------------+---------+-----------+----------+-------------------+ CFV                                                   Unable to visualize +---------+---------------+---------+-----------+----------+-------------------+ SFJ                                                   Unable to visualize +---------+---------------+---------+-----------+----------+-------------------+ FV Prox  Full  Yes      Yes                                      +---------+---------------+---------+-----------+----------+-------------------+ FV Mid   Full           Yes      Yes                                       +---------+---------------+---------+-----------+----------+-------------------+ FV DistalFull           Yes      Yes                                      +---------+---------------+---------+-----------+----------+-------------------+ PFV      Full                                                             +---------+---------------+---------+-----------+----------+-------------------+ POP      None           No       No                   Age Indeterminate   +---------+---------------+---------+-----------+----------+-------------------+ PTV      None           No       No                   Age Indeterminate   +---------+---------------+---------+-----------+----------+-------------------+ PERO     Full                                                             +---------+---------------+---------+-----------+----------+-------------------+   Right Technical Findings: Not visualized segments include CFV, SFJ - pressure dressing from thrombectomy. Popliteal vein not well visualized due to immobility  +---------+---------------+---------+-----------+----------+-----------------+ LEFT     CompressibilityPhasicitySpontaneityPropertiesThrombus Aging    +---------+---------------+---------+-----------+----------+-----------------+ CFV      None           No       No                   Age Indeterminate +---------+---------------+---------+-----------+----------+-----------------+ SFJ      Partial                                                        +---------+---------------+---------+-----------+----------+-----------------+ FV Prox  None           No       Yes                  Age Indeterminate +---------+---------------+---------+-----------+----------+-----------------+ FV Mid   None           No  No                   Age Indeterminate  +---------+---------------+---------+-----------+----------+-----------------+ FV DistalNone           No       No                   Age Indeterminate +---------+---------------+---------+-----------+----------+-----------------+ PFV      Partial        No       Yes                  Age Indeterminate +---------+---------------+---------+-----------+----------+-----------------+ POP      None           No       No                   Age Indeterminate +---------+---------------+---------+-----------+----------+-----------------+ PTV      Full                                                           +---------+---------------+---------+-----------+----------+-----------------+ PERO     Full                                                           +---------+---------------+---------+-----------+----------+-----------------+ Gastroc  None           No       No                   Age Indeterminate +---------+---------------+---------+-----------+----------+-----------------+ EIV      Full           Yes      Yes                                    +---------+---------------+---------+-----------+----------+-----------------+     Summary: RIGHT: - Findings consistent with age indeterminate deep vein thrombosis involving the right posterior tibial veins, and right popliteal vein.   LEFT: - Findings consistent with age indeterminate deep vein thrombosis involving the left common femoral vein, SF junction, left femoral vein, left proximal profunda vein, and left popliteal vein. Findings consistent with age indeterminate intramuscular thrombus involving the left gastrocnemius veins.  *See table(s) above for measurements and observations. Electronically signed by Norman Serve on 11/22/2023 at 9:20:21 AM.    Final    MR ANGIO HEAD WO CONTRAST Result Date: 11/22/2023 EXAM: MR Angiography Head without intravenous Contrast. 11/22/2023 05:40:30 AM TECHNIQUE: Magnetic resonance  angiography images of the head without intravenous contrast. Multiplanar 2D and 3D reformatted images are provided for review. COMPARISON: CT angio head and neck 11/21/2023. Cerebral arteriogram 11/21/2023. CLINICAL HISTORY: Stroke, follow up. FINDINGS: ANTERIOR CIRCULATION: A left A1 segment is dominant. The anterior communicating artery is patent. The left MCA branch vessels are within normal limits on this study. No aneurysm. POSTERIOR CIRCULATION: A left vertebral artery is a dominant vessel. No aneurysm. IMPRESSION: 1. No significant stenosis of the intracranial vasculature. 2. No aneurysm. Electronically signed by: Lonni Necessary MD 11/22/2023 06:02 AM EDT RP Workstation: [id number]  MR BRAIN WO CONTRAST Result Date: 11/22/2023 EXAM: MRI BRAIN WITHOUT CONTRAST 11/22/2023 05:40:00 AM TECHNIQUE: Multiplanar multisequence MRI of the head/brain was performed without the administration of intravenous contrast. COMPARISON: None available. CLINICAL HISTORY: Stroke, follow up. Left MCA dissection. FINDINGS: BRAIN AND VENTRICLES: Scattered foci of restricted diffusion are present throughout the left MCA territory consistent with cortical and subcortical infarcts. Partial infarcts of the left caudate head and lentiform nuclei are present. T2 and FLAIR signal changes are consistent with the subacute timeframe of the infarcts. Susceptibility weighted images demonstrate signal change within the large cortical lesions and in the left caudate head lesion consistent with petechial hemorrhage. Cortical thickening is present without other significant mass effect. Periventricular and subcortical T2 hyperintensities are otherwise mildly advanced for age. The sella is unremarkable. Normal flow voids. ORBITS: Bilateral lens replacements are noted. The globes and orbits are otherwise within normal limits. SINUSES AND MASTOIDS: Minimal fluid is present in the right maxillary sinus. Small bilateral mastoid effusions are  present. BONES AND SOFT TISSUES: Normal marrow signal. No acute soft tissue abnormality. IMPRESSION: 1. Scattered foci of restricted diffusion throughout the left MCA territory consistent with cortical and subcortical infarcts, including partial infarcts of the left caudate head and lentiform nuclei. Findings are consistent with the subacute timeframe. 2. Petechial hemorrhage noted within the largest cortical lesions and the left caudate head. 3. Cortical thickening without other significant mass effect. 4. Mildly advanced periventricular and subcortical T2 hyperintensities for age. 5. Minimal fluid in the right maxillary sinus and small bilateral mastoid effusions. Electronically signed by: Lonni Necessary MD 11/22/2023 05:54 AM EDT RP Workstation: [id number]   IR PERCUTANEOUS ART THROMBECTOMY/INFUSION INTRACRANIAL INC DIAG ANGIO Result Date: 11/21/2023 INDICATION: Acute Large Vessel occlusion, left middle cerebral artery occlusion EXAM: Mechanical thrombectomy, CONSENT: Emergency consent was obtained from the patient's wife as the patient was unable to consent for themselves and family not immediately available. Dr. Ary Cummins and I agreed the procedure was emergent. MEDICATIONS: Intravenous Cangrelor , intra arterial verapamil  5 mg ANESTHESIA/SEDATION: General CONTRAST:  Forty ml Isovue 370 FLUOROSCOPY: Fluoroscopy time: 3 minutes 54 seconds Radiation Exposure Index (as provided by the fluoroscopic device): 72.05 MGy reference air Kerma COMPLICATIONS: None immediate. TECHNIQUE: Maximal Sterile Barrier Technique was utilized including caps, mask, sterile gowns, sterile gloves, sterile drape, hand hygiene and skin antiseptic. A timeout was performed prior to the initiation of the procedure. Vessel catheterized: Left common carotid, left internal carotid FINDINGS: The carotid bifurcation is normal. There is an occlusion of the left middle cerebral artery. The anterior cerebral artery is normal. Good  collaterals. PROCEDURE: Femoral access was obtained with ultrasound using direct visualization of the needle puncture into the artery. A 90 cm neuron max sheath was placed. The non MAC she was advanced over a 5 [place] penumbra select parents teen into the left internal carotid artery. The read 72 SENDIT catheter was advanced over a synchro support wire to the level of the occlusion and connected to suction. The catheter was then withdrawn under aspiration. This resulted in removal of the embolus. The follow-up arteriogram demonstrated a intimal flap within the left middle cerebral artery M1 segment. 5 mg of verapamil  were administered intra-arterially. A repeat image was obtained which clearly demonstrates a dissection flap in the distal middle cerebral artery M1 segment. It appears to extend to both the inferior and superior divisions of the middle cerebral artery. The artery was observed for about 20 minutes after initiation of Cangrelor . There was no slowing of the flow. All  of the proximal branches were patent, there was slow flow distally within several of the branches involving both the anterior and posterior middle cerebral artery divisions. I did not think the stent would likely improve the situation and would risk thrombosis of the artery and potentially 1 of the 2 divisions. Hemostasis was obtained with Angio-Seal. 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 Flow was restored with aspiration thrombectomy. Initiation Cangrelor  in attempt to maintain vessel patency PLAN: Return to ICU. Continue intravenous Cangrelor  24 hours until repeat imaging Electronically Signed   By: Nancyann Burns M.D.   On: 11/21/2023 15:02   IR US  Guide Vasc Access Right Result Date: 11/21/2023 INDICATION: Acute Large Vessel occlusion, left middle cerebral artery occlusion EXAM: Mechanical thrombectomy, CONSENT: Emergency consent was obtained from the patient's wife  as the patient was unable to consent for themselves and family not immediately available. Dr. Ary Cummins and I agreed the procedure was emergent. MEDICATIONS: Intravenous Cangrelor , intra arterial verapamil  5 mg ANESTHESIA/SEDATION: General CONTRAST:  Forty ml Isovue 370 FLUOROSCOPY: Fluoroscopy time: 3 minutes 54 seconds Radiation Exposure Index (as provided by the fluoroscopic device): 72.05 MGy reference air Kerma COMPLICATIONS: None immediate. TECHNIQUE: Maximal Sterile Barrier Technique was utilized including caps, mask, sterile gowns, sterile gloves, sterile drape, hand hygiene and skin antiseptic. A timeout was performed prior to the initiation of the procedure. Vessel catheterized: Left common carotid, left internal carotid FINDINGS: The carotid bifurcation is normal. There is an occlusion of the left middle cerebral artery. The anterior cerebral artery is normal. Good collaterals. PROCEDURE: Femoral access was obtained with ultrasound using direct visualization of the needle puncture into the artery. A 90 cm neuron max sheath was placed. The non MAC she was advanced over a 5 [place] penumbra select parents teen into the left internal carotid artery. The read 72 SENDIT catheter was advanced over a synchro support wire to the level of the occlusion and connected to suction. The catheter was then withdrawn under aspiration. This resulted in removal of the embolus. The follow-up arteriogram demonstrated a intimal flap within the left middle cerebral artery M1 segment. 5 mg of verapamil  were administered intra-arterially. A repeat image was obtained which clearly demonstrates a dissection flap in the distal middle cerebral artery M1 segment. It appears to extend to both the inferior and superior divisions of the middle cerebral artery. The artery was observed for about 20 minutes after initiation of Cangrelor . There was no slowing of the flow. All of the proximal branches were patent, there was slow flow distally  within several of the branches involving both the anterior and posterior middle cerebral artery divisions. I did not think the stent would likely improve the situation and would risk thrombosis of the artery and potentially 1 of the 2 divisions. Hemostasis was obtained with Angio-Seal. 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 Flow was restored with aspiration thrombectomy. Initiation Cangrelor  in attempt to maintain vessel patency PLAN: Return to ICU. Continue intravenous Cangrelor  24 hours until repeat imaging Electronically Signed   By: Nancyann Burns M.D.   On: 11/21/2023 15:02   CT ANGIO HEAD NECK W WO CM Result Date: 11/21/2023 EXAM: CTA HEAD AND NECK WITH AND WITHOUT 11/21/2023 01:49:04 PM TECHNIQUE: CTA of the head and neck was performed with and without the administration of intravenous contrast. Multiplanar 2D and/or 3D reformatted images are provided for review. Automated exposure control, iterative reconstruction,  and/or weight based adjustment of the mA/kV was utilized to reduce the radiation dose to as low as reasonably achievable. Stenosis of the internal carotid arteries measured using NASCET criteria. COMPARISON: CT angiogram of the head and neck and CT perfusion of the head dated 11/20/2000. CLINICAL HISTORY: Stroke/TIA, determine embolic source. FINDINGS: CTA NECK: AORTIC ARCH AND ARCH VESSELS: Minimal calcific atheromatous disease within the aortic arch. No dissection or arterial injury. No significant stenosis of the brachiocephalic or subclavian arteries. CERVICAL CAROTID ARTERIES: Mild calcific/noncalcific plaque within the origin of the right internal carotid artery, with no appreciable stenosis. Mild calcific plaque within the origin of the left internal carotid artery, also with no significant stenosis. No dissection or arterial injury. CERVICAL VERTEBRAL ARTERIES: The left vertebral artery is dominant. Both vertebral  arteries are normal in caliber throughout their respective courses. No dissection, arterial injury, or significant stenosis. LUNGS AND MEDIASTINUM: Unremarkable. SOFT TISSUES: No acute abnormality. BONES: No acute abnormality. CTA HEAD: ANTERIOR CIRCULATION: Mild generalized cerebral volume loss and mild periventricular white matter disease. Calcification within the carotid siphons bilaterally and mild diffuse stenosis of the clinoid and cavernous segments of the right internal carotid artery. The endoluminal filling defect previously seen at the left ICA terminus is no longer present. The M1 segment of the left middle cerebral artery has recanalized in the interim and demonstrates moderate stenosis distally. It is also diffusely irregular. The M2 branches are patent. The right A1 segment is diminutive and diffusely irregular. No aneurysm. POSTERIOR CIRCULATION: No significant stenosis of the posterior cerebral arteries. No significant stenosis of the basilar artery. No significant stenosis of the vertebral arteries. No aneurysm. OTHER: No dural venous sinus thrombosis on this non-dedicated study. IMPRESSION: 1. No large vessel occlusion or aneurysm in the head or neck. There has been interval recanalization of the M1 segment of the left middle cerebral artery. 2. Moderate stenosis distally in the recanalized M1 segment of the left middle cerebral artery, which is also diffusely irregular. Electronically signed by: evalene coho 11/21/2023 02:39 PM EDT RP Workstation: [id number]   CT ANGIO HEAD NECK W WO CM W PERF Result Date: 11/21/2023 EXAM: CTA Head and Neck with Perfusion 11/21/2023 10:14:48 AM TECHNIQUE: CTA of the head and neck was performed with the administration of intravenous contrast. 3D postprocessing with multiplanar reconstructions and MIPs was performed to evaluate the vascular anatomy. Automated exposure control, iterative reconstruction, and/or weight based adjustment of the mA/kV was utilized  to reduce the radiation dose to as low as reasonably achievable. COMPARISON: CT angiogram of the head and neck dated 11/20/2023. CLINICAL HISTORY: Post thrombectomy worsening aphasia and right arm weakness. FINDINGS: CTA NECK: AORTIC ARCH AND ARCH VESSELS: No dissection or arterial injury. No significant stenosis of the brachiocephalic or subclavian arteries. CERVICAL CAROTID ARTERIES: Mild calcific plaque present within the origin of the left internal carotid artery, with no luminal stenosis. CERVICAL VERTEBRAL ARTERIES: No dissection, arterial injury, or significant stenosis. LUNGS AND MEDIASTINUM: Unremarkable. SOFT TISSUES: No acute abnormality. BONES: No acute abnormality. CTA HEAD: ANTERIOR CIRCULATION: Calcific plaque present within the carotid siphons bilaterally. There is an endoluminal filling defect within the left ICA terminus. The left M1 branch is completely occluded at its origin. There is reconstitution of the M2 branches via collaterals. The left A1 is widely patent. POSTERIOR CIRCULATION: No significant stenosis of the posterior cerebral arteries. No significant stenosis of the basilar artery. No significant stenosis of the vertebral arteries. No aneurysm. OTHER: No dural venous sinus thrombosis on this non-dedicated study.  CT PERFUSION: EXAM QUALITY: Exam quality is adequate with diagnostic perfusion maps. No significant motion artifact. Appropriate arterial inflow and venous outflow curves. CORE INFARCT (CBF<30% volume): 4 mL TOTAL HYPOPERFUSION (Tmax>6s volume): 47 mL PENUMBRA: Mismatch volume: 43 mL Mismatch ratio: 11.8 Location: The infarct core is within the left temporal lobe and the penumbra involves the temporal, frontal and parietal lobes. IMPRESSION: 1. Left M1 branch occlusion at its origin with reconstitution of the M2 branches via collaterals. 2. Infarct core within the left temporal lobe and penumbra involving the temporal, frontal, and parietal lobes. 3. The above findings were  communicated to ordering clinician Karna Geralds via Amion paging at 10:51 AM 11/21/2023. Electronically signed by: evalene coho 11/21/2023 10:53 AM EDT RP Workstation: [id number]   MR BRAIN WO CONTRAST Result Date: 11/21/2023 EXAM: MRI BRAIN WITHOUT CONTRAST 11/21/2023 01:09:07 AM TECHNIQUE: Multiplanar multisequence MRI of the head/brain was performed without the administration of intravenous contrast. COMPARISON: None available. CLINICAL HISTORY: Stroke, follow up. FINDINGS: BRAIN AND VENTRICLES: There are multiple scattered small foci of abnormal diffusion restriction indicating acute ischemia within the left MCA (middle cerebral artery) territory, greatest in the frontal lobe. Additionally, there are punctate foci of acute ischemia in the right occipital lobe and right frontal white matter. Multifocal hyperintense T2-weighted signal within the cerebral white matter, most commonly due to chronic small vessel disease. No intracranial hemorrhage. No mass. No midline shift. No hydrocephalus. The sella is unremarkable. Normal flow voids. ORBITS: No acute abnormality. SINUSES AND MASTOIDS: No acute abnormality. BONES AND SOFT TISSUES: Normal marrow signal. No acute soft tissue abnormality. IMPRESSION: 1. Multiple scattered small foci of acute ischemia within the left MCA territory, greatest in the frontal lobe. 2. Punctate foci of acute ischemia in the right occipital lobe and right frontal white matter. 3. Multifocal hyperintense T2-weighted signal within the cerebral white matter, most commonly due to chronic small vessel disease. Electronically signed by: Franky Stanford MD 11/21/2023 01:43 AM EDT RP Workstation: [id number]   IR PERCUTANEOUS ART THROMBECTOMY/INFUSION INTRACRANIAL INC DIAG ANGIO Result Date: 11/20/2023 PROCEDURE PERFORMED: 1. Cerebral angiography with stroke thrombectomy 2. Ultrasound guided vascular access 3. Cone beam CT for treatment planning COMPARISON:  CT angiogram of the head neck  performed November 20, 2023 CLINICAL DATA:  73 year old male with acute ischemic stroke with symptoms of aphasia and right-sided weakness. NIH stroke scale measures 24. A multi disciplinary review was performed and the patient was offered stroke thrombectomy. INDICATION: Acute ischemic stroke. ANESTHESIA/SEDATION: General anesthesia was utilized for the procedure. CONTRAST:  Approximately 50 cc Ominipque 300 MEDICATIONS: Heparin  5000 units intravenously FLUOROSCOPY TIME:  Fluoroscopy Time: 12 minutes, (964 mGy). COMPLICATIONS: None immediate. BODY OF REPORT: Following a full explanation of the procedure along with the potential associated complications, an informed witnessed consent was obtained. The patient was then placed under general anesthesia by the Department of Anesthesiology at [facility]. The right femoral access site was prepped and draped in the usual sterile fashion. Ultrasound was used to study the right common femoral artery which was patent. Using real-time ultrasound guidance, a 19 gauge introducer needle was used to access the right common femoral artery. Access was performed at 1054 a.m. A hard copy image ultrasound the saved and stored in PACS. Using this access, a 6 French sheath was placed in the descending thoracic aorta. Approximately 5000 units of heparin  was administered intravenously. Next, selective catheterization the left internal carotid artery was performed. A selective arteriogram was performed which demonstrated a carotid terminus occlusion with partial  perfusion of the ACA and complete malperfusion of the MCA territory. Pretreatment TICI score 1. Next, a penumbra 43 and red 68 catheter was used to selectively catheterize the left M1 segment. The first pass was performed at 1107. This did not achieve recanalization. A second pass was performed with a EMBO trap device position in the left M1 segment at 11:13. This did not result in reperfusion. A third pass was performed with  the EMBO trap position in the left M1 segment at 11:20. This did not result in reperfusion. At this point, I transitioned to a zoom 71 catheter and solitaire device. The solitaire device was deployed in the left M1 segment at 11:34. This pass resulted in recanalization of the left MCA territory. No residual large vessel occlusion was observed. Slow flow was observed in a few small posterior parietal branches. Post treatment TICI score 2 C. After reviewing the imaging, I elected to terminate the procedure at this point. Evaluation of the right femoral access site demonstrated that the site was suitable for a closure device. A 6 French Angio-Seal device was deployed without complication. Cone beam CT was then performed to evaluate for intracranial hemorrhage and treatment planning. This demonstrated no evidence of significant acute intracranial hemorrhage. The patient was removed from anesthesia and transferred to recovery in stable condition. IMPRESSION: 1. Suction mechanical thrombectomy of left carotid terminus and left M1 occlusion. PLAN: 1. To ICU for routine postoperative supportive care. Electronically Signed   By: Maude Naegeli M.D.   On: 11/20/2023 12:37   IR US  Guide Vasc Access Right Result Date: 11/20/2023 PROCEDURE PERFORMED: 1. Cerebral angiography with stroke thrombectomy 2. Ultrasound guided vascular access 3. Cone beam CT for treatment planning COMPARISON:  CT angiogram of the head neck performed November 20, 2023 CLINICAL DATA:  73 year old male with acute ischemic stroke with symptoms of aphasia and right-sided weakness. NIH stroke scale measures 24. A multi disciplinary review was performed and the patient was offered stroke thrombectomy. INDICATION: Acute ischemic stroke. ANESTHESIA/SEDATION: General anesthesia was utilized for the procedure. CONTRAST:  Approximately 50 cc Ominipque 300 MEDICATIONS: Heparin  5000 units intravenously FLUOROSCOPY TIME:  Fluoroscopy Time: 12 minutes, (964 mGy).  COMPLICATIONS: None immediate. BODY OF REPORT: Following a full explanation of the procedure along with the potential associated complications, an informed witnessed consent was obtained. The patient was then placed under general anesthesia by the Department of Anesthesiology at [facility]. The right femoral access site was prepped and draped in the usual sterile fashion. Ultrasound was used to study the right common femoral artery which was patent. Using real-time ultrasound guidance, a 19 gauge introducer needle was used to access the right common femoral artery. Access was performed at 1054 a.m. A hard copy image ultrasound the saved and stored in PACS. Using this access, a 6 French sheath was placed in the descending thoracic aorta. Approximately 5000 units of heparin  was administered intravenously. Next, selective catheterization the left internal carotid artery was performed. A selective arteriogram was performed which demonstrated a carotid terminus occlusion with partial perfusion of the ACA and complete malperfusion of the MCA territory. Pretreatment TICI score 1. Next, a penumbra 43 and red 68 catheter was used to selectively catheterize the left M1 segment. The first pass was performed at 1107. This did not achieve recanalization. A second pass was performed with a EMBO trap device position in the left M1 segment at 11:13. This did not result in reperfusion. A third pass was performed with the EMBO trap position  in the left M1 segment at 11:20. This did not result in reperfusion. At this point, I transitioned to a zoom 71 catheter and solitaire device. The solitaire device was deployed in the left M1 segment at 11:34. This pass resulted in recanalization of the left MCA territory. No residual large vessel occlusion was observed. Slow flow was observed in a few small posterior parietal branches. Post treatment TICI score 2 C. After reviewing the imaging, I elected to terminate the procedure at  this point. Evaluation of the right femoral access site demonstrated that the site was suitable for a closure device. A 6 French Angio-Seal device was deployed without complication. Cone beam CT was then performed to evaluate for intracranial hemorrhage and treatment planning. This demonstrated no evidence of significant acute intracranial hemorrhage. The patient was removed from anesthesia and transferred to recovery in stable condition. IMPRESSION: 1. Suction mechanical thrombectomy of left carotid terminus and left M1 occlusion. PLAN: 1. To ICU for routine postoperative supportive care. Electronically Signed   By: Maude Naegeli M.D.   On: 11/20/2023 12:37   IR CT Head Ltd Result Date: 11/20/2023 PROCEDURE PERFORMED: 1. Cerebral angiography with stroke thrombectomy 2. Ultrasound guided vascular access 3. Cone beam CT for treatment planning COMPARISON:  CT angiogram of the head neck performed November 20, 2023 CLINICAL DATA:  73 year old male with acute ischemic stroke with symptoms of aphasia and right-sided weakness. NIH stroke scale measures 24. A multi disciplinary review was performed and the patient was offered stroke thrombectomy. INDICATION: Acute ischemic stroke. ANESTHESIA/SEDATION: General anesthesia was utilized for the procedure. CONTRAST:  Approximately 50 cc Ominipque 300 MEDICATIONS: Heparin  5000 units intravenously FLUOROSCOPY TIME:  Fluoroscopy Time: 12 minutes, (964 mGy). COMPLICATIONS: None immediate. BODY OF REPORT: Following a full explanation of the procedure along with the potential associated complications, an informed witnessed consent was obtained. The patient was then placed under general anesthesia by the Department of Anesthesiology at [facility]. The right femoral access site was prepped and draped in the usual sterile fashion. Ultrasound was used to study the right common femoral artery which was patent. Using real-time ultrasound guidance, a 19 gauge introducer needle was  used to access the right common femoral artery. Access was performed at 1054 a.m. A hard copy image ultrasound the saved and stored in PACS. Using this access, a 6 French sheath was placed in the descending thoracic aorta. Approximately 5000 units of heparin  was administered intravenously. Next, selective catheterization the left internal carotid artery was performed. A selective arteriogram was performed which demonstrated a carotid terminus occlusion with partial perfusion of the ACA and complete malperfusion of the MCA territory. Pretreatment TICI score 1. Next, a penumbra 43 and red 68 catheter was used to selectively catheterize the left M1 segment. The first pass was performed at 1107. This did not achieve recanalization. A second pass was performed with a EMBO trap device position in the left M1 segment at 11:13. This did not result in reperfusion. A third pass was performed with the EMBO trap position in the left M1 segment at 11:20. This did not result in reperfusion. At this point, I transitioned to a zoom 71 catheter and solitaire device. The solitaire device was deployed in the left M1 segment at 11:34. This pass resulted in recanalization of the left MCA territory. No residual large vessel occlusion was observed. Slow flow was observed in a few small posterior parietal branches. Post treatment TICI score 2 C. After reviewing the imaging, I elected to terminate  the procedure at this point. Evaluation of the right femoral access site demonstrated that the site was suitable for a closure device. A 6 French Angio-Seal device was deployed without complication. Cone beam CT was then performed to evaluate for intracranial hemorrhage and treatment planning. This demonstrated no evidence of significant acute intracranial hemorrhage. The patient was removed from anesthesia and transferred to recovery in stable condition. IMPRESSION: 1. Suction mechanical thrombectomy of left carotid terminus and left M1  occlusion. PLAN: 1. To ICU for routine postoperative supportive care. Electronically Signed   By: Maude Naegeli M.D.   On: 11/20/2023 12:37   CT ANGIO HEAD NECK W WO CM (CODE STROKE) Result Date: 11/20/2023 CLINICAL DATA:  73 year old male code stroke presentation. EXAM: CT ANGIOGRAPHY HEAD AND NECK TECHNIQUE: Multidetector CT imaging of the head and neck was performed using the standard protocol during bolus administration of intravenous contrast. Multiplanar CT image reconstructions and MIPs were obtained to evaluate the vascular anatomy. Carotid stenosis measurements (when applicable) are obtained utilizing NASCET criteria, using the distal internal carotid diameter as the denominator. RADIATION DOSE REDUCTION: This exam was performed according to the departmental dose-optimization program which includes automated exposure control, adjustment of the mA and/or kV according to patient size and/or use of iterative reconstruction technique. CONTRAST:  75mL OMNIPAQUE  IOHEXOL  350 MG/ML SOLN COMPARISON:  Plain head CT 1005 hours today reported separately. FINDINGS: CTA NECK Skeleton: Advanced cervical spine degeneration. Degenerative right C3-C4 facet ankylosis. No acute osseous abnormality identified. Upper chest: Mild atelectasis. Right chest Port-A-Cath partially visible. Other neck: Nonvascular neck soft tissue spaces are within normal limits. Aortic arch: Partially visible 3 vessel arch. Right carotid system: Mild tortuosity. Minimal soft and calcified plaque at the right carotid bifurcation. No stenosis. Left carotid system: Mild tortuosity. Mild soft and calcified plaque at the lateral left ICA origin without stenosis. Attenuated enhancement of the left ICA as it approaches the skull base. See intracranial findings below. Vertebral arteries: Non dominant right vertebral artery is patent to the skull base and negative. Visible proximal left subclavian artery, left vertebral artery origin, and dominant left  vertebral arteries are patent and within normal limits. CTA HEAD Posterior circulation: Distal vertebral arteries and vertebrobasilar junction are patent, dominant left V4 segment. Diminutive right V4 distal to the patent right PICA origin. Diminutive left PICA. Left AICA also patent. Patent basilar artery without stenosis. Patent basilar tip, SCA and PCA origins. Posterior communicating arteries are diminutive or absent. Bilateral PCA branches are within normal limits. Anterior circulation: Right ICA siphon is patent. Moderate right siphon calcified plaque, but no significant right siphon stenosis. Patent right ICA terminus. Left ICA T occlusion at the terminus, well demonstrated on series 14, image 39. Upstream attenuated enhancement of the left ICA. Superimposed up to moderate left siphon calcified plaque. Reconstitution of the right ACA A1. Normal anterior communicating artery. However, the right ACA A1 is non dominant (same image). Bilateral ACA branch enhancement within normal limits. Right MCA M1 segment, bifurcation, right MCA branches appear patent with mild branch irregularity. Faintly enhancing left MCA M1 and bifurcation, with poor early left MCA collateralization (series 15, image 32). Venous sinuses: Grossly patent. Anatomic variants: Dominant left vertebral artery. Dominant Left ACA A1. Review of the MIP images confirms the above findings Preliminary results of Left ICA T occlusion were communicated to Dr. Vanessa at 10:17 am on 11/20/2023 by text page via the [facility] messaging system. IMPRESSION: 1. Positive for Left ICA terminus Occlusion - ELVO. The left ACA A1 is  dominant, but the ACA branches are reconstituted from the right side. Poor early reconstitution of the left MCA. 2. Other generally mild for age atherosclerosis in the head and neck, no other hemodynamically significant arterial stenosis. Preliminary results were communicated at 1017 hours, and final CTA report communicated to Dr.  Vanessa on 11/20/2023 at 1024 hours. Electronically Signed   By: VEAR Hurst M.D.   On: 11/20/2023 10:41    Labs:  Basic Metabolic Panel: Recent Labs  Lab 12/15/23 0330 12/19/23 0358  NA 139 138  K 3.6 3.2*  CL 108 107  CO2 22 24  GLUCOSE 104* 101*  BUN 19 13  CREATININE 1.19 1.29*  CALCIUM  8.1* 7.9*    CBC: Recent Labs  Lab 12/15/23 0330 12/16/23 0400 12/17/23 0357 12/18/23 0345 12/19/23 0358  WBC 5.7 5.8  --   --   --   HGB 7.1* 8.1* 8.3* 8.4* 8.2*  HCT 22.2* 24.9* 25.6* 25.8* 25.5*  MCV 94.5 92.9  --   --   --   PLT 72* 74*  --   --   --     CBG: No results for input(s): GLUCAP in the last 168 hours.  Brief HPI:   Mcguire Muzquiz is a 73 y.o. male  with a PMH of HTN, presenting with acute punctate infarcts in the right occipital and bilateral cerebellar hemispheres s/p mechanical thrombectomy of the left carotid T occlusion and basilar tip occlusion, with TICI 2 revascularization of both occlusions with mechanical thrombectomy. Cause of stroke is thought to be hypercoagulable state in the setting of advanced cancer with Eliquis  failure. He was recently in CIR and transferred to acute due to sudden onset of right facial droop. Increased right sided weakness and expressive and receptive aphasia. Currently with impaired cognition and right sided weakness.  Therapy evaluations completed due to patient decreased functional mobility  and was admitted for a comprehensive rehab program.  Hospital Course: Binh Brickhouse was admitted to rehab 12/03/2023 for inpatient therapies to consist of PT, ST and OT at least three hours five days a week. Past admission physiatrist, therapy team and rehab RN have worked together to provide customized collaborative inpatient rehab. The patient was admitted for functional deficits secondary to left middle cerebral artery (MCA) and scattered infarcts, as well as left internal carotid artery (ICA) issues, following a mechanical thrombectomy performed  three times. These conditions were believed to be due to a hypercoagulable state in the context of cancer. The patient has a history of multiple bilateral deep vein thromboses (DVTs) and a pulmonary embolism (PE), with an inferior vena cava (IVC) filter placed in 2014. An ultrasound of the left lower extremity confirmed DVT presence. The patient transitioned from Eliquis  5mg  to a Heparin  drip and then to Coumadin , with therapeutic INR levels that will require outpatient monitoring with a primary care physician.   He denied pain but had access to Tylenol  and used melatonin as needed for sleep. A swallow study allowed continuation on a regular diet with an Ensure supplement and multivitamins, with encouragement for adequate oral intake. Potassium supplements administered daily addressed hypokalemia. The patient has a history of bladder cancer with bilateral nephrostomy tubes for stage IV metastatic cancer to the bone, confirmed by MRI of the spine in July 2025. He also has a history of prostate cancer and continues on Lupron  every six months, with follow-up by hematology-oncology. A UTI was treated with ceftriaxone , and the course was completed. Labs showed creatinine levels returned to baseline, and  hemoglobin was stable after transfusion of PRBCs, though it downtrended during readmission.   A fecal occult blood test was initially negative, but subsequent stool samples tested positive for occult blood. GI was consulted, and an EGD revealed a two-centimeter hiatal hernia and a six-millimeter sessile polyp in the cardia, with Coumadin  continuation advised. A colonoscopy was preformed without definite source. No further immediate workup required. The patient was advised to continue a regular diet and multivitamin B complex, with ferrous sulfate  added. Hemoglobin improved to 8.1 after PRBC transfusion, and Aranesp  was administered due to blood loss, with follow-up with oncology needed. For hypertension, medications  were held due to hypotension and orthostasis, with blood pressures stable without medication. Atorvastatin  80mg  was continued for hyperlipidemia. The patient received education on diet and weight loss for obesity management.   A history of laryngeal cancer with post-radiation hoarseness was monitored, and liver function tests, initially elevated, improved with monitoring. Thrombocytopenia and pancytopenia were evaluated by oncology, with platelets eventually improving. Lower extremity edema, more pronounced on the left, was attributed to albumin  and nutritional status, improved with Lasix  20mg . Edema decreased with patient education on leg elevation, compression socks, and increased protein and fluid intake.    Rehab course: During patient's stay in rehab weekly team conferences were held to monitor patient's progress, set goals and discuss barriers to discharge. At admission, patient required  min to mod assist with mobility and basic ADLs.  He has had improvement in activity tolerance, balance, postural control as well as ability to compensate for deficits.  Patient has met all long term goal with PT and will benefit from ongoing PT services in home health setting to improve functional mobility, and to address ongoing impairments in weakness, balance, deconditioning to minimize fall risk.  He also met all goals with SLP discharging at mod I for simple cognitive tasks and requires supervision cues for expressive and receptive communication.  He has had improvement in functional use RUE and RLE as well as improvement in awareness discharging at overall supervision level. Family teaching completed upon discharge to home with patients wife at bedside.     Disposition:  Discharge disposition: 06-Home-Health Care Svc        Diet: Regular diet   Special Instructions:  -Order has been placed for Home Health to collect Labs: BMET/CBC/INR on 12/22/2023. Labs will be reported to PCP and PM&R Dr.    GLENWOODNo smoking or alcohol or illicit drug use     Allergies as of 12/19/2023       Reactions   Bee Venom Anaphylaxis        Medication List     STOP taking these medications    amLODipine  5 MG tablet Commonly known as: NORVASC    apixaban  5 MG Tabs tablet Commonly known as: ELIQUIS        TAKE these medications    acetaminophen  325 MG tablet Commonly known as: TYLENOL  Take 2 tablets (650 mg total) by mouth every 6 (six) hours as needed for moderate pain (pain score 4-6).   alum & mag hydroxide-simeth 200-200-20 MG/5ML suspension Commonly known as: MAALOX/MYLANTA Take 30 mLs by mouth every 4 (four) hours as needed for indigestion, heartburn or flatulence (if pepcid  ineffective).   atorvastatin  80 MG tablet Commonly known as: LIPITOR  Take 1 tablet (80 mg total) by mouth daily. Start taking on: December 20, 2023   B-complex with vitamin C tablet Take 1 tablet by mouth daily. Start taking on: December 20, 2023   feeding supplement Liqd  Take 237 mLs by mouth 2 (two) times daily between meals.   ferrous sulfate  325 (65 FE) MG tablet Take 1 tablet (325 mg total) by mouth daily with breakfast. Start taking on: December 20, 2023   melatonin 5 MG Tabs Take 1 tablet (5 mg total) by mouth at bedtime as needed.   multivitamin with minerals Tabs tablet Take 1 tablet by mouth daily. Start taking on: December 20, 2023   mupirocin  ointment 2 % Commonly known as: BACTROBAN  Apply around nephrostomy tube   pantoprazole  40 MG tablet Commonly known as: PROTONIX  Take 1 tablet (40 mg total) by mouth 2 (two) times daily.   polyethylene glycol 17 g packet Commonly known as: MIRALAX  / GLYCOLAX  Take 17 g by mouth daily as needed for mild constipation.   potassium chloride  SA 20 MEQ tablet Commonly known as: KLOR-CON  M Take 1 tablet (20 mEq total) by mouth daily. Start taking on: December 20, 2023   warfarin 2 MG tablet Commonly known as: COUMADIN  Take 1 tablet (2 mg  total) by mouth daily. What changed:  medication strength how much to take when to take this        Follow-up Information     Ina Marcellus RAMAN, MD Follow up.   Specialty: Family Medicine Why: Call for an appointment. Hospital f/u with labs. Contact information: [address] [place] [address] [phone]         [facility] Follow up.   Specialty: Neurology Why: Office will call for appointment Contact information: [address] [place]  [address] [phone]        Volanda Lavada PARAS, MD Follow up.   Specialty: Hematology and Oncology Why: Appointment has been made for 12/28/2023. Contact information: [address] [place] [address] [phone]                 Signed: Daphne LITTIE Finders 12/19/2023, 10:31 AM

## 2023-12-19 NOTE — Progress Notes (Signed)
 Physical Therapy Session Note  Patient Details  Name: Allen Macdonald MRN: [id number] Date of Birth: 1950/05/25  Today's Date: 12/19/2023 PT Individual Time: 0902-0925 PT Individual Time Calculation (min): 23 min   Short Term Goals: Week 2:  PT Short Term Goal 1 (Week 2): = LTGs  Skilled Therapeutic Interventions/Progress Updates:        Pt presents seated in recliner - awake and agreeable to therapy treatment. Pt is aware of upcoming DC plan. Briefly reviewed follow up recommendations and DME needs.   Pt completed sit<>stand to RW mod I. He ambulates ~125ft to ortho gym at supervision level while using the RW. Decreased gait speed but no LOB or buckling observed.   Pt assisted onto the Nustep, needing setupA for BLE due to weakness and difficulty placing into foot saddles. Pt completes 11 minutes at L3 resistance with BUE/BLE to target whole body strengthening and endurance training. Achieved 620 steps in total.  Pt returned to his room, left sitting up in recliner with BLE elevated. Nurse at bedside for med pass. All needs met.   Therapy Documentation Precautions:  Precautions Precautions: Fall Recall of Precautions/Restrictions: Intact Precaution/Restrictions Comments: bilat nephrostomy bags, fall, aphasia, mild R hemi (has improved since evaluation) Restrictions Weight Bearing Restrictions Per Provider Order: No General:     Therapy/Group: Individual Therapy  [name] 12/19/2023, 7:37 AM

## 2023-12-19 NOTE — Progress Notes (Signed)
 PHARMACY - ANTICOAGULATION CONSULT NOTE  Pharmacy Consult: Warfarin Indication: History of VTE with recurrent CVAs  Allergies  Allergen Reactions   Bee Venom Anaphylaxis    Patient Measurements: Height: 5' 10 (177.8 cm) Weight: 104.9 kg (231 lb 4.2 oz) IBW/kg (Calculated) : 73  Vital Signs: Temp: 98.1 F (36.7 C) (09/01 0418) Temp Source: Oral (09/01 0418) BP: 120/67 (09/01 0418) Pulse Rate: 87 (09/01 0418)  Labs: Recent Labs    12/17/23 0357 12/18/23 0345 12/19/23 0358  HGB 8.3* 8.4* 8.2*  HCT 25.6* 25.8* 25.5*  LABPROT 25.4* 28.3* 29.1*  INR 2.2* 2.5* 2.6*  CREATININE  --   --  1.29*    Estimated Creatinine Clearance: 60 mL/min (A) (by C-G formula based on SCr of 1.29 mg/dL (H)).  Assessment: 63 YOM with 2 recent L MCA strokes s/p MT each time.  Patient went to Rehab and was receiving Eliquis , last dose on 8/12 at 0847.  Patient returned to the inpatient side with recurrent CVA on 11/29/23.  MRI shows petechial hemorrhage with new/increased scattered punctate infarcts and new microhemorrhages.   Pharmacy consulted to dose warfarin. Stopped Eliquis .  PRBC x 1 on 8/21 & 8/28, Aranesp  x1 on 8/28  INR is therapeutic at 2.6 this morning. Pt consistently eating ~75-100% of meals. Patient had EGD on 8/29 and colonoscopy on 8/30. No overt bleeding noted, Hgb stable 8s post transfusion.   Goal of Therapy:  INR 2 to 3 Monitor platelets by anticoagulation protocol: Yes  Plan:  Warfarin 2 mg PO daily Check INR daily while on warfarin CBC Mon/Thurs  Monitor clinical course, s/sx of bleed, PO intake/diet, Drug-Drug Interactions  Thank you for involving pharmacy in this patient's care.  Delon Sax, PharmD, BCPS Clinical Pharmacist Clinical phone for 12/19/2023 is [phone] 12/19/2023 7:29 AM

## 2023-12-19 NOTE — Plan of Care (Signed)
 Pt met all supervision goals.    Problem: RH Balance Goal: LTG Patient will maintain dynamic standing with ADLs (OT) Description: LTG:  Patient will maintain dynamic standing balance with assist during activities of daily living (OT)  Outcome: Completed/Met   Problem: RH Grooming Goal: LTG Patient will perform grooming w/assist,cues/equip (OT) Description: LTG: Patient will perform grooming with assist, with/without cues using equipment (OT) Outcome: Completed/Met   Problem: RH Bathing Goal: LTG Patient will bathe all body parts with assist levels (OT) Description: LTG: Patient will bathe all body parts with assist levels (OT) Outcome: Completed/Met   Problem: RH Dressing Goal: LTG Patient will perform upper body dressing (OT) Description: LTG Patient will perform upper body dressing with assist, with/without cues (OT). Outcome: Completed/Met Goal: LTG Patient will perform lower body dressing w/assist (OT) Description: LTG: Patient will perform lower body dressing with assist, with/without cues in positioning using equipment (OT) Outcome: Completed/Met   Problem: RH Toileting Goal: LTG Patient will perform toileting task (3/3 steps) with assistance level (OT) Description: LTG: Patient will perform toileting task (3/3 steps) with assistance level (OT)  Outcome: Completed/Met   Problem: RH Toilet Transfers Goal: LTG Patient will perform toilet transfers w/assist (OT) Description: LTG: Patient will perform toilet transfers with assist, with/without cues using equipment (OT) Outcome: Completed/Met

## 2023-12-19 NOTE — Plan of Care (Signed)
  Problem: RH Comprehension Communication Goal: LTG Patient will comprehend basic/complex auditory (SLP) Description: LTG: Patient will comprehend basic/complex auditory information with cues (SLP). Outcome: Completed/Met   Problem: RH Expression Communication Goal: LTG Patient will express needs/wants via multi-modal(SLP) Description: LTG:  Patient will express needs/wants via multi-modal communication (gestures/written, etc) with cues (SLP) Outcome: Completed/Met

## 2023-12-20 ENCOUNTER — Encounter (HOSPITAL_COMMUNITY): Payer: Self-pay | Admitting: Gastroenterology

## 2023-12-20 ENCOUNTER — Telehealth: Payer: Self-pay | Admitting: Pharmacist

## 2023-12-20 DIAGNOSIS — N179 Acute kidney failure, unspecified: Secondary | ICD-10-CM | POA: Diagnosis not present

## 2023-12-20 DIAGNOSIS — I420 Dilated cardiomyopathy: Secondary | ICD-10-CM | POA: Diagnosis not present

## 2023-12-20 DIAGNOSIS — I69392 Facial weakness following cerebral infarction: Secondary | ICD-10-CM | POA: Diagnosis not present

## 2023-12-20 DIAGNOSIS — Z86718 Personal history of other venous thrombosis and embolism: Secondary | ICD-10-CM | POA: Diagnosis not present

## 2023-12-20 DIAGNOSIS — D61818 Other pancytopenia: Secondary | ICD-10-CM | POA: Diagnosis not present

## 2023-12-20 DIAGNOSIS — D630 Anemia in neoplastic disease: Secondary | ICD-10-CM | POA: Diagnosis not present

## 2023-12-20 DIAGNOSIS — I69320 Aphasia following cerebral infarction: Secondary | ICD-10-CM | POA: Diagnosis not present

## 2023-12-20 DIAGNOSIS — C7951 Secondary malignant neoplasm of bone: Secondary | ICD-10-CM | POA: Diagnosis not present

## 2023-12-20 DIAGNOSIS — Z8546 Personal history of malignant neoplasm of prostate: Secondary | ICD-10-CM | POA: Diagnosis not present

## 2023-12-20 DIAGNOSIS — Z936 Other artificial openings of urinary tract status: Secondary | ICD-10-CM | POA: Diagnosis not present

## 2023-12-20 DIAGNOSIS — R21 Rash and other nonspecific skin eruption: Secondary | ICD-10-CM | POA: Diagnosis not present

## 2023-12-20 DIAGNOSIS — I69318 Other symptoms and signs involving cognitive functions following cerebral infarction: Secondary | ICD-10-CM | POA: Diagnosis not present

## 2023-12-20 DIAGNOSIS — I69391 Dysphagia following cerebral infarction: Secondary | ICD-10-CM | POA: Diagnosis not present

## 2023-12-20 DIAGNOSIS — Z9841 Cataract extraction status, right eye: Secondary | ICD-10-CM | POA: Diagnosis not present

## 2023-12-20 DIAGNOSIS — I10 Essential (primary) hypertension: Secondary | ICD-10-CM | POA: Diagnosis not present

## 2023-12-20 DIAGNOSIS — C679 Malignant neoplasm of bladder, unspecified: Secondary | ICD-10-CM | POA: Diagnosis not present

## 2023-12-20 DIAGNOSIS — E669 Obesity, unspecified: Secondary | ICD-10-CM | POA: Diagnosis not present

## 2023-12-20 DIAGNOSIS — I959 Hypotension, unspecified: Secondary | ICD-10-CM | POA: Diagnosis not present

## 2023-12-20 DIAGNOSIS — Z9842 Cataract extraction status, left eye: Secondary | ICD-10-CM | POA: Diagnosis not present

## 2023-12-20 DIAGNOSIS — D696 Thrombocytopenia, unspecified: Secondary | ICD-10-CM | POA: Diagnosis not present

## 2023-12-20 DIAGNOSIS — I69351 Hemiplegia and hemiparesis following cerebral infarction affecting right dominant side: Secondary | ICD-10-CM | POA: Diagnosis not present

## 2023-12-20 DIAGNOSIS — Z8521 Personal history of malignant neoplasm of larynx: Secondary | ICD-10-CM | POA: Diagnosis not present

## 2023-12-20 DIAGNOSIS — Z6832 Body mass index (BMI) 32.0-32.9, adult: Secondary | ICD-10-CM | POA: Diagnosis not present

## 2023-12-20 DIAGNOSIS — Z87440 Personal history of urinary (tract) infections: Secondary | ICD-10-CM | POA: Diagnosis not present

## 2023-12-20 NOTE — Telephone Encounter (Signed)
 Nurse called regarding patient's warfarin. Discharged on 9/1 for CVA. History of PE and DVT with stage IV bladder cancer. Advised would need to follow up with PCP for Coumadin  monitoring since he is not currently on warfarin for a cardiac indication.

## 2023-12-21 DIAGNOSIS — C61 Malignant neoplasm of prostate: Secondary | ICD-10-CM | POA: Diagnosis not present

## 2023-12-21 DIAGNOSIS — N1339 Other hydronephrosis: Secondary | ICD-10-CM | POA: Diagnosis not present

## 2023-12-22 DIAGNOSIS — I420 Dilated cardiomyopathy: Secondary | ICD-10-CM | POA: Diagnosis not present

## 2023-12-22 DIAGNOSIS — Z6832 Body mass index (BMI) 32.0-32.9, adult: Secondary | ICD-10-CM | POA: Diagnosis not present

## 2023-12-22 DIAGNOSIS — I69351 Hemiplegia and hemiparesis following cerebral infarction affecting right dominant side: Secondary | ICD-10-CM | POA: Diagnosis not present

## 2023-12-22 DIAGNOSIS — I959 Hypotension, unspecified: Secondary | ICD-10-CM | POA: Diagnosis not present

## 2023-12-22 DIAGNOSIS — Z8521 Personal history of malignant neoplasm of larynx: Secondary | ICD-10-CM | POA: Diagnosis not present

## 2023-12-22 DIAGNOSIS — D696 Thrombocytopenia, unspecified: Secondary | ICD-10-CM | POA: Diagnosis not present

## 2023-12-22 DIAGNOSIS — E669 Obesity, unspecified: Secondary | ICD-10-CM | POA: Diagnosis not present

## 2023-12-22 DIAGNOSIS — Z86718 Personal history of other venous thrombosis and embolism: Secondary | ICD-10-CM | POA: Diagnosis not present

## 2023-12-22 DIAGNOSIS — Z8546 Personal history of malignant neoplasm of prostate: Secondary | ICD-10-CM | POA: Diagnosis not present

## 2023-12-22 DIAGNOSIS — I69392 Facial weakness following cerebral infarction: Secondary | ICD-10-CM | POA: Diagnosis not present

## 2023-12-22 DIAGNOSIS — Z936 Other artificial openings of urinary tract status: Secondary | ICD-10-CM | POA: Diagnosis not present

## 2023-12-22 DIAGNOSIS — Z9842 Cataract extraction status, left eye: Secondary | ICD-10-CM | POA: Diagnosis not present

## 2023-12-22 DIAGNOSIS — I69391 Dysphagia following cerebral infarction: Secondary | ICD-10-CM | POA: Diagnosis not present

## 2023-12-22 DIAGNOSIS — Z9841 Cataract extraction status, right eye: Secondary | ICD-10-CM | POA: Diagnosis not present

## 2023-12-22 DIAGNOSIS — I10 Essential (primary) hypertension: Secondary | ICD-10-CM | POA: Diagnosis not present

## 2023-12-22 DIAGNOSIS — D630 Anemia in neoplastic disease: Secondary | ICD-10-CM | POA: Diagnosis not present

## 2023-12-22 DIAGNOSIS — D61818 Other pancytopenia: Secondary | ICD-10-CM | POA: Diagnosis not present

## 2023-12-22 DIAGNOSIS — C679 Malignant neoplasm of bladder, unspecified: Secondary | ICD-10-CM | POA: Diagnosis not present

## 2023-12-22 DIAGNOSIS — I69320 Aphasia following cerebral infarction: Secondary | ICD-10-CM | POA: Diagnosis not present

## 2023-12-22 DIAGNOSIS — I69318 Other symptoms and signs involving cognitive functions following cerebral infarction: Secondary | ICD-10-CM | POA: Diagnosis not present

## 2023-12-22 DIAGNOSIS — R21 Rash and other nonspecific skin eruption: Secondary | ICD-10-CM | POA: Diagnosis not present

## 2023-12-22 DIAGNOSIS — C7951 Secondary malignant neoplasm of bone: Secondary | ICD-10-CM | POA: Diagnosis not present

## 2023-12-22 DIAGNOSIS — Z87440 Personal history of urinary (tract) infections: Secondary | ICD-10-CM | POA: Diagnosis not present

## 2023-12-22 DIAGNOSIS — N179 Acute kidney failure, unspecified: Secondary | ICD-10-CM | POA: Diagnosis not present

## 2023-12-26 DIAGNOSIS — Z8546 Personal history of malignant neoplasm of prostate: Secondary | ICD-10-CM | POA: Diagnosis not present

## 2023-12-26 DIAGNOSIS — N179 Acute kidney failure, unspecified: Secondary | ICD-10-CM | POA: Diagnosis not present

## 2023-12-26 DIAGNOSIS — I420 Dilated cardiomyopathy: Secondary | ICD-10-CM | POA: Diagnosis not present

## 2023-12-26 DIAGNOSIS — R21 Rash and other nonspecific skin eruption: Secondary | ICD-10-CM | POA: Diagnosis not present

## 2023-12-26 DIAGNOSIS — D696 Thrombocytopenia, unspecified: Secondary | ICD-10-CM | POA: Diagnosis not present

## 2023-12-26 DIAGNOSIS — I10 Essential (primary) hypertension: Secondary | ICD-10-CM | POA: Diagnosis not present

## 2023-12-26 DIAGNOSIS — E669 Obesity, unspecified: Secondary | ICD-10-CM | POA: Diagnosis not present

## 2023-12-26 DIAGNOSIS — Z6832 Body mass index (BMI) 32.0-32.9, adult: Secondary | ICD-10-CM | POA: Diagnosis not present

## 2023-12-26 DIAGNOSIS — Z8521 Personal history of malignant neoplasm of larynx: Secondary | ICD-10-CM | POA: Diagnosis not present

## 2023-12-27 DIAGNOSIS — I82409 Acute embolism and thrombosis of unspecified deep veins of unspecified lower extremity: Secondary | ICD-10-CM | POA: Diagnosis not present

## 2023-12-27 DIAGNOSIS — Z7901 Long term (current) use of anticoagulants: Secondary | ICD-10-CM | POA: Diagnosis not present

## 2023-12-28 DIAGNOSIS — Z8546 Personal history of malignant neoplasm of prostate: Secondary | ICD-10-CM | POA: Diagnosis not present

## 2023-12-28 DIAGNOSIS — I69391 Dysphagia following cerebral infarction: Secondary | ICD-10-CM | POA: Diagnosis not present

## 2023-12-28 DIAGNOSIS — Z9841 Cataract extraction status, right eye: Secondary | ICD-10-CM | POA: Diagnosis not present

## 2023-12-28 DIAGNOSIS — C679 Malignant neoplasm of bladder, unspecified: Secondary | ICD-10-CM | POA: Diagnosis not present

## 2023-12-28 DIAGNOSIS — N179 Acute kidney failure, unspecified: Secondary | ICD-10-CM | POA: Diagnosis not present

## 2023-12-28 DIAGNOSIS — Z9842 Cataract extraction status, left eye: Secondary | ICD-10-CM | POA: Diagnosis not present

## 2023-12-28 DIAGNOSIS — I69318 Other symptoms and signs involving cognitive functions following cerebral infarction: Secondary | ICD-10-CM | POA: Diagnosis not present

## 2023-12-28 DIAGNOSIS — I69351 Hemiplegia and hemiparesis following cerebral infarction affecting right dominant side: Secondary | ICD-10-CM | POA: Diagnosis not present

## 2023-12-28 DIAGNOSIS — I69392 Facial weakness following cerebral infarction: Secondary | ICD-10-CM | POA: Diagnosis not present

## 2023-12-28 DIAGNOSIS — D630 Anemia in neoplastic disease: Secondary | ICD-10-CM | POA: Diagnosis not present

## 2023-12-28 DIAGNOSIS — Z7901 Long term (current) use of anticoagulants: Secondary | ICD-10-CM | POA: Diagnosis not present

## 2023-12-28 DIAGNOSIS — Z86718 Personal history of other venous thrombosis and embolism: Secondary | ICD-10-CM | POA: Diagnosis not present

## 2023-12-28 DIAGNOSIS — Z8673 Personal history of transient ischemic attack (TIA), and cerebral infarction without residual deficits: Secondary | ICD-10-CM | POA: Diagnosis not present

## 2023-12-28 DIAGNOSIS — D696 Thrombocytopenia, unspecified: Secondary | ICD-10-CM | POA: Diagnosis not present

## 2023-12-28 DIAGNOSIS — R21 Rash and other nonspecific skin eruption: Secondary | ICD-10-CM | POA: Diagnosis not present

## 2023-12-28 DIAGNOSIS — D61818 Other pancytopenia: Secondary | ICD-10-CM | POA: Diagnosis not present

## 2023-12-28 DIAGNOSIS — I959 Hypotension, unspecified: Secondary | ICD-10-CM | POA: Diagnosis not present

## 2023-12-28 DIAGNOSIS — C7951 Secondary malignant neoplasm of bone: Secondary | ICD-10-CM | POA: Diagnosis not present

## 2023-12-28 DIAGNOSIS — Z936 Other artificial openings of urinary tract status: Secondary | ICD-10-CM | POA: Diagnosis not present

## 2023-12-28 DIAGNOSIS — I10 Essential (primary) hypertension: Secondary | ICD-10-CM | POA: Diagnosis not present

## 2023-12-28 DIAGNOSIS — E669 Obesity, unspecified: Secondary | ICD-10-CM | POA: Diagnosis not present

## 2023-12-28 DIAGNOSIS — I69320 Aphasia following cerebral infarction: Secondary | ICD-10-CM | POA: Diagnosis not present

## 2023-12-28 DIAGNOSIS — Z6832 Body mass index (BMI) 32.0-32.9, adult: Secondary | ICD-10-CM | POA: Diagnosis not present

## 2023-12-28 DIAGNOSIS — Z8521 Personal history of malignant neoplasm of larynx: Secondary | ICD-10-CM | POA: Diagnosis not present

## 2023-12-28 DIAGNOSIS — I420 Dilated cardiomyopathy: Secondary | ICD-10-CM | POA: Diagnosis not present

## 2023-12-28 DIAGNOSIS — C61 Malignant neoplasm of prostate: Secondary | ICD-10-CM | POA: Diagnosis not present

## 2023-12-28 DIAGNOSIS — Z87440 Personal history of urinary (tract) infections: Secondary | ICD-10-CM | POA: Diagnosis not present

## 2023-12-29 DIAGNOSIS — Z87440 Personal history of urinary (tract) infections: Secondary | ICD-10-CM | POA: Diagnosis not present

## 2023-12-29 DIAGNOSIS — N179 Acute kidney failure, unspecified: Secondary | ICD-10-CM | POA: Diagnosis not present

## 2023-12-29 DIAGNOSIS — Z9841 Cataract extraction status, right eye: Secondary | ICD-10-CM | POA: Diagnosis not present

## 2023-12-29 DIAGNOSIS — Z9842 Cataract extraction status, left eye: Secondary | ICD-10-CM | POA: Diagnosis not present

## 2023-12-29 DIAGNOSIS — I69351 Hemiplegia and hemiparesis following cerebral infarction affecting right dominant side: Secondary | ICD-10-CM | POA: Diagnosis not present

## 2023-12-29 DIAGNOSIS — I69320 Aphasia following cerebral infarction: Secondary | ICD-10-CM | POA: Diagnosis not present

## 2023-12-29 DIAGNOSIS — I959 Hypotension, unspecified: Secondary | ICD-10-CM | POA: Diagnosis not present

## 2023-12-29 DIAGNOSIS — I420 Dilated cardiomyopathy: Secondary | ICD-10-CM | POA: Diagnosis not present

## 2023-12-29 DIAGNOSIS — Z8546 Personal history of malignant neoplasm of prostate: Secondary | ICD-10-CM | POA: Diagnosis not present

## 2023-12-29 DIAGNOSIS — D696 Thrombocytopenia, unspecified: Secondary | ICD-10-CM | POA: Diagnosis not present

## 2023-12-29 DIAGNOSIS — Z8521 Personal history of malignant neoplasm of larynx: Secondary | ICD-10-CM | POA: Diagnosis not present

## 2023-12-29 DIAGNOSIS — C7951 Secondary malignant neoplasm of bone: Secondary | ICD-10-CM | POA: Diagnosis not present

## 2023-12-29 DIAGNOSIS — D61818 Other pancytopenia: Secondary | ICD-10-CM | POA: Diagnosis not present

## 2023-12-29 DIAGNOSIS — Z6832 Body mass index (BMI) 32.0-32.9, adult: Secondary | ICD-10-CM | POA: Diagnosis not present

## 2023-12-29 DIAGNOSIS — I69391 Dysphagia following cerebral infarction: Secondary | ICD-10-CM | POA: Diagnosis not present

## 2023-12-29 DIAGNOSIS — I69392 Facial weakness following cerebral infarction: Secondary | ICD-10-CM | POA: Diagnosis not present

## 2023-12-29 DIAGNOSIS — I69318 Other symptoms and signs involving cognitive functions following cerebral infarction: Secondary | ICD-10-CM | POA: Diagnosis not present

## 2023-12-29 DIAGNOSIS — I10 Essential (primary) hypertension: Secondary | ICD-10-CM | POA: Diagnosis not present

## 2023-12-29 DIAGNOSIS — Z86718 Personal history of other venous thrombosis and embolism: Secondary | ICD-10-CM | POA: Diagnosis not present

## 2023-12-29 DIAGNOSIS — R21 Rash and other nonspecific skin eruption: Secondary | ICD-10-CM | POA: Diagnosis not present

## 2023-12-29 DIAGNOSIS — D630 Anemia in neoplastic disease: Secondary | ICD-10-CM | POA: Diagnosis not present

## 2023-12-29 DIAGNOSIS — Z936 Other artificial openings of urinary tract status: Secondary | ICD-10-CM | POA: Diagnosis not present

## 2023-12-29 DIAGNOSIS — E669 Obesity, unspecified: Secondary | ICD-10-CM | POA: Diagnosis not present

## 2023-12-29 DIAGNOSIS — C679 Malignant neoplasm of bladder, unspecified: Secondary | ICD-10-CM | POA: Diagnosis not present

## 2024-01-02 DIAGNOSIS — R21 Rash and other nonspecific skin eruption: Secondary | ICD-10-CM | POA: Diagnosis not present

## 2024-01-02 DIAGNOSIS — C679 Malignant neoplasm of bladder, unspecified: Secondary | ICD-10-CM | POA: Diagnosis not present

## 2024-01-02 DIAGNOSIS — N179 Acute kidney failure, unspecified: Secondary | ICD-10-CM | POA: Diagnosis not present

## 2024-01-02 DIAGNOSIS — Z87440 Personal history of urinary (tract) infections: Secondary | ICD-10-CM | POA: Diagnosis not present

## 2024-01-02 DIAGNOSIS — Z8546 Personal history of malignant neoplasm of prostate: Secondary | ICD-10-CM | POA: Diagnosis not present

## 2024-01-02 DIAGNOSIS — I69320 Aphasia following cerebral infarction: Secondary | ICD-10-CM | POA: Diagnosis not present

## 2024-01-02 DIAGNOSIS — D630 Anemia in neoplastic disease: Secondary | ICD-10-CM | POA: Diagnosis not present

## 2024-01-02 DIAGNOSIS — I69318 Other symptoms and signs involving cognitive functions following cerebral infarction: Secondary | ICD-10-CM | POA: Diagnosis not present

## 2024-01-02 DIAGNOSIS — I420 Dilated cardiomyopathy: Secondary | ICD-10-CM | POA: Diagnosis not present

## 2024-01-02 DIAGNOSIS — C7951 Secondary malignant neoplasm of bone: Secondary | ICD-10-CM | POA: Diagnosis not present

## 2024-01-02 DIAGNOSIS — Z86718 Personal history of other venous thrombosis and embolism: Secondary | ICD-10-CM | POA: Diagnosis not present

## 2024-01-02 DIAGNOSIS — I69391 Dysphagia following cerebral infarction: Secondary | ICD-10-CM | POA: Diagnosis not present

## 2024-01-02 DIAGNOSIS — Z9841 Cataract extraction status, right eye: Secondary | ICD-10-CM | POA: Diagnosis not present

## 2024-01-02 DIAGNOSIS — E669 Obesity, unspecified: Secondary | ICD-10-CM | POA: Diagnosis not present

## 2024-01-02 DIAGNOSIS — Z6832 Body mass index (BMI) 32.0-32.9, adult: Secondary | ICD-10-CM | POA: Diagnosis not present

## 2024-01-02 DIAGNOSIS — D696 Thrombocytopenia, unspecified: Secondary | ICD-10-CM | POA: Diagnosis not present

## 2024-01-02 DIAGNOSIS — I69392 Facial weakness following cerebral infarction: Secondary | ICD-10-CM | POA: Diagnosis not present

## 2024-01-02 DIAGNOSIS — Z8521 Personal history of malignant neoplasm of larynx: Secondary | ICD-10-CM | POA: Diagnosis not present

## 2024-01-02 DIAGNOSIS — I10 Essential (primary) hypertension: Secondary | ICD-10-CM | POA: Diagnosis not present

## 2024-01-02 DIAGNOSIS — D61818 Other pancytopenia: Secondary | ICD-10-CM | POA: Diagnosis not present

## 2024-01-02 DIAGNOSIS — I959 Hypotension, unspecified: Secondary | ICD-10-CM | POA: Diagnosis not present

## 2024-01-02 DIAGNOSIS — Z936 Other artificial openings of urinary tract status: Secondary | ICD-10-CM | POA: Diagnosis not present

## 2024-01-02 DIAGNOSIS — Z9842 Cataract extraction status, left eye: Secondary | ICD-10-CM | POA: Diagnosis not present

## 2024-01-02 DIAGNOSIS — I69351 Hemiplegia and hemiparesis following cerebral infarction affecting right dominant side: Secondary | ICD-10-CM | POA: Diagnosis not present

## 2024-01-03 DIAGNOSIS — Z936 Other artificial openings of urinary tract status: Secondary | ICD-10-CM | POA: Diagnosis not present

## 2024-01-03 DIAGNOSIS — Z8546 Personal history of malignant neoplasm of prostate: Secondary | ICD-10-CM | POA: Diagnosis not present

## 2024-01-03 DIAGNOSIS — I10 Essential (primary) hypertension: Secondary | ICD-10-CM | POA: Diagnosis not present

## 2024-01-03 DIAGNOSIS — D630 Anemia in neoplastic disease: Secondary | ICD-10-CM | POA: Diagnosis not present

## 2024-01-03 DIAGNOSIS — I69318 Other symptoms and signs involving cognitive functions following cerebral infarction: Secondary | ICD-10-CM | POA: Diagnosis not present

## 2024-01-03 DIAGNOSIS — D696 Thrombocytopenia, unspecified: Secondary | ICD-10-CM | POA: Diagnosis not present

## 2024-01-03 DIAGNOSIS — I69392 Facial weakness following cerebral infarction: Secondary | ICD-10-CM | POA: Diagnosis not present

## 2024-01-03 DIAGNOSIS — C679 Malignant neoplasm of bladder, unspecified: Secondary | ICD-10-CM | POA: Diagnosis not present

## 2024-01-03 DIAGNOSIS — N179 Acute kidney failure, unspecified: Secondary | ICD-10-CM | POA: Diagnosis not present

## 2024-01-03 DIAGNOSIS — I959 Hypotension, unspecified: Secondary | ICD-10-CM | POA: Diagnosis not present

## 2024-01-03 DIAGNOSIS — Z9842 Cataract extraction status, left eye: Secondary | ICD-10-CM | POA: Diagnosis not present

## 2024-01-03 DIAGNOSIS — I69351 Hemiplegia and hemiparesis following cerebral infarction affecting right dominant side: Secondary | ICD-10-CM | POA: Diagnosis not present

## 2024-01-03 DIAGNOSIS — D61818 Other pancytopenia: Secondary | ICD-10-CM | POA: Diagnosis not present

## 2024-01-03 DIAGNOSIS — Z86718 Personal history of other venous thrombosis and embolism: Secondary | ICD-10-CM | POA: Diagnosis not present

## 2024-01-03 DIAGNOSIS — C7951 Secondary malignant neoplasm of bone: Secondary | ICD-10-CM | POA: Diagnosis not present

## 2024-01-03 DIAGNOSIS — Z6832 Body mass index (BMI) 32.0-32.9, adult: Secondary | ICD-10-CM | POA: Diagnosis not present

## 2024-01-03 DIAGNOSIS — Z87440 Personal history of urinary (tract) infections: Secondary | ICD-10-CM | POA: Diagnosis not present

## 2024-01-03 DIAGNOSIS — R21 Rash and other nonspecific skin eruption: Secondary | ICD-10-CM | POA: Diagnosis not present

## 2024-01-03 DIAGNOSIS — Z8521 Personal history of malignant neoplasm of larynx: Secondary | ICD-10-CM | POA: Diagnosis not present

## 2024-01-03 DIAGNOSIS — E669 Obesity, unspecified: Secondary | ICD-10-CM | POA: Diagnosis not present

## 2024-01-03 DIAGNOSIS — Z9841 Cataract extraction status, right eye: Secondary | ICD-10-CM | POA: Diagnosis not present

## 2024-01-03 DIAGNOSIS — I69391 Dysphagia following cerebral infarction: Secondary | ICD-10-CM | POA: Diagnosis not present

## 2024-01-03 DIAGNOSIS — I69320 Aphasia following cerebral infarction: Secondary | ICD-10-CM | POA: Diagnosis not present

## 2024-01-03 DIAGNOSIS — I420 Dilated cardiomyopathy: Secondary | ICD-10-CM | POA: Diagnosis not present

## 2024-01-04 ENCOUNTER — Encounter: Admitting: Neuroradiology

## 2024-01-05 DIAGNOSIS — Z9842 Cataract extraction status, left eye: Secondary | ICD-10-CM | POA: Diagnosis not present

## 2024-01-05 DIAGNOSIS — D696 Thrombocytopenia, unspecified: Secondary | ICD-10-CM | POA: Diagnosis not present

## 2024-01-05 DIAGNOSIS — Z8521 Personal history of malignant neoplasm of larynx: Secondary | ICD-10-CM | POA: Diagnosis not present

## 2024-01-05 DIAGNOSIS — N179 Acute kidney failure, unspecified: Secondary | ICD-10-CM | POA: Diagnosis not present

## 2024-01-05 DIAGNOSIS — I69351 Hemiplegia and hemiparesis following cerebral infarction affecting right dominant side: Secondary | ICD-10-CM | POA: Diagnosis not present

## 2024-01-05 DIAGNOSIS — Z9841 Cataract extraction status, right eye: Secondary | ICD-10-CM | POA: Diagnosis not present

## 2024-01-05 DIAGNOSIS — C679 Malignant neoplasm of bladder, unspecified: Secondary | ICD-10-CM | POA: Diagnosis not present

## 2024-01-05 DIAGNOSIS — I959 Hypotension, unspecified: Secondary | ICD-10-CM | POA: Diagnosis not present

## 2024-01-05 DIAGNOSIS — I69391 Dysphagia following cerebral infarction: Secondary | ICD-10-CM | POA: Diagnosis not present

## 2024-01-05 DIAGNOSIS — Z936 Other artificial openings of urinary tract status: Secondary | ICD-10-CM | POA: Diagnosis not present

## 2024-01-05 DIAGNOSIS — R21 Rash and other nonspecific skin eruption: Secondary | ICD-10-CM | POA: Diagnosis not present

## 2024-01-05 DIAGNOSIS — D61818 Other pancytopenia: Secondary | ICD-10-CM | POA: Diagnosis not present

## 2024-01-05 DIAGNOSIS — D630 Anemia in neoplastic disease: Secondary | ICD-10-CM | POA: Diagnosis not present

## 2024-01-05 DIAGNOSIS — Z6832 Body mass index (BMI) 32.0-32.9, adult: Secondary | ICD-10-CM | POA: Diagnosis not present

## 2024-01-05 DIAGNOSIS — I420 Dilated cardiomyopathy: Secondary | ICD-10-CM | POA: Diagnosis not present

## 2024-01-05 DIAGNOSIS — Z86718 Personal history of other venous thrombosis and embolism: Secondary | ICD-10-CM | POA: Diagnosis not present

## 2024-01-05 DIAGNOSIS — E669 Obesity, unspecified: Secondary | ICD-10-CM | POA: Diagnosis not present

## 2024-01-05 DIAGNOSIS — I10 Essential (primary) hypertension: Secondary | ICD-10-CM | POA: Diagnosis not present

## 2024-01-05 DIAGNOSIS — I69320 Aphasia following cerebral infarction: Secondary | ICD-10-CM | POA: Diagnosis not present

## 2024-01-05 DIAGNOSIS — I69318 Other symptoms and signs involving cognitive functions following cerebral infarction: Secondary | ICD-10-CM | POA: Diagnosis not present

## 2024-01-05 DIAGNOSIS — I69392 Facial weakness following cerebral infarction: Secondary | ICD-10-CM | POA: Diagnosis not present

## 2024-01-05 DIAGNOSIS — Z87440 Personal history of urinary (tract) infections: Secondary | ICD-10-CM | POA: Diagnosis not present

## 2024-01-05 DIAGNOSIS — C7951 Secondary malignant neoplasm of bone: Secondary | ICD-10-CM | POA: Diagnosis not present

## 2024-01-05 DIAGNOSIS — Z8546 Personal history of malignant neoplasm of prostate: Secondary | ICD-10-CM | POA: Diagnosis not present

## 2024-01-07 DIAGNOSIS — J45909 Unspecified asthma, uncomplicated: Secondary | ICD-10-CM | POA: Diagnosis not present

## 2024-01-07 DIAGNOSIS — R7989 Other specified abnormal findings of blood chemistry: Secondary | ICD-10-CM | POA: Diagnosis not present

## 2024-01-07 DIAGNOSIS — R5383 Other fatigue: Secondary | ICD-10-CM | POA: Diagnosis not present

## 2024-01-07 DIAGNOSIS — D6959 Other secondary thrombocytopenia: Secondary | ICD-10-CM | POA: Diagnosis not present

## 2024-01-07 DIAGNOSIS — R634 Abnormal weight loss: Secondary | ICD-10-CM | POA: Diagnosis not present

## 2024-01-07 DIAGNOSIS — N390 Urinary tract infection, site not specified: Secondary | ICD-10-CM | POA: Diagnosis not present

## 2024-01-07 DIAGNOSIS — Z7901 Long term (current) use of anticoagulants: Secondary | ICD-10-CM | POA: Diagnosis not present

## 2024-01-07 DIAGNOSIS — I69398 Other sequelae of cerebral infarction: Secondary | ICD-10-CM | POA: Diagnosis not present

## 2024-01-07 DIAGNOSIS — M6281 Muscle weakness (generalized): Secondary | ICD-10-CM | POA: Diagnosis not present

## 2024-01-07 DIAGNOSIS — R4701 Aphasia: Secondary | ICD-10-CM | POA: Diagnosis not present

## 2024-01-07 DIAGNOSIS — K5900 Constipation, unspecified: Secondary | ICD-10-CM | POA: Diagnosis not present

## 2024-01-07 DIAGNOSIS — R262 Difficulty in walking, not elsewhere classified: Secondary | ICD-10-CM | POA: Diagnosis not present

## 2024-01-07 DIAGNOSIS — E785 Hyperlipidemia, unspecified: Secondary | ICD-10-CM | POA: Diagnosis not present

## 2024-01-07 DIAGNOSIS — R238 Other skin changes: Secondary | ICD-10-CM | POA: Diagnosis not present

## 2024-01-07 DIAGNOSIS — M722 Plantar fascial fibromatosis: Secondary | ICD-10-CM | POA: Diagnosis not present

## 2024-01-07 DIAGNOSIS — K317 Polyp of stomach and duodenum: Secondary | ICD-10-CM | POA: Diagnosis not present

## 2024-01-07 DIAGNOSIS — D500 Iron deficiency anemia secondary to blood loss (chronic): Secondary | ICD-10-CM | POA: Diagnosis not present

## 2024-01-07 DIAGNOSIS — K449 Diaphragmatic hernia without obstruction or gangrene: Secondary | ICD-10-CM | POA: Diagnosis not present

## 2024-01-07 DIAGNOSIS — D638 Anemia in other chronic diseases classified elsewhere: Secondary | ICD-10-CM | POA: Diagnosis not present

## 2024-01-07 DIAGNOSIS — R52 Pain, unspecified: Secondary | ICD-10-CM | POA: Diagnosis not present

## 2024-01-07 DIAGNOSIS — R0602 Shortness of breath: Secondary | ICD-10-CM | POA: Diagnosis not present

## 2024-01-07 DIAGNOSIS — C679 Malignant neoplasm of bladder, unspecified: Secondary | ICD-10-CM | POA: Diagnosis not present

## 2024-01-07 DIAGNOSIS — R000 Tachycardia, unspecified: Secondary | ICD-10-CM | POA: Diagnosis not present

## 2024-01-07 DIAGNOSIS — T7840XA Allergy, unspecified, initial encounter: Secondary | ICD-10-CM | POA: Diagnosis not present

## 2024-01-07 DIAGNOSIS — I69320 Aphasia following cerebral infarction: Secondary | ICD-10-CM | POA: Diagnosis not present

## 2024-01-07 DIAGNOSIS — T45515A Adverse effect of anticoagulants, initial encounter: Secondary | ICD-10-CM | POA: Diagnosis not present

## 2024-01-07 DIAGNOSIS — Z743 Need for continuous supervision: Secondary | ICD-10-CM | POA: Diagnosis not present

## 2024-01-07 DIAGNOSIS — Z5181 Encounter for therapeutic drug level monitoring: Secondary | ICD-10-CM | POA: Diagnosis not present

## 2024-01-07 DIAGNOSIS — I10 Essential (primary) hypertension: Secondary | ICD-10-CM | POA: Diagnosis not present

## 2024-01-07 DIAGNOSIS — I999 Unspecified disorder of circulatory system: Secondary | ICD-10-CM | POA: Diagnosis not present

## 2024-01-07 DIAGNOSIS — D6832 Hemorrhagic disorder due to extrinsic circulating anticoagulants: Secondary | ICD-10-CM | POA: Diagnosis not present

## 2024-01-07 DIAGNOSIS — R58 Hemorrhage, not elsewhere classified: Secondary | ICD-10-CM | POA: Diagnosis not present

## 2024-01-07 DIAGNOSIS — Z7409 Other reduced mobility: Secondary | ICD-10-CM | POA: Diagnosis not present

## 2024-01-07 DIAGNOSIS — K921 Melena: Secondary | ICD-10-CM | POA: Diagnosis not present

## 2024-01-07 DIAGNOSIS — I493 Ventricular premature depolarization: Secondary | ICD-10-CM | POA: Diagnosis not present

## 2024-01-07 DIAGNOSIS — Z5111 Encounter for antineoplastic chemotherapy: Secondary | ICD-10-CM | POA: Diagnosis not present

## 2024-01-07 DIAGNOSIS — K922 Gastrointestinal hemorrhage, unspecified: Secondary | ICD-10-CM | POA: Diagnosis not present

## 2024-01-07 DIAGNOSIS — R627 Adult failure to thrive: Secondary | ICD-10-CM | POA: Diagnosis not present

## 2024-01-07 DIAGNOSIS — Z86718 Personal history of other venous thrombosis and embolism: Secondary | ICD-10-CM | POA: Diagnosis not present

## 2024-01-07 DIAGNOSIS — M2022 Hallux rigidus, left foot: Secondary | ICD-10-CM | POA: Diagnosis not present

## 2024-01-07 DIAGNOSIS — E569 Vitamin deficiency, unspecified: Secondary | ICD-10-CM | POA: Diagnosis not present

## 2024-01-07 DIAGNOSIS — I447 Left bundle-branch block, unspecified: Secondary | ICD-10-CM | POA: Diagnosis not present

## 2024-01-07 DIAGNOSIS — E538 Deficiency of other specified B group vitamins: Secondary | ICD-10-CM | POA: Diagnosis not present

## 2024-01-07 DIAGNOSIS — I69351 Hemiplegia and hemiparesis following cerebral infarction affecting right dominant side: Secondary | ICD-10-CM | POA: Diagnosis not present

## 2024-01-07 DIAGNOSIS — I69392 Facial weakness following cerebral infarction: Secondary | ICD-10-CM | POA: Diagnosis not present

## 2024-01-07 DIAGNOSIS — E875 Hyperkalemia: Secondary | ICD-10-CM | POA: Diagnosis not present

## 2024-01-07 DIAGNOSIS — R531 Weakness: Secondary | ICD-10-CM | POA: Diagnosis not present

## 2024-01-07 DIAGNOSIS — E860 Dehydration: Secondary | ICD-10-CM | POA: Diagnosis not present

## 2024-01-07 DIAGNOSIS — D62 Acute posthemorrhagic anemia: Secondary | ICD-10-CM | POA: Diagnosis not present

## 2024-01-07 DIAGNOSIS — K7460 Unspecified cirrhosis of liver: Secondary | ICD-10-CM | POA: Diagnosis not present

## 2024-01-07 DIAGNOSIS — I499 Cardiac arrhythmia, unspecified: Secondary | ICD-10-CM | POA: Diagnosis not present

## 2024-01-07 DIAGNOSIS — D696 Thrombocytopenia, unspecified: Secondary | ICD-10-CM | POA: Diagnosis not present

## 2024-01-07 DIAGNOSIS — C61 Malignant neoplasm of prostate: Secondary | ICD-10-CM | POA: Diagnosis not present

## 2024-01-07 DIAGNOSIS — N179 Acute kidney failure, unspecified: Secondary | ICD-10-CM | POA: Diagnosis not present

## 2024-01-07 DIAGNOSIS — K219 Gastro-esophageal reflux disease without esophagitis: Secondary | ICD-10-CM | POA: Diagnosis not present

## 2024-01-07 DIAGNOSIS — K2281 Esophageal polyp: Secondary | ICD-10-CM | POA: Diagnosis not present

## 2024-01-07 DIAGNOSIS — R1310 Dysphagia, unspecified: Secondary | ICD-10-CM | POA: Diagnosis not present

## 2024-01-07 DIAGNOSIS — N3001 Acute cystitis with hematuria: Secondary | ICD-10-CM | POA: Diagnosis not present

## 2024-01-07 DIAGNOSIS — C329 Malignant neoplasm of larynx, unspecified: Secondary | ICD-10-CM | POA: Diagnosis not present

## 2024-01-07 DIAGNOSIS — E43 Unspecified severe protein-calorie malnutrition: Secondary | ICD-10-CM | POA: Diagnosis not present

## 2024-01-07 DIAGNOSIS — K8020 Calculus of gallbladder without cholecystitis without obstruction: Secondary | ICD-10-CM | POA: Diagnosis not present

## 2024-01-07 DIAGNOSIS — D649 Anemia, unspecified: Secondary | ICD-10-CM | POA: Diagnosis not present

## 2024-01-07 DIAGNOSIS — R791 Abnormal coagulation profile: Secondary | ICD-10-CM | POA: Diagnosis not present

## 2024-01-08 DIAGNOSIS — D62 Acute posthemorrhagic anemia: Secondary | ICD-10-CM | POA: Diagnosis not present

## 2024-01-08 DIAGNOSIS — Z7901 Long term (current) use of anticoagulants: Secondary | ICD-10-CM | POA: Diagnosis not present

## 2024-01-08 DIAGNOSIS — R791 Abnormal coagulation profile: Secondary | ICD-10-CM | POA: Diagnosis not present

## 2024-01-08 DIAGNOSIS — K921 Melena: Secondary | ICD-10-CM | POA: Diagnosis not present

## 2024-01-09 DIAGNOSIS — T45515A Adverse effect of anticoagulants, initial encounter: Secondary | ICD-10-CM | POA: Diagnosis not present

## 2024-01-09 DIAGNOSIS — D649 Anemia, unspecified: Secondary | ICD-10-CM | POA: Diagnosis not present

## 2024-01-09 DIAGNOSIS — R7989 Other specified abnormal findings of blood chemistry: Secondary | ICD-10-CM | POA: Diagnosis not present

## 2024-01-09 DIAGNOSIS — D6832 Hemorrhagic disorder due to extrinsic circulating anticoagulants: Secondary | ICD-10-CM | POA: Diagnosis not present

## 2024-01-09 DIAGNOSIS — K921 Melena: Secondary | ICD-10-CM | POA: Diagnosis not present

## 2024-01-09 DIAGNOSIS — K2281 Esophageal polyp: Secondary | ICD-10-CM | POA: Diagnosis not present

## 2024-01-09 DIAGNOSIS — D62 Acute posthemorrhagic anemia: Secondary | ICD-10-CM | POA: Diagnosis not present

## 2024-01-09 DIAGNOSIS — K449 Diaphragmatic hernia without obstruction or gangrene: Secondary | ICD-10-CM | POA: Diagnosis not present

## 2024-01-09 DIAGNOSIS — D696 Thrombocytopenia, unspecified: Secondary | ICD-10-CM | POA: Diagnosis not present

## 2024-01-09 DIAGNOSIS — I10 Essential (primary) hypertension: Secondary | ICD-10-CM | POA: Diagnosis not present

## 2024-01-09 DIAGNOSIS — K317 Polyp of stomach and duodenum: Secondary | ICD-10-CM | POA: Diagnosis not present

## 2024-01-10 DIAGNOSIS — D649 Anemia, unspecified: Secondary | ICD-10-CM | POA: Diagnosis not present

## 2024-01-12 DIAGNOSIS — I69320 Aphasia following cerebral infarction: Secondary | ICD-10-CM | POA: Diagnosis not present

## 2024-01-12 DIAGNOSIS — I69392 Facial weakness following cerebral infarction: Secondary | ICD-10-CM | POA: Diagnosis not present

## 2024-01-12 DIAGNOSIS — I69351 Hemiplegia and hemiparesis following cerebral infarction affecting right dominant side: Secondary | ICD-10-CM | POA: Diagnosis not present

## 2024-01-13 DIAGNOSIS — R000 Tachycardia, unspecified: Secondary | ICD-10-CM | POA: Diagnosis not present

## 2024-01-13 DIAGNOSIS — C329 Malignant neoplasm of larynx, unspecified: Secondary | ICD-10-CM | POA: Diagnosis not present

## 2024-01-13 DIAGNOSIS — R58 Hemorrhage, not elsewhere classified: Secondary | ICD-10-CM | POA: Diagnosis not present

## 2024-01-13 DIAGNOSIS — E538 Deficiency of other specified B group vitamins: Secondary | ICD-10-CM | POA: Diagnosis not present

## 2024-01-13 DIAGNOSIS — J45909 Unspecified asthma, uncomplicated: Secondary | ICD-10-CM | POA: Diagnosis not present

## 2024-01-13 DIAGNOSIS — Z6830 Body mass index (BMI) 30.0-30.9, adult: Secondary | ICD-10-CM | POA: Diagnosis not present

## 2024-01-13 DIAGNOSIS — D696 Thrombocytopenia, unspecified: Secondary | ICD-10-CM | POA: Diagnosis not present

## 2024-01-13 DIAGNOSIS — K5900 Constipation, unspecified: Secondary | ICD-10-CM | POA: Diagnosis not present

## 2024-01-13 DIAGNOSIS — T7840XA Allergy, unspecified, initial encounter: Secondary | ICD-10-CM | POA: Diagnosis not present

## 2024-01-13 DIAGNOSIS — Z86718 Personal history of other venous thrombosis and embolism: Secondary | ICD-10-CM | POA: Diagnosis not present

## 2024-01-13 DIAGNOSIS — K921 Melena: Secondary | ICD-10-CM | POA: Diagnosis not present

## 2024-01-13 DIAGNOSIS — J9601 Acute respiratory failure with hypoxia: Secondary | ICD-10-CM | POA: Diagnosis not present

## 2024-01-13 DIAGNOSIS — N179 Acute kidney failure, unspecified: Secondary | ICD-10-CM | POA: Diagnosis not present

## 2024-01-13 DIAGNOSIS — D649 Anemia, unspecified: Secondary | ICD-10-CM | POA: Diagnosis not present

## 2024-01-13 DIAGNOSIS — I214 Non-ST elevation (NSTEMI) myocardial infarction: Secondary | ICD-10-CM | POA: Diagnosis not present

## 2024-01-13 DIAGNOSIS — I69398 Other sequelae of cerebral infarction: Secondary | ICD-10-CM | POA: Diagnosis not present

## 2024-01-13 DIAGNOSIS — R634 Abnormal weight loss: Secondary | ICD-10-CM | POA: Diagnosis not present

## 2024-01-13 DIAGNOSIS — I110 Hypertensive heart disease with heart failure: Secondary | ICD-10-CM | POA: Diagnosis not present

## 2024-01-13 DIAGNOSIS — R059 Cough, unspecified: Secondary | ICD-10-CM | POA: Diagnosis not present

## 2024-01-13 DIAGNOSIS — R0602 Shortness of breath: Secondary | ICD-10-CM | POA: Diagnosis not present

## 2024-01-13 DIAGNOSIS — D62 Acute posthemorrhagic anemia: Secondary | ICD-10-CM | POA: Diagnosis not present

## 2024-01-13 DIAGNOSIS — I499 Cardiac arrhythmia, unspecified: Secondary | ICD-10-CM | POA: Diagnosis not present

## 2024-01-13 DIAGNOSIS — D500 Iron deficiency anemia secondary to blood loss (chronic): Secondary | ICD-10-CM | POA: Diagnosis not present

## 2024-01-13 DIAGNOSIS — R5383 Other fatigue: Secondary | ICD-10-CM | POA: Diagnosis not present

## 2024-01-13 DIAGNOSIS — R238 Other skin changes: Secondary | ICD-10-CM | POA: Diagnosis not present

## 2024-01-13 DIAGNOSIS — E873 Alkalosis: Secondary | ICD-10-CM | POA: Diagnosis not present

## 2024-01-13 DIAGNOSIS — R627 Adult failure to thrive: Secondary | ICD-10-CM | POA: Diagnosis not present

## 2024-01-13 DIAGNOSIS — M6281 Muscle weakness (generalized): Secondary | ICD-10-CM | POA: Diagnosis not present

## 2024-01-13 DIAGNOSIS — Z20822 Contact with and (suspected) exposure to covid-19: Secondary | ICD-10-CM | POA: Diagnosis not present

## 2024-01-13 DIAGNOSIS — Z743 Need for continuous supervision: Secondary | ICD-10-CM | POA: Diagnosis not present

## 2024-01-13 DIAGNOSIS — R52 Pain, unspecified: Secondary | ICD-10-CM | POA: Diagnosis not present

## 2024-01-13 DIAGNOSIS — Z515 Encounter for palliative care: Secondary | ICD-10-CM | POA: Diagnosis not present

## 2024-01-13 DIAGNOSIS — I493 Ventricular premature depolarization: Secondary | ICD-10-CM | POA: Diagnosis not present

## 2024-01-13 DIAGNOSIS — I69333 Monoplegia of upper limb following cerebral infarction affecting right non-dominant side: Secondary | ICD-10-CM | POA: Diagnosis not present

## 2024-01-13 DIAGNOSIS — I2693 Single subsegmental pulmonary embolism without acute cor pulmonale: Secondary | ICD-10-CM | POA: Diagnosis not present

## 2024-01-13 DIAGNOSIS — I447 Left bundle-branch block, unspecified: Secondary | ICD-10-CM | POA: Diagnosis not present

## 2024-01-13 DIAGNOSIS — K922 Gastrointestinal hemorrhage, unspecified: Secondary | ICD-10-CM | POA: Diagnosis not present

## 2024-01-13 DIAGNOSIS — C679 Malignant neoplasm of bladder, unspecified: Secondary | ICD-10-CM | POA: Diagnosis not present

## 2024-01-13 DIAGNOSIS — E43 Unspecified severe protein-calorie malnutrition: Secondary | ICD-10-CM | POA: Diagnosis not present

## 2024-01-13 DIAGNOSIS — Z8551 Personal history of malignant neoplasm of bladder: Secondary | ICD-10-CM | POA: Diagnosis not present

## 2024-01-13 DIAGNOSIS — N390 Urinary tract infection, site not specified: Secondary | ICD-10-CM | POA: Diagnosis not present

## 2024-01-13 DIAGNOSIS — Z8546 Personal history of malignant neoplasm of prostate: Secondary | ICD-10-CM | POA: Diagnosis not present

## 2024-01-13 DIAGNOSIS — M722 Plantar fascial fibromatosis: Secondary | ICD-10-CM | POA: Diagnosis not present

## 2024-01-13 DIAGNOSIS — I639 Cerebral infarction, unspecified: Secondary | ICD-10-CM | POA: Diagnosis not present

## 2024-01-13 DIAGNOSIS — Z5111 Encounter for antineoplastic chemotherapy: Secondary | ICD-10-CM | POA: Diagnosis not present

## 2024-01-13 DIAGNOSIS — I999 Unspecified disorder of circulatory system: Secondary | ICD-10-CM | POA: Diagnosis not present

## 2024-01-13 DIAGNOSIS — D509 Iron deficiency anemia, unspecified: Secondary | ICD-10-CM | POA: Diagnosis not present

## 2024-01-13 DIAGNOSIS — R531 Weakness: Secondary | ICD-10-CM | POA: Diagnosis not present

## 2024-01-13 DIAGNOSIS — Z5181 Encounter for therapeutic drug level monitoring: Secondary | ICD-10-CM | POA: Diagnosis not present

## 2024-01-13 DIAGNOSIS — I5023 Acute on chronic systolic (congestive) heart failure: Secondary | ICD-10-CM | POA: Diagnosis not present

## 2024-01-13 DIAGNOSIS — E569 Vitamin deficiency, unspecified: Secondary | ICD-10-CM | POA: Diagnosis not present

## 2024-01-13 DIAGNOSIS — E785 Hyperlipidemia, unspecified: Secondary | ICD-10-CM | POA: Diagnosis not present

## 2024-01-13 DIAGNOSIS — I2699 Other pulmonary embolism without acute cor pulmonale: Secondary | ICD-10-CM | POA: Diagnosis not present

## 2024-01-13 DIAGNOSIS — C61 Malignant neoplasm of prostate: Secondary | ICD-10-CM | POA: Diagnosis not present

## 2024-01-13 DIAGNOSIS — I469 Cardiac arrest, cause unspecified: Secondary | ICD-10-CM | POA: Diagnosis not present

## 2024-01-13 DIAGNOSIS — R042 Hemoptysis: Secondary | ICD-10-CM | POA: Diagnosis not present

## 2024-01-13 DIAGNOSIS — Z66 Do not resuscitate: Secondary | ICD-10-CM | POA: Diagnosis not present

## 2024-01-13 DIAGNOSIS — I4891 Unspecified atrial fibrillation: Secondary | ICD-10-CM | POA: Diagnosis not present

## 2024-01-13 DIAGNOSIS — I471 Supraventricular tachycardia, unspecified: Secondary | ICD-10-CM | POA: Diagnosis not present

## 2024-01-13 DIAGNOSIS — Z7901 Long term (current) use of anticoagulants: Secondary | ICD-10-CM | POA: Diagnosis not present

## 2024-01-13 DIAGNOSIS — N3001 Acute cystitis with hematuria: Secondary | ICD-10-CM | POA: Diagnosis not present

## 2024-01-13 DIAGNOSIS — M2022 Hallux rigidus, left foot: Secondary | ICD-10-CM | POA: Diagnosis not present

## 2024-01-13 DIAGNOSIS — Z7409 Other reduced mobility: Secondary | ICD-10-CM | POA: Diagnosis not present

## 2024-01-16 ENCOUNTER — Ambulatory Visit: Admitting: Oncology

## 2024-01-16 ENCOUNTER — Other Ambulatory Visit

## 2024-01-16 DIAGNOSIS — D696 Thrombocytopenia, unspecified: Secondary | ICD-10-CM | POA: Diagnosis not present

## 2024-01-16 DIAGNOSIS — C329 Malignant neoplasm of larynx, unspecified: Secondary | ICD-10-CM | POA: Diagnosis not present

## 2024-01-16 DIAGNOSIS — C679 Malignant neoplasm of bladder, unspecified: Secondary | ICD-10-CM | POA: Diagnosis not present

## 2024-01-16 DIAGNOSIS — E785 Hyperlipidemia, unspecified: Secondary | ICD-10-CM | POA: Diagnosis not present

## 2024-01-16 DIAGNOSIS — Z5181 Encounter for therapeutic drug level monitoring: Secondary | ICD-10-CM | POA: Diagnosis not present

## 2024-01-16 DIAGNOSIS — Z7901 Long term (current) use of anticoagulants: Secondary | ICD-10-CM | POA: Diagnosis not present

## 2024-01-16 DIAGNOSIS — K922 Gastrointestinal hemorrhage, unspecified: Secondary | ICD-10-CM | POA: Diagnosis not present

## 2024-01-16 DIAGNOSIS — Z86718 Personal history of other venous thrombosis and embolism: Secondary | ICD-10-CM | POA: Diagnosis not present

## 2024-01-17 DIAGNOSIS — D62 Acute posthemorrhagic anemia: Secondary | ICD-10-CM | POA: Diagnosis not present

## 2024-01-17 DIAGNOSIS — N179 Acute kidney failure, unspecified: Secondary | ICD-10-CM | POA: Diagnosis not present

## 2024-01-17 DIAGNOSIS — D509 Iron deficiency anemia, unspecified: Secondary | ICD-10-CM | POA: Diagnosis not present

## 2024-01-17 DIAGNOSIS — Z8551 Personal history of malignant neoplasm of bladder: Secondary | ICD-10-CM | POA: Diagnosis not present

## 2024-01-17 DIAGNOSIS — I110 Hypertensive heart disease with heart failure: Secondary | ICD-10-CM | POA: Diagnosis not present

## 2024-01-17 DIAGNOSIS — I447 Left bundle-branch block, unspecified: Secondary | ICD-10-CM | POA: Diagnosis not present

## 2024-01-17 DIAGNOSIS — Z66 Do not resuscitate: Secondary | ICD-10-CM | POA: Diagnosis not present

## 2024-01-17 DIAGNOSIS — J810 Acute pulmonary edema: Secondary | ICD-10-CM | POA: Diagnosis not present

## 2024-01-17 DIAGNOSIS — I214 Non-ST elevation (NSTEMI) myocardial infarction: Secondary | ICD-10-CM | POA: Diagnosis not present

## 2024-01-17 DIAGNOSIS — J9600 Acute respiratory failure, unspecified whether with hypoxia or hypercapnia: Secondary | ICD-10-CM | POA: Diagnosis not present

## 2024-01-17 DIAGNOSIS — D649 Anemia, unspecified: Secondary | ICD-10-CM | POA: Diagnosis not present

## 2024-01-17 DIAGNOSIS — J45909 Unspecified asthma, uncomplicated: Secondary | ICD-10-CM | POA: Diagnosis not present

## 2024-01-17 DIAGNOSIS — R042 Hemoptysis: Secondary | ICD-10-CM | POA: Diagnosis not present

## 2024-01-17 DIAGNOSIS — I5023 Acute on chronic systolic (congestive) heart failure: Secondary | ICD-10-CM | POA: Diagnosis not present

## 2024-01-17 DIAGNOSIS — Z6830 Body mass index (BMI) 30.0-30.9, adult: Secondary | ICD-10-CM | POA: Diagnosis not present

## 2024-01-17 DIAGNOSIS — Z743 Need for continuous supervision: Secondary | ICD-10-CM | POA: Diagnosis not present

## 2024-01-17 DIAGNOSIS — I4891 Unspecified atrial fibrillation: Secondary | ICD-10-CM | POA: Diagnosis not present

## 2024-01-17 DIAGNOSIS — J9601 Acute respiratory failure with hypoxia: Secondary | ICD-10-CM | POA: Diagnosis not present

## 2024-01-17 DIAGNOSIS — Z8546 Personal history of malignant neoplasm of prostate: Secondary | ICD-10-CM | POA: Diagnosis not present

## 2024-01-17 DIAGNOSIS — E875 Hyperkalemia: Secondary | ICD-10-CM | POA: Diagnosis not present

## 2024-01-17 DIAGNOSIS — Z515 Encounter for palliative care: Secondary | ICD-10-CM | POA: Diagnosis not present

## 2024-01-17 DIAGNOSIS — R633 Feeding difficulties, unspecified: Secondary | ICD-10-CM | POA: Diagnosis not present

## 2024-01-17 DIAGNOSIS — Z7901 Long term (current) use of anticoagulants: Secondary | ICD-10-CM | POA: Diagnosis not present

## 2024-01-17 DIAGNOSIS — I2699 Other pulmonary embolism without acute cor pulmonale: Secondary | ICD-10-CM | POA: Diagnosis not present

## 2024-01-17 DIAGNOSIS — K921 Melena: Secondary | ICD-10-CM | POA: Diagnosis not present

## 2024-01-17 DIAGNOSIS — I471 Supraventricular tachycardia, unspecified: Secondary | ICD-10-CM | POA: Diagnosis not present

## 2024-01-17 DIAGNOSIS — R918 Other nonspecific abnormal finding of lung field: Secondary | ICD-10-CM | POA: Diagnosis not present

## 2024-01-17 DIAGNOSIS — R059 Cough, unspecified: Secondary | ICD-10-CM | POA: Diagnosis not present

## 2024-01-17 DIAGNOSIS — R58 Hemorrhage, not elsewhere classified: Secondary | ICD-10-CM | POA: Diagnosis not present

## 2024-01-17 DIAGNOSIS — I2693 Single subsegmental pulmonary embolism without acute cor pulmonale: Secondary | ICD-10-CM | POA: Diagnosis not present

## 2024-01-17 DIAGNOSIS — J90 Pleural effusion, not elsewhere classified: Secondary | ICD-10-CM | POA: Diagnosis not present

## 2024-01-17 DIAGNOSIS — I469 Cardiac arrest, cause unspecified: Secondary | ICD-10-CM | POA: Diagnosis not present

## 2024-01-17 DIAGNOSIS — I69333 Monoplegia of upper limb following cerebral infarction affecting right non-dominant side: Secondary | ICD-10-CM | POA: Diagnosis not present

## 2024-01-17 DIAGNOSIS — E873 Alkalosis: Secondary | ICD-10-CM | POA: Diagnosis not present

## 2024-01-17 DIAGNOSIS — D696 Thrombocytopenia, unspecified: Secondary | ICD-10-CM | POA: Diagnosis not present

## 2024-01-17 DIAGNOSIS — R0602 Shortness of breath: Secondary | ICD-10-CM | POA: Diagnosis not present

## 2024-01-17 DIAGNOSIS — Z20822 Contact with and (suspected) exposure to covid-19: Secondary | ICD-10-CM | POA: Diagnosis not present

## 2024-01-17 DIAGNOSIS — R1312 Dysphagia, oropharyngeal phase: Secondary | ICD-10-CM | POA: Diagnosis not present

## 2024-01-17 DIAGNOSIS — I493 Ventricular premature depolarization: Secondary | ICD-10-CM | POA: Diagnosis not present

## 2024-01-17 DIAGNOSIS — E43 Unspecified severe protein-calorie malnutrition: Secondary | ICD-10-CM | POA: Diagnosis not present

## 2024-01-17 DIAGNOSIS — I639 Cerebral infarction, unspecified: Secondary | ICD-10-CM | POA: Diagnosis not present

## 2024-01-27 ENCOUNTER — Telehealth: Payer: Self-pay | Admitting: Cardiology

## 2024-01-27 NOTE — Telephone Encounter (Signed)
 FYI Patients wife would like to let you know that her husband is in the hospital in [facility] at [facility] [employer]. He's had a stroke and heart attack. CB # [phone]

## 2024-01-30 ENCOUNTER — Ambulatory Visit: Admitting: Cardiology

## 2024-02-01 ENCOUNTER — Telehealth: Payer: Self-pay | Admitting: Neurology

## 2024-02-01 NOTE — Telephone Encounter (Signed)
 According to wife pt has passed

## 2024-02-13 ENCOUNTER — Inpatient Hospital Stay: Admitting: Neurology

## 2024-02-15 ENCOUNTER — Ambulatory Visit: Admitting: Cardiology

## 2024-02-18 DEATH — deceased

## 2024-02-21 ENCOUNTER — Ambulatory Visit: Admitting: Cardiology
